# Patient Record
Sex: Male | Born: 1935 | Race: White | Hispanic: No | Smoking: Former smoker
Health system: Southern US, Community
[De-identification: ages and names within clinical notes are randomized; demographics above are authoritative.]

## PROBLEM LIST (undated history)

## (undated) DIAGNOSIS — H3530 Unspecified macular degeneration: Secondary | ICD-10-CM

## (undated) DIAGNOSIS — N189 Chronic kidney disease, unspecified: Secondary | ICD-10-CM

## (undated) DIAGNOSIS — N400 Enlarged prostate without lower urinary tract symptoms: Secondary | ICD-10-CM

## (undated) DIAGNOSIS — J449 Chronic obstructive pulmonary disease, unspecified: Secondary | ICD-10-CM

## (undated) DIAGNOSIS — M889 Osteitis deformans of unspecified bone: Secondary | ICD-10-CM

## (undated) DIAGNOSIS — M069 Rheumatoid arthritis, unspecified: Secondary | ICD-10-CM

## (undated) DIAGNOSIS — G459 Transient cerebral ischemic attack, unspecified: Secondary | ICD-10-CM

## (undated) DIAGNOSIS — G629 Polyneuropathy, unspecified: Secondary | ICD-10-CM

## (undated) DIAGNOSIS — I7100 Dissection of unspecified site of aorta: Secondary | ICD-10-CM

## (undated) DIAGNOSIS — M1990 Unspecified osteoarthritis, unspecified site: Secondary | ICD-10-CM

## (undated) DIAGNOSIS — I4821 Permanent atrial fibrillation: Secondary | ICD-10-CM

## (undated) DIAGNOSIS — E785 Hyperlipidemia, unspecified: Secondary | ICD-10-CM

## (undated) DIAGNOSIS — D469 Myelodysplastic syndrome, unspecified: Secondary | ICD-10-CM

## (undated) DIAGNOSIS — M5030 Other cervical disc degeneration, unspecified cervical region: Secondary | ICD-10-CM

## (undated) DIAGNOSIS — I10 Essential (primary) hypertension: Secondary | ICD-10-CM

## (undated) DIAGNOSIS — H35039 Hypertensive retinopathy, unspecified eye: Secondary | ICD-10-CM

## (undated) DIAGNOSIS — K3189 Other diseases of stomach and duodenum: Secondary | ICD-10-CM

## (undated) DIAGNOSIS — K219 Gastro-esophageal reflux disease without esophagitis: Secondary | ICD-10-CM

## (undated) DIAGNOSIS — I719 Aortic aneurysm of unspecified site, without rupture: Secondary | ICD-10-CM

## (undated) DIAGNOSIS — I82409 Acute embolism and thrombosis of unspecified deep veins of unspecified lower extremity: Secondary | ICD-10-CM

## (undated) DIAGNOSIS — H3410 Central retinal artery occlusion, unspecified eye: Secondary | ICD-10-CM

## (undated) DIAGNOSIS — R760 Raised antibody titer: Secondary | ICD-10-CM

## (undated) DIAGNOSIS — D649 Anemia, unspecified: Secondary | ICD-10-CM

## (undated) DIAGNOSIS — E039 Hypothyroidism, unspecified: Secondary | ICD-10-CM

## (undated) DIAGNOSIS — M109 Gout, unspecified: Secondary | ICD-10-CM

## (undated) DIAGNOSIS — R001 Bradycardia, unspecified: Secondary | ICD-10-CM

## (undated) HISTORY — DX: Hypothyroidism, unspecified: E03.9

## (undated) HISTORY — PX: EYE SURGERY: SHX253

## (undated) HISTORY — DX: Unspecified macular degeneration: H35.30

## (undated) HISTORY — DX: Myelodysplastic syndrome, unspecified: D46.9

## (undated) HISTORY — DX: Essential (primary) hypertension: I10

## (undated) HISTORY — PX: BACK SURGERY: SHX140

## (undated) HISTORY — DX: Transient cerebral ischemic attack, unspecified: G45.9

## (undated) HISTORY — DX: Other diseases of stomach and duodenum: K31.89

## (undated) HISTORY — DX: Chronic kidney disease, unspecified: N18.9

## (undated) HISTORY — DX: Bradycardia, unspecified: R00.1

## (undated) HISTORY — DX: Unspecified osteoarthritis, unspecified site: M19.90

## (undated) HISTORY — DX: Osteitis deformans of unspecified bone: M88.9

## (undated) HISTORY — DX: Anemia, unspecified: D64.9

## (undated) HISTORY — DX: Dissection of unspecified site of aorta: I71.00

## (undated) HISTORY — DX: Gout, unspecified: M10.9

## (undated) HISTORY — DX: Aortic aneurysm of unspecified site, without rupture: I71.9

## (undated) HISTORY — DX: Hyperlipidemia, unspecified: E78.5

## (undated) HISTORY — DX: Acute embolism and thrombosis of unspecified deep veins of unspecified lower extremity: I82.409

## (undated) HISTORY — DX: Gastro-esophageal reflux disease without esophagitis: K21.9

## (undated) HISTORY — DX: Polyneuropathy, unspecified: G62.9

## (undated) HISTORY — DX: Other cervical disc degeneration, unspecified cervical region: M50.30

## (undated) HISTORY — DX: Benign prostatic hyperplasia without lower urinary tract symptoms: N40.0

## (undated) HISTORY — PX: LUNG DECORTICATION: SHX454

## (undated) HISTORY — DX: Central retinal artery occlusion, unspecified eye: H34.10

## (undated) HISTORY — DX: Chronic obstructive pulmonary disease, unspecified: J44.9

## (undated) HISTORY — PX: CAROTID STENT: SHX1301

## (undated) HISTORY — PX: CAROTID ENDARTERECTOMY: SUR193

## (undated) HISTORY — DX: Permanent atrial fibrillation: I48.21

## (undated) HISTORY — DX: Rheumatoid arthritis, unspecified: M06.9

## (undated) HISTORY — DX: Raised antibody titer: R76.0

## (undated) HISTORY — DX: Hypertensive retinopathy, unspecified eye: H35.039

---

## 1988-09-07 HISTORY — PX: CAROTID ENDARTERECTOMY: SUR193

## 2002-09-07 HISTORY — PX: PACEMAKER IMPLANT: EP1218

## 2008-09-07 HISTORY — PX: EYE SURGERY: SHX253

## 2008-09-07 HISTORY — PX: CATARACT EXTRACTION: SUR2

## 2011-12-03 HISTORY — PX: PACEMAKER GENERATOR CHANGE: SHX5998

## 2014-09-23 HISTORY — PX: ATRIAL FIBRILLATION ABLATION: EP1191

## 2018-07-27 DIAGNOSIS — I482 Chronic atrial fibrillation, unspecified: Secondary | ICD-10-CM | POA: Insufficient documentation

## 2018-07-27 DIAGNOSIS — I6529 Occlusion and stenosis of unspecified carotid artery: Secondary | ICD-10-CM | POA: Insufficient documentation

## 2018-07-27 DIAGNOSIS — M069 Rheumatoid arthritis, unspecified: Secondary | ICD-10-CM | POA: Insufficient documentation

## 2018-07-27 DIAGNOSIS — N400 Enlarged prostate without lower urinary tract symptoms: Secondary | ICD-10-CM | POA: Insufficient documentation

## 2018-07-27 DIAGNOSIS — I480 Paroxysmal atrial fibrillation: Secondary | ICD-10-CM | POA: Insufficient documentation

## 2018-07-27 DIAGNOSIS — E039 Hypothyroidism, unspecified: Secondary | ICD-10-CM | POA: Insufficient documentation

## 2018-07-27 DIAGNOSIS — M109 Gout, unspecified: Secondary | ICD-10-CM | POA: Insufficient documentation

## 2018-07-27 DIAGNOSIS — E785 Hyperlipidemia, unspecified: Secondary | ICD-10-CM | POA: Insufficient documentation

## 2018-07-27 DIAGNOSIS — H3530 Unspecified macular degeneration: Secondary | ICD-10-CM | POA: Insufficient documentation

## 2018-07-27 DIAGNOSIS — D649 Anemia, unspecified: Secondary | ICD-10-CM | POA: Insufficient documentation

## 2018-07-27 DIAGNOSIS — G629 Polyneuropathy, unspecified: Secondary | ICD-10-CM | POA: Insufficient documentation

## 2018-07-27 DIAGNOSIS — I10 Essential (primary) hypertension: Secondary | ICD-10-CM | POA: Insufficient documentation

## 2018-07-28 DIAGNOSIS — N1831 Chronic kidney disease, stage 3a: Secondary | ICD-10-CM | POA: Insufficient documentation

## 2018-07-28 DIAGNOSIS — N189 Chronic kidney disease, unspecified: Secondary | ICD-10-CM | POA: Insufficient documentation

## 2018-07-28 DIAGNOSIS — N184 Chronic kidney disease, stage 4 (severe): Secondary | ICD-10-CM | POA: Insufficient documentation

## 2018-07-29 ENCOUNTER — Other Ambulatory Visit: Payer: Self-pay | Admitting: Internal Medicine

## 2018-07-29 DIAGNOSIS — I712 Thoracic aortic aneurysm, without rupture, unspecified: Secondary | ICD-10-CM

## 2018-08-01 ENCOUNTER — Telehealth: Payer: Self-pay

## 2018-08-01 NOTE — Telephone Encounter (Signed)
Left message for 13-hour prep.  Prednisone 50mg  PO 08/09/18 @ 0001, 0600 and 1200.  Benadryl 50mg  PO 08/09/18 @ 1200.  LMOM for patient to let him know Rx called in.  Gypsy Lore, RN

## 2018-08-08 ENCOUNTER — Telehealth: Payer: Self-pay | Admitting: Nurse Practitioner

## 2018-08-08 NOTE — Telephone Encounter (Signed)
Phone call from patient, he is unaware of 13 hr prep instructions, did not receive voicemail from [place], [place] on 11/25 and reports the prescription is not at his pharmacy. Instructions for 13 hr prep reviewed with patient and Rx re-called into [facility] on [address]. Pt verbalized understanding of instructions documented in Jeanne's previous note.

## 2018-08-09 ENCOUNTER — Ambulatory Visit
Admission: RE | Admit: 2018-08-09 | Discharge: 2018-08-09 | Disposition: A | Discharge status: Home / Self Care | Payer: Medicare Other | Source: Ambulatory Visit | Attending: Internal Medicine | Admitting: Internal Medicine

## 2018-08-09 ENCOUNTER — Encounter: Payer: Self-pay | Admitting: Cardiology

## 2018-08-09 ENCOUNTER — Other Ambulatory Visit: Payer: Self-pay | Admitting: Internal Medicine

## 2018-08-09 DIAGNOSIS — I712 Thoracic aortic aneurysm, without rupture, unspecified: Secondary | ICD-10-CM

## 2018-08-09 MED ORDER — IOPAMIDOL (ISOVUE-370) INJECTION 76%
50.0000 mL | Freq: Once | INTRAVENOUS | Status: AC | PRN
  Administered 2018-08-09: 50 mL via INTRAVENOUS

## 2018-08-10 DIAGNOSIS — I2510 Atherosclerotic heart disease of native coronary artery without angina pectoris: Secondary | ICD-10-CM | POA: Insufficient documentation

## 2018-08-26 DIAGNOSIS — C4441 Basal cell carcinoma of skin of scalp and neck: Secondary | ICD-10-CM | POA: Insufficient documentation

## 2018-08-29 ENCOUNTER — Encounter: Payer: Self-pay | Admitting: Internal Medicine

## 2018-08-29 ENCOUNTER — Ambulatory Visit (INDEPENDENT_AMBULATORY_CARE_PROVIDER_SITE_OTHER): Payer: Medicare Other | Admitting: Internal Medicine

## 2018-08-29 VITALS — BP 124/86 | HR 71 | Ht 73.0 in | Wt 191.0 lb

## 2018-08-29 DIAGNOSIS — Z950 Presence of cardiac pacemaker: Secondary | ICD-10-CM

## 2018-08-29 DIAGNOSIS — I4821 Permanent atrial fibrillation: Secondary | ICD-10-CM | POA: Diagnosis not present

## 2018-08-29 DIAGNOSIS — I10 Essential (primary) hypertension: Secondary | ICD-10-CM

## 2018-08-29 DIAGNOSIS — I340 Nonrheumatic mitral (valve) insufficiency: Secondary | ICD-10-CM

## 2018-08-29 DIAGNOSIS — R001 Bradycardia, unspecified: Secondary | ICD-10-CM

## 2018-08-29 LAB — CUP PACEART INCLINIC DEVICE CHECK
Battery Impedance: 1382 Ohm
Battery Remaining Longevity: 53 mo
Battery Voltage: 2.76 V
Brady Statistic RV Percent Paced: 5 %
Date Time Interrogation Session: 20191223143121
Implantable Lead Implant Date: 20041008
Implantable Lead Location: 753859
Implantable Lead Location: 753860
Implantable Lead Model: 5076
Implantable Lead Model: 5076
Implantable Pulse Generator Implant Date: 20130328
Lead Channel Impedance Value: 67 Ohm
Lead Channel Impedance Value: 786 Ohm
Lead Channel Setting Pacing Amplitude: 2.5 V
Lead Channel Setting Pacing Pulse Width: 1 ms
Lead Channel Setting Sensing Sensitivity: 4 mV
MDC IDC LEAD IMPLANT DT: 20041008
MDC IDC MSMT LEADCHNL RV PACING THRESHOLD AMPLITUDE: 1.25 V
MDC IDC MSMT LEADCHNL RV PACING THRESHOLD PULSEWIDTH: 1 ms
MDC IDC MSMT LEADCHNL RV SENSING INTR AMPL: 11.2 mV

## 2018-08-29 NOTE — Patient Instructions (Addendum)
Medication Instructions:  Your physician recommends that you continue on your current medications as directed. Please refer to the Current Medication list given to you today.  Phone number for the device clinic if you any questions is [phone]  Labwork: None ordered.  Testing/Procedures: Your physician has requested that you have an echocardiogram. Echocardiography is a painless test that uses sound waves to create images of your heart. It provides your doctor with information about the size and shape of your heart and how well your heart's chambers and valves are working. This procedure takes approximately one hour. There are no restrictions for this procedure.  Please schedule for ECHO   Follow-Up: Your physician wants you to follow-up in: 6 months with Tommye Standard, PA.   You will receive a reminder letter in the mail two months in advance. If you don't receive a letter, please call our office to schedule the follow-up appointment.  Remote monitoring is used to monitor your Pacemaker from home. This monitoring reduces the number of office visits required to check your device to one time per year. It allows [place] to keep an eye on the functioning of your device to ensure it is working properly. You are scheduled for a device check from home on 11/28/2018. You may send your transmission at any time that day. If you have a wireless device, the transmission will be sent automatically. After your physician reviews your transmission, you will receive a postcard with your next transmission date.  Any Other Special Instructions Will Be Listed Below (If Applicable).  If you need a refill on your cardiac medications before your next appointment, please call your pharmacy.

## 2018-08-29 NOTE — Progress Notes (Signed)
Steven Redwood, MD: Primary EP:  Dr Encarnacion Chu ([facility])  Steven Williams is a 84 y.o. male with a h/o bradycardia sp PPM (MDT) by Dr Encarnacion Chu who presents today to establish care in the Electrophysiology device clinic.  He has permanent afib with symptomatic bradycardia.  He has rare palpitations.  He is on coumadin.  Walks with a cane.  Rare dizziness. The patient reports doing very well since having a pacemaker implanted and remains very active despite his age.   Today, he  denies symptoms of palpitations, chest pain, shortness of breath, orthopnea, PND, lower extremity edema, dizziness, presyncope, syncope, or neurologic sequela.  The patientis tolerating medications without difficulties and is otherwise without complaint today.   Past Medical History:  Diagnosis Date  . Antiphospholipid antibody positive   . Aortic aneurysm ([place])   . Central retinal artery occlusion   . Chronic anemia   . Chronic renal insufficiency   . COPD (chronic obstructive pulmonary disease) ([place])   . DDD (degenerative disc disease), cervical   . DJD (degenerative joint disease)   . DVT (deep venous thrombosis) ([place])   . Gastric dysmotility   . GERD (gastroesophageal reflux disease)   . Gout   . HTN (hypertension)   . Hyperlipidemia   . Myelodysplasia (myelodysplastic syndrome) ([place])   . Paget's disease of bone   . Permanent atrial fibrillation   . Rheumatoid arthritis ([place])   . Symptomatic bradycardia   . TIA (transient ischemic attack)    Past Surgical History:  Procedure Laterality Date  . ATRIAL FIBRILLATION ABLATION  09/23/2014   Dr Encarnacion Chu in [place]  . BACK SURGERY    . CAROTID ENDARTERECTOMY    . CAROTID STENT    . LUNG DECORTICATION     VATS  . PACEMAKER GENERATOR CHANGE  12/03/2011   MDT Adapta ADDR01 generator change by Dr Encarnacion Chu for symptomatic bradycardia  . PACEMAKER IMPLANT  2004   MDT     Social History   Socioeconomic History  .  Marital status: Widowed    Spouse name: Not on file  . Number of children: Not on file  . Years of education: Not on file  . Highest education level: Not on file  Occupational History  . Not on file  Social Needs  . Financial resource strain: Not on file  . Food insecurity:    Worry: Not on file    Inability: Not on file  . Transportation needs:    Medical: Not on file    Non-medical: Not on file  Tobacco Use  . Smoking status: Former Smoker    Types: Cigarettes    Last attempt to quit: 1961    Years since quitting: 59.0  . Smokeless tobacco: Never Used  Substance and Sexual Activity  . Alcohol use: Not on file  . Drug use: Not on file  . Sexual activity: Not on file  Lifestyle  . Physical activity:    Days per week: Not on file    Minutes per session: Not on file  . Stress: Not on file  Relationships  . Social connections:    Talks on phone: Not on file    Gets together: Not on file    Attends religious service: Not on file    Active member of club or organization: Not on file    Attends meetings of clubs or organizations: Not on file    Relationship status: Not on file  . Intimate partner  violence:    Fear of current or ex partner: Not on file    Emotionally abused: Not on file    Physically abused: Not on file    Forced sexual activity: Not on file  Other Topics Concern  . Not on file  Social History Narrative   Previously lived in [place]   Now lives in [place] ([place])   Retired Chief Financial Officer    History reviewed. No pertinent family history.  Allergies  Allergen Reactions  . Iodinated Diagnostic Agents Rash    Current Outpatient Medications  Medication Sig Dispense Refill  . allopurinol (ZYLOPRIM) 300 MG tablet Take 300 mg by mouth daily.    . carvedilol (COREG) 25 MG tablet Take 25 mg by mouth 2 (two) times daily with a meal.    . Certolizumab Pegol (CIMZIA [place]) Inject into the skin every 30 (thirty) days. Patient is unsure of dose    .  Cholecalciferol (VITAMIN D3) 50 MCG (2000 UT) TABS Take 1 tablet by mouth 2 (two) times daily.    . digoxin (LANOXIN) 0.125 MG tablet Take 0.125 mg by mouth as directed. Patient takes medication on M/W/F    . Ferrous Sulfate (IRON) 325 (65 Fe) MG TABS Take 1 tablet by mouth 2 (two) times daily.    . furosemide (LASIX) 40 MG tablet Take 40 mg by mouth daily.    Marland Kitchen gabapentin (NEURONTIN) 400 MG capsule Take 400 mg by mouth at bedtime.    Marland Kitchen levothyroxine (SYNTHROID, LEVOTHROID) 88 MCG tablet Take 88 mcg by mouth daily before breakfast.    . Misc Natural Products (GLUCOSAMINE CHOND COMPLEX/MSM PO) Take 1 tablet by mouth daily.    . Multiple Vitamins-Minerals (PRESERVISION AREDS PO) Take 1 tablet by mouth 2 (two) times daily.    . Ranibizumab (LUCENTIS IZ) by Intravitreal route every 30 (thirty) days. Patient is unsure of dose    . rosuvastatin (CRESTOR) 10 MG tablet Take 10 mg by mouth daily.    . saw palmetto 500 MG capsule Take 500 mg by mouth 2 (two) times daily.    . tamsulosin (FLOMAX) 0.4 MG CAPS capsule Take 0.4 mg by mouth daily.    . temazepam (RESTORIL) 15 MG capsule Take 15 mg by mouth at bedtime as needed for sleep.    . Turmeric 500 MG CAPS Take 1 capsule by mouth daily.    Marland Kitchen warfarin (COUMADIN) 2 MG tablet Take 2 mg by mouth daily.     No current facility-administered medications for this visit.     ROS- all systems are reviewed and negative except as per HPI  Physical Exam: Vitals:   08/29/18 1158  BP: 124/86  Pulse: 71  SpO2: 99%  Weight: 191 lb (86.6 kg)  Height: 6\' 1"  (1.854 m)    GEN- The patient is elderly appearing, alert and oriented x 3 today.   Head- normocephalic, atraumatic Eyes-  Sclera clear, conjunctiva pink Ears- hearing intact Oropharynx- clear Neck- supple,   Lungs- Clear to ausculation bilaterally, normal work of breathing Chest- pacemaker pocket is well healed Heart- Regular rate and rhythm, 3/6 SEM at the LLSB, 3/6 holosystolic murmur at the  apex GI- soft, NT, ND, + BS Extremities- no clubbing, cyanosis, or edema MS- age appropriate atrophy Skin- no rash or lesion Psych- euthymic mood, full affect Neuro- strength and sensation are intact  Pacemaker interrogation- reviewed in detail today,  See PACEART report  Assessment and Plan:   1. Symptomatic bradycardia Normal pacemaker function See Claudia Desanctis Art report  He is programmed VVI 55 bpm RV output is chronically programmed 2.5V@1  msec due to chronically elevated threshold (1.25V@1msec  today) He V paces 5.2% RV lead polarity switch is turned to adaptive today.  No other changes  2. Permanent afib Rate controlled, with occasional elevation No changes today chads2vasc score is at least 6.  He is on coumadin.  INRs followed now by Dr Brigitte Pulse He also has prior antiphospholipid ab syndrome and DVT  3. HTN Stable No change required today  4. Thoracic/ abdominal aneurysm He has plans to see Dr Roxan Hockey (appointment already made)  5. MR murmur Will order echo to further evaluate  [facility] Return to see EP PA in 6 months  Thompson Grayer MD, [place] 08/29/2018 12:28 PM

## 2018-09-05 ENCOUNTER — Ambulatory Visit (HOSPITAL_COMMUNITY): Payer: Medicare Other | Attending: Internal Medicine

## 2018-09-09 ENCOUNTER — Encounter: Payer: Self-pay | Admitting: Cardiology

## 2018-09-12 ENCOUNTER — Ambulatory Visit (HOSPITAL_COMMUNITY): Payer: Medicare Other | Attending: Cardiology

## 2018-09-12 ENCOUNTER — Other Ambulatory Visit: Payer: Self-pay

## 2018-09-12 DIAGNOSIS — I340 Nonrheumatic mitral (valve) insufficiency: Secondary | ICD-10-CM | POA: Insufficient documentation

## 2018-09-13 ENCOUNTER — Encounter: Payer: Self-pay | Admitting: Thoracic Surgery (Cardiothoracic Vascular Surgery)

## 2018-09-13 ENCOUNTER — Other Ambulatory Visit: Payer: Self-pay

## 2018-09-13 ENCOUNTER — Institutional Professional Consult (permissible substitution) (INDEPENDENT_AMBULATORY_CARE_PROVIDER_SITE_OTHER): Payer: Medicare Other | Admitting: Thoracic Surgery (Cardiothoracic Vascular Surgery)

## 2018-09-13 VITALS — BP 146/75 | HR 65 | Resp 16 | Ht 73.0 in | Wt 191.0 lb

## 2018-09-13 DIAGNOSIS — I712 Thoracic aortic aneurysm, without rupture: Secondary | ICD-10-CM

## 2018-09-13 DIAGNOSIS — Z8679 Personal history of other diseases of the circulatory system: Secondary | ICD-10-CM | POA: Diagnosis not present

## 2018-09-13 DIAGNOSIS — I719 Aortic aneurysm of unspecified site, without rupture: Secondary | ICD-10-CM | POA: Diagnosis not present

## 2018-09-13 DIAGNOSIS — I7121 Aneurysm of the ascending aorta, without rupture: Secondary | ICD-10-CM

## 2018-09-13 NOTE — Progress Notes (Signed)
PCP is [name], MD Referring Provider is [name], MD  Chief Complaint  Patient presents with  . Thoracic Aortic Aneurysm    Surgical eval, CTA Chest 08/09/18, ECHO 09/12/17    HPI: Mr. Gartin is sent for consultation regarding a thoracic aortic aneurysm.  Letcher Schweikert is an 85 year old gentleman with an extensive past medical history as noted below.  He has a history of hypertension, hyperlipidemia, type B aortic "dissection," descending thoracic aortic aneurysm, aortic atherosclerosis, bradycardia requiring pacemaker placement, permanent atrial fibrillation, as well as numerous other medical issues.  He recently moved to [place] from [place].  He says he was told he had a dissection about 8 years ago.  He was followed since that time.  He says he was originally followed every 6 months but more recently it is been about once a year.  He said he was told it had originally been about 4.4 cm but had been stable at about 4.8 cm for the last 3 years.  He does not remember the events around the time he was diagnosed.  He gets short of breath with exertion.  He says he can walk about a block without a cane but can walk much further than that if he uses a cane.  He has chronic upper and lower back pain.  He thinks the upper back pains mostly related to when he is bending over working on a computer.  He is not having any chest pain, pressure, or tightness with exertion.   Past Medical History:  Diagnosis Date  . Antiphospholipid antibody positive   . Aortic aneurysm ([place])   . Aortic dissection ([place])   . Central retinal artery occlusion   . Chronic anemia   . Chronic renal insufficiency   . COPD (chronic obstructive pulmonary disease) ([place])   . DDD (degenerative disc disease), cervical   . DJD (degenerative joint disease)   . DVT (deep venous thrombosis) ([place])   . Gastric dysmotility   . GERD (gastroesophageal reflux disease)   . Gout   . HTN (hypertension)   .  Hyperlipidemia   . Myelodysplasia (myelodysplastic syndrome) ([place])   . Paget's disease of bone   . Permanent atrial fibrillation   . Rheumatoid arthritis ([place])   . Symptomatic bradycardia   . TIA (transient ischemic attack)     Past Surgical History:  Procedure Laterality Date  . ATRIAL FIBRILLATION ABLATION  09/23/2014   Dr Encarnacion Chu in [place]  . BACK SURGERY    . CAROTID ENDARTERECTOMY    . CAROTID STENT    . LUNG DECORTICATION     VATS  . PACEMAKER GENERATOR CHANGE  12/03/2011   MDT Adapta ADDR01 generator change by Dr Encarnacion Chu for symptomatic bradycardia  . PACEMAKER IMPLANT  2004   MDT     No family history on file.  Social History Social History   Tobacco Use  . Smoking status: Former Smoker    Types: Cigarettes    Last attempt to quit: 1961    Years since quitting: 59.0  . Smokeless tobacco: Never Used  Substance Use Topics  . Alcohol use: Not on file  . Drug use: Not on file    Current Outpatient Medications  Medication Sig Dispense Refill  . allopurinol (ZYLOPRIM) 300 MG tablet Take 300 mg by mouth daily.    . carvedilol (COREG) 25 MG tablet Take 25 mg by mouth 2 (two) times daily with a meal.    . Certolizumab Pegol (CIMZIA [place]) Inject into  the skin every 30 (thirty) days. Patient is unsure of dose    . Cholecalciferol (VITAMIN D3) 50 MCG (2000 UT) TABS Take 1 tablet by mouth 2 (two) times daily.    . digoxin (LANOXIN) 0.125 MG tablet Take 0.125 mg by mouth as directed. Patient takes medication on M/W/F    . Ferrous Sulfate (IRON) 325 (65 Fe) MG TABS Take 1 tablet by mouth 2 (two) times daily.    . furosemide (LASIX) 40 MG tablet Take 40 mg by mouth daily.    Marland Kitchen gabapentin (NEURONTIN) 400 MG capsule Take 400 mg by mouth at bedtime.    Marland Kitchen levothyroxine (SYNTHROID, LEVOTHROID) 88 MCG tablet Take 88 mcg by mouth daily before breakfast.    . Misc Natural Products (GLUCOSAMINE CHOND COMPLEX/MSM PO) Take 1 tablet by mouth daily.    . Multiple Vitamins-Minerals  (PRESERVISION AREDS PO) Take 1 tablet by mouth 2 (two) times daily.    . Ranibizumab (LUCENTIS IZ) by Intravitreal route every 30 (thirty) days. Patient is unsure of dose    . rosuvastatin (CRESTOR) 10 MG tablet Take 10 mg by mouth daily.    . saw palmetto 500 MG capsule Take 500 mg by mouth 2 (two) times daily.    . tamsulosin (FLOMAX) 0.4 MG CAPS capsule Take 0.4 mg by mouth daily.    . temazepam (RESTORIL) 15 MG capsule Take 15 mg by mouth at bedtime as needed for sleep.    . Turmeric 500 MG CAPS Take 1 capsule by mouth daily.    Marland Kitchen warfarin (COUMADIN) 2 MG tablet Take 2 mg by mouth daily.     No current facility-administered medications for this visit.     Allergies  Allergen Reactions  . Iodinated Diagnostic Agents Rash    Review of Systems  Constitutional: Negative for activity change and unexpected weight change.  HENT: Positive for hearing loss and trouble swallowing. Negative for voice change.   Respiratory: Positive for shortness of breath. Negative for cough and wheezing.   Cardiovascular: Negative for leg swelling.       Poor circulation in the feet  Genitourinary: Positive for difficulty urinating and frequency.  Musculoskeletal: Positive for arthralgias and joint swelling.  Hematological: Bruises/bleeds easily.  All other systems reviewed and are negative.   BP (!) 146/75 (BP Location: Left Arm, Patient Position: Sitting, Cuff Size: Large)   Pulse 65   Resp 16   Ht 6\' 1"  (1.854 m)   Wt 191 lb (86.6 kg)   SpO2 (!) 89% Comment: RA  BMI 25.20 kg/m  Physical Exam Vitals signs reviewed.  Constitutional:      General: He is not in acute distress.    Comments: Elderly  HENT:     Head: Normocephalic.     Comments: Multiple bandages in place following skin biopsies Eyes:     General: No scleral icterus.    Extraocular Movements: Extraocular movements intact.  Neck:     Musculoskeletal: Neck supple.  Cardiovascular:     Rate and Rhythm: Normal rate. Rhythm  irregular.     Heart sounds: Murmur (High-pitched systolic murmur heard throughout precordium) present. No gallop.   Pulmonary:     Effort: Pulmonary effort is normal. No respiratory distress.     Breath sounds: Normal breath sounds.  Abdominal:     General: There is no distension.     Palpations: Abdomen is soft.  Musculoskeletal:        General: No swelling.  Lymphadenopathy:     Cervical:  No cervical adenopathy.  Skin:    Comments: Fingers cyanotic with brisk capillary refill      Diagnostic Tests: CT ANGIOGRAPHY CHEST WITH CONTRAST  TECHNIQUE: Multidetector CT imaging of the chest was performed using the standard protocol during bolus administration of intravenous contrast. Multiplanar CT image reconstructions and MIPs were obtained to evaluate the vascular anatomy.  CONTRAST:  16mL ISOVUE-370 IOPAMIDOL (ISOVUE-370) INJECTION 76%  COMPARISON:  None.  FINDINGS: Cardiovascular: The aortic root measures 3.6-3.9 cm at the level of the sinuses of Valsalva. The ascending thoracic aorta measures 3.9 cm in greatest diameter. The proximal arch measures 3.7 cm. The distal arch measures 3.9 cm. The proximal descending thoracic aorta measures 4.2 cm. At the level of the mid descending thoracic aorta, aneurysmal disease is present with maximum diameter of approximately 4.7-4.8 cm. Along the left lateral wall of the aorta at this level, a penetrating ulcer is present. Most of the ulcerated cavity contains thrombus and is partially calcified. Protuberant portion containing contrast and communicating with the aortic lumen measures roughly 2.1 cm in greatest transverse diameter and has a broad communication with the aortic lumen over a length of approximately 3.5 cm on the coronal reconstructions. The distal descending thoracic aorta measures 3.8 cm. The aorta at the diaphragmatic hiatus measures 3.9 x 4.1 cm.  No evidence of aortic dissection. Proximal great vessels  are normally patent. There is fusiform dilatation of the innominate artery measuring up to 2.1 cm in diameter. The heart is moderately enlarged. A dual-chamber pacemaker is present with leads at the level of the right atrial appendage and right ventricular apex. Mitral valve and annulus are heavily calcified. There is mild calcification at the level of the aortic valve. The coronary arteries are extensively calcified in a 3 vessel distribution. Central pulmonary arteries are normal in caliber.  Mediastinum/Nodes: No enlarged mediastinal, hilar, or axillary lymph nodes. Thyroid gland, trachea, and esophagus demonstrate no significant findings.  Lungs/Pleura: Scattered pulmonary scarring bilaterally. There is no evidence of pulmonary edema, consolidation, pneumothorax, nodule or pleural fluid.  Upper Abdomen: No acute abnormality. Probable 1.4 cm cyst in the left lobe of the liver and roughly 1.7 cm cyst in the posterior right lobe of the liver.  Musculoskeletal: No chest wall abnormality. No acute or significant osseous findings.  Review of the MIP images confirms the above findings.  IMPRESSION: 1. Complex aneurysmal disease of the thoracic aorta as described above. This primarily consists of what appears to be penetrating ulcer disease involving the mid descending thoracic aorta with an elongated segment measuring up to 4.7-4.8 cm in maximum diameter. The ascending thoracic aorta is top-normal in caliber measuring 3.9 cm. The proximal descending thoracic aorta shows mild aneurysmal disease and the aorta is also mildly dilated at the diaphragmatic hiatus. Correlation with prior imaging would be helpful in determining whether the aneurysmal disease is enlarging. Referral to cardiothoracic and vascular surgery would be indicated based on the current size of the descending thoracic aorta. 2. Extensive calcified coronary arterial plaque in a 3 vessel distribution. 3.  Cardiac enlargement and dual-chamber pacemaker placement. Heavily calcified mitral valve and annulus. Mildly calcified aortic valve. 4. Probable cysts of the right and left lobes of the liver.  Aortic aneurysm NOS (ICD10-I71.9).   Electronically Signed   By: Aletta Edouard M.D.   On: 08/09/2018 17:46 I personally reviewed the CT images and concur with the findings noted above.  Impression: Mr. Schwertner is an 85 year old gentleman with numerous medical problems who has a history  of a type II dissection with a descending thoracic aneurysm.  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  He was managed medically and has not had any issues related to that.  He has been followed on a regular basis in [place], [place] prior to moving to [place] a few months ago.  Reviewing his CT he does have a descending aneurysm.  I am not impressed with the size of his ascending aorta.  Tried around 4 cm in for somebody his age that is really close to the normal size.  The descending aneurysm is of greater concern, although by his report, it has not changed over several years.  There is no indication for the surgery at the current time but he would need continued follow-up.  He has a prominent heart murmur on exam but his echocardiogram did not show any significant valvular pathology.  He had a normal ejection fraction.  He does have significant cardiomegaly on his CT scan.  Hypertension-blood pressure is elevated.  He was anxious about his procedure today and just had multiple skin biopsies unable to make a lot of medication changes.  He should follow back up with Dr. Brigitte Pulse regarding that issue.  Plan: Return in 6 months with CT angiogram of chest to follow-up descending aneurysm  Melrose Nakayama, MD [facility] [phone]

## 2018-10-18 ENCOUNTER — Ambulatory Visit (INDEPENDENT_AMBULATORY_CARE_PROVIDER_SITE_OTHER): Payer: Medicare Other | Admitting: Podiatry

## 2018-10-18 ENCOUNTER — Other Ambulatory Visit: Payer: Self-pay | Admitting: Podiatry

## 2018-10-18 ENCOUNTER — Telehealth: Payer: Self-pay | Admitting: *Deleted

## 2018-10-18 ENCOUNTER — Ambulatory Visit (INDEPENDENT_AMBULATORY_CARE_PROVIDER_SITE_OTHER): Payer: Medicare Other

## 2018-10-18 DIAGNOSIS — Z7901 Long term (current) use of anticoagulants: Secondary | ICD-10-CM | POA: Diagnosis not present

## 2018-10-18 DIAGNOSIS — M79674 Pain in right toe(s): Secondary | ICD-10-CM | POA: Diagnosis not present

## 2018-10-18 DIAGNOSIS — M79672 Pain in left foot: Secondary | ICD-10-CM

## 2018-10-18 DIAGNOSIS — M79671 Pain in right foot: Secondary | ICD-10-CM

## 2018-10-18 DIAGNOSIS — M722 Plantar fascial fibromatosis: Secondary | ICD-10-CM

## 2018-10-18 DIAGNOSIS — M216X9 Other acquired deformities of unspecified foot: Secondary | ICD-10-CM | POA: Diagnosis not present

## 2018-10-18 DIAGNOSIS — B351 Tinea unguium: Secondary | ICD-10-CM | POA: Diagnosis not present

## 2018-10-18 DIAGNOSIS — R0989 Other specified symptoms and signs involving the circulatory and respiratory systems: Secondary | ICD-10-CM

## 2018-10-18 DIAGNOSIS — Q828 Other specified congenital malformations of skin: Secondary | ICD-10-CM

## 2018-10-18 DIAGNOSIS — I7090 Unspecified atherosclerosis: Secondary | ICD-10-CM | POA: Diagnosis not present

## 2018-10-18 DIAGNOSIS — I739 Peripheral vascular disease, unspecified: Secondary | ICD-10-CM

## 2018-10-18 DIAGNOSIS — M79675 Pain in left toe(s): Secondary | ICD-10-CM

## 2018-10-18 NOTE — Telephone Encounter (Signed)
-----   Message from Trula Slade, DPM sent at 10/18/2018  2:14 PM EST ----- Can you please schedule arterial studies due to decreased pulses?

## 2018-10-18 NOTE — Telephone Encounter (Signed)
Faxed orders to [facility]. 

## 2018-10-20 NOTE — Progress Notes (Signed)
Subjective:   Patient ID: Steven Williams, male   DOB: 85 y.o.   MRN: [id number]   HPI 86 year old male presents the office today for concerns of pain mostly to his right leg.  States that he gets pain on a callus as well as the arch of the foot mostly at nighttime and he states the pain his foot off the side of the bed eliminates the pain.  He states the callus not painful with pressure the majority the time.  Denies any open sores.  He also has thick, elongated toenails he cannot trim himself and they are causing pain.  Denies any redness or drainage the toenail or callus sites.  He has no other concerns today.  He is on Coumadin.   Review of Systems  All other systems reviewed and are negative.  Past Medical History:  Diagnosis Date  . Antiphospholipid antibody positive   . Aortic aneurysm ([place])   . Aortic dissection ([place])   . Central retinal artery occlusion   . Chronic anemia   . Chronic renal insufficiency   . COPD (chronic obstructive pulmonary disease) ([place])   . DDD (degenerative disc disease), cervical   . DJD (degenerative joint disease)   . DVT (deep venous thrombosis) ([place])   . Gastric dysmotility   . GERD (gastroesophageal reflux disease)   . Gout   . HTN (hypertension)   . Hyperlipidemia   . Myelodysplasia (myelodysplastic syndrome) ([place])   . Paget's disease of bone   . Permanent atrial fibrillation   . Rheumatoid arthritis ([place])   . Symptomatic bradycardia   . TIA (transient ischemic attack)     Past Surgical History:  Procedure Laterality Date  . ATRIAL FIBRILLATION ABLATION  09/23/2014   Dr Encarnacion Chu in [place]  . BACK SURGERY    . CAROTID ENDARTERECTOMY    . CAROTID STENT    . LUNG DECORTICATION     VATS  . PACEMAKER GENERATOR CHANGE  12/03/2011   MDT Adapta ADDR01 generator change by Dr Encarnacion Chu for symptomatic bradycardia  . PACEMAKER IMPLANT  2004   MDT      Current Outpatient Medications:  .  allopurinol (ZYLOPRIM) 300 MG tablet, Take 300 mg by  mouth daily., Disp: , Rfl:  .  carvedilol (COREG) 25 MG tablet, Take 25 mg by mouth 2 (two) times daily with a meal., Disp: , Rfl:  .  Certolizumab Pegol (CIMZIA [place]), Inject into the skin every 30 (thirty) days. Patient is unsure of dose, Disp: , Rfl:  .  Cholecalciferol (VITAMIN D3) 50 MCG (2000 UT) TABS, Take 1 tablet by mouth 2 (two) times daily., Disp: , Rfl:  .  digoxin (LANOXIN) 0.125 MG tablet, Take 0.125 mg by mouth as directed. Patient takes medication on M/W/F, Disp: , Rfl:  .  Ferrous Sulfate (IRON) 325 (65 Fe) MG TABS, Take 1 tablet by mouth 2 (two) times daily., Disp: , Rfl:  .  furosemide (LASIX) 40 MG tablet, Take 40 mg by mouth daily., Disp: , Rfl:  .  gabapentin (NEURONTIN) 400 MG capsule, Take 400 mg by mouth at bedtime., Disp: , Rfl:  .  levothyroxine (SYNTHROID, LEVOTHROID) 88 MCG tablet, Take 88 mcg by mouth daily before breakfast., Disp: , Rfl:  .  Misc Natural Products (GLUCOSAMINE CHOND COMPLEX/MSM PO), Take 1 tablet by mouth daily., Disp: , Rfl:  .  Multiple Vitamins-Minerals (PRESERVISION AREDS PO), Take 1 tablet by mouth 2 (two) times daily., Disp: , Rfl:  .  Ranibizumab (LUCENTIS IZ), by  Intravitreal route every 30 (thirty) days. Patient is unsure of dose, Disp: , Rfl:  .  rosuvastatin (CRESTOR) 10 MG tablet, Take 10 mg by mouth daily., Disp: , Rfl:  .  saw palmetto 500 MG capsule, Take 500 mg by mouth 2 (two) times daily., Disp: , Rfl:  .  tamsulosin (FLOMAX) 0.4 MG CAPS capsule, Take 0.4 mg by mouth daily., Disp: , Rfl:  .  temazepam (RESTORIL) 15 MG capsule, Take 15 mg by mouth at bedtime as needed for sleep., Disp: , Rfl:  .  Turmeric 500 MG CAPS, Take 1 capsule by mouth daily., Disp: , Rfl:  .  warfarin (COUMADIN) 2 MG tablet, Take 2 mg by mouth daily., Disp: , Rfl:   Allergies  Allergen Reactions  . Iodinated Diagnostic Agents Rash         Objective:  Physical Exam  General: AAO x3, NAD  Dermatological: Nails are hypertrophic, dystrophic, brittle,  discolored, elongated 10. No surrounding redness or drainage. Tenderness nails 1-5 bilaterally.  Minimal hyperkeratotic tissue right foot submetatarsal 1.  Upon debridement there is no underlying ulceration drainage or any signs of infection.  No open lesions or pre-ulcerative lesions are identified today.  Vascular: Unable to palpate DP, PT pulses.  Neruologic: Sensation appears to be intact with Semmes-Weinstein monofilament.   Musculoskeletal: Prominence of the metatarsal heads plantarly.  Actually the fat pad.  Muscular strength 5/5 in all groups tested bilateral.     Assessment:   85 year old male with symptomatic, mycosis, hyperkeratotic lesions; PAD    Plan:  -Treatment options discussed including all alternatives, risks, and complications -Etiology of symptoms were discussed -X-rays were obtained and reviewed with the patient. Vessel calcification present but no evidence of acute fracture.  -Nails debrided 10 without complications or bleeding. -Hyperkeratotic lesion sharply debrided x 1 without any complication or bleeding.  -His symptoms are not consistent with a callus and is more consistent with PAD. Arterial studies ordered.  -Daily foot inspection -Follow-up in 3 months or sooner if any problems arise. In the meantime, encouraged to call the office with any questions, concerns, change in symptoms.   Celesta Gentile, DPM

## 2018-10-28 ENCOUNTER — Telehealth: Payer: Self-pay | Admitting: Podiatry

## 2018-10-28 NOTE — Telephone Encounter (Signed)
Pt was seen in office on 10/18/18 and had a referral sent in to have an appointment scheduled with a cardiologist. Patient would like to know if we can cancel the original referral and have it sent to his cardiologist, Dr. Thompson Grayer at [facility].   [address] [place], [place] [address]

## 2018-10-28 NOTE — Telephone Encounter (Signed)
Orders and demographics sent to [facility] - Dr. Thompson Grayer. Left message Daphane Shepherd - [facility] to cancel pt's 11/04/2018 studies appt. I spoke with Falecha - [facility] and she states [facility] is the same group the testing has been moved to [facility], and pt would still be able to see Dr. Rayann Heman if needed.

## 2018-10-28 NOTE — Telephone Encounter (Signed)
I informed pt Dr. Rayann Heman was in with [facility] doctors and the testing would be through the [facility] office, but if he needed to see a cardiologist he would still see Dr. Rayann Heman if he wanted. Pt states that is fine, keep it as it is.

## 2018-10-31 NOTE — Progress Notes (Signed)
[facility] Note  11/01/2018     CHIEF COMPLAINT Patient presents for Retina Evaluation   HISTORY OF PRESENT ILLNESS: Steven Williams is a 85 y.o. male who presents to the clinic today for:   HPI    Retina Evaluation    In left eye.  This started 10 years ago.  Duration of 4 months.  Associated Symptoms Negative for Flashes, Blind Spot, Photophobia, Scalp Tenderness, Fever, Floaters, Pain, Glare, Jaw Claudication, Weight Loss, Redness, Distortion, Trauma, Shoulder/Hip pain and Fatigue.  Context:  mid-range vision, near vision, watching TV, reading and distance vision.  Treatments tried include injection.  Response to treatment was no improvement.          Comments    Patient states he has been treated (lucentis) for wet ARMD OS for about 10 years. Last injection in OS was around beginning of October 2019 in [place], [place].Saw Dr. Baird Cancer here in [place] around November 2019-no treatment with Dr. Baird Cancer. Patient feels that vision is getting worse OS, even though no shots were recommended in November. Patient can only see out of OS, vision bad OD for the last 5-6 years.        Last edited by Roselee Nova D on 11/01/2018  1:21 PM. (History)    pt states he last saw his Dr. In [place], [place] in October, he states he was getting injections there every 4-6 weeks, he states when he moved here in November he saw Dr. Baird Cancer and was told that everything was fine and he didn't need any treatment, he states his vision has been getting worse in his left eye and a friend recommended he come here, pt states he was receiving injections in his right eye before he lost vision in it, he is now being treated in his left eye only, pt states he has a hard time seeing up close, he states if he sees someone he knows well, he cannot make out their features, he states he is unable to read the newspaper also  Referring physician: [name], MD [place],  [place] [id number]  HISTORICAL INFORMATION:   Selected notes from the [facility] Referred by [name], [place] LEE:  Ocular Hx- PMH-    CURRENT MEDICATIONS: Current Outpatient Medications (Ophthalmic Drugs)  Medication Sig  . Ranibizumab (LUCENTIS IZ) by Intravitreal route every 30 (thirty) days. Patient is unsure of dose   No current facility-administered medications for this visit.  (Ophthalmic Drugs)   Current Outpatient Medications (Other)  Medication Sig  . allopurinol (ZYLOPRIM) 300 MG tablet Take 300 mg by mouth daily.  . carvedilol (COREG) 25 MG tablet Take 25 mg by mouth 2 (two) times daily with a meal.  . Certolizumab Pegol (CIMZIA [place]) Inject into the skin every 30 (thirty) days. Patient is unsure of dose  . Cholecalciferol (VITAMIN D3) 50 MCG (2000 UT) TABS Take 1 tablet by mouth 2 (two) times daily.  . digoxin (LANOXIN) 0.125 MG tablet Take 0.125 mg by mouth as directed. Patient takes medication on M/W/F  . Ferrous Sulfate (IRON) 325 (65 Fe) MG TABS Take 1 tablet by mouth 2 (two) times daily.  . furosemide (LASIX) 40 MG tablet Take 40 mg by mouth daily.  Marland Kitchen gabapentin (NEURONTIN) 400 MG capsule Take 400 mg by mouth at bedtime.  Marland Kitchen levothyroxine (SYNTHROID, LEVOTHROID) 88 MCG tablet Take 88 mcg by mouth daily before breakfast.  . Misc Natural Products (GLUCOSAMINE CHOND COMPLEX/MSM PO) Take 1  tablet by mouth daily.  . Multiple Vitamins-Minerals (PRESERVISION AREDS PO) Take 1 tablet by mouth 2 (two) times daily.  . rosuvastatin (CRESTOR) 10 MG tablet Take 10 mg by mouth daily.  . saw palmetto 500 MG capsule Take 500 mg by mouth 2 (two) times daily.  . tamsulosin (FLOMAX) 0.4 MG CAPS capsule Take 0.4 mg by mouth daily.  . temazepam (RESTORIL) 15 MG capsule Take 15 mg by mouth at bedtime as needed for sleep.  . Turmeric 500 MG CAPS Take 1 capsule by mouth daily.  Marland Kitchen warfarin (COUMADIN) 2 MG tablet Take 2 mg by mouth daily.  . carvedilol (COREG) 6.25 MG tablet Take 6.25  mg by mouth 2 (two) times daily.  Marland Kitchen dicyclomine (BENTYL) 10 MG capsule Take 10 mg by mouth 4 (four) times daily.   Current Facility-Administered Medications (Other)  Medication Route  . Bevacizumab (AVASTIN) SOLN 1.25 mg Intravitreal      REVIEW OF SYSTEMS: ROS    Positive for: Musculoskeletal, Endocrine, Cardiovascular, Eyes   Negative for: Constitutional, Gastrointestinal, Neurological, Skin, Genitourinary, HENT, Respiratory, Psychiatric, Allergic/Imm, Heme/Lymph   Last edited by Roselee Nova D on 11/01/2018  1:09 PM. (History)       ALLERGIES Allergies  Allergen Reactions  . Iodinated Diagnostic Agents Rash    PAST MEDICAL HISTORY Past Medical History:  Diagnosis Date  . Antiphospholipid antibody positive   . Aortic aneurysm ([place])   . Aortic dissection ([place])   . Central retinal artery occlusion   . Chronic anemia   . Chronic renal insufficiency   . COPD (chronic obstructive pulmonary disease) ([place])   . DDD (degenerative disc disease), cervical   . DJD (degenerative joint disease)   . DVT (deep venous thrombosis) ([place])   . Gastric dysmotility   . GERD (gastroesophageal reflux disease)   . Gout   . HTN (hypertension)   . Hyperlipidemia   . Myelodysplasia (myelodysplastic syndrome) ([place])   . Paget's disease of bone   . Permanent atrial fibrillation   . Rheumatoid arthritis ([place])   . Symptomatic bradycardia   . TIA (transient ischemic attack)    Past Surgical History:  Procedure Laterality Date  . ATRIAL FIBRILLATION ABLATION  09/23/2014   Dr Encarnacion Chu in [place]  . BACK SURGERY    . CAROTID ENDARTERECTOMY    . CAROTID STENT    . CATARACT EXTRACTION Bilateral 2010   [place]  . EYE SURGERY    . LUNG DECORTICATION     VATS  . PACEMAKER GENERATOR CHANGE  12/03/2011   MDT Adapta ADDR01 generator change by Dr Encarnacion Chu for symptomatic bradycardia  . PACEMAKER IMPLANT  2004   MDT     FAMILY HISTORY History reviewed. No pertinent family history.  SOCIAL  HISTORY Social History   Tobacco Use  . Smoking status: Former Smoker    Types: Cigarettes    Last attempt to quit: 1961    Years since quitting: 59.1  . Smokeless tobacco: Never Used  Substance Use Topics  . Alcohol use: Yes    Alcohol/week: 2.0 standard drinks    Types: 2 Glasses of wine per week    Comment: daily  . Drug use: Never         OPHTHALMIC EXAM:  Base Eye Exam    Visual Acuity (Snellen - Linear)      Right Left   Dist cc CF at 3' 20/50 +2   Dist ph cc NI NI   Correction:  Glasses  Tonometry (Tonopen, 1:29 PM)      Right Left   Pressure 16 `17       Pupils      Dark Light Shape React APD   Right 2 1 Round Brisk +1   Left 2 1 Round Brisk None       Visual Fields      Left Right    Full    Restrictions  Central scotoma       Extraocular Movement      Right Left    Full, Ortho Full, Ortho       Neuro/Psych    Oriented x3:  Yes   Mood/Affect:  Normal       Dilation    Both eyes:  1.0% Mydriacyl, 2.5% Phenylephrine @ 1:29 PM        Slit Lamp and Fundus Exam    Slit Lamp Exam      Right Left   Lids/Lashes Dermatochalasis - upper lid, Meibomian gland dysfunction Dermatochalasis - upper lid, Meibomian gland dysfunction   Conjunctiva/Sclera White and quiet White and quiet   Cornea Arcus, 2-3+ Punctate epithelial erosions, Well healed cataract wounds Arcus, 3-4+ Punctate epithelial erosions centrally, Well healed cataract wounds   Anterior Chamber Deep and quiet Deep and quiet   Iris Round and dilated Round and poorly dilated to 4.5   Lens Posterior chamber intraocular lens, trace Posterior capsular opacification Posterior chamber intraocular lens, trace Posterior capsular opacification   Vitreous Vitreous syneresis, Posterior vitreous detachment Vitreous syneresis       Fundus Exam      Right Left   Disc +cupping, mild pallor, 360 PPA 360 PPA, +cupping   C/D Ratio 0.7 0.7   Macula Flat, diffuse RPE atrophy, central pigment  clumping, No heme or edema Flat, Blunted foveal reflex, Drusen, RPE mottling, clumping and atrophy, No heme or edema   Vessels Vascular attenuation Vascular attenuation   Periphery Attached, mild reticular degeneration Attached, mild reticular degeneration        Refraction    Wearing Rx      Sphere Cylinder Axis Add   Right -1.75 +2.25 005 +2.50   Left -1.50 +2.50 175 +2.50   Age:  4 yr   Type:  PAL       Manifest Refraction      Sphere Cylinder Axis Dist VA   Right       Left -1.25 +2.25 165 20/40+2          IMAGING AND PROCEDURES  Imaging and Procedures for @TODAY @  OCT, Retina - OU - Both Eyes       Right Eye Quality was good. Central Foveal Thickness: 297. Progression has no prior data. Findings include abnormal foveal contour, no IRF, no SRF, subretinal hyper-reflective material, retinal drusen , outer retinal atrophy, pigment epithelial detachment.   Left Eye Quality was good. Central Foveal Thickness: 282. Progression has no prior data. Findings include abnormal foveal contour, no IRF, no SRF, outer retinal atrophy, retinal drusen , pigment epithelial detachment.   Notes *Images captured and stored on drive  Diagnosis / Impression:  Non-exudative ARMD OU  Clinical management:  See below  Abbreviations: NFP - Normal foveal profile. CME - cystoid macular edema. PED - pigment epithelial detachment. IRF - intraretinal fluid. SRF - subretinal fluid. EZ - ellipsoid zone. ERM - epiretinal membrane. ORA - outer retinal atrophy. ORT - outer retinal tubulation. SRHM - subretinal hyper-reflective material        Intravitreal Injection, Pharmacologic  Agent - OS - Left Eye       Time Out 11/01/2018. 2:30 PM. Confirmed correct patient, procedure, site, and patient consented.   Anesthesia Topical anesthesia was used. Anesthetic medications included Lidocaine 2%, Proparacaine 0.5%.   Procedure Preparation included 5% betadine to ocular surface, eyelid speculum. A  supplied needle was used.   Injection:  1.25 mg Bevacizumab (AVASTIN) SOLN   NDC: 76160-737-10, Lot: [id number] , Expiration date: 12/28/2018   Route: Intravitreal, Site: Left Eye, Waste: 0 mg  Post-op Post injection exam found visual acuity of at least counting fingers. The patient tolerated the procedure well. There were no complications. The patient received written and verbal post procedure care education.                 ASSESSMENT/PLAN:    ICD-10-CM   1. Exudative age-related macular degeneration of left eye with active choroidal neovascularization (HCC) H35.3221 Intravitreal Injection, Pharmacologic Agent - OS - Left Eye    Bevacizumab (AVASTIN) SOLN 1.25 mg  2. Exudative age-related macular degeneration of right eye with inactive scar ([place]) H35.3213   3. Retinal edema H35.81 OCT, Retina - OU - Both Eyes  4. Essential hypertension I10   5. Hypertensive retinopathy of both eyes H35.033   6. Pseudophakia of both eyes Z96.1     1-3. 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    OD- w/ inactive scar -- history of low vision, CF 3'  OS- w/ CNV   - history of multiple IVL OS in [address], [place] w/ Dr. [place] -- last injection 06/2018, q4-6 wk interval  - moved to [place] in November 2019 and has been followed by Dr. December 2019 who has recommended observation, thus pt sought second opinion  - The incidence pathology and anatomy of wet AMD discussed   - The ANCHOR, MARINA, CATT and VIEW trials discussed with patient.    - discussed treatment options including observation vs intravitreal anti-VEGF agents such as Avastin, Lucentis, Eylea.    - Risks of endophthalmitis and vascular occlusive events and atrophic changes discussed with patient  - OCT shows PEDs, CNV w/ tr SRF OS; OD with inactive scar  - given functional monocular status and history of active CNV, recommend IVA #1 OS today for maintenance / prophylaxis  - pt wishes to be treated with IVA OS  - RBA of  procedure discussed, questions answered  - informed consent obtained and signed  - see procedure note  - f/u in 6 wks -- DFE, OCT  4,5. Hypertensive retinopathy OU - discussed importance of tight BP control - monitor  6. Pseudophakia OU  - s/p CE/IOL OU  - monitor   Ophthalmic Meds Ordered this visit:  Meds ordered this encounter  Medications  . Bevacizumab (AVASTIN) SOLN 1.25 mg       Return in about 6 weeks (around 12/13/2018).  There are no Patient Instructions on file for this visit.   Explained the diagnoses, plan, and follow up with the patient and they expressed understanding.  Patient expressed understanding of the importance of proper follow up care.   This document serves as a record of services personally performed by 17/03/2019, MD, PhD. It was created on their behalf by Gardiner Sleeper, OA, an ophthalmic assistant. The creation of this record is the provider's dictation and/or activities during the visit.    Electronically signed by: Ernest Mallick, OA  02.24.2020 12:06 AM    03.08.2020, M.D., Ph.D. Diseases & Surgery of the  Retina and Vitreous [facility]  I have reviewed the above documentation for accuracy and completeness, and I agree with the above. Gardiner Sleeper, M.D., Ph.D. 11/02/18 12:06 AM   Abbreviations: M myopia (nearsighted); A astigmatism; H hyperopia (farsighted); P presbyopia; Mrx spectacle prescription;  CTL contact lenses; OD right eye; OS left eye; OU both eyes  XT exotropia; ET esotropia; PEK punctate epithelial keratitis; PEE punctate epithelial erosions; DES dry eye syndrome; MGD meibomian gland dysfunction; ATs artificial tears; PFAT's preservative free artificial tears; [place] nuclear sclerotic cataract; PSC posterior subcapsular cataract; ERM epi-retinal membrane; PVD posterior vitreous detachment; RD retinal detachment; DM diabetes mellitus; DR diabetic retinopathy; NPDR non-proliferative diabetic retinopathy;  PDR proliferative diabetic retinopathy; CSME clinically significant macular edema; DME diabetic macular edema; dbh dot blot hemorrhages; CWS cotton wool spot; POAG primary open angle glaucoma; C/D cup-to-disc ratio; HVF humphrey visual field; GVF goldmann visual field; OCT optical coherence tomography; IOP intraocular pressure; BRVO Branch retinal vein occlusion; CRVO central retinal vein occlusion; CRAO central retinal artery occlusion; BRAO branch retinal artery occlusion; RT retinal tear; SB scleral buckle; PPV pars plana vitrectomy; VH Vitreous hemorrhage; PRP panretinal laser photocoagulation; IVK intravitreal kenalog; VMT vitreomacular traction; MH Macular hole;  NVD neovascularization of the disc; NVE neovascularization elsewhere; AREDS age related eye disease study; ARMD age related macular degeneration; POAG primary open angle glaucoma; EBMD epithelial/anterior basement membrane dystrophy; ACIOL anterior chamber intraocular lens; IOL intraocular lens; PCIOL posterior chamber intraocular lens; Phaco/IOL phacoemulsification with intraocular lens placement; [place] photorefractive keratectomy; LASIK laser assisted in situ keratomileusis; HTN hypertension; DM diabetes mellitus; COPD chronic obstructive pulmonary disease

## 2018-11-01 ENCOUNTER — Ambulatory Visit (INDEPENDENT_AMBULATORY_CARE_PROVIDER_SITE_OTHER): Payer: Medicare Other | Admitting: Ophthalmology

## 2018-11-01 ENCOUNTER — Encounter (INDEPENDENT_AMBULATORY_CARE_PROVIDER_SITE_OTHER): Payer: Self-pay | Admitting: Ophthalmology

## 2018-11-01 DIAGNOSIS — H353213 Exudative age-related macular degeneration, right eye, with inactive scar: Secondary | ICD-10-CM | POA: Diagnosis not present

## 2018-11-01 DIAGNOSIS — H353221 Exudative age-related macular degeneration, left eye, with active choroidal neovascularization: Secondary | ICD-10-CM | POA: Diagnosis not present

## 2018-11-01 DIAGNOSIS — H35033 Hypertensive retinopathy, bilateral: Secondary | ICD-10-CM

## 2018-11-01 DIAGNOSIS — H3581 Retinal edema: Secondary | ICD-10-CM | POA: Diagnosis not present

## 2018-11-01 DIAGNOSIS — I10 Essential (primary) hypertension: Secondary | ICD-10-CM | POA: Diagnosis not present

## 2018-11-01 DIAGNOSIS — Z961 Presence of intraocular lens: Secondary | ICD-10-CM

## 2018-11-01 MED ORDER — BEVACIZUMAB CHEMO INJECTION 1.25MG/0.05ML SYRINGE FOR KALEIDOSCOPE
1.2500 mg | INTRAVITREAL | Status: DC
  Administered 2018-11-01: 1.25 mg via INTRAVITREAL

## 2018-11-04 ENCOUNTER — Ambulatory Visit (HOSPITAL_COMMUNITY)
Admission: RE | Admit: 2018-11-04 | Discharge: 2018-11-04 | Disposition: A | Discharge status: Home / Self Care | Payer: Medicare Other | Source: Ambulatory Visit | Attending: Cardiology | Admitting: Cardiology

## 2018-11-04 DIAGNOSIS — R0989 Other specified symptoms and signs involving the circulatory and respiratory systems: Secondary | ICD-10-CM | POA: Diagnosis present

## 2018-11-04 DIAGNOSIS — M79671 Pain in right foot: Secondary | ICD-10-CM | POA: Insufficient documentation

## 2018-11-04 DIAGNOSIS — M79672 Pain in left foot: Secondary | ICD-10-CM | POA: Diagnosis present

## 2018-11-08 DIAGNOSIS — D696 Thrombocytopenia, unspecified: Secondary | ICD-10-CM | POA: Insufficient documentation

## 2018-11-09 ENCOUNTER — Telehealth: Payer: Self-pay | Admitting: *Deleted

## 2018-11-09 NOTE — Telephone Encounter (Signed)
-----   Message from Trula Slade, DPM sent at 11/07/2018  8:02 AM EST ----- Val- please let him know that the circulation is decreased. Looks like they scheduled him already to see Dr. Gwenlyn Found and he should follow up as scheduled. Thanks.

## 2018-11-09 NOTE — Telephone Encounter (Signed)
Unable to leave a message on pt's home phone voicemail is full.

## 2018-11-28 ENCOUNTER — Ambulatory Visit (INDEPENDENT_AMBULATORY_CARE_PROVIDER_SITE_OTHER): Payer: Medicare Other | Admitting: *Deleted

## 2018-11-28 ENCOUNTER — Other Ambulatory Visit: Payer: Self-pay

## 2018-11-28 DIAGNOSIS — I4821 Permanent atrial fibrillation: Secondary | ICD-10-CM

## 2018-11-28 DIAGNOSIS — R001 Bradycardia, unspecified: Secondary | ICD-10-CM | POA: Diagnosis not present

## 2018-11-29 ENCOUNTER — Ambulatory Visit: Payer: Medicare Other | Admitting: Cardiovascular Disease

## 2018-11-29 LAB — CUP PACEART REMOTE DEVICE CHECK
Battery Remaining Longevity: 49 mo
Battery Voltage: 2.76 V
Brady Statistic RV Percent Paced: 7 %
Date Time Interrogation Session: 20200322221838
Implantable Lead Implant Date: 20041008
Implantable Lead Implant Date: 20041008
Implantable Lead Location: 753859
Implantable Lead Location: 753860
Implantable Lead Model: 5076
Implantable Lead Model: 5076
Implantable Pulse Generator Implant Date: 20130328
Lead Channel Impedance Value: 67 Ohm
Lead Channel Impedance Value: 800 Ohm
Lead Channel Setting Pacing Amplitude: 2.5 V
Lead Channel Setting Pacing Pulse Width: 1 ms
Lead Channel Setting Sensing Sensitivity: 4 mV
MDC IDC MSMT BATTERY IMPEDANCE: 1519 Ohm

## 2018-12-06 NOTE — Progress Notes (Signed)
Remote pacemaker transmission.   

## 2018-12-07 NOTE — Progress Notes (Signed)
[facility] Note  12/13/2018     CHIEF COMPLAINT Patient presents for Retina Follow Up   HISTORY OF PRESENT ILLNESS: Steven Williams is a 85 y.o. male who presents to the clinic today for:   HPI    Retina Follow Up    Patient presents with  Wet AMD.  In both eyes.  Severity is severe.  Duration of 6 weeks.  Since onset it is gradually worsening.  I, the attending physician,  performed the HPI with the patient and updated documentation appropriately.          Comments    6 weeks retina follow up for Wet Armd ou. Patient feels like vision is getting worse.       Last edited by [name], MD on 12/13/2018  2:41 PM. (History)    pt states he does not feel his vision is doing as well today  Referring physician: [name], MD [place], [place] [address]  HISTORICAL INFORMATION:   Selected notes from the [facility] Referred by [name], [place] LEE:  Ocular Hx- PMH-    CURRENT MEDICATIONS: Current Outpatient Medications (Ophthalmic Drugs)  Medication Sig  . Ranibizumab (LUCENTIS IZ) by Intravitreal route every 30 (thirty) days. Patient is unsure of dose   No current facility-administered medications for this visit.  (Ophthalmic Drugs)   Current Outpatient Medications (Other)  Medication Sig  . allopurinol (ZYLOPRIM) 300 MG tablet Take 300 mg by mouth daily.  . carvedilol (COREG) 25 MG tablet Take 25 mg by mouth 2 (two) times daily with a meal.  . Certolizumab Pegol (CIMZIA [place]) Inject into the skin every 30 (thirty) days. Patient is unsure of dose  . Cholecalciferol (VITAMIN D3) 50 MCG (2000 UT) TABS Take 1 tablet by mouth 2 (two) times daily.  Marland Kitchen dicyclomine (BENTYL) 10 MG capsule Take 10 mg by mouth 4 (four) times daily.  . digoxin (LANOXIN) 0.125 MG tablet Take 0.125 mg by mouth as directed. Patient takes medication on M/W/F  . Ferrous Sulfate (IRON) 325 (65 Fe) MG TABS Take 1 tablet by mouth 2 (two)  times daily.  . furosemide (LASIX) 40 MG tablet Take 40 mg by mouth daily.  Marland Kitchen gabapentin (NEURONTIN) 400 MG capsule Take 400 mg by mouth at bedtime.  Marland Kitchen levothyroxine (SYNTHROID, LEVOTHROID) 88 MCG tablet Take 88 mcg by mouth daily before breakfast.  . Misc Natural Products (GLUCOSAMINE CHOND COMPLEX/MSM PO) Take 1 tablet by mouth daily.  . Multiple Vitamins-Minerals (PRESERVISION AREDS PO) Take 1 tablet by mouth 2 (two) times daily.  . rosuvastatin (CRESTOR) 10 MG tablet Take 10 mg by mouth daily.  . saw palmetto 500 MG capsule Take 500 mg by mouth 2 (two) times daily.  . tamsulosin (FLOMAX) 0.4 MG CAPS capsule Take 0.4 mg by mouth daily.  . temazepam (RESTORIL) 15 MG capsule Take 15 mg by mouth at bedtime as needed for sleep.  . Turmeric 500 MG CAPS Take 1 capsule by mouth daily.  Marland Kitchen warfarin (COUMADIN) 2 MG tablet Take 2 mg by mouth daily.  . carvedilol (COREG) 6.25 MG tablet Take 6.25 mg by mouth 2 (two) times daily.   Current Facility-Administered Medications (Other)  Medication Route  . Bevacizumab (AVASTIN) SOLN 1.25 mg Intravitreal      REVIEW OF SYSTEMS: ROS    Positive for: Musculoskeletal, Endocrine, Cardiovascular, Eyes   Negative for: Constitutional, Gastrointestinal, Neurological, Skin, Genitourinary, HENT, Respiratory, Psychiatric, Allergic/Imm, Heme/Lymph   Last edited  by Elmore Guise on 12/13/2018  1:45 PM. (History)       ALLERGIES Allergies  Allergen Reactions  . Iodinated Diagnostic Agents Rash    PAST MEDICAL HISTORY Past Medical History:  Diagnosis Date  . Antiphospholipid antibody positive   . Aortic aneurysm ([place])   . Aortic dissection ([place])   . Central retinal artery occlusion   . Chronic anemia   . Chronic renal insufficiency   . COPD (chronic obstructive pulmonary disease) ([place])   . DDD (degenerative disc disease), cervical   . DJD (degenerative joint disease)   . DVT (deep venous thrombosis) ([place])   . Gastric dysmotility   . GERD  (gastroesophageal reflux disease)   . Gout   . HTN (hypertension)   . Hyperlipidemia   . Myelodysplasia (myelodysplastic syndrome) ([place])   . Paget's disease of bone   . Permanent atrial fibrillation   . Rheumatoid arthritis ([place])   . Symptomatic bradycardia   . TIA (transient ischemic attack)    Past Surgical History:  Procedure Laterality Date  . ATRIAL FIBRILLATION ABLATION  09/23/2014   Dr Encarnacion Chu in [place]  . BACK SURGERY    . CAROTID ENDARTERECTOMY    . CAROTID STENT    . CATARACT EXTRACTION Bilateral 2010   [place]  . EYE SURGERY    . LUNG DECORTICATION     [facility]  . PACEMAKER GENERATOR CHANGE  12/03/2011   [facility] Adapta ADDR01 generator change by Dr Encarnacion Chu for symptomatic bradycardia  . PACEMAKER IMPLANT  2004   MD[facility]     FAMILY HISTORY History reviewed. No pertinent family history.  SOCIAL HISTORY Social History   Tobacco Use  . Smoking status: Former Smoker    Types: Cigarettes    Last attempt to quit: 1961    Years since quitting: 59.3  . Smokeless tobacco: Never Used  Substance Use Topics  . Alcohol use: Yes    Alcohol/week: 2.0 standard drinks    Types: 2 Glasses of wine per week    Comment: daily  . Drug use: Never         OPHTHALMIC EXAM:  Base Eye Exam    Visual Acuity (Snellen - Linear)      Right Left   Dist [place] 20/CF@3  20/70-3   Dist ph [place] 20/NI 20/NI       Tonometry (Tonopen, 1:47 PM)      Right Left   Pressure 12 11       Pupils      Dark Light Shape React APD   Right 3 2 Round Brisk None   Left 3 2 Round Brisk None       Visual Fields (Counting fingers)      Left Right    Full Full       Extraocular Movement      Right Left    Full, Ortho Full, Ortho       Neuro/Psych    Oriented x3:  Yes   Mood/Affect:  Normal       Dilation    Both eyes:  1.0% Mydriacyl, 2.5% Phenylephrine @ 1:47 PM        Slit Lamp and Fundus Exam    Slit Lamp Exam      Right Left   Lids/Lashes Dermatochalasis - upper lid, Meibomian gland  dysfunction Dermatochalasis - upper lid, Meibomian gland dysfunction   Conjunctiva/Sclera White and quiet White and quiet   Cornea Arcus, 2-3+ Punctate epithelial erosions, Well healed cataract wounds Arcus, 3-4+ Punctate  epithelial erosions centrally, Well healed cataract wounds   Anterior Chamber Deep and quiet Deep and quiet   Iris Round and dilated Round and poorly dilated to 4.5   Lens Posterior chamber intraocular lens, trace Posterior capsular opacification Posterior chamber intraocular lens, trace Posterior capsular opacification   Vitreous Vitreous syneresis, Posterior vitreous detachment Vitreous syneresis       Fundus Exam      Right Left   Disc +cupping, mild pallor, 360 PPA 360 PPA, +cupping   C/D Ratio 0.7 0.7   Macula Flat, diffuse RPE atrophy, central pigment clumping, No heme or edema Flat, Blunted foveal reflex, Drusen, RPE mottling, clumping and atrophy, No heme or edema   Vessels Vascular attenuation Vascular attenuation, Tortuous   Periphery Attached, mild reticular degeneration, mild paving stone inferiorly Attached, mild reticular degeneration          IMAGING AND PROCEDURES  Imaging and Procedures for @TODAY @  OCT, Retina - OU - Both Eyes       Right Eye Quality was good. Central Foveal Thickness: 286. Progression has been stable. Findings include abnormal foveal contour, no IRF, no SRF, subretinal hyper-reflective material, retinal drusen , outer retinal atrophy, pigment epithelial detachment.   Left Eye Quality was good. Central Foveal Thickness: 280. Progression has been stable. Findings include abnormal foveal contour, no IRF, no SRF, outer retinal atrophy, retinal drusen , pigment epithelial detachment, subretinal hyper-reflective material (Interval improvement in cystic changes).   Notes *Images captured and stored on drive  Diagnosis / Impression:  Exudative ARMD OU -- inactive OU ?interval increase in central atrophy / SRHM / scar  Clinical  management:  See below  Abbreviations: NFP - Normal foveal profile. CME - cystoid macular edema. PED - pigment epithelial detachment. IRF - intraretinal fluid. SRF - subretinal fluid. EZ - ellipsoid zone. ERM - epiretinal membrane. ORA - outer retinal atrophy. ORT - outer retinal tubulation. SRHM - subretinal hyper-reflective material        Intravitreal Injection, Pharmacologic Agent - OS - Left Eye       Time Out 12/13/2018. 3:05 PM. Confirmed correct patient, procedure, site, and patient consented.   Anesthesia Topical anesthesia was used. Anesthetic medications included Lidocaine 2%, Proparacaine 0.5%.   Procedure Preparation included 5% betadine to ocular surface, eyelid speculum. A supplied needle was used.   Injection:  1.25 mg Bevacizumab (AVASTIN) SOLN   NDC: 63335-456-25, Lot: [id number] , Expiration date: 09/06/2019   Route: Intravitreal, Site: Left Eye, Waste: 0 mL  Post-op Post injection exam found visual acuity of at least counting fingers. The patient tolerated the procedure well. There were no complications. The patient received written and verbal post procedure care education.                 ASSESSMENT/PLAN:    ICD-10-CM   1. Exudative age-related macular degeneration of left eye with active choroidal neovascularization (HCC) H35.3221 Intravitreal Injection, Pharmacologic Agent - OS - Left Eye    Bevacizumab (AVASTIN) SOLN 1.25 mg  2. Exudative age-related macular degeneration of right eye with inactive scar ([place]) H35.3213   3. Retinal edema H35.81 OCT, Retina - OU - Both Eyes  4. Essential hypertension I10   5. Hypertensive retinopathy of both eyes H35.033   6. Pseudophakia of both eyes Z96.1     1-3. 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    OD- w/ inactive scar -- history of low vision, CF 3'  OS- w/ CNV   - history of multiple IVL OS  in [place], [place] w/ Dr. Doristine Church -- last injection 06/2018, q4-6 wk interval  - moved to [place]  in November 2019 and has been followed by Dr. Baird Cancer who had recommended observation, thus pt sought second opinion  - given functional monocular status and history of active CNV, s/p IVA #1 OS (02.25.20)  - OCT shows PEDs, CNV w/ tr SRF OS -- improved; OD with inactive scar  - BCVA OS down to 20/70 from 20/50 -- ?increased central atrophy/scarring  - recommend IVA #2 OS today with dec interval to 4 wks  - pt wishes to be treated with IVA OS  - RBA of procedure discussed, questions answered  - informed consent obtained and signed  - see procedure note  - f/u in 4 wks -- DFE, OCT  4,5. Hypertensive retinopathy OU - discussed importance of tight BP control - monitor  6. Pseudophakia OU  - s/p CE/IOL OU  - monitor   Ophthalmic Meds Ordered this visit:  Meds ordered this encounter  Medications  . Bevacizumab (AVASTIN) SOLN 1.25 mg       Return in about 4 weeks (around 01/10/2019) for f/u exu ARMD OU, DFE, OCT.  There are no Patient Instructions on file for this visit.   Explained the diagnoses, plan, and follow up with the patient and they expressed understanding.  Patient expressed understanding of the importance of proper follow up care.   This document serves as a record of services personally performed by Gardiner Sleeper, MD, PhD. It was created on their behalf by Ernest Mallick, OA, an ophthalmic assistant. The creation of this record is the provider's dictation and/or activities during the visit.    Electronically signed by: Ernest Mallick, OA  04.01.2020 4:08 PM    Gardiner Sleeper, M.D., Ph.D. Diseases & Surgery of the Retina and Vitreous [facility]  I have reviewed the above documentation for accuracy and completeness, and I agree with the above. Gardiner Sleeper, M.D., Ph.D. 12/13/18 4:10 PM    Abbreviations: M myopia (nearsighted); A astigmatism; H hyperopia (farsighted); P presbyopia; Mrx spectacle prescription;  CTL contact lenses; OD right  eye; OS left eye; OU both eyes  XT exotropia; ET esotropia; PEK punctate epithelial keratitis; PEE punctate epithelial erosions; DES dry eye syndrome; MGD meibomian gland dysfunction; ATs artificial tears; PFAT's preservative free artificial tears; [place] nuclear sclerotic cataract; PSC posterior subcapsular cataract; ERM epi-retinal membrane; PVD posterior vitreous detachment; RD retinal detachment; DM diabetes mellitus; DR diabetic retinopathy; NPDR non-proliferative diabetic retinopathy; PDR proliferative diabetic retinopathy; CSME clinically significant macular edema; DME diabetic macular edema; dbh dot blot hemorrhages; CWS cotton wool spot; POAG primary open angle glaucoma; C/D cup-to-disc ratio; HVF humphrey visual field; GVF goldmann visual field; OCT optical coherence tomography; IOP intraocular pressure; BRVO Branch retinal vein occlusion; CRVO central retinal vein occlusion; CRAO central retinal artery occlusion; BRAO branch retinal artery occlusion; RT retinal tear; SB scleral buckle; PPV pars plana vitrectomy; VH Vitreous hemorrhage; PRP panretinal laser photocoagulation; IVK intravitreal kenalog; VMT vitreomacular traction; MH Macular hole;  NVD neovascularization of the disc; NVE neovascularization elsewhere; AREDS age related eye disease study; ARMD age related macular degeneration; POAG primary open angle glaucoma; EBMD epithelial/anterior basement membrane dystrophy; ACIOL anterior chamber intraocular lens; IOL intraocular lens; PCIOL posterior chamber intraocular lens; Phaco/IOL phacoemulsification with intraocular lens placement; [place] photorefractive keratectomy; LASIK laser assisted in situ keratomileusis; HTN hypertension; DM diabetes mellitus; COPD chronic obstructive pulmonary disease

## 2018-12-13 ENCOUNTER — Other Ambulatory Visit: Payer: Self-pay

## 2018-12-13 ENCOUNTER — Encounter (INDEPENDENT_AMBULATORY_CARE_PROVIDER_SITE_OTHER): Payer: Self-pay | Admitting: Ophthalmology

## 2018-12-13 ENCOUNTER — Ambulatory Visit (INDEPENDENT_AMBULATORY_CARE_PROVIDER_SITE_OTHER): Payer: Medicare Other | Admitting: Ophthalmology

## 2018-12-13 DIAGNOSIS — Z961 Presence of intraocular lens: Secondary | ICD-10-CM

## 2018-12-13 DIAGNOSIS — H353221 Exudative age-related macular degeneration, left eye, with active choroidal neovascularization: Secondary | ICD-10-CM

## 2018-12-13 DIAGNOSIS — I10 Essential (primary) hypertension: Secondary | ICD-10-CM

## 2018-12-13 DIAGNOSIS — H3581 Retinal edema: Secondary | ICD-10-CM

## 2018-12-13 DIAGNOSIS — H35033 Hypertensive retinopathy, bilateral: Secondary | ICD-10-CM

## 2018-12-13 DIAGNOSIS — H353213 Exudative age-related macular degeneration, right eye, with inactive scar: Secondary | ICD-10-CM | POA: Diagnosis not present

## 2018-12-13 MED ORDER — BEVACIZUMAB CHEMO INJECTION 1.25MG/0.05ML SYRINGE FOR KALEIDOSCOPE
1.2500 mg | INTRAVITREAL | Status: AC | PRN
  Administered 2018-12-13: 1.25 mg via INTRAVITREAL

## 2018-12-20 ENCOUNTER — Telehealth: Payer: Self-pay | Admitting: *Deleted

## 2018-12-20 ENCOUNTER — Other Ambulatory Visit: Payer: Self-pay

## 2018-12-20 ENCOUNTER — Encounter: Payer: Self-pay | Admitting: Podiatry

## 2018-12-20 ENCOUNTER — Ambulatory Visit (INDEPENDENT_AMBULATORY_CARE_PROVIDER_SITE_OTHER): Payer: Medicare Other | Admitting: Podiatry

## 2018-12-20 VITALS — Temp 94.6°F

## 2018-12-20 DIAGNOSIS — B351 Tinea unguium: Secondary | ICD-10-CM | POA: Diagnosis not present

## 2018-12-20 DIAGNOSIS — M79675 Pain in left toe(s): Secondary | ICD-10-CM | POA: Diagnosis not present

## 2018-12-20 DIAGNOSIS — R0989 Other specified symptoms and signs involving the circulatory and respiratory systems: Secondary | ICD-10-CM

## 2018-12-20 DIAGNOSIS — L84 Corns and callosities: Secondary | ICD-10-CM

## 2018-12-20 DIAGNOSIS — M79674 Pain in right toe(s): Secondary | ICD-10-CM

## 2018-12-20 DIAGNOSIS — I739 Peripheral vascular disease, unspecified: Secondary | ICD-10-CM

## 2018-12-20 DIAGNOSIS — Z7901 Long term (current) use of anticoagulants: Secondary | ICD-10-CM

## 2018-12-20 NOTE — Telephone Encounter (Signed)
-----   Message from Roney Jaffe, RN sent at 12/20/2018 11:07 AM EDT ----- Please send a referral to Dr Gwenlyn Found for consult per Dr Jacqualyn Posey. He recently had arterial studies that revealed a decrease in circulation. Thank you

## 2018-12-20 NOTE — Telephone Encounter (Signed)
Referral faxed to [facility]-Dr. Gwenlyn Found.

## 2018-12-20 NOTE — Progress Notes (Signed)
Subjective: Steven Williams is an 85 y.o male who presents to clinic today for follow up. He has been diagnosed with PAD. He is seen for elongated,  thick toenails b/l which interfere with daily activities. Pain is aggravated when wearing enclosed shoe gearand relieved with periodic professional debridement.  He states Dr. Jacqualyn Posey ordered a  "two hour test" and he would like to know the results of it.  He lives at [employer].  Steven Redwood, MD is his PCP.    Current Outpatient Medications:  .  allopurinol (ZYLOPRIM) 300 MG tablet, Take 300 mg by mouth daily., Disp: , Rfl:  .  carvedilol (COREG) 25 MG tablet, Take 25 mg by mouth 2 (two) times daily with a meal., Disp: , Rfl:  .  carvedilol (COREG) 6.25 MG tablet, Take 6.25 mg by mouth 2 (two) times daily., Disp: , Rfl:  .  Certolizumab Pegol (CIMZIA [place]), Inject into the skin every 30 (thirty) days. Patient is unsure of dose, Disp: , Rfl:  .  Cholecalciferol (VITAMIN D3) 50 MCG (2000 UT) TABS, Take 1 tablet by mouth 2 (two) times daily., Disp: , Rfl:  .  dicyclomine (BENTYL) 10 MG capsule, Take 10 mg by mouth 4 (four) times daily., Disp: , Rfl:  .  digoxin (LANOXIN) 0.125 MG tablet, Take 0.125 mg by mouth as directed. Patient takes medication on M/W/F, Disp: , Rfl:  .  Ferrous Sulfate (IRON) 325 (65 Fe) MG TABS, Take 1 tablet by mouth 2 (two) times daily., Disp: , Rfl:  .  furosemide (LASIX) 40 MG tablet, Take 40 mg by mouth daily., Disp: , Rfl:  .  gabapentin (NEURONTIN) 400 MG capsule, Take 400 mg by mouth at bedtime., Disp: , Rfl:  .  levothyroxine (SYNTHROID, LEVOTHROID) 88 MCG tablet, Take 88 mcg by mouth daily before breakfast., Disp: , Rfl:  .  Misc Natural Products (GLUCOSAMINE CHOND COMPLEX/MSM PO), Take 1 tablet by mouth daily., Disp: , Rfl:  .  Multiple Vitamins-Minerals (PRESERVISION AREDS PO), Take 1 tablet by mouth 2 (two) times daily., Disp: , Rfl:  .  Ranibizumab (LUCENTIS IZ), by Intravitreal route  every 30 (thirty) days. Patient is unsure of dose, Disp: , Rfl:  .  rosuvastatin (CRESTOR) 10 MG tablet, Take 10 mg by mouth daily., Disp: , Rfl:  .  saw palmetto 500 MG capsule, Take 500 mg by mouth 2 (two) times daily., Disp: , Rfl:  .  tamsulosin (FLOMAX) 0.4 MG CAPS capsule, Take 0.4 mg by mouth daily., Disp: , Rfl:  .  temazepam (RESTORIL) 15 MG capsule, Take 15 mg by mouth at bedtime as needed for sleep., Disp: , Rfl:  .  Turmeric 500 MG CAPS, Take 1 capsule by mouth daily., Disp: , Rfl:  .  warfarin (COUMADIN) 2 MG tablet, Take 2 mg by mouth daily., Disp: , Rfl:   Current Facility-Administered Medications:  .  Bevacizumab (AVASTIN) SOLN 1.25 mg, 1.25 mg, Intravitreal, , [name], MD, 1.25 mg at 11/01/18 2343   Allergies  Allergen Reactions  . Iodinated Diagnostic Agents Rash     Objective:  Vascular Examination: Capillary refill time <4 seconds  x 10 digits.  Dorsalis pedis pulses 0/4 b/l.  Posterior tibial pulses 0/4 b/l.  Digital hair absent x 10 digits.  Skin temperature gradient warm to cool  b/l  Dermatological Examination: Skin thin, shiny and atrophic b/l  Atrophy of plantar fat pad b/l with mild hyperkertosis submet head 1 b/l. No erythema, no edema, no drainage,  no flocculence noted b/l.  Toenails 1-5 b/l discolored, thick, dystrophic with subungual debris and pain with palpation to nailbeds due to thickness of nails.  Musculoskeletal: Muscle strength 5/5 to all LE muscle groups  Neurological: Sensation intact with 10 gram monofilament.  ABI results on 11/04/2018: ABI Findings: +---------+------------------+-----+----------+--------+ Right    Rt Pressure (mmHg)IndexWaveform  Comment  +---------+------------------+-----+----------+--------+ Brachial 153                                       +---------+------------------+-----+----------+--------+ CFA                             triphasic           +---------+------------------+-----+----------+--------+ Popliteal                       biphasic           +---------+------------------+-----+----------+--------+ ATA      254               1.66 monophasic         +---------+------------------+-----+----------+--------+ PTA      95                0.62 monophasic         +---------+------------------+-----+----------+--------+ PERO     254               1.66 monophasic         +---------+------------------+-----+----------+--------+ Great Toe85                0.56                    +---------+------------------+-----+----------+--------+  +---------+------------------+-----+----------+-------+ Left     Lt Pressure (mmHg)IndexWaveform  Comment +---------+------------------+-----+----------+-------+ Brachial 144                                      +---------+------------------+-----+----------+-------+ CFA                             triphasic         +---------+------------------+-----+----------+-------+ Popliteal                       biphasic          +---------+------------------+-----+----------+-------+ ATA      254               1.66 monophasic        +---------+------------------+-----+----------+-------+ PTA      254               1.66 monophasic        +---------+------------------+-----+----------+-------+ PERO     254               1.66 monophasic        +---------+------------------+-----+----------+-------+ Great Toe102               0.67                   +---------+------------------+-----+----------+-------+  +-------+-----------+-----------+------------+------------+ ABI/TBIToday's ABIToday's TBIPrevious ABIPrevious TBI +-------+-----------+-----------+------------+------------+ Right           0.56                                +-------+-----------+-----------+------------+------------+  Left   [place]         0.67                                 +-------+-----------+-----------+------------+------------+  TOES Findings: +----------+---------------+--------+-------+ Right ToesPressure (mmHg)WaveformComment +----------+---------------+--------+-------+ 1st Digit                Abnormal        +----------+---------------+--------+-------+ 2nd Digit                Abnormal        +----------+---------------+--------+-------+ 3rd Digit                Abnormal        +----------+---------------+--------+-------+ 4th Digit                Abnormal        +----------+---------------+--------+-------+ 5th Digit                Abnormal        +----------+---------------+--------+-------+   +---------+---------------+--------+-------+ Left ToesPressure (mmHg)WaveformComment +---------+---------------+--------+-------+ 1st Digit               Normal          +---------+---------------+--------+-------+ 2nd Digit               Abnormal        +---------+---------------+--------+-------+ 3rd Digit               Abnormal        +---------+---------------+--------+-------+ 4th Digit               Abnormal        +---------+---------------+--------+-------+ 5th Digit               Abnormal        +---------+---------------+--------+-------+    Arterial wall calcification precludes accurate ankle pressures and ABIs. Tibial waveform analysis is consistent with some element of peripheral arterial disease.   Summary: Right: Resting right ankle-brachial index indicates noncompressible right lower extremity arteries.The right toe-brachial index is abnormal.  Left: Resting left ankle-brachial index indicates noncompressible left lower extremity arteries.The left toe-brachial index is abnormal.    *See table(s) above for measurements and observations. See arterial duplex report.  Patient scheduled with Dr. Gwenlyn Found on 11/29/18. Electronically  signed by Ena Dawley MD on 11/07/2018 at 9:07:17 AM.  Assessment: 1. Painful onychomycosis toenails 1-5 b/l 2. Calluses submet head 1 b/l 3. NIDDM with Peripheral arterial disease  Plan: 1. Toenails 1-5 b/l were debrided in length and girth without iatrogenic bleeding. Calluses debrided submetatarsal head(s) 1 b/l utilizing dremel without incident. 2. Patient informed that his results showed decreased circulation to both feet and instructed him to see Dr. Gwen[facility]. Patient relates he did receive call from Vascular Clinic, but he has not called them back. We instructed him to call and schedule appointment for Vascular Consultation with Dr. Gwenlyn Found. He has no tissue loss nor ischemia, but should follow up soon. 3. Patient to continue soft, supportive shoe gear daily. 4. Patient to report any pedal injuries to medical professional immediately. 5. Follow up 9 weeks. 6. Patient/POA to call should there be a concern in the interim.

## 2018-12-21 ENCOUNTER — Telehealth: Payer: Self-pay | Admitting: *Deleted

## 2018-12-21 NOTE — Telephone Encounter (Signed)
-----   Message from Marzetta Board, DPM sent at 12/20/2018  6:05 PM EDT ----- Regarding: [facility] V[facility] with Dr. Barry Brunner Val!  This may be redundant if J[facility] already reached out to you about Mr. Hoehn. He had ABIs done on 2/28 and was supposed to follow up with Dr. Gwenlyn Found. The vascular clinic called him but he admits he never returned the call. If you can make sure he gets a new appt, that would be great.   Not urgent.  Seemed like he had a lot on his mind and I don't want this to slip through the cracks.   Thanks!

## 2018-12-21 NOTE — Telephone Encounter (Signed)
Unable to leave a message pt's mailbox is full.

## 2019-01-09 NOTE — Progress Notes (Signed)
[facility] Note  01/10/2019     CHIEF COMPLAINT Patient presents for Retina Follow Up   HISTORY OF PRESENT ILLNESS: Steven Williams is a 85 y.o. male who presents to the clinic today for:   HPI    Retina Follow Up    Patient presents with  Wet AMD.  In right eye.  This started 4 weeks ago.  Severity is moderate.  Duration of 4 weeks.  I, the attending physician,  performed the HPI with the patient and updated documentation appropriately.          Comments    Patient here for 4 weeks retina follow up for exu ARMD OD. Patient states vision terrible, really bad, gets worse. Has to read word for word instead of a group of words. No eye pain       Last edited by [name], MD on 01/10/2019  2:24 PM. (History)    pt states   Referring physician: [name], MD [address] [place], [place] [address]  HISTORICAL INFORMATION:   Selected notes from the [place] Referred by [name], [place] LEE:  Ocular Hx- PMH-    CURRENT MEDICATIONS: Current Outpatient Medications (Ophthalmic Drugs)  Medication Sig  . Ranibizumab (LUCENTIS IZ) by Intravitreal route every 30 (thirty) days. Patient is unsure of dose   No current facility-administered medications for this visit.  (Ophthalmic Drugs)   Current Outpatient Medications (Other)  Medication Sig  . allopurinol (ZYLOPRIM) 300 MG tablet Take 300 mg by mouth daily.  . carvedilol (COREG) 25 MG tablet Take 25 mg by mouth 2 (two) times daily with a meal.  . carvedilol (COREG) 6.25 MG tablet Take 6.25 mg by mouth 2 (two) times daily.  . Certolizumab Pegol (CIMZIA [place]) Inject into the skin every 30 (thirty) days. Patient is unsure of dose  . Cholecalciferol (VITAMIN D3) 50 MCG (2000 UT) TABS Take 1 tablet by mouth 2 (two) times daily.  Marland Kitchen dicyclomine (BENTYL) 10 MG capsule Take 10 mg by mouth 4 (four) times daily.  . digoxin (LANOXIN) 0.125 MG tablet Take 0.125 mg by mouth as directed.  Patient takes medication on M/W/F  . Ferrous Sulfate (IRON) 325 (65 Fe) MG TABS Take 1 tablet by mouth 2 (two) times daily.  . furosemide (LASIX) 40 MG tablet Take 40 mg by mouth daily.  Marland Kitchen gabapentin (NEURONTIN) 400 MG capsule Take 400 mg by mouth at bedtime.  Marland Kitchen levothyroxine (SYNTHROID, LEVOTHROID) 88 MCG tablet Take 88 mcg by mouth daily before breakfast.  . Misc Natural Products (GLUCOSAMINE CHOND COMPLEX/MSM PO) Take 1 tablet by mouth daily.  . Multiple Vitamins-Minerals (PRESERVISION AREDS PO) Take 1 tablet by mouth 2 (two) times daily.  . rosuvastatin (CRESTOR) 10 MG tablet Take 10 mg by mouth daily.  . saw palmetto 500 MG capsule Take 500 mg by mouth 2 (two) times daily.  . tamsulosin (FLOMAX) 0.4 MG CAPS capsule Take 0.4 mg by mouth daily.  . temazepam (RESTORIL) 15 MG capsule Take 15 mg by mouth at bedtime as needed for sleep.  . Turmeric 500 MG CAPS Take 1 capsule by mouth daily.  Marland Kitchen warfarin (COUMADIN) 2 MG tablet Take 2 mg by mouth daily.   Current Facility-Administered Medications (Other)  Medication Route  . Bevacizumab (AVASTIN) SOLN 1.25 mg Intravitreal      REVIEW OF SYSTEMS: ROS    Positive for: Musculoskeletal, Endocrine, Cardiovascular, Eyes   Negative for: Constitutional, Gastrointestinal, Neurological, Skin, Genitourinary, HENT,  Respiratory, Psychiatric, Allergic/Imm, Heme/Lymph   Last edited by [name] on 01/10/2019  1:29 PM. (History)       ALLERGIES Allergies  Allergen Reactions  . Iodinated Diagnostic Agents Rash    PAST MEDICAL HISTORY Past Medical History:  Diagnosis Date  . Antiphospholipid antibody positive   . Aortic aneurysm ([place])   . Aortic dissection ([place])   . Central retinal artery occlusion   . Chronic anemia   . Chronic renal insufficiency   . COPD (chronic obstructive pulmonary disease) ([place])   . DDD (degenerative disc disease), cervical   . DJD (degenerative joint disease)   . DVT (deep venous thrombosis) ([place])   . Gastric  dysmotility   . GERD (gastroesophageal reflux disease)   . Gout   . HTN (hypertension)   . Hyperlipidemia   . Myelodysplasia (myelodysplastic syndrome) ([place])   . Paget's disease of bone   . Permanent atrial fibrillation   . Rheumatoid arthritis ([place])   . Symptomatic bradycardia   . TIA (transient ischemic attack)    Past Surgical History:  Procedure Laterality Date  . ATRIAL FIBRILLATION ABLATION  09/23/2014   Dr Encarnacion Chu in [place]  . BACK SURGERY    . CAROTID ENDARTERECTOMY    . CAROTID STENT    . CATARACT EXTRACTION Bilateral 2010   [place]  . EYE SURGERY    . LUNG DECORTICATION     [facility]  . PACEMAKER GENERATOR CHANGE  12/03/2011   MDT Adapta ADDR01 generator change by Dr Encarnacion Chu for symptomatic bradycardia  . PACEMAKER IMPLANT  2004   MDT     FAMILY HISTORY History reviewed. No pertinent family history.  SOCIAL HISTORY Social History   Tobacco Use  . Smoking status: Former Smoker    Types: Cigarettes    Last attempt to quit: 1961    Years since quitting: 59.3  . Smokeless tobacco: Never Used  Substance Use Topics  . Alcohol use: Yes    Alcohol/week: 2.0 standard drinks    Types: 2 Glasses of wine per week    Comment: daily  . Drug use: Never         OPHTHALMIC EXAM:  Base Eye Exam    Visual Acuity (Snellen - Linear)      Right Left   Dist North CF at 3' 20/60 +2   Dist ph [place] NI 20/50 -2       Tonometry (Tonopen, 1:25 PM)      Right Left   Pressure 16 16       Pupils      Dark Light Shape React APD   Right 3 2 Round Brisk None   Left 3 2 Round Brisk None       Visual Fields      Left Right    Full    Restrictions  Partial inner superior temporal, inferior temporal, superior nasal, inferior nasal deficiencies       Extraocular Movement      Right Left    Full, Ortho Full, Ortho       Neuro/Psych    Oriented x3:  Yes   Mood/Affect:  Normal       Dilation    Both eyes:  1.0% Mydriacyl, 2.5% Phenylephrine @ 1:25 PM       Dilation #2     Both eyes:  10 % phenylepherine @ 1:45 PM        Slit Lamp and Fundus Exam    Slit Lamp Exam  Right Left   Lids/Lashes Dermatochalasis - upper lid, Meibomian gland dysfunction Dermatochalasis - upper lid, Meibomian gland dysfunction   Conjunctiva/Sclera White and quiet White and quiet   Cornea Arcus, 2-3+ Punctate epithelial erosions, Well healed cataract wounds Arcus, 3-4+ Punctate epithelial erosions centrally, Well healed cataract wounds   Anterior Chamber Deep and quiet Deep and quiet   Iris Round and dilated Round and poorly dilated to 4.5   Lens Posterior chamber intraocular lens, trace Posterior capsular opacification Posterior chamber intraocular lens, trace Posterior capsular opacification   Vitreous Vitreous syneresis, Posterior vitreous detachment Vitreous syneresis       Fundus Exam      Right Left   Disc +cupping, mild pallor, 360 PPA 360 PPA, +cupping   C/D Ratio 0.7 0.7   Macula Flat, diffuse RPE atrophy, central pigment clumping, No heme or edema Flat, Blunted foveal reflex, Drusen, RPE mottling, clumping and atrophy, No heme or edema   Vessels Vascular attenuation Vascular attenuation, Tortuous   Periphery Attached, mild reticular degeneration, mild paving stone inferiorly Attached, mild reticular degeneration          IMAGING AND PROCEDURES  Imaging and Procedures for @TODAY @  OCT, Retina - OU - Both Eyes       Right Eye Quality was good. Central Foveal Thickness: 245. Progression has been stable. Findings include abnormal foveal contour, no IRF, no SRF, subretinal hyper-reflective material, retinal drusen , outer retinal atrophy, pigment epithelial detachment.   Left Eye Quality was good. Central Foveal Thickness: 273. Progression has been stable. Findings include abnormal foveal contour, no IRF, no SRF, outer retinal atrophy, retinal drusen , pigment epithelial detachment, subretinal hyper-reflective material (stable improvement in cystic changes).    Notes *Images captured and stored on drive  Diagnosis / Impression:  Exudative ARMD OU -- inactive OU -- stable from prior  Clinical management:  See below  Abbreviations: NFP - Normal foveal profile. CME - cystoid macular edema. PED - pigment epithelial detachment. IRF - intraretinal fluid. SRF - subretinal fluid. EZ - ellipsoid zone. ERM - epiretinal membrane. ORA - outer retinal atrophy. ORT - outer retinal tubulation. SRHM - subretinal hyper-reflective material        Intravitreal Injection, Pharmacologic Agent - OS - Left Eye       Time Out 01/10/2019. 2:26 PM. Confirmed correct patient, procedure, site, and patient consented.   Anesthesia Topical anesthesia was used. Anesthetic medications included Lidocaine 2%, Proparacaine 0.5%.   Procedure Preparation included 5% betadine to ocular surface, eyelid speculum. A 30 gauge needle was used.   Injection:  1.25 mg Bevacizumab (AVASTIN) SOLN   NDC: 16109-604-54, Lot: [id number] , Expiration date: 02/13/2019   Route: Intravitreal, Site: Left Eye, Waste: 0 mL  Post-op Post injection exam found visual acuity of at least counting fingers. The patient tolerated the procedure well. There were no complications. The patient received written and verbal post procedure care education.                 ASSESSMENT/PLAN:    ICD-10-CM   1. Exudative age-related macular degeneration of left eye with active choroidal neovascularization (HCC) H35.3221 Intravitreal Injection, Pharmacologic Agent - OS - Left Eye    Bevacizumab (AVASTIN) SOLN 1.25 mg  2. Exudative age-related macular degeneration of right eye with inactive scar ([place]) H35.3213   3. Retinal edema H35.81 OCT, Retina - OU - Both Eyes  4. Essential hypertension I10   5. Hypertensive retinopathy of both eyes H35.033   6. Pseudophakia of both eyes  Z96.1     1-3. 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    OD- w/ inactive scar -- history of low vision, CF  3'  OS- w/ CNV   - history of multiple IVL OS in [place], [place] w/ Dr. Doristine Church -- last injection 06/2018, q4-6 wk interval  - moved to [place] in November 2019 and has been followed by Dr. Baird Cancer who had recommended observation, thus pt sought second opinion  - given functional monocular status and history of active CNV has been receiving maintenance injections, s/p IVA #1 OS (02.25.20), #2 (04.07.20)  - OCT shows PEDs, CNV w/ tr SRF OS -- stably improved; OD with inactive scar  - BCVA OS improved to 20/50 (up from 20/70 last visit 04.07.20)  - recommend IVA #3 OS today, 05.05.20, with extension to 6 wks  - pt wishes to be treated with IVA OS  - RBA of procedure discussed, questions answered  - informed consent obtained and signed  - see procedure note  - f/u in 6 wks -- DFE, OCT  4,5. Hypertensive retinopathy OU - discussed importance of tight BP control - monitor  6. Pseudophakia OU  - s/p CE/IOL OU  - monitor   Ophthalmic Meds Ordered this visit:  Meds ordered this encounter  Medications  . Bevacizumab (AVASTIN) SOLN 1.25 mg       Return in about 6 weeks (around 02/21/2019) for DFE, OCT.  There are no Patient Instructions on file for this visit.   Explained the diagnoses, plan, and follow up with the patient and they expressed understanding.  Patient expressed understanding of the importance of proper follow up care.   This document serves as a record of services personally performed by Gardiner Sleeper, MD, PhD. It was created on their behalf by Ernest Mallick, OA, an ophthalmic assistant. The creation of this record is the provider's dictation and/or activities during the visit.    Electronically signed by: Ernest Mallick, OA  04.01.2020 2:26 PM    Gardiner Sleeper, M.D., Ph.D. Diseases & Surgery of the Retina and Vitreous [facility]  I have reviewed the above documentation for accuracy and completeness, and I agree with the above. Gardiner Sleeper, M.D., Ph.D. 01/10/19 2:26 PM     Abbreviations: M myopia (nearsighted); A astigmatism; H hyperopia (farsighted); P presbyopia; Mrx spectacle prescription;  CTL contact lenses; OD right eye; OS left eye; OU both eyes  XT exotropia; ET esotropia; PEK punctate epithelial keratitis; PEE punctate epithelial erosions; DES dry eye syndrome; MGD meibomian gland dysfunction; ATs artificial tears; PFAT's preservative free artificial tears; [place] nuclear sclerotic cataract; PSC posterior subcapsular cataract; ERM epi-retinal membrane; PVD posterior vitreous detachment; RD retinal detachment; DM diabetes mellitus; DR diabetic retinopathy; NPDR non-proliferative diabetic retinopathy; PDR proliferative diabetic retinopathy; CSME clinically significant macular edema; DME diabetic macular edema; dbh dot blot hemorrhages; CWS cotton wool spot; POAG primary open angle glaucoma; C/D cup-to-disc ratio; HVF humphrey visual field; GVF goldmann visual field; OCT optical coherence tomography; IOP intraocular pressure; BRVO Branch retinal vein occlusion; CRVO central retinal vein occlusion; CRAO central retinal artery occlusion; BRAO branch retinal artery occlusion; RT retinal tear; SB scleral buckle; PPV pars plana vitrectomy; VH Vitreous hemorrhage; PRP panretinal laser photocoagulation; IVK intravitreal kenalog; VMT vitreomacular traction; MH Macular hole;  NVD neovascularization of the disc; NVE neovascularization elsewhere; AREDS age related eye disease study; ARMD age related macular degeneration; POAG primary open angle glaucoma; EBMD epithelial/anterior basement membrane dystrophy; ACIOL  anterior chamber intraocular lens; IOL intraocular lens; PCIOL posterior chamber intraocular lens; Phaco/IOL phacoemulsification with intraocular lens placement; [place] photorefractive keratectomy; LASIK laser assisted in situ keratomileusis; HTN hypertension; DM diabetes mellitus; COPD chronic obstructive pulmonary disease

## 2019-01-10 ENCOUNTER — Ambulatory Visit (INDEPENDENT_AMBULATORY_CARE_PROVIDER_SITE_OTHER): Payer: Medicare Other | Admitting: Ophthalmology

## 2019-01-10 ENCOUNTER — Encounter (INDEPENDENT_AMBULATORY_CARE_PROVIDER_SITE_OTHER): Payer: Self-pay | Admitting: Ophthalmology

## 2019-01-10 ENCOUNTER — Other Ambulatory Visit: Payer: Self-pay

## 2019-01-10 DIAGNOSIS — H353221 Exudative age-related macular degeneration, left eye, with active choroidal neovascularization: Secondary | ICD-10-CM | POA: Diagnosis not present

## 2019-01-10 DIAGNOSIS — Z961 Presence of intraocular lens: Secondary | ICD-10-CM

## 2019-01-10 DIAGNOSIS — I10 Essential (primary) hypertension: Secondary | ICD-10-CM

## 2019-01-10 DIAGNOSIS — H353213 Exudative age-related macular degeneration, right eye, with inactive scar: Secondary | ICD-10-CM

## 2019-01-10 DIAGNOSIS — H3581 Retinal edema: Secondary | ICD-10-CM

## 2019-01-10 DIAGNOSIS — H35033 Hypertensive retinopathy, bilateral: Secondary | ICD-10-CM

## 2019-01-10 MED ORDER — BEVACIZUMAB CHEMO INJECTION 1.25MG/0.05ML SYRINGE FOR KALEIDOSCOPE
1.2500 mg | INTRAVITREAL | Status: AC | PRN
  Administered 2019-01-10: 1.25 mg via INTRAVITREAL

## 2019-01-17 ENCOUNTER — Other Ambulatory Visit: Payer: Self-pay | Admitting: *Deleted

## 2019-01-17 DIAGNOSIS — I712 Thoracic aortic aneurysm, without rupture, unspecified: Secondary | ICD-10-CM

## 2019-01-17 NOTE — Progress Notes (Unsigned)
ct 

## 2019-01-18 ENCOUNTER — Telehealth: Payer: Self-pay | Admitting: Cardiovascular Disease

## 2019-01-18 NOTE — Telephone Encounter (Signed)
LMTCB to schedule appt with Dr. Gwenlyn Found, please see referral.

## 2019-02-09 ENCOUNTER — Telehealth: Payer: Self-pay | Admitting: Cardiovascular Disease

## 2019-02-09 NOTE — Telephone Encounter (Signed)
Called patient, but, he said it was a bad time to talk and to call him back this afternoon.

## 2019-02-26 NOTE — Progress Notes (Signed)
[facility] Note  02/27/2019     CHIEF COMPLAINT Patient presents for Retina Follow Up   HISTORY OF PRESENT ILLNESS: Steven Williams is a 85 y.o. male who presents to the clinic today for:   HPI    Retina Follow Up    Patient presents with  Wet AMD.  In both eyes.  This started 7 weeks ago.  Severity is moderate.  I, the attending physician,  performed the HPI with the patient and updated documentation appropriately.          Comments    Patient here for 7 weeks retina follow up for exu ARMD OU. Patient states vision not good. More difficulty in reading. No eye pain in OS. OD acting up thinks swollen.        Last edited by Steven [name], MD on 02/27/2019  5:41 PM. (History)    pt states his close up vision seems to be worse   Referring physician: [name], MD [place],   [id number]  HISTORICAL INFORMATION:   Selected notes from the [facility] Referred by Steven Williams, [place] LEE:  Ocular Hx- PMH-    CURRENT MEDICATIONS: Current Outpatient Medications (Ophthalmic Drugs)  Medication Sig  . Ranibizumab (LUCENTIS IZ) by Intravitreal route every 30 (thirty) days. Patient is unsure of dose   No current facility-administered medications for this visit.  (Ophthalmic Drugs)   Current Outpatient Medications (Other)  Medication Sig  . allopurinol (ZYLOPRIM) 300 MG tablet Take 300 mg by mouth daily.  . carvedilol (COREG) 25 MG tablet Take 25 mg by mouth 2 (two) times daily with a meal.  . carvedilol (COREG) 6.25 MG tablet Take 6.25 mg by mouth 2 (two) times daily.  . Certolizumab Pegol (CIMZIA [place]) Inject into the skin every 30 (thirty) days. Patient is unsure of dose  . Cholecalciferol (VITAMIN D3) 50 MCG (2000 UT) TABS Take 1 tablet by mouth 2 (two) times daily.  Marland Kitchen dicyclomine (BENTYL) 10 MG capsule Take 10 mg by mouth 4 (four) times daily.  . digoxin (LANOXIN) 0.125 MG tablet Take 0.125 mg by mouth as  directed. Patient takes medication on M/W/F  . Ferrous Sulfate (IRON) 325 (65 Fe) MG TABS Take 1 tablet by mouth 2 (two) times daily.  . furosemide (LASIX) 40 MG tablet Take 40 mg by mouth daily.  Marland Kitchen gabapentin (NEURONTIN) 400 MG capsule Take 400 mg by mouth at bedtime.  Marland Kitchen levothyroxine (SYNTHROID, LEVOTHROID) 88 MCG tablet Take 88 mcg by mouth daily before breakfast.  . Misc Natural Products (GLUCOSAMINE CHOND COMPLEX/MSM PO) Take 1 tablet by mouth daily.  . Multiple Vitamins-Minerals (PRESERVISION AREDS PO) Take 1 tablet by mouth 2 (two) times daily.  . rosuvastatin (CRESTOR) 10 MG tablet Take 10 mg by mouth daily.  . saw palmetto 500 MG capsule Take 500 mg by mouth 2 (two) times daily.  . tamsulosin (FLOMAX) 0.4 MG CAPS capsule Take 0.4 mg by mouth daily.  . temazepam (RESTORIL) 15 MG capsule Take 15 mg by mouth at bedtime as needed for sleep.  . Turmeric 500 MG CAPS Take 1 capsule by mouth daily.  Marland Kitchen warfarin (COUMADIN) 2 MG tablet Take 2 mg by mouth daily.   Current Facility-Administered Medications (Other)  Medication Route  . Bevacizumab (AVASTIN) SOLN 1.25 mg Intravitreal      REVIEW OF SYSTEMS: ROS    Positive for: Musculoskeletal, Endocrine, Cardiovascular, Eyes   Negative for: Constitutional, Gastrointestinal, Neurological, Skin, Genitourinary,  HENT, Respiratory, Psychiatric, Allergic/Imm, Heme/Lymph   Last edited by Steven Williams on 02/27/2019  2:28 PM. (History)       ALLERGIES Allergies  Allergen Reactions  . Iodinated Diagnostic Agents Rash    PAST MEDICAL HISTORY Past Medical History:  Diagnosis Date  . Antiphospholipid antibody positive   . Aortic aneurysm ([place])   . Aortic dissection ([place])   . Central retinal artery occlusion   . Chronic anemia   . Chronic renal insufficiency   . COPD (chronic obstructive pulmonary disease) ([place])   . DDD (degenerative disc disease), cervical   . DJD (degenerative joint disease)   . DVT (deep venous thrombosis) ([place])    . Gastric dysmotility   . GERD (gastroesophageal reflux disease)   . Gout   . HTN (hypertension)   . Hyperlipidemia   . Myelodysplasia (myelodysplastic syndrome) ([place])   . Paget's disease of bone   . Permanent atrial fibrillation   . Rheumatoid arthritis ([place])   . Symptomatic bradycardia   . TIA (transient ischemic attack)    Past Surgical History:  Procedure Laterality Date  . ATRIAL FIBRILLATION ABLATION  09/23/2014   Dr Steven Williams in [place]  . BACK SURGERY    . CAROTID ENDARTERECTOMY    . CAROTID STENT    . CATARACT EXTRACTION Bilateral 2010   [place]  . EYE SURGERY    . LUNG DECORTICATION     [facility]  . PACEMAKER GENERATOR CHANGE  12/03/2011   MDT Adapta [id number] generator change by Dr Steven Williams for symptomatic bradycardia  . PACEMAKER IMPLANT  2004   MDT     FAMILY HISTORY History reviewed. No pertinent family history.  SOCIAL HISTORY Social History   Tobacco Use  . Smoking status: Former Smoker    Types: Cigarettes    Quit date: 1961    Years since quitting: 59.5  . Smokeless tobacco: Never Used  Substance Use Topics  . Alcohol use: Yes    Alcohol/week: 2.0 standard drinks    Types: 2 Glasses of wine per week    Comment: daily  . Drug use: Never         OPHTHALMIC EXAM:  Base Eye Exam    Visual Acuity (Snellen - Linear)      Right Left   Dist [place] CF at 3' 20/40   Dist ph [place] NI NI       Tonometry (Tonopen, 2:24 PM)      Right Left   Pressure 13 13       Pupils      Dark Light Shape React APD   Right 3 2 Round Brisk None   Left 3 2 Round Brisk None       Visual Fields      Left Right    Full    Restrictions  Partial inner superior temporal, inferior temporal, superior nasal, inferior nasal deficiencies       Extraocular Movement      Right Left    Full, Ortho Full, Ortho       Neuro/Psych    Oriented x3: Yes   Mood/Affect: Normal       Dilation    Both eyes: 1.0% Mydriacyl, 2.5% Phenylephrine @ 2:24 PM        Slit Lamp and Fundus  Exam    Slit Lamp Exam      Right Left   Lids/Lashes Dermatochalasis - upper lid, Meibomian gland dysfunction Dermatochalasis - upper lid, Meibomian gland dysfunction   Conjunctiva/Sclera White and  quiet White and quiet   Cornea Arcus, 2-3+ Punctate epithelial erosions, Well healed cataract wounds Arcus, 3-4+ Punctate epithelial erosions centrally, Well healed cataract wounds   Anterior Chamber Deep and quiet Deep and quiet   Iris Round and dilated Round and poorly dilated to 4.5   Lens Posterior chamber intraocular lens, trace Posterior capsular opacification Posterior chamber intraocular lens, trace Posterior capsular opacification   Vitreous Vitreous syneresis, Posterior vitreous detachment Vitreous syneresis       Fundus Exam      Right Left   Disc +cupping, trace pallor, sharp rim, 360 PPA 360 PPA, +cupping   C/D Ratio 0.7 0.7   Macula Flat, diffuse RPE atrophy, central pigment clumping, No heme or edema Flat, Blunted foveal reflex, Drusen, RPE mottling, clumping and atrophy, No heme or edema   Vessels Vascular attenuation Vascular attenuation, Tortuous   Periphery Attached, mild reticular degeneration, mild paving stone inferiorly Attached, mild reticular degeneration          IMAGING AND PROCEDURES  Imaging and Procedures for @TODAY @  OCT, Retina - OU - Both Eyes       Right Eye Quality was good. Central Foveal Thickness: 265. Progression has been stable. Findings include abnormal foveal contour, no IRF, no SRF, subretinal hyper-reflective material, retinal drusen , outer retinal atrophy, pigment epithelial detachment.   Left Eye Quality was good. Central Foveal Thickness: 280. Progression has been stable. Findings include abnormal foveal contour, no IRF, no SRF, outer retinal atrophy, retinal drusen , pigment epithelial detachment, subretinal hyper-reflective material (stable improvement in cystic changes).   Notes *Images captured and stored on drive  Diagnosis /  Impression:  Exudative ARMD OU -- inactive OU -- stable from prior  Clinical management:  See below  Abbreviations: NFP - Normal foveal profile. CME - cystoid macular edema. PED - pigment epithelial detachment. IRF - intraretinal fluid. SRF - subretinal fluid. EZ - ellipsoid zone. ERM - epiretinal membrane. ORA - outer retinal atrophy. ORT - outer retinal tubulation. SRHM - subretinal hyper-reflective material        Intravitreal Injection, Pharmacologic Agent - OS - Left Eye       Time Out 02/27/2019. 3:15 PM. Confirmed correct patient, procedure, site, and patient consented.   Anesthesia Topical anesthesia was used. Anesthetic medications included Lidocaine 2%, Proparacaine 0.5%.   Procedure Preparation included 5% betadine to ocular surface, eyelid speculum. A supplied needle was used.   Injection:  1.25 mg Bevacizumab (AVASTIN) SOLN   NDC: 57017-793-90, Lot: [id number] , Expiration date: 04/19/2019   Route: Intravitreal, Site: Left Eye, Waste: 0 mL  Post-op Post injection exam found visual acuity of at least counting fingers. The patient tolerated the procedure well. There were no complications. The patient received written and verbal post procedure care education.                 ASSESSMENT/PLAN:    ICD-10-CM   1. Exudative age-related macular degeneration of left eye with active choroidal neovascularization (HCC)  H35.3221 Intravitreal Injection, Pharmacologic Agent - OS - Left Eye    Bevacizumab (AVASTIN) SOLN 1.25 mg  2. Exudative age-related macular degeneration of right eye with inactive scar ([place])  H35.3213   3. Retinal edema  H35.81 OCT, Retina - OU - Both Eyes  4. Essential hypertension  I10   5. Hypertensive retinopathy of both eyes  H35.033   6. Pseudophakia of both eyes  Z96.1     1-3. 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    OD-  w/ inactive scar -- history of low vision, CF 3'  OS- w/ CNV   - history of multiple IVL OS in  [place], [place] w/ Dr. Doristine Church -- last injection 06/2018, q4-6 wk interval  - moved to [place] in November 2019 and has been followed by Dr. Baird Cancer who had recommended observation, thus pt sought second opinion  - given functional monocular status and history of active CNV, has been receiving maintenance injections, s/p IVA #1 OS (02.25.20), #2 (04.07.20), #3 (05.05.20)  - OCT shows PEDs, CNV w/ tr SRF OS -- stably improved; OD with inactive scar  - BCVA OS improved to 20/40 (up from 20/50 last visit 05.05.20)  - recommend IVA #4 OS today, 06.22.20, w/ f/u in 6-7 wks -- pt prefers aggressive maintenance given functional monocular stutus  - pt wishes to be treated with IVA OS  - RBA of procedure discussed, questions answered  - informed consent obtained and signed  - see procedure note  - f/u in 6-7 wks -- DFE, OCT  4,5. Hypertensive retinopathy OU - discussed importance of tight BP control - monitor  6. Pseudophakia OU  - s/p CE/IOL OU  - monitor   Ophthalmic Meds Ordered this visit:  Meds ordered this encounter  Medications  . Bevacizumab (AVASTIN) SOLN 1.25 mg       Return for f/u 6-7 weeks, exu ARMD OS, DFE, OCT.  There are no Patient Instructions on file for this visit.   Explained the diagnoses, plan, and follow up with the patient and they expressed understanding.  Patient expressed understanding of the importance of proper follow up care.   This document serves as a record of services personally performed by Gardiner Sleeper, MD, PhD. It was created on their behalf by Ernest Mallick, OA, an ophthalmic assistant. The creation of this record is the provider's dictation and/or activities during the visit.    Electronically signed by: Ernest Mallick, OA 06.21.2020 12:05 PM     Gardiner Sleeper, M.D., Ph.D. Diseases & Surgery of the Retina and Vitreous [facility]  I have reviewed the above documentation for accuracy and completeness, and I agree with  the above. Gardiner Sleeper, M.D., Ph.D. 02/28/19 12:05 PM   Abbreviations: M myopia (nearsighted); A astigmatism; H hyperopia (farsighted); P presbyopia; Mrx spectacle prescription;  CTL contact lenses; OD right eye; OS left eye; OU both eyes  XT exotropia; ET esotropia; PEK punctate epithelial keratitis; PEE punctate epithelial erosions; DES dry eye syndrome; MGD meibomian gland dysfunction; ATs artificial tears; PFAT's preservative free artificial tears; [place] nuclear sclerotic cataract; PSC posterior subcapsular cataract; ERM epi-retinal membrane; PVD posterior vitreous detachment; RD retinal detachment; DM diabetes mellitus; DR diabetic retinopathy; NPDR non-proliferative diabetic retinopathy; PDR proliferative diabetic retinopathy; CSME clinically significant macular edema; DME diabetic macular edema; dbh dot blot hemorrhages; CWS cotton wool spot; POAG primary open angle glaucoma; C/D cup-to-disc ratio; HVF humphrey visual field; GVF goldmann visual field; OCT optical coherence tomography; IOP intraocular pressure; BRVO Branch retinal vein occlusion; CRVO central retinal vein occlusion; CRAO central retinal artery occlusion; BRAO branch retinal artery occlusion; RT retinal tear; SB scleral buckle; PPV pars plana vitrectomy; VH Vitreous hemorrhage; PRP panretinal laser photocoagulation; IVK intravitreal kenalog; VMT vitreomacular traction; MH Macular hole;  NVD neovascularization of the disc; NVE neovascularization elsewhere; AREDS age related eye disease study; ARMD age related macular degeneration; POAG primary open angle glaucoma; EBMD epithelial/anterior basement membrane dystrophy; ACIOL anterior chamber intraocular lens; IOL intraocular lens; PCIOL  posterior chamber intraocular lens; Phaco/IOL phacoemulsification with intraocular lens placement; [place] photorefractive keratectomy; LASIK laser assisted in situ keratomileusis; HTN hypertension; DM diabetes mellitus; COPD chronic obstructive pulmonary  disease

## 2019-02-27 ENCOUNTER — Ambulatory Visit (INDEPENDENT_AMBULATORY_CARE_PROVIDER_SITE_OTHER): Payer: Medicare Other | Admitting: Ophthalmology

## 2019-02-27 ENCOUNTER — Encounter: Payer: Medicare Other | Admitting: *Deleted

## 2019-02-27 ENCOUNTER — Encounter (INDEPENDENT_AMBULATORY_CARE_PROVIDER_SITE_OTHER): Payer: Self-pay | Admitting: Ophthalmology

## 2019-02-27 ENCOUNTER — Other Ambulatory Visit: Payer: Self-pay

## 2019-02-27 DIAGNOSIS — H3581 Retinal edema: Secondary | ICD-10-CM | POA: Diagnosis not present

## 2019-02-27 DIAGNOSIS — Z961 Presence of intraocular lens: Secondary | ICD-10-CM

## 2019-02-27 DIAGNOSIS — H353221 Exudative age-related macular degeneration, left eye, with active choroidal neovascularization: Secondary | ICD-10-CM | POA: Diagnosis not present

## 2019-02-27 DIAGNOSIS — I10 Essential (primary) hypertension: Secondary | ICD-10-CM | POA: Diagnosis not present

## 2019-02-27 DIAGNOSIS — H353213 Exudative age-related macular degeneration, right eye, with inactive scar: Secondary | ICD-10-CM | POA: Diagnosis not present

## 2019-02-27 DIAGNOSIS — H35033 Hypertensive retinopathy, bilateral: Secondary | ICD-10-CM

## 2019-02-27 MED ORDER — BEVACIZUMAB CHEMO INJECTION 1.25MG/0.05ML SYRINGE FOR KALEIDOSCOPE
1.2500 mg | INTRAVITREAL | Status: AC | PRN
  Administered 2019-02-27: 1.25 mg via INTRAVITREAL

## 2019-02-28 ENCOUNTER — Telehealth: Payer: Self-pay

## 2019-02-28 NOTE — Telephone Encounter (Signed)
Unable to leave a message for patient to remind of missed remote transmission.  

## 2019-03-01 ENCOUNTER — Ambulatory Visit: Payer: Medicare Other | Admitting: Podiatry

## 2019-03-09 ENCOUNTER — Telehealth: Payer: Self-pay

## 2019-03-09 NOTE — Telephone Encounter (Signed)
Pt wanted to rescheduled his home remote appointment and I did for 03-13-2019. Pt wanted to know if he could take Mucinex sinus. I told him per Jonni Sanger dr. Rayann Heman PA he can take plain Mucinex but not anything with decongestant or DM in it. The pt verbalized understanding.

## 2019-03-13 ENCOUNTER — Encounter: Payer: Medicare Other | Admitting: *Deleted

## 2019-03-20 ENCOUNTER — Encounter: Payer: Self-pay | Admitting: Cardiology

## 2019-03-27 ENCOUNTER — Encounter: Payer: Self-pay | Admitting: Podiatry

## 2019-03-27 ENCOUNTER — Other Ambulatory Visit: Payer: Self-pay

## 2019-03-27 ENCOUNTER — Ambulatory Visit (INDEPENDENT_AMBULATORY_CARE_PROVIDER_SITE_OTHER): Payer: Medicare Other | Admitting: *Deleted

## 2019-03-27 ENCOUNTER — Ambulatory Visit (INDEPENDENT_AMBULATORY_CARE_PROVIDER_SITE_OTHER): Payer: Medicare Other | Admitting: Podiatry

## 2019-03-27 DIAGNOSIS — M79674 Pain in right toe(s): Secondary | ICD-10-CM | POA: Diagnosis not present

## 2019-03-27 DIAGNOSIS — R001 Bradycardia, unspecified: Secondary | ICD-10-CM | POA: Diagnosis not present

## 2019-03-27 DIAGNOSIS — M79675 Pain in left toe(s): Secondary | ICD-10-CM | POA: Diagnosis not present

## 2019-03-27 DIAGNOSIS — L84 Corns and callosities: Secondary | ICD-10-CM | POA: Diagnosis not present

## 2019-03-27 DIAGNOSIS — I739 Peripheral vascular disease, unspecified: Secondary | ICD-10-CM

## 2019-03-27 LAB — CUP PACEART REMOTE DEVICE CHECK
Battery Impedance: 1605 Ohm
Battery Remaining Longevity: 46 mo
Battery Voltage: 2.76 V
Brady Statistic RV Percent Paced: 6 %
Date Time Interrogation Session: 20200719220722
Implantable Lead Implant Date: 20041008
Implantable Lead Implant Date: 20041008
Implantable Lead Location: 753859
Implantable Lead Location: 753860
Implantable Lead Model: 5076
Implantable Lead Model: 5076
Implantable Pulse Generator Implant Date: 20130328
Lead Channel Impedance Value: 67 Ohm
Lead Channel Impedance Value: 803 Ohm
Lead Channel Setting Pacing Amplitude: 2.5 V
Lead Channel Setting Pacing Pulse Width: 1 ms
Lead Channel Setting Sensing Sensitivity: 4 mV

## 2019-03-27 NOTE — Patient Instructions (Signed)

## 2019-03-31 ENCOUNTER — Telehealth: Payer: Self-pay

## 2019-03-31 NOTE — Telephone Encounter (Signed)
I let the pt know that we did receive his transmission on 03-26-2019. The pt thanked me for calling to let him know.

## 2019-04-02 NOTE — Progress Notes (Addendum)
Subjective:  Kaius Daino presents to clinic today with cc of  painful, thick, discolored, elongated toenails 1-5 b/l that become tender and cannot cut because of thickness.  Pain is aggravated when wearing enclosed shoe gear.  Patient would like to discuss topical medication for his mycotic toenails. He would also like to discuss treatment options for his restless leg syndrome.  [name], MD is his PCP.    Allergies  Allergen Reactions  . Iodinated Diagnostic Agents Rash     Objective:  Physical Examination:  Vascular Examination: Capillary refill time<4 seconds x 10 digits.  Nonpalpable DP/PT pulses b/l.  Digital hair absent b/l.  No edema noted b/l.  Skin temperature gradient warm to cool b/l.  Dermatological Examination: Skin thin, shiny and atrophic b/l.  No open wounds b/l.  No interdigital macerations noted b/l.  Hyperkeratotic lesion submet head 1 right foot.  No edema, no erythema, no drainage, no flocculence. Callus resolved submet head 1 left foot.  Elongated, thick, discolored brittle toenails with subungual debris and pain on dorsal palpation of nailbeds 1-5 b/l.  Musculoskeletal Examination: Muscle strength 5/5 to all muscle groups b/l.  No pain, crepitus or joint discomfort with active/passive ROM.  Neurological Examination: Sensation intact 5/5 b/l with 10 gram monofilament.  Assessment: Mycotic nail infection with pain 1-5 b/l Calluses submet head 1 right foot PAD  Plan: 1. Toenails 1-5 b/l were debrided in length and girth without iatrogenic laceration. Discussed topical medications for onychomycosis. Rx written for nonformulary compounding topical antifungal: [facility]: Antifungal cream - Terbinafine 3%, Fluconazole 2%, Tea Tree Oil 5%, Urea 10%, Ibuprofen 2% in DMSO Suspension #22ml. Apply to the affected nail(s) at bedtime. 2. Calluses pared submetatarsal head 1 right foot utilizing sterile scalpel blade without  incident. 3. Advised Mr. Harte to discuss his RLS symptoms with his PCP for medication recommendations. 4. Continue soft, supportive shoe gear daily. Recommended Mr. Tavano purchase Skechers loafers with memory foam insoles and stretchable uppers. Information written on prescription pad for him on today's visit.  5. Report any pedal injuries to medical professional. 5.   Follow up 9 weeks 6.  Patient/POA to call should there be a question/concern in there interim.

## 2019-04-03 ENCOUNTER — Telehealth: Payer: Self-pay

## 2019-04-03 NOTE — Telephone Encounter (Signed)
Patient's voice mail box is full, so I am unable to contact him regarding his 13hr prep for his CT scan on 04/06/19.  I chose his [facility] in [place] since it is closer to his house than the [facility] [address] in [place].  I lmom with the pharmacy for Prednisone 50mg  PO 7/30 @ 0230, 0830 and 1430; Benadryl 50mg  PO 7/30 @ 1430.

## 2019-04-04 ENCOUNTER — Other Ambulatory Visit: Payer: Self-pay | Admitting: *Deleted

## 2019-04-04 DIAGNOSIS — Z91041 Radiographic dye allergy status: Secondary | ICD-10-CM

## 2019-04-04 MED ORDER — PREDNISONE 5 MG PO TABS: 5.0000 mg | ORAL_TABLET | Freq: Every day | ORAL | 0 refills | Status: DC

## 2019-04-06 ENCOUNTER — Ambulatory Visit
Admission: RE | Admit: 2019-04-06 | Discharge: 2019-04-06 | Disposition: A | Discharge status: Home / Self Care | Payer: Medicare Other | Source: Ambulatory Visit | Attending: Thoracic Surgery (Cardiothoracic Vascular Surgery) | Admitting: Thoracic Surgery (Cardiothoracic Vascular Surgery)

## 2019-04-06 ENCOUNTER — Other Ambulatory Visit: Payer: Medicare Other

## 2019-04-06 DIAGNOSIS — I712 Thoracic aortic aneurysm, without rupture, unspecified: Secondary | ICD-10-CM

## 2019-04-06 MED ORDER — IOPAMIDOL (ISOVUE-370) INJECTION 76%
75.0000 mL | Freq: Once | INTRAVENOUS | Status: AC | PRN
  Administered 2019-04-06: 75 mL via INTRAVENOUS

## 2019-04-09 NOTE — Progress Notes (Signed)
[facility] Note  04/10/2019     CHIEF COMPLAINT Patient presents for Retina Follow Up   HISTORY OF PRESENT ILLNESS: Steven Williams is a 85 y.o. male who presents to the clinic today for:   HPI    Retina Follow Up    Patient presents with  Dry AMD.  In left eye.  This started 5 months ago.  Severity is mild.  Since onset it is stable.  I, the attending physician,  performed the HPI with the patient and updated documentation appropriately.          Comments    F/U EXU  AMD OS. Patient states "I"m not sure if my vision is the same , or gotten worse, I can't tell ", denies flashes, floaters and ocular pain. Pt is using Dry eye gtt's PRN.        Last edited by [name], MD on 04/10/2019  1:49 PM. (History)    pt states he feels like his vision gets a little worse every time he comes in, he states he got new glasses since he was here last  Referring physician: [name], MD [place],   [id number]  HISTORICAL INFORMATION:   Selected notes from the [place] Referred by [name], [place] LEE:  Ocular Hx- PMH-    CURRENT MEDICATIONS: Current Outpatient Medications (Ophthalmic Drugs)  Medication Sig  . Ranibizumab (LUCENTIS IZ) by Intravitreal route every 30 (thirty) days. Patient is unsure of dose   No current facility-administered medications for this visit.  (Ophthalmic Drugs)   Current Outpatient Medications (Other)  Medication Sig  . allopurinol (ZYLOPRIM) 300 MG tablet Take 300 mg by mouth daily.  . carvedilol (COREG) 25 MG tablet Take 25 mg by mouth 2 (two) times daily with a meal.  . carvedilol (COREG) 6.25 MG tablet Take 6.25 mg by mouth 2 (two) times daily.  . Certolizumab Pegol (CIMZIA [place]) Inject into the skin every 30 (thirty) days. Patient is unsure of dose  . Cholecalciferol (VITAMIN D3) 50 MCG (2000 UT) TABS Take 1 tablet by mouth 2 (two) times daily.  Marland Kitchen dicyclomine (BENTYL) 10 MG  capsule Take 10 mg by mouth 4 (four) times daily.  . digoxin (LANOXIN) 0.125 MG tablet Take 0.125 mg by mouth as directed. Patient takes medication on M/W/F  . Ferrous Sulfate (IRON) 325 (65 Fe) MG TABS Take 1 tablet by mouth 2 (two) times daily.  . furosemide (LASIX) 40 MG tablet Take 40 mg by mouth daily.  Marland Kitchen gabapentin (NEURONTIN) 400 MG capsule Take 400 mg by mouth at bedtime.  Marland Kitchen levothyroxine (SYNTHROID, LEVOTHROID) 88 MCG tablet Take 88 mcg by mouth daily before breakfast.  . Misc Natural Products (GLUCOSAMINE CHOND COMPLEX/MSM PO) Take 1 tablet by mouth daily.  . Multiple Vitamins-Minerals (PRESERVISION AREDS PO) Take 1 tablet by mouth 2 (two) times daily.  . NON FORMULARY Antifungal nail cream from [facility] - faxed 03/27/2019  [facility]  . predniSONE (DELTASONE) 5 MG tablet Take 1 tablet (5 mg total) by mouth daily with breakfast. Take 5 tabs at 2:30 am on 04/07/19, 5 tabs at 8:30 am on 04/07/19 and 5 tabs at 2:30 pm on 04/07/19  . rosuvastatin (CRESTOR) 10 MG tablet Take 10 mg by mouth daily.  . saw palmetto 500 MG capsule Take 500 mg by mouth 2 (two) times daily.  . tamsulosin (FLOMAX) 0.4 MG CAPS capsule Take 0.4 mg by mouth daily.  . temazepam (  RESTORIL) 15 MG capsule Take 15 mg by mouth at bedtime as needed for sleep.  . traMADol (ULTRAM) 50 MG tablet TK 1 T PO   UP TO TID PRF SEVERE PAIN  . Turmeric 500 MG CAPS Take 1 capsule by mouth daily.  Marland Kitchen warfarin (COUMADIN) 2 MG tablet Take 2 mg by mouth daily.   Current Facility-Administered Medications (Other)  Medication Route  . Bevacizumab (AVASTIN) SOLN 1.25 mg Intravitreal      REVIEW OF SYSTEMS: ROS    Positive for: Eyes   Negative for: Constitutional, Gastrointestinal, Neurological, Skin, Genitourinary, Musculoskeletal, HENT, Endocrine, Cardiovascular, Respiratory, Psychiatric, Allergic/Imm, Heme/Lymph   Last edited by Zenovia Jordan, LPN on 03/14/4127  7:86 PM. (History)       ALLERGIES Allergies  Allergen  Reactions  . Iodinated Diagnostic Agents Rash    PAST MEDICAL HISTORY Past Medical History:  Diagnosis Date  . Antiphospholipid antibody positive   . Aortic aneurysm ([place])   . Aortic dissection ([place])   . Central retinal artery occlusion   . Chronic anemia   . Chronic renal insufficiency   . COPD (chronic obstructive pulmonary disease) ([place])   . DDD (degenerative disc disease), cervical   . DJD (degenerative joint disease)   . DVT (deep venous thrombosis) ([place])   . Gastric dysmotility   . GERD (gastroesophageal reflux disease)   . Gout   . HTN (hypertension)   . Hyperlipidemia   . Myelodysplasia (myelodysplastic syndrome) ([place])   . Paget's disease of bone   . Permanent atrial fibrillation   . Rheumatoid arthritis ([place])   . Symptomatic bradycardia   . TIA (transient ischemic attack)    Past Surgical History:  Procedure Laterality Date  . ATRIAL FIBRILLATION ABLATION  09/23/2014   Dr Encarnacion Chu in [place]  . BACK SURGERY    . CAROTID ENDARTERECTOMY    . CAROTID STENT    . CATARACT EXTRACTION Bilateral 2010   [place]  . EYE SURGERY    . LUNG DECORTICATION     [facility]  . PACEMAKER GENERATOR CHANGE  12/03/2011   MDT Adapta [id number] generator change by Dr Encarnacion Chu for symptomatic bradycardia  . PACEMAKER IMPLANT  2004   MDT     FAMILY HISTORY History reviewed. No pertinent family history.  SOCIAL HISTORY Social History   Tobacco Use  . Smoking status: Former Smoker    Types: Cigarettes    Quit date: 1961    Years since quitting: 59.6  . Smokeless tobacco: Never Used  Substance Use Topics  . Alcohol use: Yes    Alcohol/week: 2.0 standard drinks    Types: 2 Glasses of wine per week    Comment: daily  . Drug use: Never         OPHTHALMIC EXAM:  Base Eye Exam    Visual Acuity (Snellen - Linear)      Right Left   Dist cc CF at 3' 20/50   Dist ph cc NI 20/40   Correction: Glasses       Tonometry (Tonopen, 1:25 PM)      Right Left   Pressure 16 14        Pupils      Dark Light Shape React APD   Right 3 2 Round Slow None   Left 3 2 Round Slow None       Visual Fields (Counting fingers)      Left Right    Full Full       Extraocular Movement  Right Left    Full, Ortho Full, Ortho       Neuro/Psych    Oriented x3: Yes   Mood/Affect: Normal       Dilation    Both eyes: 1.0% Mydriacyl, 2.5% Phenylephrine @ 1:21 PM        Slit Lamp and Fundus Exam    Slit Lamp Exam      Right Left   Lids/Lashes Dermatochalasis - upper lid, Meibomian gland dysfunction Dermatochalasis - upper lid, Meibomian gland dysfunction   Conjunctiva/Sclera White and quiet White and quiet   Cornea Arcus, 2-3+ Punctate epithelial erosions, Well healed cataract wounds Arcus, 3-4+ Punctate epithelial erosions centrally, Well healed cataract wounds   Anterior Chamber Deep and quiet Deep and quiet   Iris Round and dilated Round and poorly dilated to 4.5   Lens Posterior chamber intraocular lens, trace Posterior capsular opacification Posterior chamber intraocular lens, trace Posterior capsular opacification   Vitreous Vitreous syneresis, Posterior vitreous detachment Vitreous syneresis       Fundus Exam      Right Left   Disc +cupping, trace pallor, sharp rim, 360 PPA 360 PPA, +cupping   C/D Ratio 0.7 0.7   Macula Flat, diffuse RPE atrophy, central pigment clumping, No heme or edema Flat, Blunted foveal reflex, Drusen, RPE mottling, clumping and atrophy, No heme or edema   Vessels Vascular attenuation Vascular attenuation, Tortuous   Periphery Attached, mild reticular degeneration, mild paving stone inferiorly Attached, mild reticular degeneration          IMAGING AND PROCEDURES  Imaging and Procedures for @TODAY @  OCT, Retina - OU - Both Eyes       Right Eye Quality was good. Central Foveal Thickness: 297. Progression has been stable. Findings include abnormal foveal contour, no IRF, no SRF, subretinal hyper-reflective material, retinal drusen ,  outer retinal atrophy, pigment epithelial detachment.   Left Eye Quality was good. Central Foveal Thickness: 275. Progression has been stable. Findings include abnormal foveal contour, no IRF, no SRF, outer retinal atrophy, retinal drusen , pigment epithelial detachment, subretinal hyper-reflective material (stable improvement in cystic changes).   Notes *Images captured and stored on drive  Diagnosis / Impression:  Exudative ARMD OU -- inactive OU -- stable from prior OD: inactive atrophic macular scar, NFP, no IRF/SRF  Clinical management:  See below  Abbreviations: NFP - Normal foveal profile. CME - cystoid macular edema. PED - pigment epithelial detachment. IRF - intraretinal fluid. SRF - subretinal fluid. EZ - ellipsoid zone. ERM - epiretinal membrane. ORA - outer retinal atrophy. ORT - outer retinal tubulation. SRHM - subretinal hyper-reflective material        Intravitreal Injection, Pharmacologic Agent - OS - Left Eye       Time Out 04/10/2019. 1:44 PM. Confirmed correct patient, procedure, site, and patient consented.   Anesthesia Topical anesthesia was used. Anesthetic medications included Lidocaine 2%, Proparacaine 0.5%.   Procedure Preparation included 5% betadine to ocular surface, eyelid speculum. A 30 gauge needle was used.   Injection:  1.25 mg Bevacizumab (AVASTIN) SOLN   NDC: 09983-382-50, Lot: [id number] , Expiration date: 05/17/2019   Route: Intravitreal, Site: Left Eye, Waste: 0 mg                 ASSESSMENT/PLAN:    ICD-10-CM   1. Exudative age-related macular degeneration of left eye with active choroidal neovascularization (HCC)  H35.3221 Intravitreal Injection, Pharmacologic Agent - OS - Left Eye    Bevacizumab (AVASTIN) SOLN 1.25 mg  2.  Exudative age-related macular degeneration of right eye with inactive scar ([place])  H35.3213   3. Retinal edema  H35.81 OCT, Retina - OU - Both Eyes  4. Essential hypertension  I10   5. Hypertensive  retinopathy of both eyes  H35.033   6. Pseudophakia of both eyes  Z96.1     1-3. 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    OD- w/ inactive scar -- history of low vision, CF 3'  OS- w/ CNV   - history of multiple IVL OS in [place], [place] w/ Dr. Doristine Church -- last injection 06/2018, q4-6 wk interval  - moved to [place] in November 2019 and has been followed by Dr. Baird Cancer who had recommended observation, thus pt sought second opinion  - given functional monocular status and history of active CNV, has been receiving maintenance injections, s/p IVA #1 OS (02.25.20), #2 (04.07.20), #3 (05.05.20), #4 (06.22.20)  - OCT shows PEDs, CNV w/ tr SRF OS -- stably improved; OD with inactive scar  - BCVA OS improved to 20/40 (up from 20/50 last visit 05.05.20)  - recommend IVA #5 OS today, 08.03.20, w/ f/u in 6-7 wks -- pt prefers aggressive maintenance given functional monocular stutus  - pt wishes to be treated with IVA OS  - RBA of procedure discussed, questions answered  - informed consent obtained and signed  - see procedure note  - f/u in 6 wks -- DFE, OCT  4,5. Hypertensive retinopathy OU  - discussed importance of tight BP control  - monitor  6. Pseudophakia OU  - s/p CE/IOL OU  - monitor   Ophthalmic Meds Ordered this visit:  Meds ordered this encounter  Medications  . Bevacizumab (AVASTIN) SOLN 1.25 mg       Return in about 6 weeks (around 05/22/2019) for f/u exu ARMD OS, Dilated Exam, OCT.  There are no Patient Instructions on file for this visit.   Explained the diagnoses, plan, and follow up with the patient and they expressed understanding.  Patient expressed understanding of the importance of proper follow up care.   This document serves as a record of services personally performed by Gardiner Sleeper, MD, PhD. It was created on their behalf by Ernest Mallick, OA, an ophthalmic assistant. The creation of this record is the provider's dictation and/or activities  during the visit.    Electronically signed by: Ernest Mallick, OA  08.02.2020 1:50 PM    Gardiner Sleeper, M.D., Ph.D. Diseases & Surgery of the Retina and Vitreous [facility]  I have reviewed the above documentation for accuracy and completeness, and I agree with the above. Gardiner Sleeper, M.D., Ph.D. 04/10/19 1:50 PM    Abbreviations: M myopia (nearsighted); A astigmatism; H hyperopia (farsighted); P presbyopia; Mrx spectacle prescription;  CTL contact lenses; OD right eye; OS left eye; OU both eyes  XT exotropia; ET esotropia; PEK punctate epithelial keratitis; PEE punctate epithelial erosions; DES dry eye syndrome; MGD meibomian gland dysfunction; ATs artificial tears; PFAT's preservative free artificial tears; [place] nuclear sclerotic cataract; PSC posterior subcapsular cataract; ERM epi-retinal membrane; PVD posterior vitreous detachment; RD retinal detachment; DM diabetes mellitus; DR diabetic retinopathy; NPDR non-proliferative diabetic retinopathy; PDR proliferative diabetic retinopathy; CSME clinically significant macular edema; DME diabetic macular edema; dbh dot blot hemorrhages; CWS cotton wool spot; POAG primary open angle glaucoma; C/D cup-to-disc ratio; HVF humphrey visual field; GVF goldmann visual field; OCT optical coherence tomography; IOP intraocular pressure; BRVO Branch retinal vein occlusion; CRVO central  retinal vein occlusion; CRAO central retinal artery occlusion; BRAO branch retinal artery occlusion; RT retinal tear; SB scleral buckle; PPV pars plana vitrectomy; VH Vitreous hemorrhage; PRP panretinal laser photocoagulation; IVK intravitreal kenalog; VMT vitreomacular traction; MH Macular hole;  NVD neovascularization of the disc; NVE neovascularization elsewhere; AREDS age related eye disease study; ARMD age related macular degeneration; POAG primary open angle glaucoma; EBMD epithelial/anterior basement membrane dystrophy; ACIOL anterior chamber  intraocular lens; IOL intraocular lens; PCIOL posterior chamber intraocular lens; Phaco/IOL phacoemulsification with intraocular lens placement; [place] photorefractive keratectomy; LASIK laser assisted in situ keratomileusis; HTN hypertension; DM diabetes mellitus; COPD chronic obstructive pulmonary disease

## 2019-04-10 ENCOUNTER — Encounter: Payer: Self-pay | Admitting: Cardiology

## 2019-04-10 ENCOUNTER — Ambulatory Visit (INDEPENDENT_AMBULATORY_CARE_PROVIDER_SITE_OTHER): Payer: Medicare Other | Admitting: Ophthalmology

## 2019-04-10 ENCOUNTER — Other Ambulatory Visit: Payer: Self-pay

## 2019-04-10 ENCOUNTER — Encounter (INDEPENDENT_AMBULATORY_CARE_PROVIDER_SITE_OTHER): Payer: Self-pay | Admitting: Ophthalmology

## 2019-04-10 DIAGNOSIS — H3581 Retinal edema: Secondary | ICD-10-CM | POA: Diagnosis not present

## 2019-04-10 DIAGNOSIS — H353221 Exudative age-related macular degeneration, left eye, with active choroidal neovascularization: Secondary | ICD-10-CM | POA: Diagnosis not present

## 2019-04-10 DIAGNOSIS — H353213 Exudative age-related macular degeneration, right eye, with inactive scar: Secondary | ICD-10-CM | POA: Diagnosis not present

## 2019-04-10 DIAGNOSIS — Z961 Presence of intraocular lens: Secondary | ICD-10-CM

## 2019-04-10 DIAGNOSIS — I10 Essential (primary) hypertension: Secondary | ICD-10-CM | POA: Diagnosis not present

## 2019-04-10 DIAGNOSIS — H35033 Hypertensive retinopathy, bilateral: Secondary | ICD-10-CM

## 2019-04-10 MED ORDER — BEVACIZUMAB CHEMO INJECTION 1.25MG/0.05ML SYRINGE FOR KALEIDOSCOPE
1.2500 mg | INTRAVITREAL | Status: AC | PRN
  Administered 2019-04-10: 1.25 mg via INTRAVITREAL

## 2019-04-10 NOTE — Progress Notes (Signed)
Remote pacemaker transmission.   

## 2019-04-11 ENCOUNTER — Ambulatory Visit (INDEPENDENT_AMBULATORY_CARE_PROVIDER_SITE_OTHER): Payer: Medicare Other | Admitting: Thoracic Surgery (Cardiothoracic Vascular Surgery)

## 2019-04-11 ENCOUNTER — Encounter: Payer: Self-pay | Admitting: Thoracic Surgery (Cardiothoracic Vascular Surgery)

## 2019-04-11 VITALS — BP 118/67 | HR 78 | Temp 97.0°F | Resp 18 | Ht 73.0 in | Wt 174.0 lb

## 2019-04-11 DIAGNOSIS — Z8679 Personal history of other diseases of the circulatory system: Secondary | ICD-10-CM

## 2019-04-11 DIAGNOSIS — I712 Thoracic aortic aneurysm, without rupture: Secondary | ICD-10-CM

## 2019-04-11 DIAGNOSIS — I719 Aortic aneurysm of unspecified site, without rupture: Secondary | ICD-10-CM

## 2019-04-11 DIAGNOSIS — I7121 Aneurysm of the ascending aorta, without rupture: Secondary | ICD-10-CM

## 2019-04-11 NOTE — Progress Notes (Signed)
[address]       [place] [address]             [phone]    HPI: Mr. Steven Williams returns for a scheduled follow-up visit  Steven Williams is an 85 year old man with a past medical history of hypertension, hyperlipidemia, aortic dissection, descending thoracic aortic aneurysm, aortic atherosclerosis, heart murmur, bradycardia, permanent pacemaker, permanent atrial fibrillation, DVT, Paget's disease, rheumatoid arthritis, TIA, COPD, chronic kidney disease, and myelodysplasia.  Last year he moved from [place] to [place].  He reported an aortic dissection 8 years prior to his move.  He was being followed once a year.  I saw him in January.  He had a 4.8 cm descending aneurysm.  This appeared to be more consistent with a penetrating ulcer/intramural hematoma than an aortic dissection.  His ascending aorta was borderline aneurysmal at around 4 cm.  He denies any chest pain, pressure, or tightness.  He is not had any unusual back pain although he does have chronic upper and lower back pain.  He walks with a cane or rolling walker.  He does get short of breath with exertion.  That is unchanged.  Past Medical History:  Diagnosis Date  . Antiphospholipid antibody positive   . Aortic aneurysm ([place])   . Aortic dissection ([place])   . Central retinal artery occlusion   . Chronic anemia   . Chronic renal insufficiency   . COPD (chronic obstructive pulmonary disease) ([place])   . DDD (degenerative disc disease), cervical   . DJD (degenerative joint disease)   . DVT (deep venous thrombosis) ([place])   . Gastric dysmotility   . GERD (gastroesophageal reflux disease)   . Gout   . HTN (hypertension)   . Hyperlipidemia   . Myelodysplasia (myelodysplastic syndrome) ([place])   . Paget's disease of bone   . Permanent atrial fibrillation   . Rheumatoid arthritis ([place])   . Symptomatic bradycardia   . TIA (transient ischemic attack)     Current Outpatient Medications   Medication Sig Dispense Refill  . allopurinol (ZYLOPRIM) 300 MG tablet Take 300 mg by mouth daily.    . carvedilol (COREG) 25 MG tablet Take 25 mg by mouth 2 (two) times daily with a meal.    . carvedilol (COREG) 6.25 MG tablet Take 6.25 mg by mouth 2 (two) times daily.    . Certolizumab Pegol (CIMZIA [place]) Inject into the skin every 30 (thirty) days. Patient is unsure of dose    . Cholecalciferol (VITAMIN D3) 50 MCG (2000 UT) TABS Take 1 tablet by mouth 2 (two) times daily.    Marland Kitchen dicyclomine (BENTYL) 10 MG capsule Take 10 mg by mouth 4 (four) times daily.    . digoxin (LANOXIN) 0.125 MG tablet Take 0.125 mg by mouth as directed. Patient takes medication on M/W/F    . Ferrous Sulfate (IRON) 325 (65 Fe) MG TABS Take 1 tablet by mouth 2 (two) times daily.    . furosemide (LASIX) 40 MG tablet Take 40 mg by mouth daily.    Marland Kitchen gabapentin (NEURONTIN) 400 MG capsule Take 400 mg by mouth at bedtime.    Marland Kitchen levothyroxine (SYNTHROID, LEVOTHROID) 88 MCG tablet Take 88 mcg by mouth daily before breakfast.    . Misc Natural Products (GLUCOSAMINE CHOND COMPLEX/MSM PO) Take 1 tablet by mouth daily.    . Multiple Vitamins-Minerals (PRESERVISION AREDS PO) Take 1 tablet by mouth 2 (two) times daily.    . NON FORMULARY  Antifungal nail cream from [facility] - faxed 03/27/2019  [facility]    . predniSONE (DELTASONE) 5 MG tablet Take 1 tablet (5 mg total) by mouth daily with breakfast. Take 5 tabs at 2:30 am on 04/07/19, 5 tabs at 8:30 am on 04/07/19 and 5 tabs at 2:30 pm on 04/07/19 15 tablet 0  . Ranibizumab (LUCENTIS IZ) by Intravitreal route every 30 (thirty) days. Patient is unsure of dose    . rosuvastatin (CRESTOR) 10 MG tablet Take 10 mg by mouth daily.    . saw palmetto 500 MG capsule Take 500 mg by mouth 2 (two) times daily.    . tamsulosin (FLOMAX) 0.4 MG CAPS capsule Take 0.4 mg by mouth daily.    . temazepam (RESTORIL) 15 MG capsule Take 15 mg by mouth at bedtime as needed for sleep.    . traMADol (ULTRAM)  50 MG tablet TK 1 T PO   UP TO TID PRF SEVERE PAIN    . Turmeric 500 MG CAPS Take 1 capsule by mouth daily.    Marland Kitchen warfarin (COUMADIN) 2 MG tablet Take 2 mg by mouth daily.     Current Facility-Administered Medications  Medication Dose Route Frequency Provider Last Rate Last Dose  . Bevacizumab (AVASTIN) SOLN 1.25 mg  1.25 mg Intravitreal  [name], MD   1.25 mg at 11/01/18 2343    Physical Exam BP 118/67 (BP Location: Right Arm, Patient Position: Sitting, Cuff Size: Normal)   Pulse 78   Temp (!) 97 F (36.1 C)   Resp 18   Ht 6\' 1"  (1.854 m)   Wt 174 lb (78.9 kg)   SpO2 96% Comment: RA  BMI 22.16 kg/m  85 year old man in no acute distress Alert and oriented x3 with no focal deficits Cardiac regular rate and rhythm with 2/6 systolic murmur throughout precordium No carotid bruits Lungs clear with equal breath sounds  Diagnostic Tests: CT ANGIOGRAPHY CHEST WITH CONTRAST  TECHNIQUE: Multidetector CT imaging of the chest was performed using the standard protocol during bolus administration of intravenous contrast. Multiplanar CT image reconstructions and MIPs were obtained to evaluate the vascular anatomy.  CONTRAST:  43mL ISOVUE-370 IOPAMIDOL (ISOVUE-370) INJECTION 76%  COMPARISON:  08/09/2018  FINDINGS: Cardiovascular: Cardiomegaly with biatrial enlargement. No pericardial effusion. Transvenous pacing leads extend to the right atrium and right ventricular apex.  The RV is nondilated. There is fair contrast opacification of pulmonary artery branches with no convincing filling defects to indicate acute PE. The exam was not optimized for detection of pulmonary emboli.  Mitral annulus calcifications.  Moderate coronary calcifications.  Good contrast opacification of the thoracic aorta. Maximum transverse dimensions as follows:  3.8 cm sinuses of Valsalva  3.4 cm sino-tubular junction  4 cm proximal ascending  3.6 cm distal ascending/proximal arch   3.6 cm distal arch  4.4 cm proximal descending  4.8 cm mid descending at the site of penetrating atheromatous ulcer (previously 4.8)  4.4 cm distal descending  4 cm supraceliac abdominal aorta.  Stable appearance of mid descending penetrating atheromatous ulcer since prior study. No evidence of intramural hematoma.  Classic 3 vessel brachiocephalic arterial origin anatomy without proximal stenosis.  Mediastinum/Nodes: No hilar or mediastinal adenopathy.  Lungs/Pleura: No pleural effusion no pneumothorax. Pleural-based scarring/atelectasis in the medial and posterior right lower lobe. Calcified granuloma in the posterior left lower lobe. Stable stable 5 mm nodule in the right middle lobe image 87/6.  Upper Abdomen: Low-attenuation liver lesions, possibly cysts, stable. Punctate splenic calcified granulomas. No acute  findings.  Musculoskeletal: Anterior vertebral endplate spurring at multiple levels in the mid and lower thoracic spine. No fracture or worrisome bone lesion.  Review of the MIP images confirms the above findings.  IMPRESSION: 1. 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 Recommend semi-annual imaging followup by CTA or MRA and referral to cardiothoracic surgery if not already obtained. This recommendation follows 2010 ACCF/AHA/AATS/ACR/ASA/SCA/SCAI/SIR/STS/SVM Guidelines for the Diagnosis and Management of Patients With Thoracic Aortic Disease. Circulation. 2010; 121: e266-e36 2. Coronary calcifications. The severity of coronary artery disease and any potential stenosis cannot be assessed on this non-gated CT examination. Assessment for potential risk factor modification, dietary therapy or pharmacologic therapy may be warranted, if clinically indicated.   Electronically Signed   By: Lucrezia Europe M.D.   On: 04/07/2019 08:23  I personally reviewed the CT images and  concur with the findings noted above  Impression: Steven Williams is an 85 year old gentleman with an extensive past medical history which includes hypertension, hyperlipidemia, aortic dissection, descending thoracic aortic aneurysm, aortic atherosclerosis, heart murmur, bradycardia, permanent pacemaker, permanent atrial fibrillation, DVT, Paget's disease, rheumatoid arthritis, TIA, COPD, chronic kidney disease, and myelodysplasia.  Ascending aneurysm-stable at around 4 cm.  Unlikely to ever need intervention.  Descending aortic atherosclerosis/penetrating ulcer/intramural hematoma-stable at 4.8 cm.  No indication for intervention at this time.  Needs continued semiannual follow-up.  Hypertension-blood pressure well controlled on current regimen.  Heart murmur-no significant valvular pathology on echo in January 2020.  Plan: Return in 6 months with CT Angio of chest  Melrose Nakayama, MD [facility] [phone]

## 2019-05-19 DIAGNOSIS — G2581 Restless legs syndrome: Secondary | ICD-10-CM | POA: Insufficient documentation

## 2019-05-21 NOTE — Progress Notes (Signed)
[facility] Note  05/22/2019     CHIEF COMPLAINT Patient presents for Retina Follow Up   HISTORY OF PRESENT ILLNESS: Steven Williams is a 85 y.o. male who presents to the clinic today for:   HPI    Retina Follow Up    Patient presents with  Wet AMD.  In left eye.  Severity is moderate.  Duration of 6 weeks.  Since onset it is gradually worsening.  I, the attending physician,  performed the HPI with the patient and updated documentation appropriately.          Comments    Patient states vision getting worse OS. No eye pain.        Last edited by [name], MD on 05/22/2019  1:05 PM. (History)    pt states when he reads the newspaper he needs a magnifying glass, he states in his lower left vision it feels like he doesn't see anything  Referring physician: [name], MD [place],  [place] [address]  HISTORICAL INFORMATION:   Selected notes from the [facility] Referred by [name], [place] LEE:  Ocular Hx- PMH-    CURRENT MEDICATIONS: Current Outpatient Medications (Ophthalmic Drugs)  Medication Sig  . Ranibizumab (LUCENTIS IZ) by Intravitreal route every 30 (thirty) days. Patient is unsure of dose   No current facility-administered medications for this visit.  (Ophthalmic Drugs)   Current Outpatient Medications (Other)  Medication Sig  . allopurinol (ZYLOPRIM) 300 MG tablet Take 300 mg by mouth daily.  . carvedilol (COREG) 25 MG tablet Take 25 mg by mouth 2 (two) times daily with a meal.  . carvedilol (COREG) 6.25 MG tablet Take 6.25 mg by mouth 2 (two) times daily.  . Certolizumab Pegol (CIMZIA [place]) Inject into the skin every 30 (thirty) days. Patient is unsure of dose  . Cholecalciferol (VITAMIN D3) 50 MCG (2000 UT) TABS Take 1 tablet by mouth 2 (two) times daily.  Marland Kitchen dicyclomine (BENTYL) 10 MG capsule Take 10 mg by mouth 4 (four) times daily.  . digoxin (LANOXIN) 0.125 MG tablet Take 0.125 mg by  mouth as directed. Patient takes medication on M/W/F  . Ferrous Sulfate (IRON) 325 (65 Fe) MG TABS Take 1 tablet by mouth 2 (two) times daily.  . furosemide (LASIX) 40 MG tablet Take 40 mg by mouth daily.  Marland Kitchen gabapentin (NEURONTIN) 400 MG capsule Take 400 mg by mouth at bedtime.  Marland Kitchen levothyroxine (SYNTHROID, LEVOTHROID) 88 MCG tablet Take 88 mcg by mouth daily before breakfast.  . Misc Natural Products (GLUCOSAMINE CHOND COMPLEX/MSM PO) Take 1 tablet by mouth daily.  . Multiple Vitamins-Minerals (PRESERVISION AREDS PO) Take 1 tablet by mouth 2 (two) times daily.  . NON FORMULARY Antifungal nail cream from [place] - faxed 03/27/2019  [facility]  . predniSONE (DELTASONE) 5 MG tablet Take 1 tablet (5 mg total) by mouth daily with breakfast. Take 5 tabs at 2:30 am on 04/07/19, 5 tabs at 8:30 am on 04/07/19 and 5 tabs at 2:30 pm on 04/07/19  . rosuvastatin (CRESTOR) 10 MG tablet Take 10 mg by mouth daily.  . saw palmetto 500 MG capsule Take 500 mg by mouth 2 (two) times daily.  . tamsulosin (FLOMAX) 0.4 MG CAPS capsule Take 0.4 mg by mouth daily.  . temazepam (RESTORIL) 15 MG capsule Take 15 mg by mouth at bedtime as needed for sleep.  . traMADol (ULTRAM) 50 MG tablet TK 1 T PO   UP  TO TID PRF SEVERE PAIN  . Turmeric 500 MG CAPS Take 1 capsule by mouth daily.  Marland Kitchen warfarin (COUMADIN) 2 MG tablet Take 2 mg by mouth daily.   Current Facility-Administered Medications (Other)  Medication Route  . Bevacizumab (AVASTIN) SOLN 1.25 mg Intravitreal      REVIEW OF SYSTEMS: ROS    Positive for: Musculoskeletal, Endocrine, Cardiovascular, Eyes   Negative for: Constitutional, Gastrointestinal, Neurological, Skin, Genitourinary, HENT, Respiratory, Psychiatric, Allergic/Imm, Heme/Lymph   Last edited by Roselee Nova D, COT on 05/22/2019 12:45 PM. (History)       ALLERGIES Allergies  Allergen Reactions  . Iodinated Diagnostic Agents Rash    PAST MEDICAL HISTORY Past Medical History:  Diagnosis  Date  . Antiphospholipid antibody positive   . Aortic aneurysm ([place])   . Aortic dissection ([place])   . Central retinal artery occlusion   . Chronic anemia   . Chronic renal insufficiency   . COPD (chronic obstructive pulmonary disease) ([place])   . DDD (degenerative disc disease), cervical   . DJD (degenerative joint disease)   . DVT (deep venous thrombosis) ([place])   . Gastric dysmotility   . GERD (gastroesophageal reflux disease)   . Gout   . HTN (hypertension)   . Hyperlipidemia   . Myelodysplasia (myelodysplastic syndrome) ([place])   . Paget's disease of bone   . Permanent atrial fibrillation   . Rheumatoid arthritis ([place])   . Symptomatic bradycardia   . TIA (transient ischemic attack)    Past Surgical History:  Procedure Laterality Date  . ATRIAL FIBRILLATION ABLATION  09/23/2014   Dr Encarnacion Chu in [place]  . BACK SURGERY    . CAROTID ENDARTERECTOMY    . CAROTID STENT    . CATARACT EXTRACTION Bilateral 2010   [place]  . EYE SURGERY    . LUNG DECORTICATION     [facility]  . PACEMAKER GENERATOR CHANGE  12/03/2011   MDT [place] [id number] generator change by Dr Encarnacion Chu for symptomatic bradycardia  . PACEMAKER IMPLANT  2004   MDT     FAMILY HISTORY History reviewed. No pertinent family history.  SOCIAL HISTORY Social History   Tobacco Use  . Smoking status: Former Smoker    Types: Cigarettes    Quit date: 1961    Years since quitting: 59.7  . Smokeless tobacco: Never Used  Substance Use Topics  . Alcohol use: Yes    Alcohol/week: 2.0 standard drinks    Types: 2 Glasses of wine per week    Comment: daily  . Drug use: Never         OPHTHALMIC EXAM:  Base Eye Exam    Visual Acuity (Snellen - Linear)      Right Left   Dist cc CF at 3' 20/60 +1   Dist ph cc NI 20/50 +2   Correction: Glasses       Tonometry (Tonopen, 12:57 PM)      Right Left   Pressure 14 14       Pupils      Dark Light Shape React APD   Right 3 2 Round Slow +1   Left 3 2 Round Brisk None        Visual Fields (Counting fingers)      Left Right    Full    Restrictions  Central scotoma       Extraocular Movement      Right Left    Full, Ortho Full, Ortho       Neuro/Psych  Oriented x3: Yes   Mood/Affect: Normal       Dilation    Both eyes: 1.0% Mydriacyl, 2.5% Phenylephrine @ 12:57 PM        Slit Lamp and Fundus Exam    Slit Lamp Exam      Right Left   Lids/Lashes Dermatochalasis - upper lid, Meibomian gland dysfunction Dermatochalasis - upper lid, Meibomian gland dysfunction   Conjunctiva/Sclera White and quiet White and quiet   Cornea Arcus, 2-3+ Punctate epithelial erosions, Well healed cataract wounds Arcus, 3-4+ Punctate epithelial erosions centrally, Well healed cataract wounds   Anterior Chamber Deep and quiet Deep and quiet   Iris Round and dilated Round and poorly dilated to 4.5   Lens Posterior chamber intraocular lens, trace Posterior capsular opacification Posterior chamber intraocular lens, trace Posterior capsular opacification   Vitreous Vitreous syneresis, Posterior vitreous detachment Vitreous syneresis       Fundus Exam      Right Left   Disc +cupping, trace pallor, sharp rim, 360 PPA 360 PPA, +cupping   C/D Ratio 0.7 0.7   Macula Flat, diffuse RPE atrophy, central pigment clumping, No heme or edema Flat, Blunted foveal reflex, Drusen, RPE mottling, clumping and atrophy, No edema, ?punctate heme   Vessels Vascular attenuation Vascular attenuation, Tortuous   Periphery Attached, mild reticular degeneration, mild paving stone inferiorly Attached, mild reticular degeneration        Refraction    Wearing Rx      Sphere Cylinder Axis Add   Right -1.75 +2.25 005 +2.50   Left -1.50 +2.50 175 +2.50   Type: PAL       Manifest Refraction      Sphere Cylinder Axis Dist VA   Right       Left -1.00 +2.50 175 20/40-1          IMAGING AND PROCEDURES  Imaging and Procedures for @TODAY @  OCT, Retina - OU - Both Eyes       Right Eye Quality  was good. Central Foveal Thickness: 260. Progression has been stable. Findings include abnormal foveal contour, no IRF, no SRF, subretinal hyper-reflective material, retinal drusen , outer retinal atrophy, pigment epithelial detachment.   Left Eye Quality was good. Central Foveal Thickness: 281. Progression has been stable. Findings include abnormal foveal contour, no SRF, outer retinal atrophy, retinal drusen , pigment epithelial detachment, subretinal hyper-reflective material, intraretinal fluid (Mild interval increase in IRF).   Notes *Images captured and stored on drive  Diagnosis / Impression:  Exudative ARMD OU OD: +atrophic macular scar OS: Mild interval increase in IRF/cystic changes  Clinical management:  See below  Abbreviations: NFP - Normal foveal profile. CME - cystoid macular edema. PED - pigment epithelial detachment. IRF - intraretinal fluid. SRF - subretinal fluid. EZ - ellipsoid zone. ERM - epiretinal membrane. ORA - outer retinal atrophy. ORT - outer retinal tubulation. SRHM - subretinal hyper-reflective material        Intravitreal Injection, Pharmacologic Agent - OS - Left Eye       Time Out 05/22/2019. 1:03 PM. Confirmed correct patient, procedure, site, and patient consented.   Anesthesia Topical anesthesia was used. Anesthetic medications included Lidocaine 2%, Proparacaine 0.5%.   Procedure Preparation included 5% betadine to ocular surface, eyelid speculum. A 30 gauge needle was used.   Injection:  2 mg aflibercept Alfonse Flavors) SOLN   NDC: 88502-774-12, Lot: [id number], Expiration date: 08/27/2019   Route: Intravitreal, Site: Left Eye, Waste: 0.05 mL  Post-op Post injection exam found visual acuity  of at least counting fingers. The patient tolerated the procedure well. There were no complications. The patient received written and verbal post procedure care education.   Notes **SAMPLE MEDICATION ADMINISTERED**                  ASSESSMENT/PLAN:    ICD-10-CM   1. Exudative age-related macular degeneration of left eye with active choroidal neovascularization (HCC)  H35.3221 Intravitreal Injection, Pharmacologic Agent - OS - Left Eye    aflibercept (EYLEA) SOLN 2 mg  2. Exudative age-related macular degeneration of right eye with inactive scar ([place])  H35.3213   3. Retinal edema  H35.81 OCT, Retina - OU - Both Eyes  4. Essential hypertension  I10   5. Hypertensive retinopathy of both eyes  H35.033   6. Pseudophakia of both eyes  Z96.1     1-3. 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    OD- w/ inactive scar -- history of low vision, CF 3'  OS- w/ CNV   - history of multiple IVL OS in [place], [place] w/ Dr. Doristine Church -- last injection 06/2018, q4-6 wk interval  - moved to [place] in November 2019 and has been followed by Dr. Baird Cancer who had recommended observation, thus pt sought second opinion  - given functional monocular status and history of active CNV, has been receiving maintenance injections, s/p IVA #1 OS (02.25.20), #2 (04.07.20), #3 (05.05.20), #4 (06.22.20), #5 (08.03.20)  - OCT shows PEDs, CNV w/ tr SRF OS -- stably improved; OD with inactive scar  - BCVA OS 20/50+2 slightly down from 20/40  - discussed treatment options, pt wishes to try switch in medication to [facility]  - recommend IVE #1 OS today, 09.14.20, w/ f/u in 5 wks -- pt prefers aggressive maintenance given functional monocular stutus  - pt wishes to be treated with IVE OS  - RBA of procedure discussed, questions answered  - informed consent obtained and signed  - see procedure note -- sample IVE given today  - Eylea4U benefits investigation started 9.14.20  - f/u in 5 wks -- DFE, OCT  4,5. Hypertensive retinopathy OU  - discussed importance of tight BP control  - monitor  6. Pseudophakia OU  - s/p CE/IOL OU  - monitor   Ophthalmic Meds Ordered this visit:  Meds ordered this encounter  Medications  . aflibercept (EYLEA)  SOLN 2 mg       Return in about 5 weeks (around 06/26/2019) for f/u exu ARMD OU, DFE, OCT.  There are no Patient Instructions on file for this visit.   Explained the diagnoses, plan, and follow up with the patient and they expressed understanding.  Patient expressed understanding of the importance of proper follow up care.   This document serves as a record of services personally performed by Gardiner Sleeper, MD, PhD. It was created on their behalf by Ernest Mallick, OA, an ophthalmic assistant. The creation of this record is the provider's dictation and/or activities during the visit.    Electronically signed by: Ernest Mallick, OA  09.13.2020 4:51 PM     Gardiner Sleeper, M.D., Ph.D. Diseases & Surgery of the Retina and Vitreous [facility]  I have reviewed the above documentation for accuracy and completeness, and I agree with the above. Gardiner Sleeper, M.D., Ph.D. 05/22/19 4:51 PM     Abbreviations: M myopia (nearsighted); A astigmatism; H hyperopia (farsighted); P presbyopia; Mrx spectacle prescription;  CTL contact lenses; OD right eye; OS  left eye; OU both eyes  XT exotropia; ET esotropia; PEK punctate epithelial keratitis; PEE punctate epithelial erosions; DES dry eye syndrome; MGD meibomian gland dysfunction; ATs artificial tears; PFAT's preservative free artificial tears; [place] nuclear sclerotic cataract; PSC posterior subcapsular cataract; ERM epi-retinal membrane; PVD posterior vitreous detachment; RD retinal detachment; DM diabetes mellitus; DR diabetic retinopathy; NPDR non-proliferative diabetic retinopathy; PDR proliferative diabetic retinopathy; CSME clinically significant macular edema; DME diabetic macular edema; dbh dot blot hemorrhages; CWS cotton wool spot; POAG primary open angle glaucoma; C/D cup-to-disc ratio; HVF humphrey visual field; GVF goldmann visual field; OCT optical coherence tomography; IOP intraocular pressure; BRVO Branch retinal vein  occlusion; CRVO central retinal vein occlusion; CRAO central retinal artery occlusion; BRAO branch retinal artery occlusion; RT retinal tear; SB scleral buckle; PPV pars plana vitrectomy; VH Vitreous hemorrhage; PRP panretinal laser photocoagulation; IVK intravitreal kenalog; VMT vitreomacular traction; MH Macular hole;  NVD neovascularization of the disc; NVE neovascularization elsewhere; AREDS age related eye disease study; ARMD age related macular degeneration; POAG primary open angle glaucoma; EBMD epithelial/anterior basement membrane dystrophy; ACIOL anterior chamber intraocular lens; IOL intraocular lens; PCIOL posterior chamber intraocular lens; Phaco/IOL phacoemulsification with intraocular lens placement; [place] photorefractive keratectomy; LASIK laser assisted in situ keratomileusis; HTN hypertension; DM diabetes mellitus; COPD chronic obstructive pulmonary disease

## 2019-05-22 ENCOUNTER — Encounter (INDEPENDENT_AMBULATORY_CARE_PROVIDER_SITE_OTHER): Payer: Self-pay | Admitting: Ophthalmology

## 2019-05-22 ENCOUNTER — Ambulatory Visit (INDEPENDENT_AMBULATORY_CARE_PROVIDER_SITE_OTHER): Payer: Medicare Other | Admitting: Ophthalmology

## 2019-05-22 ENCOUNTER — Other Ambulatory Visit: Payer: Self-pay

## 2019-05-22 DIAGNOSIS — H3581 Retinal edema: Secondary | ICD-10-CM | POA: Diagnosis not present

## 2019-05-22 DIAGNOSIS — I10 Essential (primary) hypertension: Secondary | ICD-10-CM

## 2019-05-22 DIAGNOSIS — H35033 Hypertensive retinopathy, bilateral: Secondary | ICD-10-CM

## 2019-05-22 DIAGNOSIS — H353221 Exudative age-related macular degeneration, left eye, with active choroidal neovascularization: Secondary | ICD-10-CM

## 2019-05-22 DIAGNOSIS — H353213 Exudative age-related macular degeneration, right eye, with inactive scar: Secondary | ICD-10-CM

## 2019-05-22 DIAGNOSIS — Z961 Presence of intraocular lens: Secondary | ICD-10-CM

## 2019-05-22 MED ORDER — AFLIBERCEPT 2MG/0.05ML IZ SOLN FOR KALEIDOSCOPE
2.0000 mg | INTRAVITREAL | Status: AC | PRN
  Administered 2019-05-22: 17:00:00 2 mg via INTRAVITREAL

## 2019-06-09 ENCOUNTER — Other Ambulatory Visit: Payer: Self-pay

## 2019-06-09 ENCOUNTER — Encounter: Payer: Self-pay | Admitting: Podiatry

## 2019-06-09 ENCOUNTER — Ambulatory Visit (INDEPENDENT_AMBULATORY_CARE_PROVIDER_SITE_OTHER): Payer: Medicare Other | Admitting: Podiatry

## 2019-06-09 DIAGNOSIS — I739 Peripheral vascular disease, unspecified: Secondary | ICD-10-CM

## 2019-06-09 DIAGNOSIS — M79675 Pain in left toe(s): Secondary | ICD-10-CM | POA: Diagnosis not present

## 2019-06-09 DIAGNOSIS — L84 Corns and callosities: Secondary | ICD-10-CM

## 2019-06-09 DIAGNOSIS — B351 Tinea unguium: Secondary | ICD-10-CM | POA: Diagnosis not present

## 2019-06-09 DIAGNOSIS — M79674 Pain in right toe(s): Secondary | ICD-10-CM | POA: Diagnosis not present

## 2019-06-09 NOTE — Patient Instructions (Signed)

## 2019-06-09 NOTE — Progress Notes (Signed)
Subjective: Steven Williams is seen today with h/o PAD for follow up painful, elongated, thickened toenails 1-5 b/l feet that he cannot cut. Pain interferes with daily activities. Aggravating factor includes wearing enclosed shoe gear and relieved with periodic debridement.  Current Outpatient Medications on File Prior to Visit  Medication Sig  . allopurinol (ZYLOPRIM) 100 MG tablet TK 1 T PO D  . allopurinol (ZYLOPRIM) 300 MG tablet Take 300 mg by mouth daily.  . carvedilol (COREG) 25 MG tablet Take 25 mg by mouth 2 (two) times daily with a meal.  . carvedilol (COREG) 6.25 MG tablet Take 6.25 mg by mouth 2 (two) times daily.  . Certolizumab Pegol (CIMZIA [place]) Inject into the skin every 30 (thirty) days. Patient is unsure of dose  . Cholecalciferol (VITAMIN D3) 50 MCG (2000 UT) TABS Take 1 tablet by mouth 2 (two) times daily.  Marland Kitchen dicyclomine (BENTYL) 10 MG capsule Take 10 mg by mouth 4 (four) times daily.  . digoxin (LANOXIN) 0.125 MG tablet Take 0.125 mg by mouth as directed. Patient takes medication on M/W/F  . Ferrous Sulfate (IRON) 325 (65 Fe) MG TABS Take 1 tablet by mouth 2 (two) times daily.  . furosemide (LASIX) 40 MG tablet Take 40 mg by mouth daily.  Marland Kitchen gabapentin (NEURONTIN) 400 MG capsule Take 400 mg by mouth at bedtime.  Marland Kitchen levothyroxine (SYNTHROID, LEVOTHROID) 88 MCG tablet Take 88 mcg by mouth daily before breakfast.  . Misc Natural Products (GLUCOSAMINE CHOND COMPLEX/MSM PO) Take 1 tablet by mouth daily.  . Multiple Vitamins-Minerals (PRESERVISION AREDS PO) Take 1 tablet by mouth 2 (two) times daily.  . NON FORMULARY Antifungal nail cream from [place] - faxed 03/27/2019  [facility]-CMA  . predniSONE (DELTASONE) 5 MG tablet Take 1 tablet (5 mg total) by mouth daily with breakfast. Take 5 tabs at 2:30 am on 04/07/19, 5 tabs at 8:30 am on 04/07/19 and 5 tabs at 2:30 pm on 04/07/19  . Ranibizumab (LUCENTIS IZ) by Intravitreal route every 30 (thirty) days. Patient is unsure of dose  .  rOPINIRole (REQUIP) 0.5 MG tablet   . rosuvastatin (CRESTOR) 10 MG tablet Take 10 mg by mouth daily.  . saw palmetto 500 MG capsule Take 500 mg by mouth 2 (two) times daily.  . tamsulosin (FLOMAX) 0.4 MG CAPS capsule Take 0.4 mg by mouth daily.  . temazepam (RESTORIL) 15 MG capsule Take 15 mg by mouth at bedtime as needed for sleep.  . traMADol (ULTRAM) 50 MG tablet TK 1 T PO   UP TO TID PRF SEVERE PAIN  . Turmeric 500 MG CAPS Take 1 capsule by mouth daily.  Marland Kitchen warfarin (COUMADIN) 2 MG tablet Take 2 mg by mouth daily.   Current Facility-Administered Medications on File Prior to Visit  Medication  . Bevacizumab (AVASTIN) SOLN 1.25 mg     Allergies  Allergen Reactions  . Iodinated Diagnostic Agents Rash     Objective:  Vascular Examination: Capillary refill time <4 seconds x 10 digits.  Dorsalis pedis and Posterior tibial pulses nonpalpable b/l.  Digital hair absent b/l.  Skin temperature gradient warm to cool b/l.  Dermatological Examination: Skin thin, shiny and atrophic b/l. Skin dry bilaterally. No cracks or signs of infection.  No open wounds b/l.  Toenails 1-5 b/l discolored, thick, dystrophic with subungual debris and pain with palpation to nailbeds due to thickness of nails.  Hyperkeratotic lesion submet head 1 right foot. No edema, no erythema, no drainage, no flocculence.  Musculoskeletal: Muscle strength  5/5 to all LE muscle groups.  No gross bony deformities b/l.  No pain, crepitus or joint limitation noted with ROM.   Neurological Examination: Protective sensation 5/5 b/l intact with 10 gram monofilament.  Assessment: Painful onychomycosis toenails 1-5 b/l  Callus submet head 1 right foot PAD  Plan: 1. Toenails 1-5 b/l were debrided in length and girth without iatrogenic bleeding. 2. Calluses pared submetatarsal head(s) 1 right foot utilizing sterile scalpel blade without incident. 3. Patient to continue soft, supportive shoe gear daily.  4. Patient  to report any pedal injuries to medical professional immediately. 5. Follow up 3 months. 6. Patient/POA to call should there be a concern in the interim.

## 2019-06-26 ENCOUNTER — Encounter (INDEPENDENT_AMBULATORY_CARE_PROVIDER_SITE_OTHER): Payer: Medicare Other | Admitting: Ophthalmology

## 2019-06-26 ENCOUNTER — Ambulatory Visit (INDEPENDENT_AMBULATORY_CARE_PROVIDER_SITE_OTHER): Payer: Medicare Other | Admitting: *Deleted

## 2019-06-26 DIAGNOSIS — R001 Bradycardia, unspecified: Secondary | ICD-10-CM

## 2019-06-26 DIAGNOSIS — I4821 Permanent atrial fibrillation: Secondary | ICD-10-CM

## 2019-06-26 LAB — CUP PACEART REMOTE DEVICE CHECK
Battery Impedance: 1748 Ohm
Battery Remaining Longevity: 43 mo
Battery Voltage: 2.76 V
Brady Statistic RV Percent Paced: 6 %
Date Time Interrogation Session: 20201019160031
Implantable Lead Implant Date: 20041008
Implantable Lead Implant Date: 20041008
Implantable Lead Location: 753859
Implantable Lead Location: 753860
Implantable Lead Model: 5076
Implantable Lead Model: 5076
Implantable Pulse Generator Implant Date: 20130328
Lead Channel Impedance Value: 67 Ohm
Lead Channel Impedance Value: 798 Ohm
Lead Channel Setting Pacing Amplitude: 2.5 V
Lead Channel Setting Pacing Pulse Width: 1 ms
Lead Channel Setting Sensing Sensitivity: 4 mV

## 2019-06-30 ENCOUNTER — Encounter (INDEPENDENT_AMBULATORY_CARE_PROVIDER_SITE_OTHER): Payer: Medicare Other | Admitting: Ophthalmology

## 2019-06-30 ENCOUNTER — Other Ambulatory Visit: Payer: Self-pay

## 2019-06-30 ENCOUNTER — Encounter (INDEPENDENT_AMBULATORY_CARE_PROVIDER_SITE_OTHER): Payer: Self-pay | Admitting: Ophthalmology

## 2019-06-30 ENCOUNTER — Ambulatory Visit (INDEPENDENT_AMBULATORY_CARE_PROVIDER_SITE_OTHER): Payer: Medicare Other | Admitting: Ophthalmology

## 2019-06-30 DIAGNOSIS — H353213 Exudative age-related macular degeneration, right eye, with inactive scar: Secondary | ICD-10-CM

## 2019-06-30 DIAGNOSIS — I10 Essential (primary) hypertension: Secondary | ICD-10-CM | POA: Diagnosis not present

## 2019-06-30 DIAGNOSIS — H3581 Retinal edema: Secondary | ICD-10-CM | POA: Diagnosis not present

## 2019-06-30 DIAGNOSIS — H353221 Exudative age-related macular degeneration, left eye, with active choroidal neovascularization: Secondary | ICD-10-CM

## 2019-06-30 DIAGNOSIS — Z961 Presence of intraocular lens: Secondary | ICD-10-CM

## 2019-06-30 DIAGNOSIS — H35033 Hypertensive retinopathy, bilateral: Secondary | ICD-10-CM

## 2019-06-30 MED ORDER — AFLIBERCEPT 2MG/0.05ML IZ SOLN FOR KALEIDOSCOPE
2.0000 mg | INTRAVITREAL | Status: AC | PRN
  Administered 2019-06-30: 14:00:00 2 mg via INTRAVITREAL

## 2019-06-30 NOTE — Progress Notes (Addendum)
[facility] Note  06/30/2019     CHIEF COMPLAINT Patient presents for Retina Follow Up   HISTORY OF PRESENT ILLNESS: Steven Williams is a 85 y.o. male who presents to the clinic today for:   HPI    Retina Follow Up    Patient presents with  Dry AMD.  In left eye.  This started 6 months ago.  Since onset it is stable.  I, the attending physician,  performed the HPI with the patient and updated documentation appropriately.          Comments    F/U EXU AMD OS. Patient states his vision "has not changed", denies new visual onsets/issues.        Last edited by [name], MD on 06/30/2019  2:06 PM. (History)    pt states he is doing well, but he is having a hard time reading  Referring physician:   HISTORICAL INFORMATION:   Selected notes from the MEDICAL RECORD NUMBER Referred by [name], [place] LEE:  Ocular Hx- PMH-    CURRENT MEDICATIONS: Current Outpatient Medications (Ophthalmic Drugs)  Medication Sig  . Ranibizumab (LUCENTIS IZ) by Intravitreal route every 30 (thirty) days. Patient is unsure of dose   No current facility-administered medications for this visit.  (Ophthalmic Drugs)   Current Outpatient Medications (Other)  Medication Sig  . allopurinol (ZYLOPRIM) 100 MG tablet TK 1 T PO D  . allopurinol (ZYLOPRIM) 300 MG tablet Take 300 mg by mouth daily.  . carvedilol (COREG) 25 MG tablet Take 25 mg by mouth 2 (two) times daily with a meal.  . carvedilol (COREG) 6.25 MG tablet Take 6.25 mg by mouth 2 (two) times daily.  . Certolizumab Pegol (CIMZIA [place]) Inject into the skin every 30 (thirty) days. Patient is unsure of dose  . Cholecalciferol (VITAMIN D3) 50 MCG (2000 UT) TABS Take 1 tablet by mouth 2 (two) times daily.  Marland Kitchen dicyclomine (BENTYL) 10 MG capsule Take 10 mg by mouth 4 (four) times daily.  . digoxin (LANOXIN) 0.125 MG tablet Take 0.125 mg by mouth as directed. Patient takes medication on M/W/F  . Ferrous  Sulfate (IRON) 325 (65 Fe) MG TABS Take 1 tablet by mouth 2 (two) times daily.  . furosemide (LASIX) 40 MG tablet Take 40 mg by mouth daily.  Marland Kitchen gabapentin (NEURONTIN) 400 MG capsule Take 400 mg by mouth at bedtime.  Marland Kitchen levothyroxine (SYNTHROID, LEVOTHROID) 88 MCG tablet Take 88 mcg by mouth daily before breakfast.  . Misc Natural Products (GLUCOSAMINE CHOND COMPLEX/MSM PO) Take 1 tablet by mouth daily.  . Multiple Vitamins-Minerals (PRESERVISION AREDS PO) Take 1 tablet by mouth 2 (two) times daily.  . NON FORMULARY Antifungal nail cream from [facility] - faxed 03/27/2019  [facility]  . predniSONE (DELTASONE) 5 MG tablet Take 1 tablet (5 mg total) by mouth daily with breakfast. Take 5 tabs at 2:30 am on 04/07/19, 5 tabs at 8:30 am on 04/07/19 and 5 tabs at 2:30 pm on 04/07/19  . rOPINIRole (REQUIP) 0.5 MG tablet   . rosuvastatin (CRESTOR) 10 MG tablet Take 10 mg by mouth daily.  . saw palmetto 500 MG capsule Take 500 mg by mouth 2 (two) times daily.  . tamsulosin (FLOMAX) 0.4 MG CAPS capsule Take 0.4 mg by mouth daily.  . temazepam (RESTORIL) 15 MG capsule Take 15 mg by mouth at bedtime as needed for sleep.  . traMADol (ULTRAM) 50 MG tablet TK 1 T PO  UP TO TID PRF SEVERE PAIN  . Turmeric 500 MG CAPS Take 1 capsule by mouth daily.  Marland Kitchen warfarin (COUMADIN) 2 MG tablet Take 2 mg by mouth daily.   Current Facility-Administered Medications (Other)  Medication Route  . Bevacizumab (AVASTIN) SOLN 1.25 mg Intravitreal      REVIEW OF SYSTEMS: ROS    Positive for: Eyes   Negative for: Constitutional, Gastrointestinal, Neurological, Skin, Genitourinary, Musculoskeletal, HENT, Endocrine, Cardiovascular, Respiratory, Psychiatric, Allergic/Imm, Heme/Lymph   Last edited by Zenovia Jordan, LPN on 42/59/5638  7:56 PM. (History)       ALLERGIES Allergies  Allergen Reactions  . Iodinated Diagnostic Agents Rash    PAST MEDICAL HISTORY Past Medical History:  Diagnosis Date  . Antiphospholipid  antibody positive   . Aortic aneurysm ([place])   . Aortic dissection ([place])   . Central retinal artery occlusion   . Chronic anemia   . Chronic renal insufficiency   . COPD (chronic obstructive pulmonary disease) ([place])   . DDD (degenerative disc disease), cervical   . DJD (degenerative joint disease)   . DVT (deep venous thrombosis) ([place])   . Gastric dysmotility   . GERD (gastroesophageal reflux disease)   . Gout   . HTN (hypertension)   . Hyperlipidemia   . Myelodysplasia (myelodysplastic syndrome) ([place])   . Paget's disease of bone   . Permanent atrial fibrillation ([place])   . Rheumatoid arthritis ([place])   . Symptomatic bradycardia   . TIA (transient ischemic attack)    Past Surgical History:  Procedure Laterality Date  . ATRIAL FIBRILLATION ABLATION  09/23/2014   Dr Encarnacion Chu in [place]  . BACK SURGERY    . CAROTID ENDARTERECTOMY    . CAROTID STENT    . CATARACT EXTRACTION Bilateral 2010   [place]  . EYE SURGERY    . LUNG DECORTICATION     [facility]  . PACEMAKER GENERATOR CHANGE  12/03/2011   MDT [facility] [id number] generator change by Dr Encarnacion Chu for symptomatic bradycardia  . PACEMAKER IMPLANT  2004   MDT     FAMILY HISTORY History reviewed. No pertinent family history.  SOCIAL HISTORY Social History   Tobacco Use  . Smoking status: Former Smoker    Types: Cigarettes    Quit date: 1961    Years since quitting: 59.8  . Smokeless tobacco: Never Used  Substance Use Topics  . Alcohol use: Yes    Alcohol/week: 2.0 standard drinks    Types: 2 Glasses of wine per week    Comment: daily  . Drug use: Never         OPHTHALMIC EXAM:  Base Eye Exam    Visual Acuity (Snellen - Linear)      Right Left   Dist cc CF at 3' 20/40 +2   Dist ph cc  NI   Correction: Glasses       Tonometry (Tonopen, 1:04 PM)      Right Left   Pressure 14 12       Pupils      Dark Light Shape React APD   Right 3 2 Round Slow None   Left 3 2 Round Brisk None       Visual Fields (Counting  fingers)      Left Right    Full    Restrictions  Partial outer inferior temporal, inferior nasal deficiencies       Extraocular Movement      Right Left    Full, Ortho Full, Ortho  Neuro/Psych    Oriented x3: Yes   Mood/Affect: Normal       Dilation    Both eyes: 1.0% Mydriacyl, 2.5% Phenylephrine @ 1:00 PM        Slit Lamp and Fundus Exam    Slit Lamp Exam      Right Left   Lids/Lashes Dermatochalasis - upper lid, Meibomian gland dysfunction Dermatochalasis - upper lid, Meibomian gland dysfunction   Conjunctiva/Sclera White and quiet White and quiet   Cornea Arcus, 2-3+ Punctate epithelial erosions, Well healed cataract wounds Arcus, 3-4+ Punctate epithelial erosions centrally, Well healed cataract wounds   Anterior Chamber Deep and quiet Deep and quiet   Iris Round and poorly dilated Round and poorly dilated to 4.5   Lens Posterior chamber intraocular lens, trace Posterior capsular opacification Posterior chamber intraocular lens, trace Posterior capsular opacification   Vitreous Vitreous syneresis, Posterior vitreous detachment Vitreous syneresis       Fundus Exam      Right Left   Disc +cupping, trace pallor, sharp rim, 360 PPA 360 PPA, +cupping   C/D Ratio 0.7 0.7   Macula Flat, diffuse RPE atrophy, central pigment clumping, No heme or edema Flat, Blunted foveal reflex, Drusen, RPE mottling, clumping and atrophy, No edema, ?punctate heme   Vessels Vascular attenuation Vascular attenuation, Tortuous   Periphery Attached, mild reticular degeneration, mild paving stone inferiorly Attached, mild reticular degeneration          IMAGING AND PROCEDURES  Imaging and Procedures for @TODAY @  OCT, Retina - OU - Both Eyes       Right Eye Quality was good. Central Foveal Thickness: 260. Progression has been stable. Findings include abnormal foveal contour, no IRF, no SRF, subretinal hyper-reflective material, retinal drusen , outer retinal atrophy, pigment  epithelial detachment.   Left Eye Quality was good. Central Foveal Thickness: 259. Progression has been stable. Findings include abnormal foveal contour, no SRF, outer retinal atrophy, retinal drusen , pigment epithelial detachment, subretinal hyper-reflective material, intraretinal fluid (Mild interval improvement in IRF).   Notes *Images captured and stored on drive  Diagnosis / Impression:  Exudative ARMD OU OD: +atrophic macular scar OS: Mild interval improvement in IRF/cystic changes  Clinical management:  See below  Abbreviations: NFP - Normal foveal profile. CME - cystoid macular edema. PED - pigment epithelial detachment. IRF - intraretinal fluid. SRF - subretinal fluid. EZ - ellipsoid zone. ERM - epiretinal membrane. ORA - outer retinal atrophy. ORT - outer retinal tubulation. SRHM - subretinal hyper-reflective material        Intravitreal Injection, Pharmacologic Agent - OS - Left Eye       Time Out 06/30/2019. 1:19 PM. Confirmed correct patient, procedure, site, and patient consented.   Anesthesia Topical anesthesia was used. Anesthetic medications included Lidocaine 2%, Proparacaine 0.5%.   Procedure Preparation included 5% betadine to ocular surface, eyelid speculum. A 30 gauge needle was used.   Injection:  2 mg aflibercept Alfonse Flavors) SOLN   NDC: A3590391, Lot: [id number], Expiration date: 12/26/2019   Route: Intravitreal, Site: Left Eye, Waste: 0 mL  Post-op Post injection exam found visual acuity of at least counting fingers. The patient tolerated the procedure well. There were no complications. The patient received written and verbal post procedure care education.                 ASSESSMENT/PLAN:    ICD-10-CM   1. Exudative age-related macular degeneration of left eye with active choroidal neovascularization (HCC)  H35.3221 Intravitreal Injection, Pharmacologic Agent -  OS - Left Eye    aflibercept (EYLEA) SOLN 2 mg  2. Exudative age-related  macular degeneration of right eye with inactive scar ([place])  H35.3213   3. Retinal edema  H35.81 OCT, Retina - OU - Both Eyes  4. Essential hypertension  I10   5. Hypertensive retinopathy of both eyes  H35.033   6. Pseudophakia of both eyes  Z96.1     1-3. 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    OD- w/ inactive scar -- history of low vision, CF 3'  OS- w/ CNV   - history of multiple IVL OS in [place], [place] w/ Dr. Doristine Church -- last injection 06/2018, q4-6 wk interval  - moved to [place] in November 2019 and has been followed by Dr. Baird Cancer who had recommended observation, thus pt sought second opinion  - given functional monocular status and history of active CNV, has been receiving maintenance injections, s/p IVA #1 OS (02.25.20), #2 (04.07.20), #3 (05.05.20), #4 (06.22.20), #5 (08.03.20)  - s/p IVE OS #1 (09.14.20)  - OCT shows PEDs, CNV w/ tr SRF OS -- stably improved; OD with inactive scar  - BCVA back to baseline at 20/40  - discussed treatment options, pt wishes to try switch in medication to [facility]  - recommend IVE #2 OS today, 10.23.20, w/ f/u in 5 wks -- pt prefers aggressive maintenance given functional monocular stutus  - pt wishes to be treated with IVE OS  - RBA of procedure discussed, questions answered  - informed consent obtained and signed  - see procedure note  - Eylea4U benefits investigation started 9.14.20 -- approved as of 10.23.20  - f/u in 5 wks -- DFE, OCT  4,5. Hypertensive retinopathy OU  - discussed importance of tight BP control  - monitor  6. Pseudophakia OU  - s/p CE/IOL OU  - monitor   Ophthalmic Meds Ordered this visit:  Meds ordered this encounter  Medications  . aflibercept (EYLEA) SOLN 2 mg       Return in about 5 weeks (around 08/04/2019) for f/u exu ARMD OU, DFE, OCT.  There are no Patient Instructions on file for this visit.   Explained the diagnoses, plan, and follow up with the patient and they expressed  understanding.  Patient expressed understanding of the importance of proper follow up care.   This document serves as a record of services personally performed by Gardiner Sleeper, MD, PhD. It was created on their behalf by Ernest Mallick, OA, an ophthalmic assistant. The creation of this record is the provider's dictation and/or activities during the visit.    Electronically signed by: Ernest Mallick, OA 10.23.2020 12:48 AM   Gardiner Sleeper, M.D., Ph.D. Diseases & Surgery of the Retina and Vitreous [facility]  I have reviewed the above documentation for accuracy and completeness, and I agree with the above. Gardiner Sleeper, M.D., Ph.D. 07/02/19 12:48 AM   Abbreviations: M myopia (nearsighted); A astigmatism; H hyperopia (farsighted); P presbyopia; Mrx spectacle prescription;  CTL contact lenses; OD right eye; OS left eye; OU both eyes  XT exotropia; ET esotropia; PEK punctate epithelial keratitis; PEE punctate epithelial erosions; DES dry eye syndrome; MGD meibomian gland dysfunction; ATs artificial tears; PFAT's preservative free artificial tears; [place] nuclear sclerotic cataract; PSC posterior subcapsular cataract; ERM epi-retinal membrane; PVD posterior vitreous detachment; RD retinal detachment; DM diabetes mellitus; DR diabetic retinopathy; NPDR non-proliferative diabetic retinopathy; PDR proliferative diabetic retinopathy; CSME clinically significant macular edema; DME diabetic  macular edema; dbh dot blot hemorrhages; CWS cotton wool spot; POAG primary open angle glaucoma; C/D cup-to-disc ratio; HVF humphrey visual field; GVF goldmann visual field; OCT optical coherence tomography; IOP intraocular pressure; BRVO Branch retinal vein occlusion; CRVO central retinal vein occlusion; CRAO central retinal artery occlusion; BRAO branch retinal artery occlusion; RT retinal tear; SB scleral buckle; PPV pars plana vitrectomy; VH Vitreous hemorrhage; PRP panretinal laser  photocoagulation; IVK intravitreal kenalog; VMT vitreomacular traction; MH Macular hole;  NVD neovascularization of the disc; NVE neovascularization elsewhere; AREDS age related eye disease study; ARMD age related macular degeneration; POAG primary open angle glaucoma; EBMD epithelial/anterior basement membrane dystrophy; ACIOL anterior chamber intraocular lens; IOL intraocular lens; PCIOL posterior chamber intraocular lens; Phaco/IOL phacoemulsification with intraocular lens placement; [place] photorefractive keratectomy; LASIK laser assisted in situ keratomileusis; HTN hypertension; DM diabetes mellitus; COPD chronic obstructive pulmonary disease

## 2019-07-06 DIAGNOSIS — R1310 Dysphagia, unspecified: Secondary | ICD-10-CM | POA: Insufficient documentation

## 2019-07-14 NOTE — Progress Notes (Signed)
Remote pacemaker transmission.   

## 2019-07-17 ENCOUNTER — Encounter: Payer: Self-pay | Admitting: Gastroenterology

## 2019-07-17 ENCOUNTER — Other Ambulatory Visit: Payer: Self-pay | Admitting: Internal Medicine

## 2019-07-17 DIAGNOSIS — R1310 Dysphagia, unspecified: Secondary | ICD-10-CM

## 2019-07-25 ENCOUNTER — Ambulatory Visit
Admission: RE | Admit: 2019-07-25 | Discharge: 2019-07-25 | Disposition: A | Discharge status: Home / Self Care | Payer: Medicare Other | Source: Ambulatory Visit | Attending: Internal Medicine | Admitting: Internal Medicine

## 2019-07-25 DIAGNOSIS — R1310 Dysphagia, unspecified: Secondary | ICD-10-CM

## 2019-07-26 NOTE — Progress Notes (Signed)
[facility] Note  08/07/2019     CHIEF COMPLAINT Patient presents for Retina Follow Up   HISTORY OF PRESENT ILLNESS: Steven Williams is a 85 y.o. male who presents to the clinic today for:   HPI    Retina Follow Up    Patient presents with  Dry AMD.  In both eyes.  This started weeks ago.  Severity is moderate.  Duration of weeks.  Since onset it is stable.  I, the attending physician,  performed the HPI with the patient and updated documentation appropriately.          Comments    Pt states his vision seems to be getting worse but is really unsure.  Patient denies eye pain or discomfort and denies any new or worsening floaters or fol OU.       Last edited by [name], MD on 08/07/2019  6:04 PM. (History)    pt states is vision is stable  Referring physician:   HISTORICAL INFORMATION:   Selected notes from the MEDICAL RECORD NUMBER Referred by [name], [place] LEE:  Ocular Hx- PMH-    CURRENT MEDICATIONS: Current Outpatient Medications (Ophthalmic Drugs)  Medication Sig  . Ranibizumab (LUCENTIS IZ) by Intravitreal route every 30 (thirty) days. Patient is unsure of dose   No current facility-administered medications for this visit.  (Ophthalmic Drugs)   Current Outpatient Medications (Other)  Medication Sig  . allopurinol (ZYLOPRIM) 100 MG tablet TK 1 T PO D  . allopurinol (ZYLOPRIM) 300 MG tablet Take 300 mg by mouth daily.  . carvedilol (COREG) 25 MG tablet Take 25 mg by mouth 2 (two) times daily with a meal.  . carvedilol (COREG) 6.25 MG tablet Take 6.25 mg by mouth 2 (two) times daily.  . Certolizumab Pegol (CIMZIA [place]) Inject into the skin every 30 (thirty) days. Patient is unsure of dose  . Cholecalciferol (VITAMIN D3) 50 MCG (2000 UT) TABS Take 1 tablet by mouth 2 (two) times daily.  Marland Kitchen dicyclomine (BENTYL) 10 MG capsule Take 10 mg by mouth 4 (four) times daily.  . digoxin (LANOXIN) 0.125 MG tablet Take 0.125 mg by  mouth as directed. Patient takes medication on M/W/F  . Ferrous Sulfate (IRON) 325 (65 Fe) MG TABS Take 1 tablet by mouth 2 (two) times daily.  . furosemide (LASIX) 40 MG tablet Take 40 mg by mouth daily.  Marland Kitchen gabapentin (NEURONTIN) 400 MG capsule Take 400 mg by mouth at bedtime.  Marland Kitchen levothyroxine (SYNTHROID, LEVOTHROID) 88 MCG tablet Take 88 mcg by mouth daily before breakfast.  . Misc Natural Products (GLUCOSAMINE CHOND COMPLEX/MSM PO) Take 1 tablet by mouth daily.  . Multiple Vitamins-Minerals (PRESERVISION AREDS PO) Take 1 tablet by mouth 2 (two) times daily.  . NON FORMULARY Antifungal nail cream from [facility] - faxed 03/27/2019  [facility]  . predniSONE (DELTASONE) 5 MG tablet Take 1 tablet (5 mg total) by mouth daily with breakfast. Take 5 tabs at 2:30 am on 04/07/19, 5 tabs at 8:30 am on 04/07/19 and 5 tabs at 2:30 pm on 04/07/19  . rOPINIRole (REQUIP) 0.5 MG tablet   . rosuvastatin (CRESTOR) 10 MG tablet Take 10 mg by mouth daily.  . saw palmetto 500 MG capsule Take 500 mg by mouth 2 (two) times daily.  . tamsulosin (FLOMAX) 0.4 MG CAPS capsule Take 0.4 mg by mouth daily.  . temazepam (RESTORIL) 15 MG capsule Take 15 mg by mouth at bedtime as needed for sleep.  Marland Kitchen  traMADol (ULTRAM) 50 MG tablet TK 1 T PO   UP TO TID PRF SEVERE PAIN  . Turmeric 500 MG CAPS Take 1 capsule by mouth daily.  Marland Kitchen warfarin (COUMADIN) 2 MG tablet Take 2 mg by mouth daily.   Current Facility-Administered Medications (Other)  Medication Route  . Bevacizumab (AVASTIN) SOLN 1.25 mg Intravitreal      REVIEW OF SYSTEMS: ROS    Positive for: Eyes   Negative for: Constitutional, Gastrointestinal, Neurological, Skin, Genitourinary, Musculoskeletal, HENT, Endocrine, Cardiovascular, Respiratory, Psychiatric, Allergic/Imm, Heme/Lymph   Last edited by Doneen Poisson on 08/07/2019  1:13 PM. (History)       ALLERGIES Allergies  Allergen Reactions  . Iodinated Diagnostic Agents Rash    PAST MEDICAL  HISTORY Past Medical History:  Diagnosis Date  . Antiphospholipid antibody positive   . Aortic aneurysm ([place])   . Aortic dissection ([place])   . Central retinal artery occlusion   . Chronic anemia   . Chronic renal insufficiency   . COPD (chronic obstructive pulmonary disease) ([place])   . DDD (degenerative disc disease), cervical   . DJD (degenerative joint disease)   . DVT (deep venous thrombosis) ([place])   . Gastric dysmotility   . GERD (gastroesophageal reflux disease)   . Gout   . HTN (hypertension)   . Hyperlipidemia   . Myelodysplasia (myelodysplastic syndrome) ([place])   . Paget's disease of bone   . Permanent atrial fibrillation ([place])   . Rheumatoid arthritis ([place])   . Symptomatic bradycardia   . TIA (transient ischemic attack)    Past Surgical History:  Procedure Laterality Date  . ATRIAL FIBRILLATION ABLATION  09/23/2014   Dr Encarnacion Chu in [place]  . BACK SURGERY    . CAROTID ENDARTERECTOMY    . CAROTID STENT    . CATARACT EXTRACTION Bilateral 2010   [place]  . EYE SURGERY    . LUNG DECORTICATION     VATS  . PACEMAKER GENERATOR CHANGE  12/03/2011   MDT Adapta [id number] generator change by Dr Encarnacion Chu for symptomatic bradycardia  . PACEMAKER IMPLANT  2004   MDT     FAMILY HISTORY History reviewed. No pertinent family history.  SOCIAL HISTORY Social History   Tobacco Use  . Smoking status: Former Smoker    Types: Cigarettes    Quit date: 1961    Years since quitting: 59.9  . Smokeless tobacco: Never Used  Substance Use Topics  . Alcohol use: Yes    Alcohol/week: 2.0 standard drinks    Types: 2 Glasses of wine per week    Comment: daily  . Drug use: Never         OPHTHALMIC EXAM:  Base Eye Exam    Visual Acuity (Snellen - Linear)      Right Left   Dist [place] CF @ 5' 20/40 +2   Dist ph [place] NI NI   Correction: Glasses       Tonometry (Tonopen, 1:19 PM)      Right Left   Pressure 15 15       Pupils      Dark Light Shape React APD   Right 3 2 Round  Minimal 0   Left 3 2 Round Minimal 0       Visual Fields      Left Right    Full    Restrictions  Partial outer inferior temporal, inferior nasal deficiencies       Extraocular Movement      Right Left  Full Full       Neuro/Psych    Oriented x3: Yes   Mood/Affect: Normal       Dilation    Both eyes: 1.0% Mydriacyl, 2.5% Phenylephrine @ 1:19 PM        Slit Lamp and Fundus Exam    Slit Lamp Exam      Right Left   Lids/Lashes Dermatochalasis - upper lid, Meibomian gland dysfunction Dermatochalasis - upper lid, Meibomian gland dysfunction   Conjunctiva/Sclera White and quiet White and quiet   Cornea Arcus, 2-3+ Punctate epithelial erosions, Well healed cataract wounds Arcus, 3-4+ Punctate epithelial erosions centrally, Well healed cataract wounds   Anterior Chamber Deep and quiet Deep and quiet   Iris Round and poorly dilated Round and poorly dilated to 4.5   Lens Posterior chamber intraocular lens, trace Posterior capsular opacification Posterior chamber intraocular lens, trace Posterior capsular opacification   Vitreous Vitreous syneresis, Posterior vitreous detachment Vitreous syneresis       Fundus Exam      Right Left   Disc +cupping, trace pallor, sharp rim, 360 PPA 360 PPA, +cupping   C/D Ratio 0.7 0.7   Macula Flat, diffuse RPE atrophy, central pigment clumping, No heme or edema Flat, Blunted foveal reflex, Drusen, RPE mottling, clumping and atrophy, No edema/heme   Vessels Vascular attenuation Vascular attenuation, Tortuous   Periphery Attached, mild reticular degeneration, mild paving stone inferiorly Attached, mild reticular degeneration        Refraction    Wearing Rx      Sphere Cylinder Axis Add   Right -1.75 +2.25 005 +2.50   Left -1.50 +2.50 175 +2.50   Type: PAL          IMAGING AND PROCEDURES  Imaging and Procedures for @TODAY @  OCT, Retina - OU - Both Eyes       Right Eye Quality was good. Central Foveal Thickness: 294. Progression  has been stable. Findings include abnormal foveal contour, no IRF, no SRF, subretinal hyper-reflective material, retinal drusen , outer retinal atrophy, pigment epithelial detachment.   Left Eye Quality was good. Central Foveal Thickness: 262. Progression has been stable. Findings include abnormal foveal contour, no SRF, outer retinal atrophy, retinal drusen , pigment epithelial detachment, subretinal hyper-reflective material, no IRF (Mild interval improvement in IRF -- essentially resolved).   Notes *Images captured and stored on drive  Diagnosis / Impression:  Exudative ARMD OU OD: +atrophic macular scar OS: Mild interval improvement in IRF/cystic changes -- essentially resolved  Clinical management:  See below  Abbreviations: NFP - Normal foveal profile. CME - cystoid macular edema. PED - pigment epithelial detachment. IRF - intraretinal fluid. SRF - subretinal fluid. EZ - ellipsoid zone. ERM - epiretinal membrane. ORA - outer retinal atrophy. ORT - outer retinal tubulation. SRHM - subretinal hyper-reflective material        Intravitreal Injection, Pharmacologic Agent - OS - Left Eye       Time Out 08/07/2019. 1:13 PM. Confirmed correct patient, procedure, site, and patient consented.   Anesthesia Topical anesthesia was used. Anesthetic medications included Lidocaine 2%, Proparacaine 0.5%.   Procedure Preparation included 5% betadine to ocular surface, eyelid speculum. A 30 gauge needle was used.   Injection:  2 mg aflibercept Alfonse Flavors) SOLN   NDC: A3590391, Lot: [id number], Expiration date: 01/02/2020   Route: Intravitreal, Site: Left Eye, Waste: 0.05 mL  Post-op Post injection exam found visual acuity of at least counting fingers. The patient tolerated the procedure well. There were no complications.  The patient received written and verbal post procedure care education.                 ASSESSMENT/PLAN:    ICD-10-CM   1. Exudative age-related macular  degeneration of left eye with active choroidal neovascularization (HCC)  H35.3221 Intravitreal Injection, Pharmacologic Agent - OS - Left Eye    aflibercept (EYLEA) SOLN 2 mg  2. Exudative age-related macular degeneration of right eye with inactive scar ([place])  H35.3213   3. Retinal edema  H35.81 OCT, Retina - OU - Both Eyes  4. Pseudophakia of both eyes  Z96.1   5. Hypertensive retinopathy of both eyes  H35.033   6. Essential hypertension  I10     1-3. 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    OD- w/ inactive scar -- history of low vision, CF 3'  OS- w/ CNV   - history of multiple IVL OS in [place], [place] w/ Dr. Doristine Church -- last injection 06/2018, q4-6 wk interval  - moved to [place] in November 2019 and has been followed by Dr. Baird Cancer who had recommended observation, thus pt sought second opinion  - given functional monocular status and history of active CNV, has been receiving maintenance injections, s/p IVA #1 OS (02.25.20), #2 (04.07.20), #3 (05.05.20), #4 (06.22.20), #5 (08.03.20)  - s/p IVE OS #1 (09.14.20), #2 (10.23.20)  - OCT shows PEDs, CNV w/ tr SRF OS -- stably improved; OD with inactive scar  - BCVA back to baseline at 20/40 -- stable from prior  - recommend IVE #3 OS today, 11.30.20, w/ f/u in 6wks -- pt prefers aggressive maintenance given functional monocular stutus  - pt wishes to be treated with IVE OS  - RBA of procedure discussed, questions answered  - informed consent obtained and signed  - see procedure note  - Eylea4U benefits investigation started 9.14.20 -- approved as of 10.23.20  - f/u in 6 wks -- DFE, OCT, likely injection  4,5. Hypertensive retinopathy OU  - discussed importance of tight BP control  - monitor  6. Pseudophakia OU  - s/p CE/IOL OU  - monitor   Ophthalmic Meds Ordered this visit:  Meds ordered this encounter  Medications  . aflibercept (EYLEA) SOLN 2 mg       Return in about 6 weeks (around 09/18/2019) for f/u exu  ARMD OS, DFE, OCT.  There are no Patient Instructions on file for this visit.   Explained the diagnoses, plan, and follow up with the patient and they expressed understanding.  Patient expressed understanding of the importance of proper follow up care.   This document serves as a record of services personally performed by Gardiner Sleeper, MD, PhD. It was created on their behalf by Leeann Must, [place], a certified ophthalmic assistant. The creation of this record is the provider's dictation and/or activities during the visit.    Electronically signed by: Leeann Must, COA @TODAY @ 6:06 PM   This document serves as a record of services personally performed by Gardiner Sleeper, MD, PhD. It was created on their behalf by Ernest Mallick, OA, an ophthalmic assistant. The creation of this record is the provider's dictation and/or activities during the visit.    Electronically signed by: Ernest Mallick, OA  11.30.2020  Gardiner Sleeper, M.D., Ph.D. Diseases & Surgery of the Retina and Vitreous [facility]  I have reviewed the above documentation for accuracy and completeness, and I agree with the above. Gardiner Sleeper,  M.D., Ph.D. 08/07/19 6:06 PM    Abbreviations: M myopia (nearsighted); A astigmatism; H hyperopia (farsighted); P presbyopia; Mrx spectacle prescription;  CTL contact lenses; OD right eye; OS left eye; OU both eyes  XT exotropia; ET esotropia; PEK punctate epithelial keratitis; PEE punctate epithelial erosions; DES dry eye syndrome; MGD meibomian gland dysfunction; ATs artificial tears; PFAT's preservative free artificial tears; [place] nuclear sclerotic cataract; PSC posterior subcapsular cataract; ERM epi-retinal membrane; PVD posterior vitreous detachment; RD retinal detachment; DM diabetes mellitus; DR diabetic retinopathy; NPDR non-proliferative diabetic retinopathy; PDR proliferative diabetic retinopathy; CSME clinically significant macular edema; DME diabetic  macular edema; dbh dot blot hemorrhages; CWS cotton wool spot; POAG primary open angle glaucoma; C/D cup-to-disc ratio; HVF humphrey visual field; GVF goldmann visual field; OCT optical coherence tomography; IOP intraocular pressure; BRVO Branch retinal vein occlusion; CRVO central retinal vein occlusion; CRAO central retinal artery occlusion; BRAO branch retinal artery occlusion; RT retinal tear; SB scleral buckle; PPV pars plana vitrectomy; VH Vitreous hemorrhage; PRP panretinal laser photocoagulation; IVK intravitreal kenalog; VMT vitreomacular traction; MH Macular hole;  NVD neovascularization of the disc; NVE neovascularization elsewhere; AREDS age related eye disease study; ARMD age related macular degeneration; POAG primary open angle glaucoma; EBMD epithelial/anterior basement membrane dystrophy; ACIOL anterior chamber intraocular lens; IOL intraocular lens; PCIOL posterior chamber intraocular lens; Phaco/IOL phacoemulsification with intraocular lens placement; [place] photorefractive keratectomy; LASIK laser assisted in situ keratomileusis; HTN hypertension; DM diabetes mellitus; COPD chronic obstructive pulmonary disease

## 2019-08-07 ENCOUNTER — Encounter (INDEPENDENT_AMBULATORY_CARE_PROVIDER_SITE_OTHER): Payer: Self-pay | Admitting: Ophthalmology

## 2019-08-07 ENCOUNTER — Ambulatory Visit (INDEPENDENT_AMBULATORY_CARE_PROVIDER_SITE_OTHER): Payer: Medicare Other | Admitting: Ophthalmology

## 2019-08-07 DIAGNOSIS — H353213 Exudative age-related macular degeneration, right eye, with inactive scar: Secondary | ICD-10-CM

## 2019-08-07 DIAGNOSIS — Z961 Presence of intraocular lens: Secondary | ICD-10-CM

## 2019-08-07 DIAGNOSIS — H3581 Retinal edema: Secondary | ICD-10-CM

## 2019-08-07 DIAGNOSIS — I10 Essential (primary) hypertension: Secondary | ICD-10-CM

## 2019-08-07 DIAGNOSIS — H35033 Hypertensive retinopathy, bilateral: Secondary | ICD-10-CM

## 2019-08-07 DIAGNOSIS — H353221 Exudative age-related macular degeneration, left eye, with active choroidal neovascularization: Secondary | ICD-10-CM

## 2019-08-07 MED ORDER — AFLIBERCEPT 2MG/0.05ML IZ SOLN FOR KALEIDOSCOPE
2.0000 mg | INTRAVITREAL | Status: AC | PRN
  Administered 2019-08-07: 2 mg via INTRAVITREAL

## 2019-08-10 ENCOUNTER — Telehealth: Payer: Self-pay

## 2019-08-10 NOTE — Telephone Encounter (Signed)
The pt states his blood pressure was up yesterday. His pcp suggested that he send in a transmission. He asked me will that be helpful. I told the pt to send in the transmission and as soon as the nurse see it she will review it and give him a call back. The pt verbalized understanding and states he will send in a transmission later today or tomorrow morning. He thanked me for my help.

## 2019-08-11 NOTE — Telephone Encounter (Signed)
Patient contact # non-working #. LMOM for [place] who is listed on DPR. Remote transmission showed no episodes and device function WNL.

## 2019-08-16 NOTE — Telephone Encounter (Signed)
Spoke with patient. Advised of stable transmission results. Pt reports his BP has since returned to normal, 117/70 when checked earlier today. He is overdue for 45m f/u with EP APP, agreeable to scheduling appointment with A. Tillery, PA, on 08/30/19 at 12:10pm. No further questions at this time.

## 2019-08-17 ENCOUNTER — Encounter: Payer: Self-pay | Admitting: Gastroenterology

## 2019-08-17 ENCOUNTER — Ambulatory Visit (INDEPENDENT_AMBULATORY_CARE_PROVIDER_SITE_OTHER): Payer: Medicare Other | Admitting: Gastroenterology

## 2019-08-17 VITALS — BP 121/72 | HR 71 | Temp 98.0°F | Ht 73.0 in | Wt 178.0 lb

## 2019-08-17 DIAGNOSIS — K228 Other specified diseases of esophagus: Secondary | ICD-10-CM

## 2019-08-17 DIAGNOSIS — R1312 Dysphagia, oropharyngeal phase: Secondary | ICD-10-CM

## 2019-08-17 DIAGNOSIS — K2289 Other specified disease of esophagus: Secondary | ICD-10-CM

## 2019-08-17 NOTE — Progress Notes (Signed)
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 Brigitte Pulse for dysphagia.  He tells me it was a problem perhaps for 5 years ago while living in [place], it sounds again may have had some evaluation for that, as he recalls being given swallowing exercises which she did for a while.  For about the last 4 to 6 months he has had dysphagia where meat or dry bread such as toast will feel difficult to get down, as if it is briefly hung up in the neck.  He does not cough while swallowing solid food or liquids.  He does not have regurgitation or heartburn. Mouth feels dry much of the time.   ROS:  Review of Systems  Constitutional: Negative for appetite change and unexpected weight change.  HENT: Negative for mouth sores and voice change.   Eyes: Negative for pain and redness.       Visual loss or macular degeneration  Respiratory: Negative for cough and shortness of breath.   Cardiovascular: Negative for chest pain and palpitations.  Genitourinary: Negative for dysuria and hematuria.  Musculoskeletal: Positive for arthralgias. Negative for myalgias.       Uses a cane for ambulation  Skin: Negative for pallor and rash.  Neurological: Negative for weakness and headaches.  Hematological: Negative for adenopathy.     Past Medical History: Past Medical History:  Diagnosis Date  . Antiphospholipid antibody positive   . Aortic aneurysm ([place])   . Aortic dissection ([place])   . Central retinal artery occlusion   . Chronic anemia   . Chronic renal insufficiency   . COPD (chronic obstructive pulmonary disease) ([place])   . DDD (degenerative disc disease), cervical   . DJD (degenerative joint disease)   . DVT (deep venous thrombosis) ([place])   . Gastric dysmotility   . GERD  (gastroesophageal reflux disease)   . Gout   . HTN (hypertension)   . Hyperlipidemia   . Myelodysplasia (myelodysplastic syndrome) ([place])   . Paget's disease of bone   . Permanent atrial fibrillation ([place])   . Rheumatoid arthritis ([place])   . Symptomatic bradycardia   . TIA (transient ischemic attack)    I reviewed his last cardiology office note December 2019 where he establish care for history of A. fib with bradycardia requiring pacemaker. (CHADS score "at least 6")  Also noted to have anti for lipid antibody syndrome and history of DVT  Primary cardiologist is Dr. Gwenlyn Found   I also reviewed cardiothoracic surgery consultation regarding his chronic medical management of thoracic aortic aneurysm  Past Surgical History: Past Surgical History:  Procedure Laterality Date  . ATRIAL FIBRILLATION ABLATION  09/23/2014   Dr Encarnacion Chu in [place]  . BACK SURGERY    . CAROTID ENDARTERECTOMY    . CAROTID STENT    . CAT[place]ACT EXTRACTION Bilateral 2010   [place]  . EYE SURGERY    . LUNG DECORTICATION     VATS  . PACEMAKER GENERATOR CHANGE  12/03/2011   MDT Adapta ADDR01 generator change by Dr Encarnacion Chu for symptomatic bradycardia  . PACEMAKER IMPLANT  2004   MDT      Family History: Family History  Family history unknown: Yes    Social History: Social History   Socioeconomic History  . Marital status: Widowed    Spouse name: Not on file  .  Number of children: Not on file  . Years of education: Not on file  . Highest education level: Not on file  Occupational History  . Occupation: retired  Tobacco Use  . Smoking status: Former Smoker    Types: Cigarettes    Quit date: 1961    Years since quitting: 59.9  . Smokeless tobacco: Never Used  Substance and Sexual Activity  . Alcohol use: Yes    Alcohol/week: 2.0 standard drinks    Types: 2 Glasses of wine per week    Comment: daily  . Drug use: Never  . Sexual activity: Not on file  Other Topics Concern  . Not on file  Social  History Narrative   Previously lived in [place]   Now lives in [place] ([place])   Retired Chief Financial Officer   Social Determinants of Radio broadcast assistant Strain:   . Difficulty of Paying Living Expenses: Not on [place]:   . Worried About Charity fundraiser in the Last Year: Not on file  . Ran Out of Food in the Last Year: Not on file  Transportation Needs:   . Lack of Transportation (Medical): Not on file  . Lack of Transportation (Non-Medical): Not on file  Physical Activity:   . Days of Exercise per Week: Not on file  . Minutes of Exercise per Session: Not on file  Stress:   . Feeling of Stress : Not on file  Social Connections:   . Frequency of Communication with Friends and Family: Not on file  . Frequency of Social Gatherings with Friends and Family: Not on file  . Attends Religious Services: Not on file  . Active Member of Clubs or Organizations: Not on file  . Attends Archivist Meetings: Not on file  . Marital Status: Not on file    Allergies: Allergies  Allergen Reactions  . Iodinated Diagnostic Agents Rash    Outpatient Meds: Current Outpatient Medications  Medication Sig Dispense Refill  . allopurinol (ZYLOPRIM) 100 MG tablet TK 1 T PO D    . carvedilol (COREG) 6.25 MG tablet Take 6.25 mg by mouth 2 (two) times daily.    . Cholecalciferol (VITAMIN D3) 50 MCG (2000 UT) TABS Take 1 tablet by mouth 2 (two) times daily.    Marland Kitchen dicyclomine (BENTYL) 10 MG capsule Take 10 mg by mouth 4 (four) times daily.    . digoxin (LANOXIN) 0.125 MG tablet Take 0.125 mg by mouth as directed. Patient takes medication on M/W/F    . Ferrous Sulfate (IRON) 325 (65 Fe) MG TABS Take 1 tablet by mouth 2 (two) times daily.    . furosemide (LASIX) 40 MG tablet Take 40 mg by mouth daily.    Marland Kitchen gabapentin (NEURONTIN) 400 MG capsule Take 400 mg by mouth at bedtime.    Marland Kitchen levothyroxine (SYNTHROID, LEVOTHROID) 88 MCG tablet Take 88 mcg by mouth daily before  breakfast.    . Misc Natural Products (GLUCOSAMINE CHOND COMPLEX/MSM PO) Take 1 tablet by mouth daily.    . Multiple Vitamins-Minerals (PRESERVISION AREDS PO) Take 1 tablet by mouth 2 (two) times daily.    Marland Kitchen rOPINIRole (REQUIP) 0.5 MG tablet     . rosuvastatin (CRESTOR) 10 MG tablet Take 10 mg by mouth daily.    . saw palmetto 500 MG capsule Take 500 mg by mouth 2 (two) times daily.    . tamsulosin (FLOMAX) 0.4 MG CAPS capsule Take 0.4 mg by mouth daily.    Marland Kitchen  temazepam (RESTORIL) 15 MG capsule Take 15 mg by mouth at bedtime as needed for sleep.    Marland Kitchen warfarin (COUMADIN) 2 MG tablet Take 2 mg by mouth daily.    . NON FORMULARY Antifungal nail cream from [facility] - faxed 03/27/2019  [facility]     Current Facility-Administered Medications  Medication Dose Route Frequency Provider Last Rate Last Admin  . Bevacizumab (AVASTIN) SOLN 1.25 mg  1.25 mg Intravitreal  [name], MD   1.25 mg at 11/01/18 2343      ___________________________________________________________________ Objective   Exam:  BP 121/72   Pulse 71   Temp 98 F (36.7 C)   Ht 6\' 1"  (1.854 m)   Wt 178 lb (80.7 kg)   SpO2 98%   BMI 23.48 kg/m    General: Well-appearing, pleasant and conversational with normal vocal quality.  Gets on exam table without assistance  Eyes: sclera anicteric, no redness  ENT: oral mucosa moist without lesions, no cervical or supraclavicular lymphadenopathy.  Natural dentition in generally good repair  CV: RRR with 4 out of 6 systolic murmur, C9/S4, no JVD, mild peripheral edema, venous stasis changes  Resp: clear to auscultation bilaterally, normal RR and effort noted  GI: soft, no tenderness, with active bowel sounds. No guarding or palpable organomegaly noted.  Skin; warm and dry, no rash or jaundice noted  Neuro: awake, alert and oriented x 3. Normal gross motor function and fluent speech.  Slow antalgic gait  Labs:  Echocardiogram on 09/12/2018: Study Conclusions    - Left ventricle: The cavity size was normal. Wall thickness was   increased in a pattern of moderate LVH. Systolic function was   vigorous. The estimated ejection fraction was in the range of 65%   to 70%. Wall motion was normal; there were no regional wall   motion abnormalities. The study was not technically sufficient to   allow evaluation of LV diastolic dysfunction due to atrial   fibrillation. - Aortic valve: Trileaflet; normal thickness, mildly calcified   leaflets. There was trivial regurgitation. - Aorta: Ascending aortic diameter: 41 mm (S). - Ascending aorta: The ascending aorta was mildly dilated. - Mitral valve: Calcified annulus. There was mild regurgitation. - Left atrium: The atrium was moderately dilated. - Right atrium: The atrium was mildly dilated. - Inferior vena cava: The vessel was dilated. Consistent with   elevated central venous pressure.      Radiologic Studies:  CLINICAL DATA:  Dysphagia.   EXAM: ESOPHOGRAM / BARIUM SWALLOW / BARIUM TABLET STUDY   TECHNIQUE: Combined double contrast and single contrast examination performed using effervescent crystals, thick barium liquid, and thin barium liquid. The patient was observed with fluoroscopy swallowing a 13 mm barium sulphate tablet.   FLUOROSCOPY TIME:  Fluoroscopy Time:  2 minutes and 42 seconds.   Radiation Exposure Index (if provided by the fluoroscopic device): 170 mGy   Number of Acquired Spot Images:   COMPARISON:  None.   FINDINGS: Frontal and lateral views of the hypopharynx while swallowing thick barium demonstrate a prominent cricopharyngeus impression. There is a small esophageal web in the proximal esophagus. Surgical clips in the neck suggest prior thyroidectomy.   Double contrast imaging of the esophagus shows no evidence for esophageal stricture, mass lesion, mucosal ulceration, or diverticulum. Small volume aspiration noted during the double contrast portion of this exam  with no cough reflex elicited.   Evaluation of esophageal motility reveals disruption of primary peristalsis in the middle third of the esophagus on multiple  swallows consistent with nonspecific esophageal motility disorder. There are some tertiary contractions in the middle and distal thirds of the esophagus without overt presbyesophagus. 13 mm barium tablet passes readily into the stomach when taken with water.   IMPRESSION: 1. Nonspecific esophageal motility disorder. 2. No esophageal mass lesion or stricture. There is a small web in the cervical esophagus that does not obstruct passage of a 13 mm barium tablet. 3. A single episode of aspiration was observed during the double contrast portion of this exam. No cough reflex was elicited.     Electronically Signed   By: Misty Stanley M.D.   On: 07/25/2019 10:10  (Images personally reviewed)  Assessment: Encounter Diagnoses  Name Primary?  Marland Kitchen Oropharyngeal dysphagia Yes  . Presbyesophagus     Oropharyngeal dysphagia.  He also has some nonspecific esophageal body dysmotility seen on imaging.  The reported esophageal web is slight and not significantly compromising the esophageal lumen.  13 mm barium tablet passes without difficulty, and the patient reports no difficulty swallowing pills.  He does not need upper endoscopy/dilation.  We discussed the nature of this, necessary dietary modifications and it is probably made worse by dry mouth.  This may be age-related from decreased saliva production and/or medicine side effect.  It is not clear why he is on dicyclomine, but I recommend stopping that because of the anticholinergic side effect.  Plan:  He was given verbal and also written dietary recommendations.  Level 4 dysphagia diet, particularly meat should be ground or chopped finely, food well lubricated, plenty to drink.  Chin tuck maneuver also recommended and demonstrated.  If he has increasing difficulty, and particularly  if he develops coughing while eating, then he should have modified barium study.  He does not seem to need that at this point, and I would also like to decrease his exposure to the hospital setting during Covid surge.  Thank you for the courtesy of this consult.  Please call me with any questions or concerns. (Total 45-minute time, over half spent face-to-face with patient counseling and coordinating care)  Nelida Meuse III  CC: Referring provider noted above

## 2019-08-17 NOTE — Patient Instructions (Signed)
If you are age 83 or older, your body mass index should be between 23-30. Your Body mass index is 23.48 kg/m. If this is out of the aforementioned range listed, please consider follow up with your Primary Care Provider.  If you are age 66 or younger, your body mass index should be between 19-25. Your Body mass index is 23.48 kg/m. If this is out of the aformentioned range listed, please consider follow up with your Primary Care Provider.   Follow up as needed. [phone]  It was a pleasure to see you today!  Dr. Loletha Carrow

## 2019-08-30 ENCOUNTER — Encounter: Payer: Self-pay | Admitting: Student

## 2019-08-30 ENCOUNTER — Ambulatory Visit (INDEPENDENT_AMBULATORY_CARE_PROVIDER_SITE_OTHER): Payer: Medicare Other | Admitting: Student

## 2019-08-30 ENCOUNTER — Other Ambulatory Visit: Payer: Self-pay

## 2019-08-30 VITALS — BP 124/70 | HR 67 | Ht 73.0 in | Wt 178.0 lb

## 2019-08-30 DIAGNOSIS — I340 Nonrheumatic mitral (valve) insufficiency: Secondary | ICD-10-CM | POA: Diagnosis not present

## 2019-08-30 DIAGNOSIS — I10 Essential (primary) hypertension: Secondary | ICD-10-CM

## 2019-08-30 DIAGNOSIS — R079 Chest pain, unspecified: Secondary | ICD-10-CM | POA: Diagnosis not present

## 2019-08-30 DIAGNOSIS — I4821 Permanent atrial fibrillation: Secondary | ICD-10-CM | POA: Diagnosis not present

## 2019-08-30 DIAGNOSIS — R001 Bradycardia, unspecified: Secondary | ICD-10-CM

## 2019-08-30 LAB — CUP PACEART INCLINIC DEVICE CHECK
Battery Impedance: 1807 Ohm
Battery Remaining Longevity: 41 mo
Battery Voltage: 2.75 V
Brady Statistic RV Percent Paced: 7 %
Date Time Interrogation Session: 20201223131743
Implantable Lead Implant Date: 20041008
Implantable Lead Implant Date: 20041008
Implantable Lead Location: 753859
Implantable Lead Location: 753860
Implantable Lead Model: 5076
Implantable Lead Model: 5076
Implantable Pulse Generator Implant Date: 20130328
Lead Channel Impedance Value: 67 Ohm
Lead Channel Impedance Value: 813 Ohm
Lead Channel Pacing Threshold Amplitude: 1 V
Lead Channel Pacing Threshold Pulse Width: 1 ms
Lead Channel Sensing Intrinsic Amplitude: 8 mV
Lead Channel Setting Pacing Amplitude: 2.5 V
Lead Channel Setting Pacing Pulse Width: 1 ms
Lead Channel Setting Sensing Sensitivity: 4 mV

## 2019-08-30 NOTE — Patient Instructions (Addendum)
Medication Instructions:   Your physician recommends that you continue on your current medications as directed. Please refer to the Current Medication list given to you today.  *If you need a refill on your cardiac medications before your next appointment, please call your pharmacy*  Lab Work:  BMET CBC AND DIGOXIN today    If you have labs (blood work) drawn today and your tests are completely normal, you will receive your results only by: Marland Kitchen MyChart Message (if you have MyChart) OR . A paper copy in the mail If you have any lab test that is abnormal or we need to change your treatment, we will call you to review the results.  Testing/Procedures:  NONE ORDERED  TODAY   Follow-Up: At [facility], you and your health needs are our priority.  As part of our continuing mission to provide you with exceptional heart care, we have created designated Provider Care Teams.  These Care Teams include your primary Cardiologist (physician) and Advanced Practice Providers (APPs -  Physician Assistants and Nurse Practitioners) who all work together to provide you with the care you need, when you need it.  Your next appointment:   6 month(s)  The format for your next appointment:   In Person  Provider:  Legrand Como "[name], PA-C  Other Instructions

## 2019-08-30 NOTE — Progress Notes (Signed)
Electrophysiology Office Note Date: 08/30/2019  ID:  Steven Williams, DOB September 04, 1930, MRN [id number]  PCP: [name], MD Electrophysiologist: Dr. Rayann Heman   CC: Pacemaker follow-up  Steven Williams is a 85 y.o. male seen today for Dr. Rayann Heman . he presents today for routine electrophysiology followup.  Since last being seen in our clinic, the patient reports doing very well.  he denies chest pain, palpitations, dyspnea, PND, orthopnea, nausea, vomiting, dizziness, syncope, edema, weight gain, or early satiety.  He fell last week but denies syncope or LOC.  Device History: Medtronic Dual Chamber PPM implanted 11/2011 for symptomatic bradycardia  Past Medical History:  Diagnosis Date  . Antiphospholipid antibody positive   . Aortic aneurysm ([place])   . Aortic dissection ([place])   . Central retinal artery occlusion   . Chronic anemia   . Chronic renal insufficiency   . COPD (chronic obstructive pulmonary disease) ([place])   . DDD (degenerative disc disease), cervical   . DJD (degenerative joint disease)   . DVT (deep venous thrombosis) ([place])   . Gastric dysmotility   . GERD (gastroesophageal reflux disease)   . Gout   . HTN (hypertension)   . Hyperlipidemia   . Myelodysplasia (myelodysplastic syndrome) ([place])   . Paget's disease of bone   . Permanent atrial fibrillation ([place])   . Rheumatoid arthritis ([place])   . Symptomatic bradycardia   . TIA (transient ischemic attack)    Past Surgical History:  Procedure Laterality Date  . ATRIAL FIBRILLATION ABLATION  09/23/2014   Dr Encarnacion Chu in [place]  . BACK SURGERY    . CAROTID ENDARTERECTOMY    . CAROTID STENT    . CATARACT EXTRACTION Bilateral 2010   [place]  . EYE SURGERY    . LUNG DECORTICATION     VATS  . PACEMAKER GENERATOR CHANGE  12/03/2011   MDT Adapta ADDR01 generator change by Dr Encarnacion Chu for symptomatic bradycardia  . PACEMAKER IMPLANT  2004   MDT     Current Outpatient Medications  Medication Sig Dispense Refill  .  allopurinol (ZYLOPRIM) 100 MG tablet TK 1 T PO D    . carvedilol (COREG) 6.25 MG tablet Take 6.25 mg by mouth 2 (two) times daily.    . Cholecalciferol (VITAMIN D3) 50 MCG (2000 UT) TABS Take 1 tablet by mouth 2 (two) times daily.    Marland Kitchen dicyclomine (BENTYL) 10 MG capsule Take 10 mg by mouth 4 (four) times daily.    . digoxin (LANOXIN) 0.125 MG tablet Take 0.125 mg by mouth as directed. Patient takes medication on M/W/F    . Ferrous Sulfate (IRON) 325 (65 Fe) MG TABS Take 1 tablet by mouth 2 (two) times daily.    . furosemide (LASIX) 40 MG tablet Take 40 mg by mouth daily.    Marland Kitchen gabapentin (NEURONTIN) 400 MG capsule Take 400 mg by mouth at bedtime.    Marland Kitchen levothyroxine (SYNTHROID, LEVOTHROID) 88 MCG tablet Take 88 mcg by mouth daily before breakfast.    . Misc Natural Products (GLUCOSAMINE CHOND COMPLEX/MSM PO) Take 1 tablet by mouth daily.    . Multiple Vitamins-Minerals (PRESERVISION AREDS PO) Take 1 tablet by mouth 2 (two) times daily.    . NON FORMULARY Antifungal nail cream from [facility] - faxed 03/27/2019  [facility]    . rOPINIRole (REQUIP) 0.5 MG tablet     . rosuvastatin (CRESTOR) 10 MG tablet Take 10 mg by mouth daily.    . saw palmetto 500 MG capsule Take 500 mg by  mouth 2 (two) times daily.    . tamsulosin (FLOMAX) 0.4 MG CAPS capsule Take 0.4 mg by mouth daily.    . temazepam (RESTORIL) 15 MG capsule Take 15 mg by mouth at bedtime as needed for sleep.    Marland Kitchen warfarin (COUMADIN) 2 MG tablet Take 2 mg by mouth daily.     Current Facility-Administered Medications  Medication Dose Route Frequency Provider Last Rate Last Admin  . Bevacizumab (AVASTIN) SOLN 1.25 mg  1.25 mg Intravitreal  [name], MD   1.25 mg at 11/01/18 2343    Allergies:   Iodinated diagnostic agents   Social History: Social History   Socioeconomic History  . Marital status: Widowed    Spouse name: Not on file  . Number of children: Not on file  . Years of education: Not on file  . Highest education  level: Not on file  Occupational History  . Occupation: retired  Tobacco Use  . Smoking status: Former Smoker    Types: Cigarettes    Quit date: 1961    Years since quitting: 60.0  . Smokeless tobacco: Never Used  Substance and Sexual Activity  . Alcohol use: Yes    Alcohol/week: 2.0 standard drinks    Types: 2 Glasses of wine per week    Comment: daily  . Drug use: Never  . Sexual activity: Not on file  Other Topics Concern  . Not on file  Social History Narrative   Previously lived in [place]   Now lives in [place] ([place])   Retired Chief Financial Officer   Social Determinants of Radio broadcast assistant Strain:   . Difficulty of Paying Living Expenses: Not on [place]:   . Worried About Charity fundraiser in the Last Year: Not on file  . Ran Out of Food in the Last Year: Not on file  Transportation Needs:   . Lack of Transportation (Medical): Not on file  . Lack of Transportation (Non-Medical): Not on file  Physical Activity:   . Days of Exercise per Week: Not on file  . Minutes of Exercise per Session: Not on file  Stress:   . Feeling of Stress : Not on file  Social Connections:   . Frequency of Communication with Friends and Family: Not on file  . Frequency of Social Gatherings with Friends and Family: Not on file  . Attends Religious Services: Not on file  . Active Member of Clubs or Organizations: Not on file  . Attends Archivist Meetings: Not on file  . Marital Status: Not on file  Intimate Partner Violence:   . Fear of Current or Ex-Partner: Not on file  . Emotionally Abused: Not on file  . Physically Abused: Not on file  . Sexually Abused: Not on file    Family History: Family History  Family history unknown: Yes     Review of Systems: All other systems reviewed and are otherwise negative except as noted above.  Physical Exam: Vitals:   08/30/19 1222  BP: 124/70  Pulse: 67  Weight: 178 lb (80.7 kg)  Height: 6\' 1"   (1.854 m)     GEN- The patient is well appearing, alert and oriented x 3 today.   HEENT: normocephalic, atraumatic; sclera clear, conjunctiva pink; hearing intact; oropharynx clear; neck supple  Lungs- Clear to ausculation bilaterally, normal work of breathing.  No wheezes, rales, rhonchi Heart- Regular rate and rhythm, no murmurs, rubs or gallops  GI- soft, non-tender, non-distended, bowel sounds  present  Extremities- no clubbing, cyanosis, or edema  MS- no significant deformity or atrophy Skin- warm and dry, no rash or lesion; PPM pocket well healed Psych- euthymic mood, full affect Neuro- strength and sensation are intact  PPM Interrogation- reviewed in detail today,  See PACEART report  EKG:  EKG is ordered today. Personal review shows AF with controlled VR at 67 bpm  Recent Labs: No results found for requested labs within last 8760 hours.   Wt Readings from Last 3 Encounters:  08/30/19 178 lb (80.7 kg)  08/17/19 178 lb (80.7 kg)  04/11/19 174 lb (78.9 kg)     Other studies Reviewed: Additional studies/ records that were reviewed today include: Echo 09/2018 shows LVEF 65-70%, Previous EP office notes, Previous remote checks, Most recent labwork.   Assessment and Plan:  1.  Symptomatic bradycardia s/p Medtronic PPM  Normal PPM function See Pace Art report RV output is chronically programmed 2.5V @ 1 ms due to chronically elevated threshold. (V paced 7%) No changes today  2. Permanent Afib Rate controlled for the most part Continue coumadin for CHA2DS2VASC of at least 6   Digoxin level today.   3. HTN Stable  4. Thoracic/abominal aneurysm Followed by Dr. Roxan Hockey  5. MR murmur Normal EF echo 09/2018 with mild MR  Current medicines are reviewed at length with the patient today.   The patient does not have concerns regarding his medicines.  The following changes were made today:  none  Labs/ tests ordered today include:  Orders Placed This Encounter   Procedures  . CBC  . Basic metabolic panel  . Digoxin level  . EKG 12-Lead    Disposition:   Follow up with EP APP in 6 months, Dr. Rayann Heman in 1 year.   Jacalyn Lefevre, PA-C  08/30/2019 1:17 PM  [place] [address] [phone] (office) [phone] (fax)

## 2019-08-31 ENCOUNTER — Telehealth: Payer: Self-pay

## 2019-08-31 DIAGNOSIS — I4821 Permanent atrial fibrillation: Secondary | ICD-10-CM

## 2019-08-31 LAB — BASIC METABOLIC PANEL
BUN/Creatinine Ratio: 22 (ref 10–24)
BUN: 31 mg/dL — ABNORMAL HIGH (ref 8–27)
CO2: 24 mmol/L (ref 20–29)
Calcium: 9 mg/dL (ref 8.6–10.2)
Chloride: 105 mmol/L (ref 96–106)
Creatinine, Ser: 1.44 mg/dL — ABNORMAL HIGH (ref 0.76–1.27)
GFR calc Af Amer: 50 mL/min/{1.73_m2} — ABNORMAL LOW (ref >59)
GFR calc non Af Amer: 43 mL/min/{1.73_m2} — ABNORMAL LOW (ref >59)
Glucose: 105 mg/dL — ABNORMAL HIGH (ref 65–99)
Potassium: 4 mmol/L (ref 3.5–5.2)
Sodium: 141 mmol/L (ref 134–144)

## 2019-08-31 LAB — CBC
Hematocrit: 31.1 % — ABNORMAL LOW (ref 37.5–51.0)
Hemoglobin: 10.3 g/dL — ABNORMAL LOW (ref 13.0–17.7)
MCH: 33.2 pg — ABNORMAL HIGH (ref 26.6–33.0)
MCHC: 33.1 g/dL (ref 31.5–35.7)
MCV: 100 fL — ABNORMAL HIGH (ref 79–97)
Platelets: 109 10*3/uL — ABNORMAL LOW (ref 150–450)
RBC: 3.1 x10E6/uL — ABNORMAL LOW (ref 4.14–5.80)
RDW: 12.5 % (ref 11.6–15.4)
WBC: 4.9 10*3/uL (ref 3.4–10.8)

## 2019-08-31 LAB — DIGOXIN LEVEL: Digoxin, Serum: 1.2 ng/mL — ABNORMAL HIGH (ref 0.5–0.9)

## 2019-08-31 NOTE — Telephone Encounter (Signed)
-----   Message from Cristopher Estimable, RN sent at 08/31/2019  8:50 AM EST -----  ----- Message ----- From: Shirley Friar, PA-C Sent: 08/31/2019   7:50 AM EST To: Cv Div [facility] Triage  No baseline to compare for CBC/BMET.   Digoxin level elevated, but he was scheduled to take that morning.  For safety, he should have a repeat digoxin level after the holidays, on a day he does NOT take digoxin (Tuesday or Thursday).   Legrand Como [address], PA-C  08/31/2019 7:50 AM

## 2019-08-31 NOTE — Telephone Encounter (Signed)
LMTCB advising results and recommendations available

## 2019-09-04 NOTE — Telephone Encounter (Signed)
Pt calling back to request to change lab appt to Wednesday.  Explained it needs to be on a day he does not take Digoxin.

## 2019-09-04 NOTE — Telephone Encounter (Signed)
Follow up ° ° ° ° °Please return call to patient °

## 2019-09-04 NOTE — Addendum Note (Signed)
Addended by: Trena Platt E on: 09/04/2019 01:57 PM   Modules accepted: Orders

## 2019-09-04 NOTE — Telephone Encounter (Signed)
Spoke with pt.  Advised of elevated Digoxin level, recommendation for labwork to be redrawn on day he is not taking Digoxin.  Pt will be in office on Thursday for coumadin clinic, lab appt scheduled, order entered.

## 2019-09-07 ENCOUNTER — Other Ambulatory Visit: Payer: Medicare Other

## 2019-09-11 NOTE — Progress Notes (Signed)
[facility] Note  09/18/2019     CHIEF COMPLAINT Patient presents for Retina Follow Up   HISTORY OF PRESENT ILLNESS: Steven Williams is a 86 y.o. male who presents to the clinic today for:   HPI    Retina Follow Up    Patient presents with  Wet AMD.  In left eye.  Severity is moderate.  Duration of 6 weeks.  Since onset it is stable.  I, the attending physician,  performed the HPI with the patient and updated documentation appropriately.          Comments    Patient states vision slightly worse OS.        Last edited by [name], MD on 09/18/2019 11:13 AM. (History)    pt states he feels like his left eye vision is worse  Referring physician:   HISTORICAL INFORMATION:   Selected notes from the [facility] Referred by [name], [place] LEE:  Ocular Hx- PMH-    CURRENT MEDICATIONS: No current outpatient medications on file. (Ophthalmic Drugs)   No current facility-administered medications for this visit. (Ophthalmic Drugs)   Current Outpatient Medications (Other)  Medication Sig  . allopurinol (ZYLOPRIM) 100 MG tablet TK 1 T PO D  . carvedilol (COREG) 6.25 MG tablet Take 6.25 mg by mouth 2 (two) times daily.  . Cholecalciferol (VITAMIN D3) 50 MCG (2000 UT) TABS Take 1 tablet by mouth 2 (two) times daily.  Marland Kitchen dicyclomine (BENTYL) 10 MG capsule Take 10 mg by mouth 4 (four) times daily.  . digoxin (LANOXIN) 0.125 MG tablet Take 0.125 mg by mouth as directed. Patient takes medication on M/W/F  . Ferrous Sulfate (IRON) 325 (65 Fe) MG TABS Take 1 tablet by mouth 2 (two) times daily.  . furosemide (LASIX) 40 MG tablet Take 40 mg by mouth daily.  Marland Kitchen gabapentin (NEURONTIN) 400 MG capsule Take 400 mg by mouth at bedtime.  Marland Kitchen levothyroxine (SYNTHROID, LEVOTHROID) 88 MCG tablet Take 88 mcg by mouth daily before breakfast.  . Misc Natural Products (GLUCOSAMINE CHOND COMPLEX/MSM PO) Take 1 tablet by mouth daily.  . Multiple  Vitamins-Minerals (PRESERVISION AREDS PO) Take 1 tablet by mouth 2 (two) times daily.  . NON FORMULARY Antifungal nail cream from [place] - faxed 03/27/2019  [facility]  . rOPINIRole (REQUIP) 0.5 MG tablet   . rosuvastatin (CRESTOR) 10 MG tablet Take 10 mg by mouth daily.  . saw palmetto 500 MG capsule Take 500 mg by mouth 2 (two) times daily.  . tamsulosin (FLOMAX) 0.4 MG CAPS capsule Take 0.4 mg by mouth daily.  . temazepam (RESTORIL) 15 MG capsule Take 15 mg by mouth at bedtime as needed for sleep.  Marland Kitchen warfarin (COUMADIN) 2 MG tablet Take 2 mg by mouth daily.   Current Facility-Administered Medications (Other)  Medication Route  . Bevacizumab (AVASTIN) SOLN 1.25 mg Intravitreal      REVIEW OF SYSTEMS: ROS    Positive for: Endocrine, Eyes   Negative for: Constitutional, Gastrointestinal, Neurological, Skin, Genitourinary, Musculoskeletal, HENT, Cardiovascular, Respiratory, Psychiatric, Allergic/Imm, Heme/Lymph   Last edited by Roselee Nova D, COT on 09/18/2019 10:19 AM. (History)       ALLERGIES Allergies  Allergen Reactions  . Iodinated Diagnostic Agents Rash    PAST MEDICAL HISTORY Past Medical History:  Diagnosis Date  . Antiphospholipid antibody positive   . Aortic aneurysm ([place])   . Aortic dissection ([place])   . Central retinal artery occlusion   . Chronic  anemia   . Chronic renal insufficiency   . COPD (chronic obstructive pulmonary disease) ([place])   . DDD (degenerative disc disease), cervical   . DJD (degenerative joint disease)   . DVT (deep venous thrombosis) ([place])   . Gastric dysmotility   . GERD (gastroesophageal reflux disease)   . Gout   . HTN (hypertension)   . Hyperlipidemia   . Myelodysplasia (myelodysplastic syndrome) ([place])   . Paget's disease of bone   . Permanent atrial fibrillation ([place])   . Rheumatoid arthritis ([place])   . Symptomatic bradycardia   . TIA (transient ischemic attack)    Past Surgical History:  Procedure Laterality Date   . ATRIAL FIBRILLATION ABLATION  09/23/2014   Dr Encarnacion Chu in [place]  . BACK SURGERY    . CAROTID ENDARTERECTOMY    . CAROTID STENT    . CATARACT EXTRACTION Bilateral 2010   [place]  . EYE SURGERY    . LUNG DECORTICATION     [facility]  . PACEMAKER GENERATOR CHANGE  12/03/2011   MDT Adapta [id number] generator change by Dr Encarnacion Chu for symptomatic bradycardia  . PACEMAKER IMPLANT  2004   MDT     FAMILY HISTORY Family History  Family history unknown: Yes    SOCIAL HISTORY Social History   Tobacco Use  . Smoking status: Former Smoker    Types: Cigarettes    Quit date: 1961    Years since quitting: 60.0  . Smokeless tobacco: Never Used  Substance Use Topics  . Alcohol use: Yes    Alcohol/week: 2.0 standard drinks    Types: 2 Glasses of wine per week    Comment: daily  . Drug use: Never         OPHTHALMIC EXAM:  Base Eye Exam    Visual Acuity (Snellen - Linear)      Right Left   Dist [place] CF at 5' 20/40 +2   Dist ph [place] NI NI       Tonometry (Tonopen, 10:29 AM)      Right Left   Pressure 12 12       Pupils      Dark Light Shape React APD   Right 3 2 Round Brisk None   Left 2 1 Round Slow None       Visual Fields      Left Right     Full   Restrictions Central scotoma        Extraocular Movement      Right Left    Full, Ortho Full, Ortho       Neuro/Psych    Oriented x3: Yes   Mood/Affect: Normal       Dilation    Both eyes: 1.0% Mydriacyl, 2.5% Phenylephrine @ 10:29 AM        Slit Lamp and Fundus Exam    Slit Lamp Exam      Right Left   Lids/Lashes Dermatochalasis - upper lid, Meibomian gland dysfunction Dermatochalasis - upper lid, Meibomian gland dysfunction   Conjunctiva/Sclera White and quiet White and quiet   Cornea Arcus, 2-3+ Punctate epithelial erosions, Well healed cataract wounds Arcus, 3-4+ Punctate epithelial erosions centrally, Well healed cataract wounds   Anterior Chamber Deep and quiet Deep and quiet   Iris Round and poorly dilated  Round and poorly dilated to 4.5   Lens Posterior chamber intraocular lens, trace Posterior capsular opacification Posterior chamber intraocular lens, trace Posterior capsular opacification   Vitreous Vitreous syneresis, Posterior vitreous detachment Vitreous syneresis  Fundus Exam      Right Left   Disc +cupping, trace pallor, sharp rim, 360 PPA 360 PPA, +cupping   C/D Ratio 0.7 0.7   Macula Flat, diffuse RPE atrophy, central pigment clumping, No heme or edema Flat, Blunted foveal reflex, Drusen, RPE mottling, clumping and atrophy, No heme or edema, ?trace cystic changes   Vessels Vascular attenuation Vascular attenuation, Tortuous   Periphery Attached, mild reticular degeneration, mild paving stone inferiorly Attached, mild reticular degeneration        Refraction    Manifest Refraction      Dist VA   Right    Left 20/40+1          IMAGING AND PROCEDURES  Imaging and Procedures for @TODAY @  OCT, Retina - OU - Both Eyes       Right Eye Quality was good. Central Foveal Thickness: 317. Progression has been stable. Findings include abnormal foveal contour, no IRF, no SRF, subretinal hyper-reflective material, retinal drusen , outer retinal atrophy, pigment epithelial detachment.   Left Eye Quality was good. Central Foveal Thickness: 257. Progression has been stable. Findings include abnormal foveal contour, no SRF, outer retinal atrophy, retinal drusen , pigment epithelial detachment, subretinal hyper-reflective material, no IRF (Mild interval increase in cystic changes temporal macula).   Notes *Images captured and stored on drive  Diagnosis / Impression:  Exudative ARMD OU OD: +atrophic macular scar OS: Mild interval increase in cystic changes temporal macula   Clinical management:  See below  Abbreviations: NFP - Normal foveal profile. CME - cystoid macular edema. PED - pigment epithelial detachment. IRF - intraretinal fluid. SRF - subretinal fluid. EZ -  ellipsoid zone. ERM - epiretinal membrane. ORA - outer retinal atrophy. ORT - outer retinal tubulation. SRHM - subretinal hyper-reflective material        Intravitreal Injection, Pharmacologic Agent - OS - Left Eye       Time Out 09/18/2019. 10:10 AM. Confirmed correct patient, procedure, site, and patient consented.   Anesthesia Topical anesthesia was used. Anesthetic medications included Lidocaine 2%, Proparacaine 0.5%.   Procedure Preparation included 5% betadine to ocular surface, eyelid speculum. A supplied (32 g) needle was used.   Injection:  2 mg aflibercept Alfonse Flavors) SOLN   NDC: A3590391, Lot: [id number], Expiration date: 03/03/2020   Route: Intravitreal, Site: Left Eye, Waste: 0.05 mL  Post-op Post injection exam found visual acuity of at least counting fingers. The patient tolerated the procedure well. There were no complications. The patient received written and verbal post procedure care education.                 ASSESSMENT/PLAN:    ICD-10-CM   1. Exudative age-related macular degeneration of left eye with active choroidal neovascularization (HCC)  H35.3221 Intravitreal Injection, Pharmacologic Agent - OS - Left Eye    aflibercept (EYLEA) SOLN 2 mg  2. Exudative age-related macular degeneration of right eye with inactive scar ([place])  H35.3213   3. Retinal edema  H35.81 OCT, Retina - OU - Both Eyes  4. Essential hypertension  I10   5. Hypertensive retinopathy of both eyes  H35.033   6. Pseudophakia of both eyes  Z96.1     1-3. 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    OD- w/ inactive scar -- history of low vision, CF 3'  OS- w/ CNV   - history of multiple IVL OS in [place], [place] w/ Dr. Doristine Church -- last injection 06/2018, q4-6 wk interval  - moved to  [place] in November 2019 and has been followed by Dr. Baird Cancer who had recommended observation, thus pt sought second opinion  - given functional monocular status and history of active CNV, has  been receiving maintenance injections, s/p IVA #1 OS (02.25.20), #2 (04.07.20), #3 (05.05.20), #4 (06.22.20), #5 (08.03.20)  - s/p IVE OS #1 (09.14.20), #2 (10.23.20), #3 (11.30.20)  - OCT shows PEDs, CNV w/ tr cystic changes OS; OD with inactive scar  - BCVA 20/40 -- stable from prior  - recommend IVE #4 OS today, 01.11.21, w/ f/u in 5wks -- pt prefers aggressive maintenance given functional monocular stutus  - pt wishes to be treated with IVE OS  - RBA of procedure discussed, questions answered  - informed consent obtained and signed  - see procedure note  - Eylea4U benefits investigation started 9.14.20 -- approved for 2021  - f/u in 5 wks -- DFE, OCT, likely injection  4,5. Hypertensive retinopathy OU  - discussed importance of tight BP control  - monitor  6. Pseudophakia OU  - s/p CE/IOL OU  - monitor   Ophthalmic Meds Ordered this visit:  Meds ordered this encounter  Medications  . aflibercept (EYLEA) SOLN 2 mg       Return in about 5 weeks (around 10/23/2019) for f/u exu ARMD OU, DFE, OCT.  There are no Patient Instructions on file for this visit.   Explained the diagnoses, plan, and follow up with the patient and they expressed understanding.  Patient expressed understanding of the importance of proper follow up care.   This document serves as a record of services personally performed by Gardiner Sleeper, MD, PhD. It was created on their behalf by Leeann Must, [place], a certified ophthalmic assistant. The creation of this record is the provider's dictation and/or activities during the visit.    Electronically signed by: Leeann Must, COA @TODAY @ 11:37 AM  This document serves as a record of services personally performed by Gardiner Sleeper, MD, PhD. It was created on their behalf by Ernest Mallick, OA, an ophthalmic assistant. The creation of this record is the provider's dictation and/or activities during the visit.    Electronically signed by: Ernest Mallick, OA  01.11.2021 11:37 AM   Gardiner Sleeper, M.D., Ph.D. Diseases & Surgery of the Retina and Vitreous [facility]  I have reviewed the above documentation for accuracy and completeness, and I agree with the above. Gardiner Sleeper, M.D., Ph.D. 09/18/19 11:37 AM   Abbreviations: M myopia (nearsighted); A astigmatism; H hyperopia (farsighted); P presbyopia; Mrx spectacle prescription;  CTL contact lenses; OD right eye; OS left eye; OU both eyes  XT exotropia; ET esotropia; PEK punctate epithelial keratitis; PEE punctate epithelial erosions; DES dry eye syndrome; MGD meibomian gland dysfunction; ATs artificial tears; PFAT's preservative free artificial tears; [place] nuclear sclerotic cataract; PSC posterior subcapsular cataract; ERM epi-retinal membrane; PVD posterior vitreous detachment; RD retinal detachment; DM diabetes mellitus; DR diabetic retinopathy; NPDR non-proliferative diabetic retinopathy; PDR proliferative diabetic retinopathy; CSME clinically significant macular edema; DME diabetic macular edema; dbh dot blot hemorrhages; CWS cotton wool spot; POAG primary open angle glaucoma; C/D cup-to-disc ratio; HVF humphrey visual field; GVF goldmann visual field; OCT optical coherence tomography; IOP intraocular pressure; BRVO Branch retinal vein occlusion; CRVO central retinal vein occlusion; CRAO central retinal artery occlusion; BRAO branch retinal artery occlusion; RT retinal tear; SB scleral buckle; PPV pars plana vitrectomy; VH Vitreous hemorrhage; PRP panretinal laser photocoagulation; IVK intravitreal kenalog; VMT vitreomacular traction; MH  Macular hole;  NVD neovascularization of the disc; NVE neovascularization elsewhere; AREDS age related eye disease study; ARMD age related macular degeneration; POAG primary open angle glaucoma; EBMD epithelial/anterior basement membrane dystrophy; ACIOL anterior chamber intraocular lens; IOL intraocular lens; PCIOL posterior chamber intraocular  lens; Phaco/IOL phacoemulsification with intraocular lens placement; [place] photorefractive keratectomy; LASIK laser assisted in situ keratomileusis; HTN hypertension; DM diabetes mellitus; COPD chronic obstructive pulmonary disease

## 2019-09-15 ENCOUNTER — Encounter: Payer: Self-pay | Admitting: Podiatry

## 2019-09-15 ENCOUNTER — Ambulatory Visit (INDEPENDENT_AMBULATORY_CARE_PROVIDER_SITE_OTHER): Payer: Medicare Other | Admitting: Podiatry

## 2019-09-15 ENCOUNTER — Other Ambulatory Visit: Payer: Self-pay

## 2019-09-15 DIAGNOSIS — M79674 Pain in right toe(s): Secondary | ICD-10-CM

## 2019-09-15 DIAGNOSIS — B351 Tinea unguium: Secondary | ICD-10-CM

## 2019-09-15 DIAGNOSIS — M79675 Pain in left toe(s): Secondary | ICD-10-CM

## 2019-09-15 DIAGNOSIS — I739 Peripheral vascular disease, unspecified: Secondary | ICD-10-CM

## 2019-09-15 NOTE — Patient Instructions (Signed)

## 2019-09-15 NOTE — Progress Notes (Signed)
Subjective: Steven Williams is a 86 y.o. y.o. male who is on long term blood thinner, Coumadin, presents today with painful, discolored, thick toenails  which interfere with daily activities. Pain is aggravated when wearing enclosed shoe gear. Pain is relieved with periodic professional debridement.  Medications reviewed in chart.  Allergies  Allergen Reactions  . Iodinated Diagnostic Agents Rash     Objective: There were no vitals filed for this visit.  Vascular Examination: Capillary refill time to digits <4 seconds b/l.  Dorsalis pedis pulses nonpalpable b/l.  Posterior tibial pulses nonpalpable b/l.  Digital hair absent b/l.  Skin temperature gradient warm to cool b/l.  Dermatological Examination: Pedal skin is thin, shiny and atrophic b/l.  Toenails 1-5 b/l discolored, thick, dystrophic with subungual debris and pain with palpation to nailbeds due to thickness of nails.  No open wounds.    No interdigital macerations.  Callus submet head 1 right foot resolved.  Musculoskeletal: Muscle strength 5/5 to all LE muscle groups.  No gross bony deformities b/l.   No pain, crepitus or joint discomfort with active/passive ROM.  Neurological: Sensation intact 5/5 b/l with 10 gram monofilament.  Vibratory sensation intact b/l.  Assessment: Painful onychomycosis toenails 1-5 b/l in patient on blood thinner.   Plan: 1. Toenails 1-5 b/l were debrided in length and girth without incident. 2. Patient to continue soft, supportive shoe gear daily. 3. Patient to report any pedal injuries to medical professional immediately. 4. Avoid self trimming due to use of blood thinner. 5. Follow up 3 months.  6. Patient/POA to call should there be a concern in the interim.

## 2019-09-18 ENCOUNTER — Ambulatory Visit (INDEPENDENT_AMBULATORY_CARE_PROVIDER_SITE_OTHER): Payer: Medicare Other | Admitting: Ophthalmology

## 2019-09-18 ENCOUNTER — Encounter (INDEPENDENT_AMBULATORY_CARE_PROVIDER_SITE_OTHER): Payer: Self-pay | Admitting: Ophthalmology

## 2019-09-18 ENCOUNTER — Other Ambulatory Visit: Payer: Self-pay | Admitting: Thoracic Surgery (Cardiothoracic Vascular Surgery)

## 2019-09-18 DIAGNOSIS — I10 Essential (primary) hypertension: Secondary | ICD-10-CM | POA: Diagnosis not present

## 2019-09-18 DIAGNOSIS — I712 Thoracic aortic aneurysm, without rupture, unspecified: Secondary | ICD-10-CM

## 2019-09-18 DIAGNOSIS — H3581 Retinal edema: Secondary | ICD-10-CM | POA: Diagnosis not present

## 2019-09-18 DIAGNOSIS — Z961 Presence of intraocular lens: Secondary | ICD-10-CM

## 2019-09-18 DIAGNOSIS — H353221 Exudative age-related macular degeneration, left eye, with active choroidal neovascularization: Secondary | ICD-10-CM

## 2019-09-18 DIAGNOSIS — H35033 Hypertensive retinopathy, bilateral: Secondary | ICD-10-CM

## 2019-09-18 DIAGNOSIS — H353213 Exudative age-related macular degeneration, right eye, with inactive scar: Secondary | ICD-10-CM | POA: Diagnosis not present

## 2019-09-18 MED ORDER — AFLIBERCEPT 2MG/0.05ML IZ SOLN FOR KALEIDOSCOPE
2.0000 mg | INTRAVITREAL | Status: AC | PRN
  Administered 2019-09-18: 2 mg via INTRAVITREAL

## 2019-09-25 ENCOUNTER — Ambulatory Visit (INDEPENDENT_AMBULATORY_CARE_PROVIDER_SITE_OTHER): Payer: Medicare Other | Admitting: *Deleted

## 2019-09-25 DIAGNOSIS — Z950 Presence of cardiac pacemaker: Secondary | ICD-10-CM | POA: Diagnosis not present

## 2019-09-25 LAB — CUP PACEART REMOTE DEVICE CHECK
Battery Impedance: 1896 Ohm
Battery Remaining Longevity: 39 mo
Battery Voltage: 2.75 V
Brady Statistic RV Percent Paced: 13 %
Date Time Interrogation Session: 20210118153203
Implantable Lead Implant Date: 20041008
Implantable Lead Implant Date: 20041008
Implantable Lead Location: 753859
Implantable Lead Location: 753860
Implantable Lead Model: 5076
Implantable Lead Model: 5076
Implantable Pulse Generator Implant Date: 20130328
Lead Channel Impedance Value: 67 Ohm
Lead Channel Impedance Value: 811 Ohm
Lead Channel Setting Pacing Amplitude: 2.5 V
Lead Channel Setting Pacing Pulse Width: 1 ms
Lead Channel Setting Sensing Sensitivity: 2.8 mV

## 2019-09-26 NOTE — Progress Notes (Signed)
PPM remote 

## 2019-10-04 ENCOUNTER — Encounter: Payer: Self-pay | Admitting: *Deleted

## 2019-10-04 ENCOUNTER — Other Ambulatory Visit: Payer: Self-pay | Admitting: *Deleted

## 2019-10-04 DIAGNOSIS — Z91041 Radiographic dye allergy status: Secondary | ICD-10-CM

## 2019-10-04 MED ORDER — PREDNISONE 50 MG PO TABS: ORAL_TABLET | ORAL | 0 refills | Status: AC

## 2019-10-04 MED ORDER — DIPHENHYDRAMINE HCL 50 MG PO TABS: 50.0000 mg | ORAL_TABLET | Freq: Once | ORAL | 0 refills | Status: DC

## 2019-10-09 ENCOUNTER — Telehealth: Payer: Self-pay

## 2019-10-09 ENCOUNTER — Other Ambulatory Visit: Payer: Self-pay | Admitting: Thoracic Surgery (Cardiothoracic Vascular Surgery)

## 2019-10-09 DIAGNOSIS — I712 Thoracic aortic aneurysm, without rupture, unspecified: Secondary | ICD-10-CM

## 2019-10-09 NOTE — Telephone Encounter (Signed)
Spoke with patient to let him know I've phoned in his 13 hour prep to [facility] in [place].  He was informed he needs to take Prednisone 50mg  PO 10/12/19 @ 0001, 0600 and 1200; Benadryl 50mg  PO 10/12/19 @ 1200.

## 2019-10-10 NOTE — Progress Notes (Signed)
[facility] Note  10/23/2019     CHIEF COMPLAINT Patient presents for Retina Follow Up   HISTORY OF PRESENT ILLNESS: Steven Williams is a 86 y.o. male who presents to the clinic today for:   HPI    Retina Follow Up    Patient presents with  Wet AMD.  In left eye.  This started weeks ago.  Severity is moderate.  Duration of weeks.  Since onset it is stable.  I, the attending physician,  performed the HPI with the patient and updated documentation appropriately.          Comments    Pt states his vision is about the same.  Denies eye pain or discomfort.  Denies any new or worsening floaters or fol OU.       Last edited by [name], MD on 10/23/2019  1:41 PM. (History)    pt states   Referring physician:   HISTORICAL INFORMATION:   Selected notes from the [facility] Referred by [name], [place] LEE:  Ocular Hx- PMH-    CURRENT MEDICATIONS: No current outpatient medications on file. (Ophthalmic Drugs)   No current facility-administered medications for this visit. (Ophthalmic Drugs)   Current Outpatient Medications (Other)  Medication Sig  . allopurinol (ZYLOPRIM) 100 MG tablet TK 1 T PO D  . carvedilol (COREG) 6.25 MG tablet Take 6.25 mg by mouth 2 (two) times daily.  . Cholecalciferol (VITAMIN D3) 50 MCG (2000 UT) TABS Take 1 tablet by mouth 2 (two) times daily.  Marland Kitchen dicyclomine (BENTYL) 10 MG capsule Take 10 mg by mouth 4 (four) times daily.  . digoxin (LANOXIN) 0.125 MG tablet Take 0.125 mg by mouth as directed. Patient takes medication on M/W/F  . Ferrous Sulfate (IRON) 325 (65 Fe) MG TABS Take 1 tablet by mouth 2 (two) times daily.  . furosemide (LASIX) 40 MG tablet Take 40 mg by mouth daily.  Marland Kitchen gabapentin (NEURONTIN) 400 MG capsule Take 400 mg by mouth at bedtime.  Marland Kitchen levothyroxine (SYNTHROID, LEVOTHROID) 88 MCG tablet Take 88 mcg by mouth daily before breakfast.  . Misc Natural Products (GLUCOSAMINE CHOND  COMPLEX/MSM PO) Take 1 tablet by mouth daily.  . Multiple Vitamins-Minerals (PRESERVISION AREDS PO) Take 1 tablet by mouth 2 (two) times daily.  . NON FORMULARY Antifungal nail cream from [facility] - faxed 03/27/2019  [facility]  . rOPINIRole (REQUIP) 0.5 MG tablet   . rosuvastatin (CRESTOR) 10 MG tablet Take 10 mg by mouth daily.  . saw palmetto 500 MG capsule Take 500 mg by mouth 2 (two) times daily.  . tamsulosin (FLOMAX) 0.4 MG CAPS capsule Take 0.4 mg by mouth daily.  . temazepam (RESTORIL) 15 MG capsule Take 15 mg by mouth at bedtime as needed for sleep.  Marland Kitchen warfarin (COUMADIN) 2 MG tablet Take 2 mg by mouth daily.   Current Facility-Administered Medications (Other)  Medication Route  . Bevacizumab (AVASTIN) SOLN 1.25 mg Intravitreal      REVIEW OF SYSTEMS: ROS    Positive for: Endocrine, Eyes   Negative for: Constitutional, Gastrointestinal, Neurological, Skin, Genitourinary, Musculoskeletal, HENT, Cardiovascular, Respiratory, Psychiatric, Allergic/Imm, Heme/Lymph   Last edited by Doneen Poisson on 10/23/2019  1:35 PM. (History)       ALLERGIES Allergies  Allergen Reactions  . Iodinated Diagnostic Agents Rash    PAST MEDICAL HISTORY Past Medical History:  Diagnosis Date  . Antiphospholipid antibody positive   . Aortic aneurysm ([place])   .  Aortic dissection ([place])   . Central retinal artery occlusion   . Chronic anemia   . Chronic renal insufficiency   . COPD (chronic obstructive pulmonary disease) ([place])   . DDD (degenerative disc disease), cervical   . DJD (degenerative joint disease)   . DVT (deep venous thrombosis) ([place])   . Gastric dysmotility   . GERD (gastroesophageal reflux disease)   . Gout   . HTN (hypertension)   . Hyperlipidemia   . Myelodysplasia (myelodysplastic syndrome) ([place])   . Paget's disease of bone   . Permanent atrial fibrillation ([place])   . Rheumatoid arthritis ([place])   . Symptomatic bradycardia   . TIA (transient ischemic attack)     Past Surgical History:  Procedure Laterality Date  . ATRIAL FIBRILLATION ABLATION  09/23/2014   Dr Encarnacion Chu in [place]  . BACK SURGERY    . CAROTID ENDARTERECTOMY    . CAROTID STENT    . CATARACT EXTRACTION Bilateral 2010   [place]  . EYE SURGERY    . LUNG DECORTICATION     [facility]  . PACEMAKER GENERATOR CHANGE  12/03/2011   MDT Adapta [id number] generator change by Dr Encarnacion Chu for symptomatic bradycardia  . PACEMAKER IMPLANT  2004   MDT     FAMILY HISTORY Family History  Family history unknown: Yes    SOCIAL HISTORY Social History   Tobacco Use  . Smoking status: Former Smoker    Types: Cigarettes    Quit date: 1961    Years since quitting: 60.1  . Smokeless tobacco: Never Used  Substance Use Topics  . Alcohol use: Yes    Alcohol/week: 2.0 standard drinks    Types: 2 Glasses of wine per week    Comment: daily  . Drug use: Never         OPHTHALMIC EXAM:  Base Eye Exam    Visual Acuity (Snellen - Linear)      Right Left   Dist cc CF @ 5' 20/50 -1   Dist ph cc NI 20/40 -1   Correction: Glasses       Tonometry (Tonopen, 1:39 PM)      Right Left   Pressure 14 14       Pupils      Dark Light Shape React APD   Right 2 1 Round Minimal 0   Left 2 1 Round Minimal 0       Visual Fields      Left Right    Full Full       Extraocular Movement      Right Left    Full Full       Neuro/Psych    Oriented x3: Yes   Mood/Affect: Normal       Dilation    Both eyes: 1.0% Mydriacyl, 2.5% Phenylephrine @ 1:39 PM        Slit Lamp and Fundus Exam    Slit Lamp Exam      Right Left   Lids/Lashes Dermatochalasis - upper lid, Meibomian gland dysfunction Dermatochalasis - upper lid, Meibomian gland dysfunction   Conjunctiva/Sclera White and quiet White and quiet   Cornea Arcus, 2-3+ Punctate epithelial erosions, Well healed cataract wounds Arcus, 3-4+ Punctate epithelial erosions centrally, Well healed cataract wounds   Anterior Chamber Deep and quiet Deep and  quiet   Iris Round and poorly dilated Round and poorly dilated to 4.5   Lens Posterior chamber intraocular lens, trace Posterior capsular opacification Posterior chamber intraocular lens, trace Posterior capsular opacification  Vitreous Vitreous syneresis, Posterior vitreous detachment Vitreous syneresis       Fundus Exam      Right Left   Disc +cupping, trace pallor, sharp rim, 360 PPA 360 PPA, +cupping   C/D Ratio 0.7 0.7   Macula Flat, diffuse RPE atrophy, central pigment clumping, No heme or edema Flat, Blunted foveal reflex, Drusen, RPE mottling, clumping and atrophy, No heme or edema, ?trace cystic changes   Vessels Vascular attenuation Vascular attenuation, Tortuous   Periphery Attached, mild reticular degeneration, mild paving stone inferiorly Attached, mild reticular degeneration        Refraction    Wearing Rx      Sphere Cylinder Axis Add   Right -1.75 +2.25 005 +2.50   Left -1.50 +2.50 175 +2.50   Type: PAL          IMAGING AND PROCEDURES  Imaging and Procedures for @TODAY @  OCT, Retina - OU - Both Eyes       Right Eye Quality was good. Central Foveal Thickness: 258. Progression has been stable. Findings include abnormal foveal contour, no IRF, no SRF, subretinal hyper-reflective material, retinal drusen , outer retinal atrophy, pigment epithelial detachment.   Left Eye Quality was good. Central Foveal Thickness: 266. Progression has been stable. Findings include abnormal foveal contour, no SRF, outer retinal atrophy, retinal drusen , pigment epithelial detachment, subretinal hyper-reflective material, no IRF (Mild persistent cystic changes ).   Notes *Images captured and stored on drive  Diagnosis / Impression:  Exudative ARMD OU OD: +atrophic macular scar OS: Mild persistent cystic changes  Clinical management:  See below  Abbreviations: NFP - Normal foveal profile. CME - cystoid macular edema. PED - pigment epithelial detachment. IRF - intraretinal  fluid. SRF - subretinal fluid. EZ - ellipsoid zone. ERM - epiretinal membrane. ORA - outer retinal atrophy. ORT - outer retinal tubulation. SRHM - subretinal hyper-reflective material        Intravitreal Injection, Pharmacologic Agent - OS - Left Eye       Time Out 10/23/2019. 1:41 PM. Confirmed correct patient, procedure, site, and patient consented.   Anesthesia Topical anesthesia was used. Anesthetic medications included Lidocaine 2%, Proparacaine 0.5%.   Procedure Preparation included 5% betadine to ocular surface, eyelid speculum. A supplied (32 g) needle was used.   Injection:  2 mg aflibercept Alfonse Flavors) SOLN   NDC: A3590391, Lot: [id number], Expiration date: 03/07/2020   Route: Intravitreal, Site: Left Eye, Waste: 0.05 mL  Post-op Post injection exam found visual acuity of at least counting fingers. The patient tolerated the procedure well. There were no complications. The patient received written and verbal post procedure care education.                 ASSESSMENT/PLAN:    ICD-10-CM   1. Exudative age-related macular degeneration of left eye with active choroidal neovascularization (HCC)  H35.3221 Intravitreal Injection, Pharmacologic Agent - OS - Left Eye    aflibercept (EYLEA) SOLN 2 mg  2. Exudative age-related macular degeneration of right eye with inactive scar ([place])  H35.3213   3. Retinal edema  H35.81 OCT, Retina - OU - Both Eyes  4. Pseudophakia of both eyes  Z96.1   5. Hypertensive retinopathy of both eyes  H35.033   6. Essential hypertension  I10     1-3. 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    OD- w/ inactive scar -- history of low vision, CF 3'  OS- w/ CNV   - history of multiple IVL OS  in [place], [place] w/ Dr. Doristine Church -- last injection 06/2018, q4-6 wk interval  - moved to [place] in November 2019 and has been followed by Dr. Baird Cancer who had recommended observation, thus pt sought second opinion  - given functional monocular  status and history of active CNV, has been receiving maintenance injections, s/p IVA #1 OS (02.25.20), #2 (04.07.20), #3 (05.05.20), #4 (06.22.20), #5 (08.03.20)  - s/p IVE OS #1 (09.14.20), #2 (10.23.20), #3 (11.30.20), #4 (01.11.21)  - OCT shows PEDs, CNV w/ tr cystic changes OS; OD with inactive scar  - BCVA 20/40 -- stable from prior  - recommend IVE #5 OS today, 02.15.21, w/ f/u in 5wks -- pt prefers aggressive maintenance given functional monocular stutus  - pt wishes to be treated with IVE OS  - RBA of procedure discussed, questions answered  - informed consent obtained   - Eylea informed consent form signed and scanned on 01.11.2021  - see procedure note  - Eylea4U benefits investigation started 9.14.20 -- approved for 2021  - f/u in 5 wks -- DFE, OCT, likely injection  4,5. Hypertensive retinopathy OU  - discussed importance of tight BP control  - monitor  6. Pseudophakia OU  - s/p CE/IOL OU  - monitor   Ophthalmic Meds Ordered this visit:  Meds ordered this encounter  Medications  . aflibercept (EYLEA) SOLN 2 mg       Return in about 5 weeks (around 11/27/2019) for f/u exu ARMD OU, Dilated Exam, OCT, Possible Injxn.  There are no Patient Instructions on file for this visit.   Explained the diagnoses, plan, and follow up with the patient and they expressed understanding.  Patient expressed understanding of the importance of proper follow up care.   This document serves as a record of services personally performed by Gardiner Sleeper, MD, PhD. It was created on their behalf by Leeann Must, [place], a certified ophthalmic assistant. The creation of this record is the provider's dictation and/or activities during the visit.    Electronically signed by: Leeann Must, COA @TODAY @ 2:05 PM   This document serves as a record of services personally performed by Gardiner Sleeper, MD, PhD. It was created on their behalf by Ernest Mallick, OA, an ophthalmic assistant. The creation  of this record is the provider's dictation and/or activities during the visit.    Electronically signed by: Ernest Mallick, OA 02.15.2021 2:05 PM   Gardiner Sleeper, M.D., Ph.D. Diseases & Surgery of the Retina and Vitreous [facility]  I have reviewed the above documentation for accuracy and completeness, and I agree with the above. Gardiner Sleeper, M.D., Ph.D. 10/23/19 2:05 PM    Abbreviations: M myopia (nearsighted); A astigmatism; H hyperopia (farsighted); P presbyopia; Mrx spectacle prescription;  CTL contact lenses; OD right eye; OS left eye; OU both eyes  XT exotropia; ET esotropia; PEK punctate epithelial keratitis; PEE punctate epithelial erosions; DES dry eye syndrome; MGD meibomian gland dysfunction; ATs artificial tears; PFAT's preservative free artificial tears; [place] nuclear sclerotic cataract; PSC posterior subcapsular cataract; ERM epi-retinal membrane; PVD posterior vitreous detachment; RD retinal detachment; DM diabetes mellitus; DR diabetic retinopathy; NPDR non-proliferative diabetic retinopathy; PDR proliferative diabetic retinopathy; CSME clinically significant macular edema; DME diabetic macular edema; dbh dot blot hemorrhages; CWS cotton wool spot; POAG primary open angle glaucoma; C/D cup-to-disc ratio; HVF humphrey visual field; GVF goldmann visual field; OCT optical coherence tomography; IOP intraocular pressure; BRVO Branch retinal vein occlusion; CRVO central retinal vein  occlusion; CRAO central retinal artery occlusion; BRAO branch retinal artery occlusion; RT retinal tear; SB scleral buckle; PPV pars plana vitrectomy; VH Vitreous hemorrhage; PRP panretinal laser photocoagulation; IVK intravitreal kenalog; VMT vitreomacular traction; MH Macular hole;  NVD neovascularization of the disc; NVE neovascularization elsewhere; AREDS age related eye disease study; ARMD age related macular degeneration; POAG primary open angle glaucoma; EBMD epithelial/anterior  basement membrane dystrophy; ACIOL anterior chamber intraocular lens; IOL intraocular lens; PCIOL posterior chamber intraocular lens; Phaco/IOL phacoemulsification with intraocular lens placement; [place] photorefractive keratectomy; LASIK laser assisted in situ keratomileusis; HTN hypertension; DM diabetes mellitus; COPD chronic obstructive pulmonary disease

## 2019-10-12 ENCOUNTER — Other Ambulatory Visit: Payer: Self-pay

## 2019-10-12 ENCOUNTER — Ambulatory Visit
Admission: RE | Admit: 2019-10-12 | Discharge: 2019-10-12 | Disposition: A | Discharge status: Home / Self Care | Payer: Medicare Other | Source: Ambulatory Visit | Attending: Thoracic Surgery (Cardiothoracic Vascular Surgery) | Admitting: Thoracic Surgery (Cardiothoracic Vascular Surgery)

## 2019-10-12 DIAGNOSIS — I712 Thoracic aortic aneurysm, without rupture, unspecified: Secondary | ICD-10-CM

## 2019-10-12 MED ORDER — IOPAMIDOL (ISOVUE-370) INJECTION 76%
60.0000 mL | Freq: Once | INTRAVENOUS | Status: AC | PRN
  Administered 2019-10-12: 13:00:00 60 mL via INTRAVENOUS

## 2019-10-17 ENCOUNTER — Ambulatory Visit (INDEPENDENT_AMBULATORY_CARE_PROVIDER_SITE_OTHER): Payer: Medicare Other | Admitting: Thoracic Surgery (Cardiothoracic Vascular Surgery)

## 2019-10-17 ENCOUNTER — Other Ambulatory Visit: Payer: Self-pay

## 2019-10-17 ENCOUNTER — Other Ambulatory Visit: Payer: Medicare Other

## 2019-10-17 ENCOUNTER — Encounter: Payer: Self-pay | Admitting: Thoracic Surgery (Cardiothoracic Vascular Surgery)

## 2019-10-17 VITALS — BP 137/73 | HR 70 | Temp 98.1°F | Resp 16 | Ht 73.0 in | Wt 180.5 lb

## 2019-10-17 DIAGNOSIS — I7121 Aneurysm of the ascending aorta, without rupture: Secondary | ICD-10-CM

## 2019-10-17 DIAGNOSIS — I712 Thoracic aortic aneurysm, without rupture: Secondary | ICD-10-CM | POA: Diagnosis not present

## 2019-10-17 DIAGNOSIS — Z8679 Personal history of other diseases of the circulatory system: Secondary | ICD-10-CM

## 2019-10-17 NOTE — Progress Notes (Signed)
[address]       [place] [address]             [phone]    HPI: Mr. Pennisi returns for a scheduled 14-month follow-up visit  Kane Kusek is an 86 year old man with a history of hypertension, hyperlipidemia, "aortic dissection," descending thoracic aneurysm, aortic atherosclerosis, heart murmur, bradycardia, pacemaker, permanent atrial fibrillation, DVT, Paget's disease, rheumatoid arthritis, TIA, COPD, and chronic kidney disease.  He moved to [place] from [place], [place] in 2019.  He had had an aortic dissection in 2011.  He was being scanned annually for a 4.8 cm descending aneurysm.  I have been following him since he moved to [place].  In the interim since his last visit he has been worried about the aneurysm.  He is not having any chest or back pain.  He occasionally has some lower abdominal pain when he is trying to have a bowel movement but no abdominal pain associated with eating. Past Medical History:  Diagnosis Date  . Antiphospholipid antibody positive   . Aortic aneurysm ([place])   . Aortic dissection ([place])   . Central retinal artery occlusion   . Chronic anemia   . Chronic renal insufficiency   . COPD (chronic obstructive pulmonary disease) ([place])   . DDD (degenerative disc disease), cervical   . DJD (degenerative joint disease)   . DVT (deep venous thrombosis) ([place])   . Gastric dysmotility   . GERD (gastroesophageal reflux disease)   . Gout   . HTN (hypertension)   . Hyperlipidemia   . Myelodysplasia (myelodysplastic syndrome) ([place])   . Paget's disease of bone   . Permanent atrial fibrillation ([place])   . Rheumatoid arthritis ([place])   . Symptomatic bradycardia   . TIA (transient ischemic attack)     Current Outpatient Medications  Medication Sig Dispense Refill  . allopurinol (ZYLOPRIM) 100 MG tablet TK 1 T PO D    . carvedilol (COREG) 6.25 MG tablet Take 6.25 mg by mouth 2 (two) times daily.    . Cholecalciferol (VITAMIN D3)  50 MCG (2000 UT) TABS Take 1 tablet by mouth 2 (two) times daily.    Marland Kitchen dicyclomine (BENTYL) 10 MG capsule Take 10 mg by mouth 4 (four) times daily.    . digoxin (LANOXIN) 0.125 MG tablet Take 0.125 mg by mouth as directed. Patient takes medication on M/W/F    . Ferrous Sulfate (IRON) 325 (65 Fe) MG TABS Take 1 tablet by mouth 2 (two) times daily.    . furosemide (LASIX) 40 MG tablet Take 40 mg by mouth daily.    Marland Kitchen gabapentin (NEURONTIN) 400 MG capsule Take 400 mg by mouth at bedtime.    Marland Kitchen levothyroxine (SYNTHROID, LEVOTHROID) 88 MCG tablet Take 88 mcg by mouth daily before breakfast.    . Misc Natural Products (GLUCOSAMINE CHOND COMPLEX/MSM PO) Take 1 tablet by mouth daily.    . Multiple Vitamins-Minerals (PRESERVISION AREDS PO) Take 1 tablet by mouth 2 (two) times daily.    . NON FORMULARY Antifungal nail cream from [facility] - faxed 03/27/2019  [facility]    . rOPINIRole (REQUIP) 0.5 MG tablet     . rosuvastatin (CRESTOR) 10 MG tablet Take 10 mg by mouth daily.    . saw palmetto 500 MG capsule Take 500 mg by mouth 2 (two) times daily.    . tamsulosin (FLOMAX) 0.4 MG CAPS capsule Take 0.4 mg by mouth daily.    . temazepam (RESTORIL) 15  MG capsule Take 15 mg by mouth at bedtime as needed for sleep.    Marland Kitchen warfarin (COUMADIN) 2 MG tablet Take 2 mg by mouth daily.     Current Facility-Administered Medications  Medication Dose Route Frequency Provider Last Rate Last Admin  . Bevacizumab (AVASTIN) SOLN 1.25 mg  1.25 mg Intravitreal  [name], MD   1.25 mg at 11/01/18 2343    Physical Exam BP 137/73 (BP Location: Right Arm, Patient Position: Sitting, Cuff Size: Normal)   Pulse 70   Temp 98.1 F (36.7 C)   Resp 16   Ht 6\' 1"  (1.854 m)   Wt 180 lb 8 oz (81.9 kg)   SpO2 100% Comment: RA  BMI 23.52 kg/m  86 year old man in no acute distress Alert and oriented x3 with no focal neurologic deficit Cardiac regular rate and rhythm with a 2/6 systolic murmur Lungs clear equal breath  sounds No peripheral edema  Diagnostic Tests: CT ANGIOGRAPHY CHEST WITH CONTRAST  TECHNIQUE: Multidetector CT imaging of the chest was performed using the standard protocol during bolus administration of intravenous contrast. Multiplanar CT image reconstructions and MIPs were obtained to evaluate the vascular anatomy.  CONTRAST:  16mL ISOVUE-370 IOPAMIDOL (ISOVUE-370) INJECTION 76%  COMPARISON:  04/06/2019  FINDINGS: Cardiovascular:  Heart:  Redemonstration of cardiomegaly. No pericardial fluid/thickening. Pacing device on the left chest wall with leads terminating in the right atrium and the right ventricle. Calcifications of left main, left anterior descending, circumflex, right coronary arteries.  Aorta:  Calcifications of the aortic valve. Ascending aorta measures 3.8 cm. Type 3 arch with 3 vessel arch. Mild atherosclerosis at the origin of the branch vessels.  Proximal right vertebral artery appears occluded.  The proximal left vertebral artery is patent with atherosclerotic changes at the vertebral artery origin.  Diameter of the distal aortic arch 4.1 cm.  Redemonstration of ulcerated plaque/penetrating ulcer of the descending thoracic aorta. The overall configuration is unchanged from the comparison CT. Depth of penetration from the native wall 2 the apex of the ulcer measures 15 mm, unchanged. Diameter of the aorta from the contralateral wall to the apex of the ulcer 4.6 cm, which is relatively unchanged from prior. No periaortic inflammatory changes or fluid at this site.  Greatest diameter of the thoracic aorta at the hiatus, 3.9 cm.  No dissection flap.  Note the incidentally imaged aorta in the upper abdomen demonstrates atherosclerotic changes including what is at least 50% narrowing of the SMA origin.  Pulmonary arteries:  Contrast bolus not optimized for evaluation of the pulmonary arteries. Main pulmonary diameter 3.5 cm  which is essentially unchanged from the comparison.  Mediastinum/Nodes: Unremarkable appearance of the thoracic inlet. Small lymph nodes of the mediastinum. Unremarkable appearance of the course of the thoracic esophagus.  Lungs/Pleura: Paraseptal and centrilobular emphysema. No pneumothorax. No pleural effusion. Bilateral bronchiectasis.  No confluent airspace disease. Linear opacities the bilateral lung bases compatible with atelectasis/scarring. Calcified granuloma at the left lung base. Linear scarring at the right posterior lung base, likely rounded atelectasis/scarring.  Upper Abdomen: No acute finding of the upper abdomen. Low-density cystic lesion of the both the left and right liver, most likely benign biliary cyst.  Musculoskeletal: No acute displaced fracture. Multilevel degenerative changes of the visualized spine. No bony canal narrowing.  Review of the MIP images confirms the above findings.  IMPRESSION: Aortic aneurysmal and atherosclerotic disease, similar to the comparison CT of 04/06/2019, with relatively unchanged configuration of the descending aortic penetrating ulcer and the associated aneurysm,  which is estimated 4.6 cm on the current CT, as above. Aortic Atherosclerosis (ICD10-I70.0). Aortic aneurysm NOS (ICD10-I71.9).  Greatest diameter of ascending aorta measures 3.8 cm.  Atherosclerosis of the branch vessels including occlusion of the proximal right vertebral artery as well as at least 50% narrowing at the origin of the left vertebral artery. If basilar insufficiency is a concern, further evaluation with carotid duplex imaging may be considered.  Cardiomegaly and coronary artery disease.  Incidental imaging of abdominal atherosclerosis and mesenteric arterial disease.  Emphysema (ICD10-J43.9).  Signed,  Dulcy Fanny. Dellia Nims, RPVI  [facility]  [facility]    Electronically Signed   By: Corrie Mckusick D.O.   On: 10/12/2019 13:55 I personally reviewed the CT images and concur with the findings noted above  Impression: Edrei Norgaard is an 86 year old man with a history of hypertension, hyperlipidemia, thoracic aortic atherosclerosis, descending thoracic aneurysm secondary to limited dissection versus ruptured ulcerated plaque, heart murmur, bradycardia, atrial fibrillation, and permanent pacemaker.  Descending thoracic aneurysm-he had questions about whether this was a dissection or intramural hematoma secondary to an ulcerated plaque.  I think it is most likely an intramural hematoma, but it could have been a very limited aortic dissection.  In any event it would be treated exactly the same way.  This has been stable and there is no indication for intervention at this time.    Hypertension-he is on a good medical regimen.  Blood pressure is borderline elevated today.  He says that when he checks himself at home its usually around 100-110 and sometimes as low as 95 systolic.  I suspect there is some degree of anxiety about his scan results, as he seemed very anxious about them before we discussed the results.  He does have some atherosclerotic disease in the celiac axis and SMA.  He is not having any abdominal pain associated with eating.  Plan: Return in 6 months with CT angiogram of chest  Melrose Nakayama, MD [facility] [phone]

## 2019-10-23 ENCOUNTER — Ambulatory Visit (INDEPENDENT_AMBULATORY_CARE_PROVIDER_SITE_OTHER): Payer: Medicare Other | Admitting: Ophthalmology

## 2019-10-23 ENCOUNTER — Encounter (INDEPENDENT_AMBULATORY_CARE_PROVIDER_SITE_OTHER): Payer: Self-pay | Admitting: Ophthalmology

## 2019-10-23 DIAGNOSIS — H353213 Exudative age-related macular degeneration, right eye, with inactive scar: Secondary | ICD-10-CM | POA: Diagnosis not present

## 2019-10-23 DIAGNOSIS — H3581 Retinal edema: Secondary | ICD-10-CM | POA: Diagnosis not present

## 2019-10-23 DIAGNOSIS — Z961 Presence of intraocular lens: Secondary | ICD-10-CM

## 2019-10-23 DIAGNOSIS — H35033 Hypertensive retinopathy, bilateral: Secondary | ICD-10-CM

## 2019-10-23 DIAGNOSIS — H353221 Exudative age-related macular degeneration, left eye, with active choroidal neovascularization: Secondary | ICD-10-CM

## 2019-10-23 DIAGNOSIS — I10 Essential (primary) hypertension: Secondary | ICD-10-CM

## 2019-10-23 MED ORDER — AFLIBERCEPT 2MG/0.05ML IZ SOLN FOR KALEIDOSCOPE
2.0000 mg | INTRAVITREAL | Status: AC | PRN
  Administered 2019-10-23: 14:00:00 2 mg via INTRAVITREAL

## 2019-11-24 NOTE — Progress Notes (Signed)
[facility] Note  11/27/2019     CHIEF COMPLAINT Patient presents for Retina Follow Up   HISTORY OF PRESENT ILLNESS: Steven Williams is a 86 y.o. male who presents to the clinic today for:   HPI    Retina Follow Up    Patient presents with  Wet AMD.  In both eyes.  This started 5 weeks ago.  Severity is moderate.  I, the attending physician,  performed the HPI with the patient and updated documentation appropriately.          Comments    Patient here for 5 weeks retina follow up for exu ARMD OU. Patient states vision terrible. May be about the same. No eye pain.        Last edited by [name], MD on 11/27/2019  2:43 PM. (History)    pt states his vision is stable since last visit  Referring physician:   HISTORICAL INFORMATION:   Selected notes from the [facility] Referred by [name], [place] LEE:  Ocular Hx- PMH-    CURRENT MEDICATIONS: No current outpatient medications on file. (Ophthalmic Drugs)   No current facility-administered medications for this visit. (Ophthalmic Drugs)   Current Outpatient Medications (Other)  Medication Sig  . allopurinol (ZYLOPRIM) 100 MG tablet TK 1 T PO D  . carvedilol (COREG) 6.25 MG tablet Take 6.25 mg by mouth 2 (two) times daily.  . Cholecalciferol (VITAMIN D3) 50 MCG (2000 UT) TABS Take 1 tablet by mouth 2 (two) times daily.  Marland Kitchen dicyclomine (BENTYL) 10 MG capsule Take 10 mg by mouth 4 (four) times daily.  . digoxin (LANOXIN) 0.125 MG tablet Take 0.125 mg by mouth as directed. Patient takes medication on M/W/F  . Ferrous Sulfate (IRON) 325 (65 Fe) MG TABS Take 1 tablet by mouth 2 (two) times daily.  . furosemide (LASIX) 40 MG tablet Take 40 mg by mouth daily.  Marland Kitchen gabapentin (NEURONTIN) 400 MG capsule Take 400 mg by mouth at bedtime.  Marland Kitchen levothyroxine (SYNTHROID, LEVOTHROID) 88 MCG tablet Take 88 mcg by mouth daily before breakfast.  . Misc Natural Products (GLUCOSAMINE CHOND  COMPLEX/MSM PO) Take 1 tablet by mouth daily.  . Multiple Vitamins-Minerals (PRESERVISION AREDS PO) Take 1 tablet by mouth 2 (two) times daily.  . NON FORMULARY Antifungal nail cream from [facility] - faxed 03/27/2019  [facility]  . rOPINIRole (REQUIP) 0.5 MG tablet   . rosuvastatin (CRESTOR) 10 MG tablet Take 10 mg by mouth daily.  . saw palmetto 500 MG capsule Take 500 mg by mouth 2 (two) times daily.  . tamsulosin (FLOMAX) 0.4 MG CAPS capsule Take 0.4 mg by mouth daily.  . temazepam (RESTORIL) 15 MG capsule Take 15 mg by mouth at bedtime as needed for sleep.  Marland Kitchen warfarin (COUMADIN) 2 MG tablet Take 2 mg by mouth daily.   Current Facility-Administered Medications (Other)  Medication Route  . Bevacizumab (AVASTIN) SOLN 1.25 mg Intravitreal      REVIEW OF SYSTEMS: ROS    Positive for: Endocrine, Eyes   Negative for: Constitutional, Gastrointestinal, Neurological, Skin, Genitourinary, Musculoskeletal, HENT, Cardiovascular, Respiratory, Psychiatric, Allergic/Imm, Heme/Lymph   Last edited by [name], COA on 11/27/2019  2:31 PM. (History)       ALLERGIES Allergies  Allergen Reactions  . Iodinated Diagnostic Agents Rash    PAST MEDICAL HISTORY Past Medical History:  Diagnosis Date  . Antiphospholipid antibody positive   . Aortic aneurysm ([place])   . Aortic  dissection ([place])   . Central retinal artery occlusion   . Chronic anemia   . Chronic renal insufficiency   . COPD (chronic obstructive pulmonary disease) ([place])   . DDD (degenerative disc disease), cervical   . DJD (degenerative joint disease)   . DVT (deep venous thrombosis) ([place])   . Gastric dysmotility   . GERD (gastroesophageal reflux disease)   . Gout   . HTN (hypertension)   . Hyperlipidemia   . Myelodysplasia (myelodysplastic syndrome) ([place])   . Paget's disease of bone   . Permanent atrial fibrillation ([place])   . Rheumatoid arthritis ([place])   . Symptomatic bradycardia   . TIA (transient ischemic  attack)    Past Surgical History:  Procedure Laterality Date  . ATRIAL FIBRILLATION ABLATION  09/23/2014   Dr Encarnacion Chu in [place]  . BACK SURGERY    . CAROTID ENDARTERECTOMY    . CAROTID STENT    . CATARACT EXTRACTION Bilateral 2010   [place]  . EYE SURGERY    . LUNG DECORTICATION     [facility]  . PACEMAKER GENERATOR CHANGE  12/03/2011   MDT [facility]pta [id number] generator change by Dr Encarnacion Chu for symptomatic bradycardia  . PACEMAKER IMPLANT  2004   MDT     FAMILY HISTORY Family History  Family history unknown: Yes    SOCIAL HISTORY Social History   Tobacco Use  . Smoking status: Former Smoker    Types: Cigarettes    Quit date: 1961    Years since quitting: 60.2  . Smokeless tobacco: Never Used  Substance Use Topics  . Alcohol use: Yes    Alcohol/week: 2.0 standard drinks    Types: 2 Glasses of wine per week    Comment: daily  . Drug use: Never         OPHTHALMIC EXAM:  Base Eye Exam    Visual Acuity (Snellen - Linear)      Right Left   Dist [place] CF at 3' 20/50 -2   Dist ph [place] NI 20/40       Tonometry (Tonopen, 2:28 PM)      Right Left   Pressure 16 15       Pupils      Dark Light Shape React APD   Right 2 1 Round Minimal None   Left 2 1 Round Minimal None       Visual Fields (Counting fingers)      Left Right    Full Full       Extraocular Movement      Right Left    Full Full       Neuro/Psych    Oriented x3: Yes   Mood/Affect: Normal       Dilation    Both eyes: 1.0% Mydriacyl, 2.5% Phenylephrine @ 2:27 PM        Slit Lamp and Fundus Exam    Slit Lamp Exam      Right Left   Lids/Lashes Dermatochalasis - upper lid, Meibomian gland dysfunction Dermatochalasis - upper lid, Meibomian gland dysfunction   Conjunctiva/Sclera White and quiet White and quiet   Cornea Arcus, 2-3+ Punctate epithelial erosions, Well healed cataract wounds Arcus, 3-4+ Punctate epithelial erosions centrally, Well healed cataract wounds   Anterior Chamber Deep and quiet  Deep and quiet   Iris Round and poorly dilated Round and poorly dilated to 4.5   Lens Posterior chamber intraocular lens, trace Posterior capsular opacification Posterior chamber intraocular lens, trace Posterior capsular opacification   Vitreous Vitreous syneresis,  Posterior vitreous detachment Vitreous syneresis       Fundus Exam      Right Left   Disc +cupping, trace pallor, sharp rim, 360 PPA 360 PPA, +cupping   C/D Ratio 0.7 0.7   Macula Flat, diffuse RPE atrophy, central pigment clumping, No heme or edema Flat, Blunted foveal reflex, Drusen, RPE mottling, clumping and atrophy, No heme or edema, trace cystic changes   Vessels Vascular attenuation Vascular attenuation, Tortuous   Periphery Attached, mild reticular degeneration, mild paving stone inferiorly Attached, mild reticular degeneration        Refraction    Wearing Rx      Sphere Cylinder Axis Add   Right -1.75 +2.25 005 +2.50   Left -1.50 +2.50 175 +2.50   Type: PAL          IMAGING AND PROCEDURES  Imaging and Procedures for @TODAY @  OCT, Retina - OU - Both Eyes       Right Eye Quality was good. Central Foveal Thickness: 308. Progression has been stable. Findings include abnormal foveal contour, no IRF, no SRF, subretinal hyper-reflective material, retinal drusen , outer retinal atrophy, pigment epithelial detachment.   Left Eye Quality was good. Central Foveal Thickness: 258. Progression has been stable. Findings include abnormal foveal contour, no SRF, outer retinal atrophy, retinal drusen , pigment epithelial detachment, subretinal hyper-reflective material, no IRF (Mild improvement in cystic changes ).   Notes *Images captured and stored on drive  Diagnosis / Impression:  Exudative ARMD OU OD: +atrophic macular scar OS: Mild improvement in tr cystic changes   Clinical management:  See below  Abbreviations: NFP - Normal foveal profile. CME - cystoid macular edema. PED - pigment epithelial detachment.  IRF - intraretinal fluid. SRF - subretinal fluid. EZ - ellipsoid zone. ERM - epiretinal membrane. ORA - outer retinal atrophy. ORT - outer retinal tubulation. SRHM - subretinal hyper-reflective material        Intravitreal Injection, Pharmacologic Agent - OS - Left Eye       Time Out 11/27/2019. 2:27 PM. Confirmed correct patient, procedure, site, and patient consented.   Anesthesia Topical anesthesia was used. Anesthetic medications included Lidocaine 2%, Proparacaine 0.5%.   Procedure Preparation included 5% betadine to ocular surface, eyelid speculum. A (32g) needle was used.   Injection:  2 mg aflibercept Alfonse Flavors) SOLN   NDC: A3590391, Lot: [id number], Expiration date: 05/03/2020   Route: Intravitreal, Site: Left Eye, Waste: 0.05 mL  Post-op Post injection exam found visual acuity of at least counting fingers. The patient tolerated the procedure well. There were no complications. The patient received written and verbal post procedure care education.                 ASSESSMENT/PLAN:    ICD-10-CM   1. Exudative age-related macular degeneration of left eye with active choroidal neovascularization (HCC)  H35.3221 Intravitreal Injection, Pharmacologic Agent - OS - Left Eye    aflibercept (EYLEA) SOLN 2 mg  2. Exudative age-related macular degeneration of right eye with inactive scar ([place])  H35.3213   3. Retinal edema  H35.81 OCT, Retina - OU - Both Eyes  4. Essential hypertension  I10   5. Pseudophakia of both eyes  Z96.1   6. Hypertensive retinopathy of both eyes  H35.033     1-3. 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    OD- w/ inactive scar -- history of low vision, CF 3'  OS- w/ CNV   - history of multiple IVL OS in  [place], [place] w/ Dr. Doristine Church -- last injection 06/2018, q4-6 wk interval  - moved to [place] in November 2019 and has been followed by Dr. Baird Cancer who had recommended observation, thus pt sought second opinion  - given functional  monocular status and history of active CNV, has been receiving maintenance injections, s/p IVA #1 OS (02.25.20), #2 (04.07.20), #3 (05.05.20), #4 (06.22.20), #5 (08.03.20)  - s/p IVE OS #1 (09.14.20), #2 (10.23.20), #3 (11.30.20), #4 (01.11.21), #5 (02.15.21)  - OCT shows PEDs, CNV w/ mild interval improvement in cystic changes OS; OD with inactive scar  - BCVA 20/40 OS -- stable from prior  - recommend IVE #6 OS today, 03.22.21, w/ extension to 6 wks -- pt prefers aggressive maintenance given functional monocular stutus  - pt wishes to be treated with IVE OS  - RBA of procedure discussed, questions answered  - informed consent obtained   - Eylea informed consent form signed and scanned on 01.11.2021  - see procedure note  - Eylea4U benefits investigation started 9.14.20 -- approved for 2021  - f/u in 6 wks -- DFE, OCT, likely injection  4,5. Hypertensive retinopathy OU  - discussed importance of tight BP control  - monitor  6. Pseudophakia OU  - s/p CE/IOL OU  - monitor   Ophthalmic Meds Ordered this visit:  Meds ordered this encounter  Medications  . aflibercept (EYLEA) SOLN 2 mg       Return in about 6 weeks (around 01/08/2020) for f/u exu ARMD OU, DFE, OCT.  There are no Patient Instructions on file for this visit.   Explained the diagnoses, plan, and follow up with the patient and they expressed understanding.  Patient expressed understanding of the importance of proper follow up care.   This document serves as a record of services personally performed by Gardiner Sleeper, MD, PhD. It was created on their behalf by Leeann Must, [place], a certified ophthalmic assistant. The creation of this record is the provider's dictation and/or activities during the visit.    Electronically signed by: Leeann Must, COA @TODAY @ 3:34 PM   This document serves as a record of services personally performed by Gardiner Sleeper, MD, PhD. It was created on their behalf by Ernest Mallick, OA, an  ophthalmic assistant. The creation of this record is the provider's dictation and/or activities during the visit.    Electronically signed by: Ernest Mallick, OA 03.22.2021 3:34 PM   Gardiner Sleeper, M.D., Ph.D. Diseases & Surgery of the Retina and Vitreous [facility]  I have reviewed the above documentation for accuracy and completeness, and I agree with the above. Gardiner Sleeper, M.D., Ph.D. 11/27/19 3:34 PM   Abbreviations: M myopia (nearsighted); A astigmatism; H hyperopia (farsighted); P presbyopia; Mrx spectacle prescription;  CTL contact lenses; OD right eye; OS left eye; OU both eyes  XT exotropia; ET esotropia; PEK punctate epithelial keratitis; PEE punctate epithelial erosions; DES dry eye syndrome; MGD meibomian gland dysfunction; ATs artificial tears; PFAT's preservative free artificial tears; [place] nuclear sclerotic cataract; PSC posterior subcapsular cataract; ERM epi-retinal membrane; PVD posterior vitreous detachment; RD retinal detachment; DM diabetes mellitus; DR diabetic retinopathy; NPDR non-proliferative diabetic retinopathy; PDR proliferative diabetic retinopathy; CSME clinically significant macular edema; DME diabetic macular edema; dbh dot blot hemorrhages; CWS cotton wool spot; POAG primary open angle glaucoma; C/D cup-to-disc ratio; HVF humphrey visual field; GVF goldmann visual field; OCT optical coherence tomography; IOP intraocular pressure; BRVO Branch retinal vein occlusion; CRVO central  retinal vein occlusion; CRAO central retinal artery occlusion; BRAO branch retinal artery occlusion; RT retinal tear; SB scleral buckle; PPV pars plana vitrectomy; VH Vitreous hemorrhage; PRP panretinal laser photocoagulation; IVK intravitreal kenalog; VMT vitreomacular traction; MH Macular hole;  NVD neovascularization of the disc; NVE neovascularization elsewhere; AREDS age related eye disease study; ARMD age related macular degeneration; POAG primary open angle  glaucoma; EBMD epithelial/anterior basement membrane dystrophy; ACIOL anterior chamber intraocular lens; IOL intraocular lens; PCIOL posterior chamber intraocular lens; Phaco/IOL phacoemulsification with intraocular lens placement; [place] photorefractive keratectomy; LASIK laser assisted in situ keratomileusis; HTN hypertension; DM diabetes mellitus; COPD chronic obstructive pulmonary disease

## 2019-11-27 ENCOUNTER — Encounter (INDEPENDENT_AMBULATORY_CARE_PROVIDER_SITE_OTHER): Payer: Self-pay | Admitting: Ophthalmology

## 2019-11-27 ENCOUNTER — Ambulatory Visit (INDEPENDENT_AMBULATORY_CARE_PROVIDER_SITE_OTHER): Payer: Medicare Other | Admitting: Ophthalmology

## 2019-11-27 DIAGNOSIS — I10 Essential (primary) hypertension: Secondary | ICD-10-CM

## 2019-11-27 DIAGNOSIS — H353221 Exudative age-related macular degeneration, left eye, with active choroidal neovascularization: Secondary | ICD-10-CM | POA: Diagnosis not present

## 2019-11-27 DIAGNOSIS — H353213 Exudative age-related macular degeneration, right eye, with inactive scar: Secondary | ICD-10-CM

## 2019-11-27 DIAGNOSIS — H3581 Retinal edema: Secondary | ICD-10-CM | POA: Diagnosis not present

## 2019-11-27 DIAGNOSIS — Z961 Presence of intraocular lens: Secondary | ICD-10-CM

## 2019-11-27 DIAGNOSIS — H35033 Hypertensive retinopathy, bilateral: Secondary | ICD-10-CM

## 2019-11-27 MED ORDER — AFLIBERCEPT 2MG/0.05ML IZ SOLN FOR KALEIDOSCOPE
2.0000 mg | INTRAVITREAL | Status: AC | PRN
  Administered 2019-11-27: 2 mg via INTRAVITREAL

## 2019-12-13 ENCOUNTER — Other Ambulatory Visit: Payer: Self-pay

## 2019-12-13 ENCOUNTER — Encounter: Payer: Self-pay | Admitting: Podiatry

## 2019-12-13 ENCOUNTER — Ambulatory Visit (INDEPENDENT_AMBULATORY_CARE_PROVIDER_SITE_OTHER): Payer: Medicare Other | Admitting: Podiatry

## 2019-12-13 VITALS — Temp 96.7°F

## 2019-12-13 DIAGNOSIS — B351 Tinea unguium: Secondary | ICD-10-CM

## 2019-12-13 DIAGNOSIS — I739 Peripheral vascular disease, unspecified: Secondary | ICD-10-CM

## 2019-12-13 DIAGNOSIS — Z7901 Long term (current) use of anticoagulants: Secondary | ICD-10-CM

## 2019-12-13 DIAGNOSIS — M79675 Pain in left toe(s): Secondary | ICD-10-CM

## 2019-12-13 DIAGNOSIS — M79674 Pain in right toe(s): Secondary | ICD-10-CM

## 2019-12-13 DIAGNOSIS — R0989 Other specified symptoms and signs involving the circulatory and respiratory systems: Secondary | ICD-10-CM | POA: Diagnosis not present

## 2019-12-13 NOTE — Progress Notes (Signed)
This patient returns to my office for at risk foot care.  This patient requires this care by a professional since this patient will be at risk due to having coagulation defect.  Patient is taking coumadin.  This patient is unable to cut nails himself since the patient cannot reach his nails.These nails are painful walking and wearing shoes.  This patient presents for at risk foot care today.  General Appearance  Alert, conversant and in no acute stress.  Vascular  Dorsalis pedis and posterior tibial  pulses are not  palpable  bilaterally.  Capillary return is within normal limits  bilaterally. Temperature is within normal limits  bilaterally.  Neurologic  Senn-Weinstein monofilament wire test within normal limits  bilaterally. Muscle power within normal limits bilaterally.  Nails Thick disfigured discolored nails with subungual debris  from hallux to fifth toes bilaterally. No evidence of bacterial infection or drainage bilaterally.  Orthopedic  No limitations of motion  feet .  No crepitus or effusions noted.  No bony pathology or digital deformities noted.  Skin  Thin shiny skin  with no porokeratosis noted bilaterally.  No signs of infections or ulcers noted.     Onychomycosis  Pain in right toes  Pain in left toes  Consent was obtained for treatment procedures.   Mechanical debridement of nails 1-5  bilaterally performed with a nail nipper.  Filed with dremel without incident.    Return office visit  3 months                    Told patient to return for periodic foot care and evaluation due to potential at risk complications.   Gardiner Barefoot DPM

## 2019-12-25 ENCOUNTER — Telehealth: Payer: Self-pay

## 2019-12-25 ENCOUNTER — Ambulatory Visit (INDEPENDENT_AMBULATORY_CARE_PROVIDER_SITE_OTHER): Payer: Medicare Other | Admitting: *Deleted

## 2019-12-25 DIAGNOSIS — Z950 Presence of cardiac pacemaker: Secondary | ICD-10-CM | POA: Diagnosis not present

## 2019-12-25 DIAGNOSIS — Z7901 Long term (current) use of anticoagulants: Secondary | ICD-10-CM | POA: Insufficient documentation

## 2019-12-25 NOTE — Telephone Encounter (Signed)
The pt states his monitor turned orange when he tried to transmit. I told him to check the battery then if that do not work to call [employer] tech support for additional help.

## 2019-12-27 LAB — CUP PACEART REMOTE DEVICE CHECK
Battery Impedance: 2009 Ohm
Battery Remaining Longevity: 36 mo
Battery Voltage: 2.75 V
Brady Statistic RV Percent Paced: 11 %
Date Time Interrogation Session: 20210420140919
Implantable Lead Implant Date: 20041008
Implantable Lead Implant Date: 20041008
Implantable Lead Location: 753859
Implantable Lead Location: 753860
Implantable Lead Model: 5076
Implantable Lead Model: 5076
Implantable Pulse Generator Implant Date: 20130328
Lead Channel Impedance Value: 67 Ohm
Lead Channel Impedance Value: 801 Ohm
Lead Channel Setting Pacing Amplitude: 2.5 V
Lead Channel Setting Pacing Pulse Width: 1 ms
Lead Channel Setting Sensing Sensitivity: 4 mV

## 2019-12-27 NOTE — Progress Notes (Signed)
PPM Remote  

## 2019-12-29 DIAGNOSIS — R600 Localized edema: Secondary | ICD-10-CM | POA: Insufficient documentation

## 2020-01-03 NOTE — Progress Notes (Signed)
[facility] Note  01/08/2020     CHIEF COMPLAINT Patient presents for Retina Follow Up   HISTORY OF PRESENT ILLNESS: Steven Williams is a 86 y.o. male who presents to the clinic today for:   HPI    Retina Follow Up    Patient presents with  Wet AMD.  In both eyes.  This started 6 weeks ago.  Severity is moderate.  I, the attending physician,  performed the HPI with the patient and updated documentation appropriately.          Comments    Patient here for 6 weeks retina follow up for exu ARMD OU. Patient states vision doing terrible. Things are real fuzzy. Sometimes eye hurts all the time. Has to use drops.        Last edited by [name], MD on 01/08/2020  1:58 PM. (History)    pt states his vision is stable since last visit  Referring physician:   HISTORICAL INFORMATION:   Selected notes from the [facility] Referred by [name], [place] LEE:  Ocular Hx- PMH-    CURRENT MEDICATIONS: No current outpatient medications on file. (Ophthalmic Drugs)   No current facility-administered medications for this visit. (Ophthalmic Drugs)   Current Outpatient Medications (Other)  Medication Sig  . allopurinol (ZYLOPRIM) 100 MG tablet TK 1 T PO D  . carvedilol (COREG) 6.25 MG tablet Take 6.25 mg by mouth 2 (two) times daily.  . Cholecalciferol (VITAMIN D3) 50 MCG (2000 UT) TABS Take 1 tablet by mouth 2 (two) times daily.  Marland Kitchen dicyclomine (BENTYL) 10 MG capsule Take 10 mg by mouth 4 (four) times daily.  . digoxin (LANOXIN) 0.125 MG tablet Take 0.125 mg by mouth as directed. Patient takes medication on M/W/F  . Ferrous Sulfate (IRON) 325 (65 Fe) MG TABS Take 1 tablet by mouth 2 (two) times daily.  . furosemide (LASIX) 40 MG tablet Take 40 mg by mouth daily.  Marland Kitchen gabapentin (NEURONTIN) 400 MG capsule Take 400 mg by mouth at bedtime.  Marland Kitchen levothyroxine (SYNTHROID, LEVOTHROID) 88 MCG tablet Take 88 mcg by mouth daily before breakfast.  .  Misc Natural Products (GLUCOSAMINE CHOND COMPLEX/MSM PO) Take 1 tablet by mouth daily.  . Multiple Vitamins-Minerals (PRESERVISION AREDS PO) Take 1 tablet by mouth 2 (two) times daily.  . NON FORMULARY Antifungal nail cream from [place] - faxed 03/27/2019  [facility]  . rOPINIRole (REQUIP) 2 MG tablet Take 2 mg by mouth at bedtime.  . rosuvastatin (CRESTOR) 10 MG tablet Take 10 mg by mouth daily.  . saw palmetto 500 MG capsule Take 500 mg by mouth 2 (two) times daily.  . tamsulosin (FLOMAX) 0.4 MG CAPS capsule Take 0.4 mg by mouth daily.  . temazepam (RESTORIL) 15 MG capsule Take 15 mg by mouth at bedtime as needed for sleep.  Marland Kitchen warfarin (COUMADIN) 2 MG tablet Take 2 mg by mouth daily.   Current Facility-Administered Medications (Other)  Medication Route  . Bevacizumab (AVASTIN) SOLN 1.25 mg Intravitreal      REVIEW OF SYSTEMS: ROS    Positive for: Endocrine, Eyes   Negative for: Constitutional, Gastrointestinal, Neurological, Skin, Genitourinary, Musculoskeletal, HENT, Cardiovascular, Respiratory, Psychiatric, Allergic/Imm, Heme/Lymph   Last edited by [name], COA on 01/08/2020  1:42 PM. (History)       ALLERGIES Allergies  Allergen Reactions  . Iodinated Diagnostic Agents Rash    PAST MEDICAL HISTORY Past Medical History:  Diagnosis Date  .  Antiphospholipid antibody positive   . Aortic aneurysm ([place])   . Aortic dissection ([place])   . Central retinal artery occlusion   . Chronic anemia   . Chronic renal insufficiency   . COPD (chronic obstructive pulmonary disease) ([place])   . DDD (degenerative disc disease), cervical   . DJD (degenerative joint disease)   . DVT (deep venous thrombosis) ([place])   . Gastric dysmotility   . GERD (gastroesophageal reflux disease)   . Gout   . HTN (hypertension)   . Hyperlipidemia   . Myelodysplasia (myelodysplastic syndrome) ([place])   . Paget's disease of bone   . Permanent atrial fibrillation ([place])   . Rheumatoid arthritis  ([place])   . Symptomatic bradycardia   . TIA (transient ischemic attack)    Past Surgical History:  Procedure Laterality Date  . ATRIAL FIBRILLATION ABLATION  09/23/2014   Dr Encarnacion Chu in [place]  . BACK SURGERY    . CAROTID ENDARTERECTOMY    . CAROTID STENT    . CATARACT EXTRACTION Bilateral 2010   [place]  . EYE SURGERY    . LUNG DECORTICATION     [facility]  . PACEMAKER GENERATOR CHANGE  12/03/2011   MDT [facility] [id number] generator change by Dr Encarnacion Chu for symptomatic bradycardia  . PACEMAKER IMPLANT  2004   MDT     FAMILY HISTORY Family History  Family history unknown: Yes    SOCIAL HISTORY Social History   Tobacco Use  . Smoking status: Former Smoker    Types: Cigarettes    Quit date: 1961    Years since quitting: 60.3  . Smokeless tobacco: Never Used  Substance Use Topics  . Alcohol use: Yes    Alcohol/week: 2.0 standard drinks    Types: 2 Glasses of wine per week    Comment: daily  . Drug use: Never         OPHTHALMIC EXAM:  Base Eye Exam    Visual Acuity (Snellen - Linear)      Right Left   Dist [place] CF at 3' 20/60 -2   Dist ph  NI 20/50 -1       Tonometry (Tonopen, 1:39 PM)      Right Left   Pressure 12 12       Pupils      Dark Light Shape React APD   Right 2 1 Round Minimal None   Left 2 1 Round Minimal None       Visual Fields (Counting fingers)      Left Right    Full Full       Extraocular Movement      Right Left    Full Full       Neuro/Psych    Oriented x3: Yes   Mood/Affect: Normal       Dilation    Both eyes: 1.0% Mydriacyl, 2.5% Phenylephrine @ 1:39 PM        Slit Lamp and Fundus Exam    Slit Lamp Exam      Right Left   Lids/Lashes Dermatochalasis - upper lid, Meibomian gland dysfunction Dermatochalasis - upper lid, Meibomian gland dysfunction   Conjunctiva/Sclera White and quiet White and quiet   Cornea Arcus, 2-3+ Punctate epithelial erosions, Well healed cataract wounds Arcus, 3-4+ Punctate epithelial erosions  centrally, Well healed cataract wounds   Anterior Chamber Deep and quiet Deep and quiet   Iris Round and poorly dilated Round and poorly dilated to 4.5   Lens Posterior chamber intraocular lens, trace Posterior capsular  opacification Posterior chamber intraocular lens, trace Posterior capsular opacification   Vitreous Vitreous syneresis, Posterior vitreous detachment Vitreous syneresis       Fundus Exam      Right Left   Disc +cupping, trace pallor, sharp rim, 360 PPA 360 PPA, +cupping   C/D Ratio 0.7 0.7   Macula Flat, diffuse RPE atrophy, central pigment clumping, No heme or edema Flat, Blunted foveal reflex, Drusen, RPE mottling, clumping and atrophy, No heme or edema, trace cystic changes   Vessels Vascular attenuation Vascular attenuation, Tortuous   Periphery Attached, mild reticular degeneration, mild paving stone inferiorly Attached, mild reticular degeneration        Refraction    Wearing Rx      Sphere Cylinder Axis Add   Right -1.75 +2.25 005 +2.50   Left -1.50 +2.50 175 +2.50   Type: PAL          IMAGING AND PROCEDURES  Imaging and Procedures for @TODAY @  OCT, Retina - OU - Both Eyes       Right Eye Quality was good. Central Foveal Thickness: 304. Progression has been stable. Findings include abnormal foveal contour, no IRF, no SRF, subretinal hyper-reflective material, retinal drusen , outer retinal atrophy, pigment epithelial detachment.   Left Eye Quality was good. Central Foveal Thickness: 258. Progression has been stable. Findings include abnormal foveal contour, no SRF, outer retinal atrophy, retinal drusen , pigment epithelial detachment, subretinal hyper-reflective material, no IRF (stable improvement in cystic changes ).   Notes *Images captured and stored on drive  Diagnosis / Impression:  Exudative ARMD OU OD: +atrophic macular scar OS: stable improvement in tr cystic changes   Clinical management:  See below  Abbreviations: NFP - Normal  foveal profile. CME - cystoid macular edema. PED - pigment epithelial detachment. IRF - intraretinal fluid. SRF - subretinal fluid. EZ - ellipsoid zone. ERM - epiretinal membrane. ORA - outer retinal atrophy. ORT - outer retinal tubulation. SRHM - subretinal hyper-reflective material        Intravitreal Injection, Pharmacologic Agent - OS - Left Eye       Time Out 01/08/2020. 2:00 PM. Confirmed correct patient, procedure, site, and patient consented.   Anesthesia Topical anesthesia was used. Anesthetic medications included Lidocaine 2%, Proparacaine 0.5%.   Procedure Preparation included 5% betadine to ocular surface, eyelid speculum. A (33g) needle was used.   Injection:  2 mg aflibercept Alfonse Flavors) SOLN   NDC: A3590391, Lot: [id number], Expiration date: 06/03/2020   Route: Intravitreal, Site: Left Eye, Waste: 0.05 mL  Post-op Post injection exam found visual acuity of at least counting fingers. The patient tolerated the procedure well. There were no complications. The patient received written and verbal post procedure care education.                 ASSESSMENT/PLAN:    ICD-10-CM   1. Exudative age-related macular degeneration of left eye with active choroidal neovascularization (HCC)  H35.3221 Intravitreal Injection, Pharmacologic Agent - OS - Left Eye    aflibercept (EYLEA) SOLN 2 mg  2. Exudative age-related macular degeneration of right eye with inactive scar ([place])  H35.3213   3. Retinal edema  H35.81 OCT, Retina - OU - Both Eyes  4. Essential hypertension  I10   5. Hypertensive retinopathy of both eyes  H35.033   6. Pseudophakia of both eyes  Z96.1     1-3. 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    OD- w/ inactive scar -- history of low vision, CF  3'  OS- w/ CNV   - history of multiple IVL OS in [place], [place] w/ Dr. Doristine Church -- last injection 06/2018, q4-6 wk interval  - moved to [place] in November 2019 and has been followed by Dr. Baird Cancer who  had recommended observation, thus pt sought second opinion  - given functional monocular status and history of active CNV, has been receiving maintenance injections, s/p IVA #1 OS (02.25.20), #2 (04.07.20), #3 (05.05.20), #4 (06.22.20), #5 (08.03.20), #6 (03.22.21)  - s/p IVE OS #1 (09.14.20), #2 (10.23.20), #3 (11.30.20), #4 (01.11.21), #5 (02.15.21), #6 (03.22.21)  - OCT shows PEDs, CNV w/ mild interval improvement in cystic changes OS; OD with inactive scar  - BCVA 20/40 OS -- stable from prior  - recommend IVE #6 OS today, 05.03.21, w/ f/u in 6 wks -- pt prefers aggressive maintenance given functional monocular stutus  - pt wishes to be treated with IVE OS  - RBA of procedure discussed, questions answered  - informed consent obtained   - Eylea informed consent form signed and scanned on 01.11.2021  - see procedure note  - Eylea4U benefits investigation started 9.14.20 -- approved for 2021  - f/u in 6 wks -- DFE, OCT, likely injection  4,5. Hypertensive retinopathy OU  - discussed importance of tight BP control  - monitor  6. Pseudophakia OU  - s/p CE/IOL OU  - monitor   Ophthalmic Meds Ordered this visit:  Meds ordered this encounter  Medications  . aflibercept (EYLEA) SOLN 2 mg       Return in about 6 weeks (around 02/19/2020) for 6 weeks EXU ARMD OU, DFE, OCT.  There are no Patient Instructions on file for this visit.   Explained the diagnoses, plan, and follow up with the patient and they expressed understanding.  Patient expressed understanding of the importance of proper follow up care.   This document serves as a record of services personally performed by Gardiner Sleeper, MD, PhD. It was created on their behalf by Leeann Must, [place], a certified ophthalmic assistant. The creation of this record is the provider's dictation and/or activities during the visit.    Electronically signed by: Leeann Must, COA @TODAY @ 3:41 PM   This document serves as a record of  services personally performed by Gardiner Sleeper, MD, PhD. It was created on their behalf by Ernest Mallick, OA, an ophthalmic assistant. The creation of this record is the provider's dictation and/or activities during the visit.    Electronically signed by: Ernest Mallick, OA 05.03.2021 3:41 PM   Gardiner Sleeper, M.D., Ph.D. Diseases & Surgery of the Retina and Vitreous [facility]  I have reviewed the above documentation for accuracy and completeness, and I agree with the above. Gardiner Sleeper, M.D., Ph.D. 01/08/20 3:41 PM   Abbreviations: M myopia (nearsighted); A astigmatism; H hyperopia (farsighted); P presbyopia; Mrx spectacle prescription;  CTL contact lenses; OD right eye; OS left eye; OU both eyes  XT exotropia; ET esotropia; PEK punctate epithelial keratitis; PEE punctate epithelial erosions; DES dry eye syndrome; MGD meibomian gland dysfunction; ATs artificial tears; PFAT's preservative free artificial tears; [place] nuclear sclerotic cataract; PSC posterior subcapsular cataract; ERM epi-retinal membrane; PVD posterior vitreous detachment; RD retinal detachment; DM diabetes mellitus; DR diabetic retinopathy; NPDR non-proliferative diabetic retinopathy; PDR proliferative diabetic retinopathy; CSME clinically significant macular edema; DME diabetic macular edema; dbh dot blot hemorrhages; CWS cotton wool spot; POAG primary open angle glaucoma; C/D cup-to-disc ratio; HVF humphrey visual  field; GVF goldmann visual field; OCT optical coherence tomography; IOP intraocular pressure; BRVO Branch retinal vein occlusion; CRVO central retinal vein occlusion; CRAO central retinal artery occlusion; BRAO branch retinal artery occlusion; RT retinal tear; SB scleral buckle; PPV pars plana vitrectomy; VH Vitreous hemorrhage; PRP panretinal laser photocoagulation; IVK intravitreal kenalog; VMT vitreomacular traction; MH Macular hole;  NVD neovascularization of the disc; NVE neovascularization  elsewhere; AREDS age related eye disease study; ARMD age related macular degeneration; POAG primary open angle glaucoma; EBMD epithelial/anterior basement membrane dystrophy; ACIOL anterior chamber intraocular lens; IOL intraocular lens; PCIOL posterior chamber intraocular lens; Phaco/IOL phacoemulsification with intraocular lens placement; [place] photorefractive keratectomy; LASIK laser assisted in situ keratomileusis; HTN hypertension; DM diabetes mellitus; COPD chronic obstructive pulmonary disease

## 2020-01-08 ENCOUNTER — Ambulatory Visit (INDEPENDENT_AMBULATORY_CARE_PROVIDER_SITE_OTHER): Payer: Medicare Other | Admitting: Ophthalmology

## 2020-01-08 ENCOUNTER — Encounter (INDEPENDENT_AMBULATORY_CARE_PROVIDER_SITE_OTHER): Payer: Self-pay | Admitting: Ophthalmology

## 2020-01-08 ENCOUNTER — Other Ambulatory Visit: Payer: Self-pay

## 2020-01-08 DIAGNOSIS — H3581 Retinal edema: Secondary | ICD-10-CM

## 2020-01-08 DIAGNOSIS — Z961 Presence of intraocular lens: Secondary | ICD-10-CM

## 2020-01-08 DIAGNOSIS — I10 Essential (primary) hypertension: Secondary | ICD-10-CM

## 2020-01-08 DIAGNOSIS — H353221 Exudative age-related macular degeneration, left eye, with active choroidal neovascularization: Secondary | ICD-10-CM | POA: Diagnosis not present

## 2020-01-08 DIAGNOSIS — H353213 Exudative age-related macular degeneration, right eye, with inactive scar: Secondary | ICD-10-CM | POA: Diagnosis not present

## 2020-01-08 DIAGNOSIS — H35033 Hypertensive retinopathy, bilateral: Secondary | ICD-10-CM

## 2020-01-08 MED ORDER — AFLIBERCEPT 2MG/0.05ML IZ SOLN FOR KALEIDOSCOPE
2.0000 mg | INTRAVITREAL | Status: AC | PRN
  Administered 2020-01-08: 2 mg via INTRAVITREAL

## 2020-01-19 DIAGNOSIS — R519 Headache, unspecified: Secondary | ICD-10-CM | POA: Insufficient documentation

## 2020-02-20 NOTE — Progress Notes (Signed)
[facility] Note  02/21/2020     CHIEF COMPLAINT Patient presents for Retina Follow Up   HISTORY OF PRESENT ILLNESS: Steven Williams is a 86 y.o. male who presents to the clinic today for:   HPI    Retina Follow Up    Patient presents with  Wet AMD.  In left eye.  This started weeks ago.  Severity is moderate.  Duration of weeks.  Since onset it is stable.  I, the attending physician,  performed the HPI with the patient and updated documentation appropriately.          Comments    Pt states vision is about the same.  Pt denies eye pain or discomfort.  Pt has burning and irritation on occasion OU.  Pt denies any new or worsening floaters or fol OU.       Last edited by [name], MD on 02/23/2020 12:36 AM. (History)    pt states he is doing well  Referring physician:   HISTORICAL INFORMATION:   Selected notes from the [facility] Referred by [name], [place] LEE:  Ocular Hx- PMH-    CURRENT MEDICATIONS: No current outpatient medications on file. (Ophthalmic Drugs)   No current facility-administered medications for this visit. (Ophthalmic Drugs)   Current Outpatient Medications (Other)  Medication Sig  . allopurinol (ZYLOPRIM) 100 MG tablet TK 1 T PO D  . carvedilol (COREG) 6.25 MG tablet Take 6.25 mg by mouth 2 (two) times daily.  . Cholecalciferol (VITAMIN D3) 50 MCG (2000 UT) TABS Take 1 tablet by mouth 2 (two) times daily.  Marland Kitchen dicyclomine (BENTYL) 10 MG capsule Take 10 mg by mouth 4 (four) times daily.  . digoxin (LANOXIN) 0.125 MG tablet Take 0.125 mg by mouth as directed. Patient takes medication on M/W/F  . Ferrous Sulfate (IRON) 325 (65 Fe) MG TABS Take 1 tablet by mouth 2 (two) times daily.  . furosemide (LASIX) 40 MG tablet Take 40 mg by mouth daily.  Marland Kitchen gabapentin (NEURONTIN) 400 MG capsule Take 400 mg by mouth at bedtime.  Marland Kitchen levothyroxine (SYNTHROID, LEVOTHROID) 88 MCG tablet Take 88 mcg by mouth daily  before breakfast.  . Misc Natural Products (GLUCOSAMINE CHOND COMPLEX/MSM PO) Take 1 tablet by mouth daily.  . Multiple Vitamins-Minerals (PRESERVISION AREDS PO) Take 1 tablet by mouth 2 (two) times daily.  . NON FORMULARY Antifungal nail cream from [facility] - faxed 03/27/2019  [facility]  . rOPINIRole (REQUIP) 2 MG tablet Take 2 mg by mouth at bedtime.  . rosuvastatin (CRESTOR) 10 MG tablet Take 10 mg by mouth daily.  . saw palmetto 500 MG capsule Take 500 mg by mouth 2 (two) times daily.  . tamsulosin (FLOMAX) 0.4 MG CAPS capsule Take 0.4 mg by mouth daily.  . temazepam (RESTORIL) 15 MG capsule Take 15 mg by mouth at bedtime as needed for sleep.  Marland Kitchen warfarin (COUMADIN) 2 MG tablet Take 2 mg by mouth daily.   Current Facility-Administered Medications (Other)  Medication Route  . Bevacizumab (AVASTIN) SOLN 1.25 mg Intravitreal      REVIEW OF SYSTEMS: ROS    Positive for: Endocrine, Eyes   Negative for: Constitutional, Gastrointestinal, Neurological, Skin, Genitourinary, Musculoskeletal, HENT, Cardiovascular, Respiratory, Psychiatric, Allergic/Imm, Heme/Lymph   Last edited by Doneen Poisson on 02/21/2020  1:36 PM. (History)       ALLERGIES Allergies  Allergen Reactions  . Iodinated Diagnostic Agents Rash    PAST MEDICAL HISTORY Past Medical  History:  Diagnosis Date  . Antiphospholipid antibody positive   . Aortic aneurysm ([place])   . Aortic dissection ([place])   . Central retinal artery occlusion   . Chronic anemia   . Chronic renal insufficiency   . COPD (chronic obstructive pulmonary disease) ([place])   . DDD (degenerative disc disease), cervical   . DJD (degenerative joint disease)   . DVT (deep venous thrombosis) ([place])   . Gastric dysmotility   . GERD (gastroesophageal reflux disease)   . Gout   . HTN (hypertension)   . Hyperlipidemia   . Myelodysplasia (myelodysplastic syndrome) ([place])   . Paget's disease of bone   . Permanent atrial fibrillation ([place])   .  Rheumatoid arthritis ([place])   . Symptomatic bradycardia   . TIA (transient ischemic attack)    Past Surgical History:  Procedure Laterality Date  . ATRIAL FIBRILLATION ABLATION  09/23/2014   Dr Encarnacion Chu in [place]  . BACK SURGERY    . CAROTID ENDARTERECTOMY    . CAROTID STENT    . CATARACT EXTRACTION Bilateral 2010   [place]  . EYE SURGERY    . LUNG DECORTICATION     VATS  . PACEMAKER GENERATOR CHANGE  12/03/2011   MDT Adapta ADDR01 generator change by Dr Encarnacion Chu for symptomatic bradycardia  . PACEMAKER IMPLANT  2004   MDT     FAMILY HISTORY Family History  Family history unknown: Yes    SOCIAL HISTORY Social History   Tobacco Use  . Smoking status: Former Smoker    Types: Cigarettes    Quit date: 1961    Years since quitting: 60.5  . Smokeless tobacco: Never Used  Vaping Use  . Vaping Use: Never used  Substance Use Topics  . Alcohol use: Yes    Alcohol/week: 2.0 standard drinks    Types: 2 Glasses of wine per week    Comment: daily  . Drug use: Never         OPHTHALMIC EXAM:  Base Eye Exam    Visual Acuity (Snellen - Linear)      Right Left   Dist cc CF @ 3' 20/60 -2   Dist ph cc NI 20/50 -2   Correction: Glasses       Tonometry (Tonopen, 1:43 PM)      Right Left   Pressure 13 12       Pupils      Dark Light Shape React APD   Right 3 2 Round Minimal 0   Left 3 2 Round Minimal 0       Visual Fields      Left Right    Full Full       Extraocular Movement      Right Left    Full Full       Neuro/Psych    Oriented x3: Yes   Mood/Affect: Normal       Dilation    Both eyes: 1.0% Mydriacyl, 2.5% Phenylephrine @ 1:44 PM        Slit Lamp and Fundus Exam    Slit Lamp Exam      Right Left   Lids/Lashes Dermatochalasis - upper lid, Meibomian gland dysfunction Dermatochalasis - upper lid, Meibomian gland dysfunction   Conjunctiva/Sclera White and quiet White and quiet   Cornea Arcus, 2-3+ Punctate epithelial erosions, Well healed cataract  wounds Arcus, 3-4+ Punctate epithelial erosions centrally, Well healed cataract wounds   Anterior Chamber Deep and quiet Deep and quiet   Iris Round and poorly  dilated Round and poorly dilated to 4.5   Lens Posterior chamber intraocular lens, trace Posterior capsular opacification Posterior chamber intraocular lens, trace Posterior capsular opacification   Vitreous Vitreous syneresis, Posterior vitreous detachment Vitreous syneresis       Fundus Exam      Right Left   Disc +cupping, trace pallor, sharp rim, 360 PPA mild pallor, 360 PPA, +cupping   C/D Ratio 0.7 0.7   Macula Flat, diffuse RPE atrophy, central pigment clumping, No heme or edema Flat, Blunted foveal reflex, Drusen, RPE mottling, clumping and atrophy, No heme or edema, trace cystic changes, peripapillary GA nasal macula   Vessels Vascular attenuation Vascular attenuation, Tortuous   Periphery Attached, mild reticular degeneration, mild paving stone inferiorly Attached, mild reticular degeneration        Refraction    Wearing Rx      Sphere Cylinder Axis Add   Right -1.75 +2.25 005 +2.50   Left -1.50 +2.50 175 +2.50   Type: PAL          IMAGING AND PROCEDURES  Imaging and Procedures for @TODAY @  OCT, Retina - OU - Both Eyes       Right Eye Quality was good. Central Foveal Thickness: 266. Progression has been stable. Findings include abnormal foveal contour, no IRF, no SRF, subretinal hyper-reflective material, retinal drusen , outer retinal atrophy, pigment epithelial detachment.   Left Eye Quality was good. Central Foveal Thickness: 256. Progression has been stable. Findings include abnormal foveal contour, no SRF, outer retinal atrophy, retinal drusen , pigment epithelial detachment, subretinal hyper-reflective material, no IRF (Mild interval improvement in cystic changes ).   Notes *Images captured and stored on drive  Diagnosis / Impression:  Exudative ARMD OU OD: +atrophic macular scar OS: mild interval  improvement in cystic changes   Clinical management:  See below  Abbreviations: NFP - Normal foveal profile. CME - cystoid macular edema. PED - pigment epithelial detachment. IRF - intraretinal fluid. SRF - subretinal fluid. EZ - ellipsoid zone. ERM - epiretinal membrane. ORA - outer retinal atrophy. ORT - outer retinal tubulation. SRHM - subretinal hyper-reflective material        Intravitreal Injection, Pharmacologic Agent - OS - Left Eye       Time Out 02/21/2020. 2:40 PM. Confirmed correct patient, procedure, site, and patient consented.   Anesthesia Topical anesthesia was used. Anesthetic medications included Tetracaine 0.5%, Lidocaine 2%.   Procedure Preparation included 5% betadine to ocular surface, eyelid speculum. A (32g) needle was used.   Injection:  2 mg aflibercept Alfonse Flavors) SOLN   NDC: [id number], Lot: [id number], Expiration date: 06/03/2020   Route: Intravitreal, Site: Left Eye, Waste: 0.05 mL  Post-op Post injection exam found visual acuity of at least counting fingers. The patient tolerated the procedure well. There were no complications. The patient received written and verbal post procedure care education.                 ASSESSMENT/PLAN:    ICD-10-CM   1. Exudative age-related macular degeneration of left eye with active choroidal neovascularization (HCC)  H35.3221 Intravitreal Injection, Pharmacologic Agent - OS - Left Eye    aflibercept (EYLEA) SOLN 2 mg  2. Exudative age-related macular degeneration of right eye with inactive scar ([place])  H35.3213   3. Retinal edema  H35.81 OCT, Retina - OU - Both Eyes  4. Essential hypertension  I10   5. Hypertensive retinopathy of both eyes  H35.033   6. Pseudophakia of both eyes  Z96.1  1-3. 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    OD- w/ inactive scar -- history of low vision, CF 3'  OS- w/ CNV   - history of multiple IVL OS in [place], [place] w/ Dr. Doristine Church -- last injection  06/2018, q4-6 wk interval  - moved to [place] in November 2019 and has been followed by Dr. Baird Cancer who had recommended observation, thus pt sought second opinion  - given functional monocular status and history of active CNV, has been receiving maintenance injections, s/p IVA #1 OS (02.25.20), #2 (04.07.20), #3 (05.05.20), #4 (06.22.20), #5 (08.03.20), #6 (03.22.21)  - s/p IVE OS #1 (09.14.20), #2 (10.23.20), #3 (11.30.20), #4 (01.11.21), #5 (02.15.21), #6 (03.22.21), #7 (05.03.21)  - OCT shows PEDs, CNV w/ mild interval improvement in cystic changes OS; OD with inactive scar  - BCVA 20/50 OS -- stable from prior  - recommend IVE #8 OS today, 06.16.21, w/ f/u in 6 wks -- pt prefers aggressive maintenance given functional monocular stutus  - pt wishes to be treated with IVE OS  - RBA of procedure discussed, questions answered  - informed consent obtained   - Eylea informed consent form signed and scanned on 01.11.2021  - see procedure note  - Eylea4U benefits investigation started 9.14.20 -- approved for 2021  - f/u in 6 wks -- DFE, OCT, likely injection  4,5. Hypertensive retinopathy OU  - discussed importance of tight BP control  - monitor  6. Pseudophakia OU  - s/p CE/IOL OU  - monitor   Ophthalmic Meds Ordered this visit:  Meds ordered this encounter  Medications  . aflibercept (EYLEA) SOLN 2 mg       Return in about 6 weeks (around 04/03/2020) for f/u exu ARMD OS, DFE, OCT.  There are no Patient Instructions on file for this visit.   Explained the diagnoses, plan, and follow up with the patient and they expressed understanding.  Patient expressed understanding of the importance of proper follow up care.   Gardiner Sleeper, M.D., Ph.D. Diseases & Surgery of the Retina and [place] 02/21/2020   I have reviewed the above documentation for accuracy and completeness, and I agree with the above. Gardiner Sleeper, M.D., Ph.D. 02/23/20 12:39  AM   Abbreviations: M myopia (nearsighted); A astigmatism; H hyperopia (farsighted); P presbyopia; Mrx spectacle prescription;  CTL contact lenses; OD right eye; OS left eye; OU both eyes  XT exotropia; ET esotropia; PEK punctate epithelial keratitis; PEE punctate epithelial erosions; DES dry eye syndrome; MGD meibomian gland dysfunction; ATs artificial tears; PFAT's preservative free artificial tears; [place] nuclear sclerotic cataract; PSC posterior subcapsular cataract; ERM epi-retinal membrane; PVD posterior vitreous detachment; RD retinal detachment; DM diabetes mellitus; DR diabetic retinopathy; NPDR non-proliferative diabetic retinopathy; PDR proliferative diabetic retinopathy; CSME clinically significant macular edema; DME diabetic macular edema; dbh dot blot hemorrhages; CWS cotton wool spot; POAG primary open angle glaucoma; C/D cup-to-disc ratio; HVF humphrey visual field; GVF goldmann visual field; OCT optical coherence tomography; IOP intraocular pressure; BRVO Branch retinal vein occlusion; CRVO central retinal vein occlusion; CRAO central retinal artery occlusion; BRAO branch retinal artery occlusion; RT retinal tear; SB scleral buckle; PPV pars plana vitrectomy; VH Vitreous hemorrhage; PRP panretinal laser photocoagulation; IVK intravitreal kenalog; VMT vitreomacular traction; MH Macular hole;  NVD neovascularization of the disc; NVE neovascularization elsewhere; AREDS age related eye disease study; ARMD age related macular degeneration; POAG primary open angle glaucoma; EBMD epithelial/anterior basement membrane dystrophy; ACIOL anterior chamber  intraocular lens; IOL intraocular lens; PCIOL posterior chamber intraocular lens; Phaco/IOL phacoemulsification with intraocular lens placement; [place] photorefractive keratectomy; LASIK laser assisted in situ keratomileusis; HTN hypertension; DM diabetes mellitus; COPD chronic obstructive pulmonary disease

## 2020-02-21 ENCOUNTER — Ambulatory Visit (INDEPENDENT_AMBULATORY_CARE_PROVIDER_SITE_OTHER): Payer: Medicare Other | Admitting: Ophthalmology

## 2020-02-21 ENCOUNTER — Other Ambulatory Visit: Payer: Self-pay

## 2020-02-21 DIAGNOSIS — Z961 Presence of intraocular lens: Secondary | ICD-10-CM

## 2020-02-21 DIAGNOSIS — I10 Essential (primary) hypertension: Secondary | ICD-10-CM

## 2020-02-21 DIAGNOSIS — H353213 Exudative age-related macular degeneration, right eye, with inactive scar: Secondary | ICD-10-CM

## 2020-02-21 DIAGNOSIS — H353221 Exudative age-related macular degeneration, left eye, with active choroidal neovascularization: Secondary | ICD-10-CM

## 2020-02-21 DIAGNOSIS — H3581 Retinal edema: Secondary | ICD-10-CM

## 2020-02-21 DIAGNOSIS — H35033 Hypertensive retinopathy, bilateral: Secondary | ICD-10-CM

## 2020-02-23 ENCOUNTER — Encounter (INDEPENDENT_AMBULATORY_CARE_PROVIDER_SITE_OTHER): Payer: Self-pay | Admitting: Ophthalmology

## 2020-02-23 MED ORDER — AFLIBERCEPT 2MG/0.05ML IZ SOLN FOR KALEIDOSCOPE
2.0000 mg | INTRAVITREAL | Status: AC | PRN
  Administered 2020-02-23: 2 mg via INTRAVITREAL

## 2020-02-29 NOTE — Progress Notes (Signed)
Electrophysiology Office Note Date: 03/04/2020  ID:  Steven Williams, DOB May 29, 1930, MRN [id number]  PCP: Steven Redwood, MD Primary Cardiologist: No primary care provider on file. Electrophysiologist: Steven Grayer, MD   CC: Pacemaker follow-up  Steven Williams is a 86 y.o. male seen today for Steven Grayer, MD for routine electrophysiology followup.  Since last being seen in our clinic the patient reports doing very well overall.  He feels like he has gradually slowed down and attributes it to his age. He has occasional peripheral edema that is well controlled on lasix. he denies chest pain, palpitations, dyspnea, PND, orthopnea, nausea, vomiting, dizziness, syncope, weight gain, or early satiety.  Device History: Medtronic Dual Chamber PPM implanted 11/2011 for symptomatic bradycardia  Past Medical History:  Diagnosis Date  . Antiphospholipid antibody positive   . Aortic aneurysm ([place])   . Aortic dissection ([place])   . Central retinal artery occlusion   . Chronic anemia   . Chronic renal insufficiency   . COPD (chronic obstructive pulmonary disease) ([place])   . DDD (degenerative disc disease), cervical   . DJD (degenerative joint disease)   . DVT (deep venous thrombosis) ([place])   . Gastric dysmotility   . GERD (gastroesophageal reflux disease)   . Gout   . HTN (hypertension)   . Hyperlipidemia   . Myelodysplasia (myelodysplastic syndrome) ([place])   . Paget's disease of bone   . Permanent atrial fibrillation ([place])   . Rheumatoid arthritis ([place])   . Symptomatic bradycardia   . TIA (transient ischemic attack)    Past Surgical History:  Procedure Laterality Date  . ATRIAL FIBRILLATION ABLATION  09/23/2014   Dr Steven Williams in [place]  . BACK SURGERY    . CAROTID ENDARTERECTOMY    . CAROTID STENT    . CATARACT EXTRACTION Bilateral 2010   [place]  . EYE SURGERY    . LUNG DECORTICATION     VATS  . PACEMAKER GENERATOR CHANGE  12/03/2011   MDT Adapta ADDR01 generator change by Dr Steven Williams  for symptomatic bradycardia  . PACEMAKER IMPLANT  2004   MDT     Current Outpatient Medications  Medication Sig Dispense Refill  . allopurinol (ZYLOPRIM) 100 MG tablet TK 1 T PO D    . carvedilol (COREG) 6.25 MG tablet Take 6.25 mg by mouth 2 (two) times daily.    . Cholecalciferol (VITAMIN D3) 50 MCG (2000 UT) TABS Take 1 tablet by mouth 2 (two) times daily.    Marland Kitchen dicyclomine (BENTYL) 10 MG capsule Take 10 mg by mouth 4 (four) times daily.    . digoxin (LANOXIN) 0.125 MG tablet Take 0.125 mg by mouth as directed. Patient takes medication on M/W/F    . Ferrous Sulfate (IRON) 325 (65 Fe) MG TABS Take 1 tablet by mouth 2 (two) times daily.    . furosemide (LASIX) 40 MG tablet Take 40 mg by mouth daily.    Marland Kitchen gabapentin (NEURONTIN) 400 MG capsule Take 400 mg by mouth at bedtime.    Marland Kitchen levothyroxine (SYNTHROID, LEVOTHROID) 88 MCG tablet Take 88 mcg by mouth daily before breakfast.    . Misc Natural Products (GLUCOSAMINE CHOND COMPLEX/MSM PO) Take 1 tablet by mouth daily.    . Multiple Vitamins-Minerals (PRESERVISION AREDS PO) Take 1 tablet by mouth 2 (two) times daily.    . NON FORMULARY Antifungal nail cream from [facility] - faxed 03/27/2019  [facility]    . pregabalin (LYRICA) 75 MG capsule Take 75 mg by mouth 2 (two) times  daily.    . rOPINIRole (REQUIP) 2 MG tablet Take 2 mg by mouth at bedtime.    . rosuvastatin (CRESTOR) 10 MG tablet Take 10 mg by mouth daily.    . saw palmetto 500 MG capsule Take 500 mg by mouth 2 (two) times daily.    . tamsulosin (FLOMAX) 0.4 MG CAPS capsule Take 0.4 mg by mouth daily.    . temazepam (RESTORIL) 15 MG capsule Take 15 mg by mouth at bedtime as needed for sleep.    Marland Kitchen warfarin (COUMADIN) 2 MG tablet Take 2 mg by mouth daily.     Current Facility-Administered Medications  Medication Dose Route Frequency Provider Last Rate Last Admin  . Bevacizumab (AVASTIN) SOLN 1.25 mg  1.25 mg Intravitreal  Steven [name], MD   1.25 mg at 11/01/18 2343     Allergies:   Iodinated diagnostic agents   Social History: Social History   Socioeconomic History  . Marital status: Widowed    Spouse name: Not on file  . Number of children: Not on file  . Years of education: Not on file  . Highest education level: Not on file  Occupational History  . Occupation: retired  Tobacco Use  . Smoking status: Former Smoker    Types: Cigarettes    Quit date: 1961    Years since quitting: 60.5  . Smokeless tobacco: Never Used  Vaping Use  . Vaping Use: Never used  Substance and Sexual Activity  . Alcohol use: Yes    Alcohol/week: 2.0 standard drinks    Types: 2 Glasses of wine per week    Comment: daily  . Drug use: Never  . Sexual activity: Not on file  Other Topics Concern  . Not on file  Social History Narrative   Previously lived in [place]   Now lives in [place] ([place])   Retired Chief Financial Officer   Social Determinants of Radio broadcast assistant Strain:   . Difficulty of Paying Living Expenses:   Food Insecurity:   . Worried About Charity fundraiser in the Last Year:   . Arboriculturist in the Last Year:   Transportation Needs:   . Film/video editor (Medical):   Marland Kitchen Lack of Transportation (Non-Medical):   Physical Activity:   . Days of Exercise per Week:   . Minutes of Exercise per Session:   Stress:   . Feeling of Stress :   Social Connections:   . Frequency of Communication with Friends and Family:   . Frequency of Social Gatherings with Friends and Family:   . Attends Religious Services:   . Active Member of Clubs or Organizations:   . Attends Archivist Meetings:   Marland Kitchen Marital Status:   Intimate Partner Violence:   . Fear of Current or Ex-Partner:   . Emotionally Abused:   Marland Kitchen Physically Abused:   . Sexually Abused:     Family History: Family History  Family history unknown: Yes     Review of Systems: All other systems reviewed and are otherwise negative except as noted above.  Physical  Exam: Vitals:   03/04/20 1204  BP: (!) 104/52  Pulse: 72  SpO2: 99%  Weight: 191 lb (86.6 kg)  Height: 6\' 1"  (1.854 m)     GEN- The patient is well appearing, alert and oriented x 3 today.   HEENT: normocephalic, atraumatic; sclera clear, conjunctiva pink; hearing intact; oropharynx clear; neck supple  Lungs- Clear to ausculation bilaterally, normal work of breathing.  No wheezes, rales, rhonchi Heart- Irregularly irregular rate and rhythm, no murmurs, rubs or gallops  GI- soft, non-tender, non-distended, bowel sounds present  Extremities- no clubbing, cyanosis, or edema  MS- no significant deformity or atrophy Skin- warm and dry, no rash or lesion; PPM pocket well healed Psych- euthymic mood, full affect Neuro- strength and sensation are intact  PPM Interrogation- reviewed in detail today,  See PACEART report  EKG:  EKG is ordered today. The ekg ordered today shows atrial fibrillation with controlled ventricular rate at 72 bpm  Recent Labs: 08/30/2019: BUN 31; Creatinine, Ser 1.44; Hemoglobin 10.3; Platelets 109; Potassium 4.0; Sodium 141   Wt Readings from Last 3 Encounters:  03/04/20 191 lb (86.6 kg)  10/17/19 180 lb 8 oz (81.9 kg)  08/30/19 178 lb (80.7 kg)     Other studies Reviewed: Additional studies/ records that were reviewed today include: Previous EP office notes, Previous remote checks, Most recent labwork.   Assessment and Plan:  1. Symptomatic bradycardia s/p Medtronic PPM  Normal PPM function See Pace Art report No changes today  2. Permanent Afib Rate relatively well controlled.  Continue coumadin for CHA2DS2VASC of at least 6   Continue digoxin. Level on Wednesday (Pt knows to hold his digoxin prior to the test, and OK to take it after), as he has already taken it today.  BMET on lasix, CBC on coumadin.   3. HTN Stable.   4. High risk drug monitoring Digoxin level as above.  Current medicines are reviewed at length with the patient today.    The patient does not have concerns regarding his medicines.  The following changes were made today:  none  Labs/ tests ordered today include:  Orders Placed This Encounter  Procedures  . Basic Metabolic Panel (BMET)  . CBC w/Diff  . Digoxin level  . EKG 12-Lead   Disposition:   Follow up with Dr. Rayann Heman in 6 months.    Jacalyn Lefevre, PA-C  03/04/2020 12:37 PM  [place] [address] [phone] (office) [phone] (fax)

## 2020-03-01 ENCOUNTER — Other Ambulatory Visit (HOSPITAL_COMMUNITY): Payer: Self-pay

## 2020-03-01 DIAGNOSIS — R1310 Dysphagia, unspecified: Secondary | ICD-10-CM

## 2020-03-01 DIAGNOSIS — R059 Cough, unspecified: Secondary | ICD-10-CM

## 2020-03-04 ENCOUNTER — Encounter: Payer: Self-pay | Admitting: Student

## 2020-03-04 ENCOUNTER — Other Ambulatory Visit: Payer: Self-pay

## 2020-03-04 ENCOUNTER — Ambulatory Visit (INDEPENDENT_AMBULATORY_CARE_PROVIDER_SITE_OTHER): Payer: Medicare Other | Admitting: Student

## 2020-03-04 VITALS — BP 104/52 | HR 72 | Ht 73.0 in | Wt 191.0 lb

## 2020-03-04 DIAGNOSIS — Z950 Presence of cardiac pacemaker: Secondary | ICD-10-CM | POA: Diagnosis not present

## 2020-03-04 DIAGNOSIS — I10 Essential (primary) hypertension: Secondary | ICD-10-CM

## 2020-03-04 DIAGNOSIS — Z79899 Other long term (current) drug therapy: Secondary | ICD-10-CM | POA: Diagnosis not present

## 2020-03-04 DIAGNOSIS — I4821 Permanent atrial fibrillation: Secondary | ICD-10-CM | POA: Diagnosis not present

## 2020-03-04 NOTE — Patient Instructions (Signed)
Medication Instructions:  *If you need a refill on your cardiac medications before your next appointment, please call your pharmacy*  Lab Work: Your physician has recommended that you have lab work on Wednesday (03/06/20) : BMET, CBC, and Digoxin  If you have labs (blood work) drawn today and your tests are completely normal, you will receive your results only by: Marland Kitchen MyChart Message (if you have MyChart) OR . A paper copy in the mail If you have any lab test that is abnormal or we need to change your treatment, we will call you to review the results.  Testing/Procedures: None Ordered  Follow-Up: At [facility], you and your health needs are our priority.  As part of our continuing mission to provide you with exceptional heart care, we have created designated Provider Care Teams.  These Care Teams include your primary Cardiologist (physician) and Advanced Practice Providers (APPs -  Physician Assistants and Nurse Practitioners) who all work together to provide you with the care you need, when you need it.  We recommend signing up for the patient portal called "MyChart".  Sign up information is provided on this After Visit Summary.  MyChart is used to connect with patients for Virtual Visits (Telemedicine).  Patients are able to view lab/test results, encounter notes, upcoming appointments, etc.  Non-urgent messages can be sent to your provider as well.   To learn more about what you can do with MyChart, go to [email or web address].    Your next appointment:   Your physician wants you to follow-up in: 6 MONTHS with [name], PA-C. You will receive a reminder letter in the mail two months in advance. If you don't receive a letter, please call our office to schedule the follow-up appointment.  Remote monitoring is used to monitor your Pacemaker from home. This monitoring reduces the number of office visits required to check your device to one time per year. It allows [place] to keep an eye on  the functioning of your device to ensure it is working properly. You are scheduled for a device check from home on 03/25/20. You may send your transmission at any time that day. If you have a wireless device, the transmission will be sent automatically. After your physician reviews your transmission, you will receive a postcard with your next transmission date.  The format for your next appointment:   In Person with [name], PA-C

## 2020-03-06 ENCOUNTER — Other Ambulatory Visit: Payer: Medicare Other | Admitting: *Deleted

## 2020-03-06 ENCOUNTER — Other Ambulatory Visit: Payer: Self-pay

## 2020-03-06 DIAGNOSIS — Z950 Presence of cardiac pacemaker: Secondary | ICD-10-CM

## 2020-03-06 DIAGNOSIS — Z79899 Other long term (current) drug therapy: Secondary | ICD-10-CM

## 2020-03-06 DIAGNOSIS — I4821 Permanent atrial fibrillation: Secondary | ICD-10-CM

## 2020-03-07 ENCOUNTER — Telehealth: Payer: Self-pay

## 2020-03-07 DIAGNOSIS — D696 Thrombocytopenia, unspecified: Secondary | ICD-10-CM

## 2020-03-07 DIAGNOSIS — R6889 Other general symptoms and signs: Secondary | ICD-10-CM

## 2020-03-07 LAB — BASIC METABOLIC PANEL
BUN/Creatinine Ratio: 32 — ABNORMAL HIGH (ref 10–24)
BUN: 47 mg/dL — ABNORMAL HIGH (ref 8–27)
CO2: 23 mmol/L (ref 20–29)
Calcium: 9.1 mg/dL (ref 8.6–10.2)
Chloride: 101 mmol/L (ref 96–106)
Creatinine, Ser: 1.46 mg/dL — ABNORMAL HIGH (ref 0.76–1.27)
GFR calc Af Amer: 49 mL/min/{1.73_m2} — ABNORMAL LOW (ref >59)
GFR calc non Af Amer: 42 mL/min/{1.73_m2} — ABNORMAL LOW (ref >59)
Glucose: 84 mg/dL (ref 65–99)
Potassium: 4.4 mmol/L (ref 3.5–5.2)
Sodium: 142 mmol/L (ref 134–144)

## 2020-03-07 LAB — CBC WITH DIFFERENTIAL/PLATELET
Basophils Absolute: 0 10*3/uL (ref 0.0–0.2)
Basos: 1 %
EOS (ABSOLUTE): 0.1 10*3/uL (ref 0.0–0.4)
Eos: 2 %
Hematocrit: 31.4 % — ABNORMAL LOW (ref 37.5–51.0)
Hemoglobin: 10.7 g/dL — ABNORMAL LOW (ref 13.0–17.7)
Immature Grans (Abs): 0 10*3/uL (ref 0.0–0.1)
Immature Granulocytes: 0 %
Lymphocytes Absolute: 1 10*3/uL (ref 0.7–3.1)
Lymphs: 23 %
MCH: 34.2 pg — ABNORMAL HIGH (ref 26.6–33.0)
MCHC: 34.1 g/dL (ref 31.5–35.7)
MCV: 100 fL — ABNORMAL HIGH (ref 79–97)
Monocytes Absolute: 0.6 10*3/uL (ref 0.1–0.9)
Monocytes: 15 %
Neutrophils Absolute: 2.4 10*3/uL (ref 1.4–7.0)
Neutrophils: 59 %
Platelets: 78 10*3/uL — CL (ref 150–450)
RBC: 3.13 x10E6/uL — ABNORMAL LOW (ref 4.14–5.80)
RDW: 12 % (ref 11.6–15.4)
WBC: 4.2 10*3/uL (ref 3.4–10.8)

## 2020-03-07 LAB — DIGOXIN LEVEL: Digoxin, Serum: 0.5 ng/mL (ref 0.5–0.9)

## 2020-03-07 NOTE — Telephone Encounter (Signed)
LMTCB for results and recommendations.

## 2020-03-07 NOTE — Addendum Note (Signed)
Addended by: Campbell Riches on: 03/07/2020 02:06 PM   Modules accepted: Orders

## 2020-03-07 NOTE — Telephone Encounter (Signed)
Pt is aware and agreeable to lab results.Pt is scheduled to have repeat CBC on 7/2 if he can arrange transportation. I instructed the patient to call the office and update [place] on whether or not he can keep that appointment or if he needs to reschedule. He is agreeable. Orders are in.

## 2020-03-07 NOTE — Telephone Encounter (Signed)
Follow Up  Patient is returning call. Patient hung up while on hold to be transferred, tried calling back, no answer.

## 2020-03-08 ENCOUNTER — Other Ambulatory Visit: Payer: Medicare Other | Admitting: *Deleted

## 2020-03-08 ENCOUNTER — Other Ambulatory Visit: Payer: Self-pay

## 2020-03-08 DIAGNOSIS — R6889 Other general symptoms and signs: Secondary | ICD-10-CM

## 2020-03-08 DIAGNOSIS — D696 Thrombocytopenia, unspecified: Secondary | ICD-10-CM

## 2020-03-09 LAB — CBC WITH DIFFERENTIAL/PLATELET
Basophils Absolute: 0 10*3/uL (ref 0.0–0.2)
Basos: 1 %
EOS (ABSOLUTE): 0.1 10*3/uL (ref 0.0–0.4)
Eos: 2 %
Hematocrit: 29.5 % — ABNORMAL LOW (ref 37.5–51.0)
Hemoglobin: 10.3 g/dL — ABNORMAL LOW (ref 13.0–17.7)
Immature Grans (Abs): 0 10*3/uL (ref 0.0–0.1)
Immature Granulocytes: 0 %
Lymphocytes Absolute: 1 10*3/uL (ref 0.7–3.1)
Lymphs: 24 %
MCH: 33.7 pg — ABNORMAL HIGH (ref 26.6–33.0)
MCHC: 34.9 g/dL (ref 31.5–35.7)
MCV: 96 fL (ref 79–97)
Monocytes Absolute: 0.6 10*3/uL (ref 0.1–0.9)
Monocytes: 15 %
Neutrophils Absolute: 2.4 10*3/uL (ref 1.4–7.0)
Neutrophils: 58 %
Platelets: 82 10*3/uL — CL (ref 150–450)
RBC: 3.06 x10E6/uL — ABNORMAL LOW (ref 4.14–5.80)
RDW: 11.9 % (ref 11.6–15.4)
WBC: 4.1 10*3/uL (ref 3.4–10.8)

## 2020-03-12 ENCOUNTER — Encounter: Payer: Self-pay | Admitting: *Deleted

## 2020-03-13 ENCOUNTER — Other Ambulatory Visit: Payer: Self-pay

## 2020-03-13 ENCOUNTER — Ambulatory Visit: Payer: Medicare Other | Admitting: Podiatry

## 2020-03-13 ENCOUNTER — Ambulatory Visit (INDEPENDENT_AMBULATORY_CARE_PROVIDER_SITE_OTHER): Payer: Medicare Other | Admitting: Diagnostic Neuroimaging

## 2020-03-13 ENCOUNTER — Encounter: Payer: Self-pay | Admitting: Diagnostic Neuroimaging

## 2020-03-13 VITALS — BP 102/65 | HR 65 | Ht 73.0 in | Wt 190.0 lb

## 2020-03-13 DIAGNOSIS — G629 Polyneuropathy, unspecified: Secondary | ICD-10-CM

## 2020-03-13 NOTE — Patient Instructions (Signed)
NEUROPATHY (? Age related; idiopathic; CKD) - continue lyrica 150mg  at bedtime; may increase as tolerated up to 150mg  twice a day  - continue ropinirole 2mg  at bedtime - start alpha-lipoic acid

## 2020-03-13 NOTE — Progress Notes (Signed)
[facility]  PATIENT: Steven Williams DOB: 1929/10/29  REFERRING CLINICIAN: [name], MD HISTORY FROM: patient  REASON FOR VISIT: new consult    HISTORICAL  CHIEF COMPLAINT:  Chief Complaint  Patient presents with  . Peripheral Neuropathy    rm 6 New Pt "both feet and legs- like an electrical shock, mostly at night, sometimes the pain lingers, walking helps"    HISTORY OF PRESENT ILLNESS:   86 year old male here for evaluation of neuropathy and restless leg syndrome.  For past 5 years patient has had numbness and tingling in his toes and feet, progressively increasing up to his shins.  He also has history of low back surgeries x2.  Patient was diagnosed with neuropathy, but no specific cause was found.  He has been started on gabapentin, switched to Lyrica with mild relief.  Also was developing restless leg symptoms at nighttime and starting ropinirole which seems to have helped.  No numbness or tingling in fingers or hands.    REVIEW OF SYSTEMS: Full 14 system review of systems performed and negative with exception of: As per HPI.  ALLERGIES: Allergies  Allergen Reactions  . Iodinated Diagnostic Agents Rash    HOME MEDICATIONS: Outpatient Medications Prior to Visit  Medication Sig Dispense Refill  . allopurinol (ZYLOPRIM) 100 MG tablet TK 1 T PO D    . carvedilol (COREG) 6.25 MG tablet Take 6.25 mg by mouth 2 (two) times daily.    . Cholecalciferol (VITAMIN D3) 50 MCG (2000 UT) TABS Take 1 tablet by mouth 2 (two) times daily.    Marland Kitchen dicyclomine (BENTYL) 10 MG capsule Take 10 mg by mouth 4 (four) times daily.    . digoxin (LANOXIN) 0.125 MG tablet Take 0.125 mg by mouth as directed. Patient takes medication on M/W/F    . Ferrous Sulfate (IRON) 325 (65 Fe) MG TABS Take 1 tablet by mouth 2 (two) times daily.    . furosemide (LASIX) 40 MG tablet Take 40 mg by mouth daily.    Marland Kitchen levothyroxine (SYNTHROID, LEVOTHROID) 88 MCG tablet Take 88 mcg by mouth  daily before breakfast.    . Misc Natural Products (GLUCOSAMINE CHOND COMPLEX/MSM PO) Take 1 tablet by mouth daily.    . Multiple Vitamins-Minerals (PRESERVISION AREDS PO) Take 1 tablet by mouth 2 (two) times daily.    . NON FORMULARY Antifungal nail cream from [facility] - faxed 03/27/2019  [facility]    . pregabalin (LYRICA) 75 MG capsule Take 75 mg by mouth 2 (two) times daily.    Marland Kitchen rOPINIRole (REQUIP) 2 MG tablet Take 2 mg by mouth at bedtime.    . rosuvastatin (CRESTOR) 10 MG tablet Take 10 mg by mouth daily.    . saw palmetto 500 MG capsule Take 500 mg by mouth 2 (two) times daily.    . tamsulosin (FLOMAX) 0.4 MG CAPS capsule Take 0.4 mg by mouth daily.    Marland Kitchen warfarin (COUMADIN) 2 MG tablet Take 2 mg by mouth daily.    Marland Kitchen gabapentin (NEURONTIN) 400 MG capsule Take 400 mg by mouth at bedtime. (Patient not taking: Reported on 03/13/2020)    . temazepam (RESTORIL) 15 MG capsule Take 15 mg by mouth at bedtime as needed for sleep. (Patient not taking: Reported on 03/13/2020)     Facility-Administered Medications Prior to Visit  Medication Dose Route Frequency Provider Last Rate Last Admin  . Bevacizumab (AVASTIN) SOLN 1.25 mg  1.25 mg Intravitreal  [name], MD   1.25 mg at  11/01/18 2343    PAST MEDICAL HISTORY: Past Medical History:  Diagnosis Date  . Antiphospholipid antibody positive   . Aortic aneurysm [facility])    thoracic, Dr Roxan Hockey  . Aortic dissection ([place])   . BPH (benign prostatic hyperplasia)   . Central retinal artery occlusion   . Chronic anemia   . Chronic renal insufficiency   . COPD (chronic obstructive pulmonary disease) ([place])   . DDD (degenerative disc disease), cervical   . DJD (degenerative joint disease)   . DVT (deep venous thrombosis) ([place])   . Gastric dysmotility   . GERD (gastroesophageal reflux disease)   . Gout   . HTN (hypertension)   . Hyperlipidemia   . Hypothyroid   . Macular degeneration   . Myelodysplasia (myelodysplastic syndrome) ([place])    . Paget's disease of bone   . Peripheral neuropathy   . Permanent atrial fibrillation ([place])   . Rheumatoid arthritis ([place])   . Symptomatic bradycardia   . TIA (transient ischemic attack)     PAST SURGICAL HISTORY: Past Surgical History:  Procedure Laterality Date  . ATRIAL FIBRILLATION ABLATION  09/23/2014   Dr Encarnacion Chu in [place]  . BACK SURGERY     lumbar, x 2  . CAROTID ENDARTERECTOMY Right 1990  . CAROTID STENT    . CATARACT EXTRACTION Bilateral 2010   [place]  . EYE SURGERY    . LUNG DECORTICATION     VATS  . PACEMAKER GENERATOR CHANGE  12/03/2011   MDT [facility] ADDR01 generator change by Dr Encarnacion Chu for symptomatic bradycardia  . PACEMAKER IMPLANT  2004   MDT     FAMILY HISTORY: Family History  Problem Relation Age of Onset  . Cirrhosis Mother   . Stroke Father   . Lung cancer Sister   . Lung cancer Brother     SOCIAL HISTORY: Social History   Socioeconomic History  . Marital status: Widowed    Spouse name: Not on file  . Number of children: 3  . Years of education: Not on file  . Highest education level: Bachelor's degree (e.g., BA, AB, BS)  Occupational History  . Occupation: retired    Comment: retired [employer]  Tobacco Use  . Smoking status: Former Smoker    Packs/day: 2.00    Years: 12.00    Pack years: 24.00    Types: Cigarettes    Quit date: 09/07/1962    Years since quitting: 57.5  . Smokeless tobacco: Never Used  Vaping Use  . Vaping Use: Never used  Substance and Sexual Activity  . Alcohol use: Yes    Alcohol/week: 2.0 standard drinks    Types: 2 Glasses of wine per week    Comment: daily  . Drug use: Never  . Sexual activity: Not on file  Other Topics Concern  . Not on file  Social History Narrative   Previously lived in [place]   03/12/20 Now lives in [place]   Retired Chief Financial Officer   Caffeine 2 a day   Social Determinants of Radio broadcast assistant Strain:   . Difficulty of Paying Living Expenses:   Food  Insecurity:   . Worried About Charity fundraiser in the Last Year:   . Arboriculturist in the Last Year:   Transportation Needs:   . Film/video editor (Medical):   Marland Kitchen Lack of Transportation (Non-Medical):   Physical Activity:   . Days of Exercise per Week:   . Minutes of Exercise per Session:  Stress:   . Feeling of Stress :   Social Connections:   . Frequency of Communication with Friends and Family:   . Frequency of Social Gatherings with Friends and Family:   . Attends Religious Services:   . Active Member of Clubs or Organizations:   . Attends Archivist Meetings:   Marland Kitchen Marital Status:   Intimate Partner Violence:   . Fear of Current or Ex-Partner:   . Emotionally Abused:   Marland Kitchen Physically Abused:   . Sexually Abused:      PHYSICAL EXAM  GENERAL EXAM/CONSTITUTIONAL: Vitals:  Vitals:   03/13/20 1005  BP: 102/65  Pulse: 65  Weight: 190 lb (86.2 kg)  Height: 6\' 1"  (1.854 m)     Body mass index is 25.07 kg/m. Wt Readings from Last 3 Encounters:  03/13/20 190 lb (86.2 kg)  03/04/20 191 lb (86.6 kg)  10/17/19 180 lb 8 oz (81.9 kg)     Patient is in no distress; well developed, nourished and groomed; neck is supple  CARDIOVASCULAR:  Examination of carotid arteries is normal; no carotid bruits  Regular rate and rhythm, no murmurs  Examination of peripheral vascular system by observation and palpation is normal  EYES:  Ophthalmoscopic exam of optic discs and posterior segments is normal; no papilledema or hemorrhages  No exam data present  MUSCULOSKELETAL:  Gait, strength, tone, movements noted in Neurologic exam below  NEUROLOGIC: MENTAL STATUS:  No flowsheet data found.  awake, alert, oriented to person, place and time  recent and remote memory intact  normal attention and concentration  language fluent, comprehension intact, naming intact  fund of knowledge appropriate  CRANIAL NERVE:   2nd - no papilledema on fundoscopic  exam  2nd, 3rd, 4th, 6th - pupils equal and reactive to light, visual fields full to confrontation, extraocular muscles intact, no nystagmus  5th - facial sensation symmetric  7th - facial strength symmetric  8th - hearing intact  9th - palate elevates symmetrically, uvula midline  11th - shoulder shrug symmetric  12th - tongue protrusion midline  MOTOR:   normal bulk and tone, full strength in the BUE, BLE  SENSORY:   normal and symmetric to light touch, temperature, vibration; EXCEPT DECR IN FEET AND ANKLES  COORDINATION:   finger-nose-finger, fine finger movements normal  REFLEXES:   deep tendon reflexes TRACE and symmetric  GAIT/STATION:   narrow based gait; careful with single point cane     DIAGNOSTIC DATA (LABS, IMAGING, TESTING) - I reviewed patient records, labs, notes, testing and imaging myself where available.  Lab Results  Component Value Date   WBC 4.1 03/08/2020   HGB 10.3 (L) 03/08/2020   HCT 29.5 (L) 03/08/2020   MCV 96 03/08/2020   PLT 82 (LL) 03/08/2020      Component Value Date/Time   NA 142 03/06/2020 1119   K 4.4 03/06/2020 1119   CL 101 03/06/2020 1119   CO2 23 03/06/2020 1119   GLUCOSE 84 03/06/2020 1119   BUN 47 (H) 03/06/2020 1119   CREATININE 1.46 (H) 03/06/2020 1119   CALCIUM 9.1 03/06/2020 1119   GFRNONAA 42 (L) 03/06/2020 1119   GFRAA 49 (L) 03/06/2020 1119   No results found for: CHOL, HDL, LDLCALC, LDLDIRECT, TRIG, CHOLHDL No results found for: HGBA1C No results found for: VITAMINB12 No results found for: TSH     ASSESSMENT AND PLAN  86 y.o. year old male here with:  Dx:  1. Neuropathy      PLAN:  NEUROPATHY (? Idiopathic / age related; CKD) - may consider repeat workup with labs and EMG; patient reports prior workup was negative; therefore will monitor for now - continue lyrica 150mg  at bedtime; may increase as tolerated up to 150mg  twice a day  - continue ropinirole 2mg  at bedtime - start  alpha-lipoic acid  Return for pending if symptoms worsen or fail to improve.    Penni Bombard, MD 04/09/2918, 16:60 AM Certified in Neurology, Neurophysiology and Neuroimaging  [facility] [address] [place], [place] [address] [phone]

## 2020-03-14 ENCOUNTER — Telehealth: Payer: Self-pay | Admitting: Student

## 2020-03-14 NOTE — Telephone Encounter (Signed)
Steven Williams is calling requesting his lab results. Please advise.

## 2020-03-14 NOTE — Telephone Encounter (Signed)
The patient has been notified of the result and verbalized understanding.  All questions (if any) were answered. Steven Iba, RN 03/14/2020 4:45 PM  Patient has requested that results be sent to PCP. He will also give them a call to follow up on.

## 2020-03-15 ENCOUNTER — Ambulatory Visit (HOSPITAL_COMMUNITY)
Admission: RE | Admit: 2020-03-15 | Discharge: 2020-03-15 | Disposition: A | Discharge status: Home / Self Care | Payer: Medicare Other | Source: Ambulatory Visit | Attending: Internal Medicine | Admitting: Internal Medicine

## 2020-03-15 ENCOUNTER — Encounter (HOSPITAL_COMMUNITY): Payer: Self-pay

## 2020-03-15 ENCOUNTER — Ambulatory Visit (HOSPITAL_COMMUNITY): Admission: RE | Admit: 2020-03-15 | Payer: Medicare Other | Source: Ambulatory Visit

## 2020-03-15 ENCOUNTER — Other Ambulatory Visit: Payer: Self-pay

## 2020-03-15 ENCOUNTER — Encounter (HOSPITAL_COMMUNITY): Payer: Medicare Other

## 2020-03-15 DIAGNOSIS — R05 Cough: Secondary | ICD-10-CM | POA: Insufficient documentation

## 2020-03-15 DIAGNOSIS — R059 Cough, unspecified: Secondary | ICD-10-CM

## 2020-03-15 DIAGNOSIS — R1310 Dysphagia, unspecified: Secondary | ICD-10-CM | POA: Diagnosis not present

## 2020-03-20 ENCOUNTER — Encounter (HOSPITAL_COMMUNITY): Payer: Medicare Other

## 2020-03-20 ENCOUNTER — Ambulatory Visit (HOSPITAL_COMMUNITY): Payer: Medicare Other

## 2020-03-20 ENCOUNTER — Encounter (HOSPITAL_COMMUNITY): Payer: Self-pay

## 2020-03-25 ENCOUNTER — Ambulatory Visit (INDEPENDENT_AMBULATORY_CARE_PROVIDER_SITE_OTHER): Payer: Medicare Other | Admitting: *Deleted

## 2020-03-25 DIAGNOSIS — R001 Bradycardia, unspecified: Secondary | ICD-10-CM

## 2020-03-25 LAB — CUP PACEART REMOTE DEVICE CHECK
Battery Impedance: 2200 Ohm
Battery Remaining Longevity: 32 mo
Battery Voltage: 2.74 V
Brady Statistic RV Percent Paced: 21 %
Date Time Interrogation Session: 20210719084924
Implantable Lead Implant Date: 20041008
Implantable Lead Implant Date: 20041008
Implantable Lead Location: 753859
Implantable Lead Location: 753860
Implantable Lead Model: 5076
Implantable Lead Model: 5076
Implantable Pulse Generator Implant Date: 20130328
Lead Channel Impedance Value: 67 Ohm
Lead Channel Impedance Value: 801 Ohm
Lead Channel Setting Pacing Amplitude: 2.5 V
Lead Channel Setting Pacing Pulse Width: 1 ms
Lead Channel Setting Sensing Sensitivity: 2.8 mV

## 2020-03-27 NOTE — Progress Notes (Signed)
Remote pacemaker transmission.   

## 2020-03-28 ENCOUNTER — Other Ambulatory Visit: Payer: Self-pay | Admitting: Thoracic Surgery (Cardiothoracic Vascular Surgery)

## 2020-03-28 DIAGNOSIS — I712 Thoracic aortic aneurysm, without rupture, unspecified: Secondary | ICD-10-CM

## 2020-03-29 NOTE — Progress Notes (Signed)
[facility] Note  04/01/2020     CHIEF COMPLAINT Patient presents for Retina Follow Up   HISTORY OF PRESENT ILLNESS: Steven Williams is a 86 y.o. male who presents to the clinic today for:   HPI    Retina Follow Up    Patient presents with  Wet AMD.  In left eye.  This started months ago.  Severity is moderate.  Duration of 6 weeks.  Since onset it is stable.  I, the attending physician,  performed the HPI with the patient and updated documentation appropriately.          Comments    86 y/o male pt here for 6 wk f/u for exu ARMD OS.  OS may be slightly worse since last visit.  Denies pain, FOL, floaters.  AT prn OU.       Last edited by [name], MD on 04/01/2020  9:56 PM. (History)      Referring physician:   HISTORICAL INFORMATION:   Selected notes from the MEDICAL RECORD NUMBER Referred by [name], [place]    CURRENT MEDICATIONS: No current outpatient medications on file. (Ophthalmic Drugs)   No current facility-administered medications for this visit. (Ophthalmic Drugs)   Current Outpatient Medications (Other)  Medication Sig  . allopurinol (ZYLOPRIM) 100 MG tablet TK 1 T PO D  . carvedilol (COREG) 6.25 MG tablet Take 6.25 mg by mouth 2 (two) times daily.  . Cholecalciferol (VITAMIN D3) 50 MCG (2000 UT) TABS Take 1 tablet by mouth 2 (two) times daily.  Marland Kitchen dicyclomine (BENTYL) 10 MG capsule Take 10 mg by mouth 4 (four) times daily.  . digoxin (LANOXIN) 0.125 MG tablet Take 0.125 mg by mouth as directed. Patient takes medication on M/W/F  . Ferrous Sulfate (IRON) 325 (65 Fe) MG TABS Take 1 tablet by mouth 2 (two) times daily.  . furosemide (LASIX) 40 MG tablet Take 40 mg by mouth daily.  Marland Kitchen gabapentin (NEURONTIN) 400 MG capsule Take 400 mg by mouth at bedtime. (Patient not taking: Reported on 03/13/2020)  . levothyroxine (SYNTHROID, LEVOTHROID) 88 MCG tablet Take 88 mcg by mouth daily before breakfast.  . Misc Natural Products  (GLUCOSAMINE CHOND COMPLEX/MSM PO) Take 1 tablet by mouth daily.  . Multiple Vitamins-Minerals (PRESERVISION AREDS PO) Take 1 tablet by mouth 2 (two) times daily.  . NON FORMULARY Antifungal nail cream from [place] - faxed 03/27/2019  [facility]  . pregabalin (LYRICA) 75 MG capsule Take 75 mg by mouth 2 (two) times daily.  Marland Kitchen rOPINIRole (REQUIP) 2 MG tablet Take 2 mg by mouth at bedtime.  . rosuvastatin (CRESTOR) 10 MG tablet Take 10 mg by mouth daily.  . saw palmetto 500 MG capsule Take 500 mg by mouth 2 (two) times daily.  . tamsulosin (FLOMAX) 0.4 MG CAPS capsule Take 0.4 mg by mouth daily.  . temazepam (RESTORIL) 15 MG capsule Take 15 mg by mouth at bedtime as needed for sleep. (Patient not taking: Reported on 03/13/2020)  . warfarin (COUMADIN) 2 MG tablet Take 2 mg by mouth daily.   Current Facility-Administered Medications (Other)  Medication Route  . Bevacizumab (AVASTIN) SOLN 1.25 mg Intravitreal      REVIEW OF SYSTEMS: ROS    Positive for: Gastrointestinal, Neurological, Musculoskeletal, Cardiovascular, Eyes, Respiratory   Negative for: Constitutional, Skin, Genitourinary, HENT, Endocrine, Psychiatric, Allergic/Imm, Heme/Lymph   Last edited by Matthew Folks, COA on 04/01/2020  1:40 PM. (History)       ALLERGIES  Allergies  Allergen Reactions  . Iodinated Diagnostic Agents Rash    PAST MEDICAL HISTORY Past Medical History:  Diagnosis Date  . Antiphospholipid antibody positive   . Aortic aneurysm [facility])    thoracic, Dr Roxan Hockey  . Aortic dissection ([place])   . BPH (benign prostatic hyperplasia)   . Central retinal artery occlusion   . Chronic anemia   . Chronic renal insufficiency   . COPD (chronic obstructive pulmonary disease) ([place])   . DDD (degenerative disc disease), cervical   . DJD (degenerative joint disease)   . DVT (deep venous thrombosis) ([place])   . Gastric dysmotility   . GERD (gastroesophageal reflux disease)   . Gout   . HTN (hypertension)    . Hyperlipidemia   . Hypertensive retinopathy    OU  . Hypothyroid   . Macular degeneration   . Myelodysplasia (myelodysplastic syndrome) ([place])   . Paget's disease of bone   . Peripheral neuropathy   . Permanent atrial fibrillation ([place])   . Rheumatoid arthritis ([place])   . Symptomatic bradycardia   . TIA (transient ischemic attack)    Past Surgical History:  Procedure Laterality Date  . ATRIAL FIBRILLATION ABLATION  09/23/2014   Dr Encarnacion Chu in [place]  . BACK SURGERY     lumbar, x 2  . CAROTID ENDARTERECTOMY Right 1990  . CAROTID STENT    . CATARACT EXTRACTION Bilateral 2010   [place]  . EYE SURGERY Bilateral 2010   [facility]  . LUNG DECORTICATION     VATS  . PACEMAKER GENERATOR CHANGE  12/03/2011   MDT [facility] [id number] generator change by Dr Encarnacion Chu for symptomatic bradycardia  . PACEMAKER IMPLANT  2004   MDT     FAMILY HISTORY Family History  Problem Relation Age of Onset  . Cirrhosis Mother   . Stroke Father   . Lung cancer Sister   . Lung cancer Brother     SOCIAL HISTORY Social History   Tobacco Use  . Smoking status: Former Smoker    Packs/day: 2.00    Years: 12.00    Pack years: 24.00    Types: Cigarettes    Quit date: 09/07/1962    Years since quitting: 57.6  . Smokeless tobacco: Never Used  Vaping Use  . Vaping Use: Never used  Substance Use Topics  . Alcohol use: Yes    Alcohol/week: 2.0 standard drinks    Types: 2 Glasses of wine per week    Comment: daily  . Drug use: Never         OPHTHALMIC EXAM:  Base Eye Exam    Visual Acuity (Snellen - Linear)      Right Left   Dist cc CF @ 3' 20/70 -2   Dist ph cc NI 20/50 -2   Correction: Glasses       Tonometry (Tonopen, 1:42 PM)      Right Left   Pressure 11 12       Pupils      Dark Light Shape React APD   Right 2 1 Round Minimal None   Left 2 1 Round Minimal None       Visual Fields (Counting fingers)      Left Right    Full Full       Extraocular Movement      Right Left     Full, Ortho Full, Ortho       Neuro/Psych    Oriented x3: Yes   Mood/Affect: Normal  Dilation    Both eyes: 1.0% Mydriacyl, 2.5% Phenylephrine @ 1:42 PM        Slit Lamp and Fundus Exam    Slit Lamp Exam      Right Left   Lids/Lashes Dermatochalasis - upper lid, Meibomian gland dysfunction Dermatochalasis - upper lid, Meibomian gland dysfunction   Conjunctiva/Sclera White and quiet White and quiet   Cornea Arcus, 2-3+ Punctate epithelial erosions, Well healed cataract wounds Arcus, 1-2+ Punctate epithelial erosions centrally, Well healed cataract wounds, mild endopigment   Anterior Chamber Deep and quiet Deep and quiet   Iris Round and moderately dilated Round and moderately dilated   Lens Posterior chamber intraocular lens, 1+ non-central Posterior capsular opacification Posterior chamber intraocular lens, open PC   Vitreous Vitreous syneresis, Posterior vitreous detachment Vitreous syneresis       Fundus Exam      Right Left   Disc +cupping, trace pallor, thin superior rim, 360 PPA mild pallor, 360 PPA, +cupping   C/D Ratio 0.7 0.7   Macula Flat, diffuse RPE atrophy, central pigment clumping, No heme or edema Flat, Blunted foveal reflex, Drusen, RPE mottling, clumping and atrophy, No heme or edema, trace cystic changes, peripapillary GA nasal macula   Vessels Vascular attenuation Vascular attenuation, Tortuous   Periphery Attached, mild reticular degeneration, mild paving stone inferiorly Attached, mild reticular degeneration          IMAGING AND PROCEDURES  Imaging and Procedures for @TODAY @  OCT, Retina - OU - Both Eyes       Right Eye Quality was good. Central Foveal Thickness: 295. Progression has been stable. Findings include abnormal foveal contour, no IRF, no SRF, subretinal hyper-reflective material, retinal drusen , outer retinal atrophy, pigment epithelial detachment.   Left Eye Quality was good. Central Foveal Thickness: 256. Progression has been  stable. Findings include abnormal foveal contour, no SRF, outer retinal atrophy, retinal drusen , pigment epithelial detachment, subretinal hyper-reflective material, no IRF (Stable improvement in cystic changes ).   Notes *Images captured and stored on drive  Diagnosis / Impression:  Exudative ARMD OU OD: +atrophic macular scar - stable OS: Stable improvement in cystic changes   Clinical management:  See below  Abbreviations: NFP - Normal foveal profile. CME - cystoid macular edema. PED - pigment epithelial detachment. IRF - intraretinal fluid. SRF - subretinal fluid. EZ - ellipsoid zone. ERM - epiretinal membrane. ORA - outer retinal atrophy. ORT - outer retinal tubulation. SRHM - subretinal hyper-reflective material        Intravitreal Injection, Pharmacologic Agent - OS - Left Eye       Time Out 04/01/2020. 3:37 PM. Confirmed correct patient, procedure, site, and patient consented.   Anesthesia Topical anesthesia was used. Anesthetic medications included Lidocaine 2%, Proparacaine 0.5%.   Procedure Preparation included 5% betadine to ocular surface, eyelid speculum. A (32g) needle was used.   Injection:  2 mg aflibercept Alfonse Flavors) SOLN   NDC: A3590391, Lot: [id number], Expiration date: 07/07/2020   Route: Intravitreal, Site: Left Eye, Waste: 0.05 mL  Post-op Post injection exam found visual acuity of at least counting fingers. The patient tolerated the procedure well. There were no complications. The patient received written and verbal post procedure care education.                 ASSESSMENT/PLAN:    ICD-10-CM   1. Exudative age-related macular degeneration of left eye with active choroidal neovascularization (HCC)  H35.3221 Intravitreal Injection, Pharmacologic Agent - OS - Left Eye  aflibercept (EYLEA) SOLN 2 mg  2. Exudative age-related macular degeneration of right eye with inactive scar ([place])  H35.3213   3. Retinal edema  H35.81 OCT, Retina - OU -  Both Eyes  4. Essential hypertension  I10   5. Hypertensive retinopathy of both eyes  H35.033   6. Pseudophakia of both eyes  Z96.1     1-3. 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    OD- w/ inactive scar -- history of low vision, CF 3'  OS- w/ CNV   - history of multiple IVL OS in [place], [place] w/ Dr. Doristine Church -- last injection 06/2018, q4-6 wk interval  - moved to [place] in November 2019 and has been followed by Dr. Baird Cancer who had recommended observation, thus pt sought second opinion  - given functional monocular status and history of active CNV, has been receiving maintenance injections, s/p IVA #1 OS (02.25.20), #2 (04.07.20), #3 (05.05.20), #4 (06.22.20), #5 (08.03.20), #6 (03.22.21)  - s/p IVE OS #1 (09.14.20), #2 (10.23.20), #3 (11.30.20), #4 (01.11.21), #5 (02.15.21), #6 (03.22.21), #7 (05.03.21), #8 (6.16.21)  - OCT shows PEDs, CNV w/ stable improvement in cystic changes OS; OD with inactive scar  - BCVA 20/50 OS -- stable from prior  - recommend IVE #9 OS today, 07.26.21, w/ f/u in 6 wks -- pt prefers aggressive maintenance given functional monocular stutus  - pt wishes to be treated with IVE OS  - RBA of procedure discussed, questions answered  - informed consent obtained   - Eylea informed consent form signed and scanned on 01.11.2021  - see procedure note  - Eylea4U benefits investigation started 9.14.20 -- approved for 2021  - f/u in 6 wks -- DFE, OCT, likely injection  4,5. Hypertensive retinopathy OU  - discussed importance of tight BP control  - monitor  6. Pseudophakia OU  - s/p CE/IOL OU  - monitor  Ophthalmic Meds Ordered this visit:  Meds ordered this encounter  Medications  . aflibercept (EYLEA) SOLN 2 mg       Return in about 6 weeks (around 05/13/2020) for 6 wk f/u for exu ARMD OU w/DFE&OCT.  There are no Patient Instructions on file for this visit.   Explained the diagnoses, plan, and follow up with the patient and they expressed  understanding.  Patient expressed understanding of the importance of proper follow up care.   This document serves as a record of services personally performed by Gardiner Sleeper, MD, PhD. It was created on their behalf by Estill Bakes, COT an ophthalmic technician. The creation of this record is the provider's dictation and/or activities during the visit.    Electronically signed by: Estill Bakes, COT 03/29/20 @ 10:06 PM   This document serves as a record of services personally performed by Gardiner Sleeper, MD, PhD. It was created on their behalf by Estill Bakes, COT an ophthalmic technician. The creation of this record is the provider's dictation and/or activities during the visit.    Electronically signed by: Estill Bakes, COT 04/01/20 @ 10:06 PM  Gardiner Sleeper, M.D., Ph.D. Diseases & Surgery of the Retina and [place] 04/01/2020   I have reviewed the above documentation for accuracy and completeness, and I agree with the above. Gardiner Sleeper, M.D., Ph.D. 04/01/20 10:06 PM   Abbreviations: M myopia (nearsighted); A astigmatism; H hyperopia (farsighted); P presbyopia; Mrx spectacle prescription;  CTL contact lenses; OD right eye; OS left eye; OU both eyes  XT exotropia; ET esotropia; PEK punctate epithelial keratitis; PEE punctate epithelial erosions; DES dry eye syndrome; MGD meibomian gland dysfunction; ATs artificial tears; PFAT's preservative free artificial tears; [place] nuclear sclerotic cataract; PSC posterior subcapsular cataract; ERM epi-retinal membrane; PVD posterior vitreous detachment; RD retinal detachment; DM diabetes mellitus; DR diabetic retinopathy; NPDR non-proliferative diabetic retinopathy; PDR proliferative diabetic retinopathy; CSME clinically significant macular edema; DME diabetic macular edema; dbh dot blot hemorrhages; CWS cotton wool spot; POAG primary open angle glaucoma; C/D cup-to-disc ratio; HVF humphrey visual field; GVF  goldmann visual field; OCT optical coherence tomography; IOP intraocular pressure; BRVO Branch retinal vein occlusion; CRVO central retinal vein occlusion; CRAO central retinal artery occlusion; BRAO branch retinal artery occlusion; RT retinal tear; SB scleral buckle; PPV pars plana vitrectomy; VH Vitreous hemorrhage; PRP panretinal laser photocoagulation; IVK intravitreal kenalog; VMT vitreomacular traction; MH Macular hole;  NVD neovascularization of the disc; NVE neovascularization elsewhere; AREDS age related eye disease study; ARMD age related macular degeneration; POAG primary open angle glaucoma; EBMD epithelial/anterior basement membrane dystrophy; ACIOL anterior chamber intraocular lens; IOL intraocular lens; PCIOL posterior chamber intraocular lens; Phaco/IOL phacoemulsification with intraocular lens placement; [place] photorefractive keratectomy; LASIK laser assisted in situ keratomileusis; HTN hypertension; DM diabetes mellitus; COPD chronic obstructive pulmonary disease

## 2020-04-01 ENCOUNTER — Other Ambulatory Visit: Payer: Self-pay

## 2020-04-01 ENCOUNTER — Ambulatory Visit (INDEPENDENT_AMBULATORY_CARE_PROVIDER_SITE_OTHER): Payer: Medicare Other | Admitting: Ophthalmology

## 2020-04-01 ENCOUNTER — Encounter (INDEPENDENT_AMBULATORY_CARE_PROVIDER_SITE_OTHER): Payer: Self-pay | Admitting: Ophthalmology

## 2020-04-01 DIAGNOSIS — H353221 Exudative age-related macular degeneration, left eye, with active choroidal neovascularization: Secondary | ICD-10-CM | POA: Diagnosis not present

## 2020-04-01 DIAGNOSIS — H353213 Exudative age-related macular degeneration, right eye, with inactive scar: Secondary | ICD-10-CM

## 2020-04-01 DIAGNOSIS — I10 Essential (primary) hypertension: Secondary | ICD-10-CM

## 2020-04-01 DIAGNOSIS — H3581 Retinal edema: Secondary | ICD-10-CM | POA: Diagnosis not present

## 2020-04-01 DIAGNOSIS — H35033 Hypertensive retinopathy, bilateral: Secondary | ICD-10-CM

## 2020-04-01 DIAGNOSIS — Z961 Presence of intraocular lens: Secondary | ICD-10-CM

## 2020-04-01 MED ORDER — AFLIBERCEPT 2MG/0.05ML IZ SOLN FOR KALEIDOSCOPE
2.0000 mg | INTRAVITREAL | Status: AC | PRN
  Administered 2020-04-01: 2 mg via INTRAVITREAL

## 2020-04-08 ENCOUNTER — Encounter: Payer: Self-pay | Admitting: Podiatry

## 2020-04-08 ENCOUNTER — Ambulatory Visit (INDEPENDENT_AMBULATORY_CARE_PROVIDER_SITE_OTHER): Payer: Medicare Other | Admitting: Podiatry

## 2020-04-08 ENCOUNTER — Other Ambulatory Visit: Payer: Self-pay

## 2020-04-08 DIAGNOSIS — I739 Peripheral vascular disease, unspecified: Secondary | ICD-10-CM

## 2020-04-08 DIAGNOSIS — Z7901 Long term (current) use of anticoagulants: Secondary | ICD-10-CM

## 2020-04-08 DIAGNOSIS — M79674 Pain in right toe(s): Secondary | ICD-10-CM | POA: Diagnosis not present

## 2020-04-08 DIAGNOSIS — M79675 Pain in left toe(s): Secondary | ICD-10-CM

## 2020-04-08 DIAGNOSIS — B351 Tinea unguium: Secondary | ICD-10-CM | POA: Diagnosis not present

## 2020-04-09 NOTE — Progress Notes (Signed)
Subjective: Steven Williams is a 86 y.o. y.o. male who is on long term blood thinner, Coumadin, presents today with painful, discolored, thick toenails  which interfere with daily activities. Pain is aggravated when wearing enclosed shoe gear. Pain is relieved with periodic professional debridement.  Medications reviewed in chart.  Steven Williams voices no new pedal problems on today's visit.  Allergies  Allergen Reactions  . Iodinated Diagnostic Agents Rash     Objective: There were no vitals filed for this visit.  Vascular Examination: Capillary refill time to digits <4 seconds b/l.  Dorsalis pedis pulses nonpalpable b/l.  Posterior tibial pulses nonpalpable b/l.  Digital hair absent b/l.  Skin temperature gradient warm to cool b/l.  Dermatological Examination: Pedal skin is thin, shiny and atrophic b/l.  Toenails 1-5 b/l discolored, thick, dystrophic with subungual debris and pain with palpation to nailbeds due to thickness of nails.  No open wounds.    No interdigital macerations.  Callus submet head 1 right foot resolved.  Musculoskeletal: Muscle strength 5/5 to all LE muscle groups.  No gross bony deformities b/l.   No pain, crepitus or joint discomfort with active/passive ROM.  Neurological: Sensation intact 5/5 b/l with 10 gram monofilament.  Vibratory sensation intact b/l.  Assessment: Painful onychomycosis toenails 1-5 b/l in patient on blood thinner.  PAD  Plan: 1. Toenails 1-5 b/l were debrided in length and girth without incident. 2. Patient to continue soft, supportive shoe gear daily. 3. Patient to report any pedal injuries to medical professional immediately. 4. Avoid self trimming due to use of blood thinner. 5. Follow up 3 months.  6. Patient/POA to call should there be a concern in the interim.

## 2020-04-19 ENCOUNTER — Telehealth: Payer: Self-pay

## 2020-04-23 ENCOUNTER — Telehealth: Payer: Self-pay

## 2020-04-23 ENCOUNTER — Telehealth: Payer: Self-pay | Admitting: Nurse Practitioner

## 2020-04-23 ENCOUNTER — Ambulatory Visit (HOSPITAL_COMMUNITY)
Admission: RE | Admit: 2020-04-23 | Discharge: 2020-04-23 | Disposition: A | Discharge status: Home / Self Care | Payer: Medicare Other | Source: Ambulatory Visit | Attending: Internal Medicine | Admitting: Internal Medicine

## 2020-04-23 ENCOUNTER — Other Ambulatory Visit (HOSPITAL_COMMUNITY): Payer: Self-pay | Admitting: Internal Medicine

## 2020-04-23 ENCOUNTER — Other Ambulatory Visit: Payer: Self-pay | Admitting: Internal Medicine

## 2020-04-23 ENCOUNTER — Other Ambulatory Visit: Payer: Self-pay

## 2020-04-23 DIAGNOSIS — R1084 Generalized abdominal pain: Secondary | ICD-10-CM

## 2020-04-23 DIAGNOSIS — T508X5A Adverse effect of diagnostic agents, initial encounter: Secondary | ICD-10-CM

## 2020-04-23 MED ORDER — PREDNISONE 50 MG PO TABS: ORAL_TABLET | ORAL | 0 refills | Status: DC

## 2020-04-23 NOTE — Telephone Encounter (Signed)
Phone call to patient to review instructions for 13 hr prep for CT w/ contrast on 8/19 at 1040. Prescription called into  [facility]. Pt aware and verbalized understanding of instructions. Prescription: 2140- 50mg  Prednisone 0340- 50mg  Prednisone 0940- 50mg  Prednisone and 50mg  Benadryl

## 2020-04-23 NOTE — Telephone Encounter (Signed)
Contrast dye pre-medication called into pharm. Pt is aware

## 2020-04-24 ENCOUNTER — Other Ambulatory Visit: Payer: Self-pay | Admitting: Internal Medicine

## 2020-04-24 DIAGNOSIS — R1084 Generalized abdominal pain: Secondary | ICD-10-CM

## 2020-04-25 ENCOUNTER — Inpatient Hospital Stay: Admission: RE | Admit: 2020-04-25 | Payer: Medicare Other | Source: Ambulatory Visit

## 2020-04-25 ENCOUNTER — Other Ambulatory Visit: Payer: Self-pay

## 2020-04-25 ENCOUNTER — Other Ambulatory Visit: Payer: Medicare Other

## 2020-04-25 DIAGNOSIS — T508X5A Adverse effect of diagnostic agents, initial encounter: Secondary | ICD-10-CM

## 2020-04-25 MED ORDER — DIPHENHYDRAMINE HCL 50 MG PO TABS
50.0000 mg | ORAL_TABLET | Freq: Once | ORAL | 0 refills | Status: DC
Start: 2020-04-25 — End: 2020-09-09

## 2020-04-25 MED ORDER — PREDNISONE 50 MG PO TABS: ORAL_TABLET | ORAL | 0 refills | Status: DC

## 2020-04-25 NOTE — Progress Notes (Signed)
CT scan down today at [place] when patient went to get his CT scan for appointment with Dr. Roxan Hockey 04/30/20.  Called in premedication again for patient to requested pharmacy.

## 2020-04-26 ENCOUNTER — Ambulatory Visit (HOSPITAL_COMMUNITY)
Admission: RE | Admit: 2020-04-26 | Discharge: 2020-04-26 | Disposition: A | Discharge status: Home / Self Care | Payer: Medicare Other | Source: Ambulatory Visit | Attending: Internal Medicine | Admitting: Internal Medicine

## 2020-04-26 DIAGNOSIS — R1084 Generalized abdominal pain: Secondary | ICD-10-CM | POA: Insufficient documentation

## 2020-04-30 ENCOUNTER — Ambulatory Visit: Payer: Medicare Other | Admitting: Thoracic Surgery (Cardiothoracic Vascular Surgery)

## 2020-05-02 ENCOUNTER — Inpatient Hospital Stay: Admission: RE | Admit: 2020-05-02 | Payer: Medicare Other | Source: Ambulatory Visit

## 2020-05-02 ENCOUNTER — Telehealth: Payer: Self-pay | Admitting: Nurse Practitioner

## 2020-05-02 NOTE — Telephone Encounter (Signed)
Phone call to patient to review instructions for 13 hr prep for CT w/ contrast on 05/08/20 at 1420. Prescription called into [facility]. Pt aware and verbalized understanding of instructions. Prescription: 0120- 50mg  Prednisone 0720- 50mg  Prednisone 1320- 50mg  Prednisone and 50mg  Benadryl

## 2020-05-08 ENCOUNTER — Ambulatory Visit
Admission: RE | Admit: 2020-05-08 | Discharge: 2020-05-08 | Disposition: A | Discharge status: Home / Self Care | Payer: Medicare Other | Source: Ambulatory Visit | Attending: Thoracic Surgery (Cardiothoracic Vascular Surgery) | Admitting: Thoracic Surgery (Cardiothoracic Vascular Surgery)

## 2020-05-08 ENCOUNTER — Other Ambulatory Visit: Payer: Self-pay

## 2020-05-08 DIAGNOSIS — I712 Thoracic aortic aneurysm, without rupture, unspecified: Secondary | ICD-10-CM

## 2020-05-08 MED ORDER — IOPAMIDOL (ISOVUE-370) INJECTION 76%
60.0000 mL | Freq: Once | INTRAVENOUS | Status: AC | PRN
  Administered 2020-05-08: 60 mL via INTRAVENOUS

## 2020-05-09 NOTE — Progress Notes (Signed)
[facility] Note  05/14/2020     CHIEF COMPLAINT Patient presents for Retina Follow Up   HISTORY OF PRESENT ILLNESS: Steven Williams is a 86 y.o. male who presents to the clinic today for:   HPI    Retina Follow Up    Patient presents with  Wet AMD.  In both eyes.  This started 6 weeks ago.  Severity is moderate.  I, the attending physician,  performed the HPI with the patient and updated documentation appropriately.          Comments    Patient here for 6 weeks retina follow up for exu ARMD OU. Patient states vision doing terrible. Things are fuzzy. Worse with the farsightedness. Reading is a challenge. No eye pain.       Last edited by [name], MD on 05/14/2020  9:15 PM. (History)    pt states, in general, his vision seems "fuzzy"  Referring physician:   HISTORICAL INFORMATION:   Selected notes from the [place] Referred by [name], [place]    CURRENT MEDICATIONS: No current outpatient medications on file. (Ophthalmic Drugs)   No current facility-administered medications for this visit. (Ophthalmic Drugs)   Current Outpatient Medications (Other)  Medication Sig   allopurinol (ZYLOPRIM) 100 MG tablet TK 1 T PO D   carvedilol (COREG) 6.25 MG tablet Take 6.25 mg by mouth 2 (two) times daily.   Cholecalciferol (VITAMIN D3) 50 MCG (2000 UT) TABS Take 1 tablet by mouth 2 (two) times daily.   dicyclomine (BENTYL) 10 MG capsule Take 10 mg by mouth 4 (four) times daily.   digoxin (LANOXIN) 0.125 MG tablet Take 0.125 mg by mouth as directed. Patient takes medication on M/W/F   diphenhydrAMINE (BENADRYL) 50 MG tablet Take 1 tablet (50 mg total) by mouth once for 1 dose. Please take within one hour of CT scan. (Patient not taking: Reported on 05/14/2020)   Ferrous Sulfate (IRON) 325 (65 Fe) MG TABS Take 1 tablet by mouth 2 (two) times daily.   furosemide (LASIX) 40 MG tablet Take 40 mg by mouth daily.   levothyroxine  (SYNTHROID, LEVOTHROID) 88 MCG tablet Take 88 mcg by mouth daily before breakfast.   Misc Natural Products (GLUCOSAMINE CHOND COMPLEX/MSM PO) Take 1 tablet by mouth daily.   Multiple Vitamins-Minerals (PRESERVISION AREDS PO) Take 1 tablet by mouth 2 (two) times daily.   NON FORMULARY Antifungal nail cream from [place] - faxed 03/27/2019  [facility]   predniSONE (DELTASONE) 50 MG tablet Take 50 mg tablet 13 hours prior to CT scan, Take 50 mg tablet 7 hours prior to CT scan Take 50 mg tablet 1 hour prior to CT scan. (Patient not taking: Reported on 05/14/2020)   pregabalin (LYRICA) 75 MG capsule Take 75 mg by mouth 2 (two) times daily.   rOPINIRole (REQUIP) 2 MG tablet Take 2 mg by mouth at bedtime.   rosuvastatin (CRESTOR) 10 MG tablet Take 10 mg by mouth daily.   saw palmetto 500 MG capsule Take 500 mg by mouth 2 (two) times daily.   tamsulosin (FLOMAX) 0.4 MG CAPS capsule Take 0.4 mg by mouth daily.   warfarin (COUMADIN) 2 MG tablet Take 2 mg by mouth daily.   Current Facility-Administered Medications (Other)  Medication Route   Bevacizumab (AVASTIN) SOLN 1.25 mg Intravitreal      REVIEW OF SYSTEMS: ROS    Positive for: Gastrointestinal, Neurological, Musculoskeletal, Cardiovascular, Eyes, Respiratory   Negative for: Constitutional,  Skin, Genitourinary, HENT, Endocrine, Psychiatric, Allergic/Imm, Heme/Lymph   Last edited by [name], COA on 05/14/2020  1:49 PM. (History)       ALLERGIES Allergies  Allergen Reactions   Iodinated Diagnostic Agents Rash    PAST MEDICAL HISTORY Past Medical History:  Diagnosis Date   Antiphospholipid antibody positive    Aortic aneurysm ([place])    thoracic, Dr Roxan Hockey   Aortic dissection [facility])    BPH (benign prostatic hyperplasia)    Central retinal artery occlusion    Chronic anemia    Chronic renal insufficiency    COPD (chronic obstructive pulmonary disease) (HCC)    DDD (degenerative disc disease),  cervical    DJD (degenerative joint disease)    DVT (deep venous thrombosis) (HCC)    Gastric dysmotility    GERD (gastroesophageal reflux disease)    Gout    HTN (hypertension)    Hyperlipidemia    Hypertensive retinopathy    OU   Hypothyroid    Macular degeneration    Myelodysplasia (myelodysplastic syndrome) (HCC)    Paget's disease of bone    Peripheral neuropathy    Permanent atrial fibrillation (HCC)    Rheumatoid arthritis (HCC)    Symptomatic bradycardia    TIA (transient ischemic attack)    Past Surgical History:  Procedure Laterality Date   ATRIAL FIBRILLATION ABLATION  09/23/2014   Dr Encarnacion Chu in [place]     lumbar, x 2   CAROTID ENDARTERECTOMY Right 1990   CAROTID STENT     CATARACT EXTRACTION Bilateral 2010   [place]   EYE SURGERY Bilateral 2010   [facility]   LUNG DECORTICATION     VATS   PACEMAKER GENERATOR CHANGE  12/03/2011   MDT Adapta [id number] generator change by Dr Encarnacion Chu for symptomatic bradycardia   PACEMAKER IMPLANT  2004   MDT     FAMILY HISTORY Family History  Problem Relation Age of Onset   Cirrhosis Mother    Stroke Father    Lung cancer Sister    Lung cancer Brother     SOCIAL HISTORY Social History   Tobacco Use   Smoking status: Former Smoker    Packs/day: 2.00    Years: 12.00    Pack years: 24.00    Types: Cigarettes    Quit date: 09/07/1962    Years since quitting: 57.7   Smokeless tobacco: Never Used  Vaping Use   Vaping Use: Never used  Substance Use Topics   Alcohol use: Yes    Alcohol/week: 2.0 standard drinks    Types: 2 Glasses of wine per week    Comment: daily   Drug use: Never         OPHTHALMIC EXAM:  Base Eye Exam    Visual Acuity (Snellen - Linear)      Right Left   Dist cc CF at 3' 20/40   Dist ph cc NI 20/40 +2   Correction: Glasses       Tonometry (Tonopen, 1:46 PM)      Right Left   Pressure 13 12       Pupils      Dark Light Shape React APD    Right 2 1 Round Minimal None   Left 2 1 Round Minimal None       Visual Fields (Counting fingers)      Left Right    Full Full       Extraocular Movement      Right Left  Full, Ortho Full, Ortho       Neuro/Psych    Oriented x3: Yes   Mood/Affect: Normal       Dilation    Both eyes: 1.0% Mydriacyl, 2.5% Phenylephrine @ 1:46 PM        Slit Lamp and Fundus Exam    Slit Lamp Exam      Right Left   Lids/Lashes Dermatochalasis - upper lid, Meibomian gland dysfunction Dermatochalasis - upper lid, Meibomian gland dysfunction   Conjunctiva/Sclera White and quiet White and quiet   Cornea Arcus, 1+Punctate epithelial erosions, Well healed cataract wounds Arcus, 1-2+inferior Punctate epithelial erosions centrally, Well healed cataract wounds, mild endopigment   Anterior Chamber Deep and quiet Deep and quiet   Iris Round and moderately dilated Round and moderately dilated   Lens Posterior chamber intraocular lens, 1+ non-central Posterior capsular opacification Posterior chamber intraocular lens, open PC   Vitreous Vitreous syneresis, Posterior vitreous detachment Vitreous syneresis       Fundus Exam      Right Left   Disc +cupping, trace pallor, thin superior rim, 360 PPA mild pallor, 360 PPA, +cupping   C/D Ratio 0.7 0.7   Macula Flat, diffuse RPE atrophy, central pigment clumping, No heme or edema Flat, Blunted foveal reflex, Drusen, RPE mottling, clumping and atrophy, No heme or edema, trace cystic changes -- stably improved, peripapillary GA nasal macula   Vessels Vascular attenuation Vascular attenuation, Tortuous   Periphery Attached, mild reticular degeneration, mild paving stone inferiorly Attached, mild reticular degeneration        Refraction    Wearing Rx      Sphere Cylinder Axis Add   Right -1.75 +2.25 005 +2.50   Left -1.50 +2.50 175 +2.50   Type: PAL          IMAGING AND PROCEDURES  Imaging and Procedures for @TODAY @  OCT, Retina - OU - Both Eyes        Right Eye Quality was good. Central Foveal Thickness: 243. Progression has been stable. Findings include abnormal foveal contour, no IRF, no SRF, subretinal hyper-reflective material, retinal drusen , outer retinal atrophy, pigment epithelial detachment.   Left Eye Quality was good. Central Foveal Thickness: 252. Progression has been stable. Findings include abnormal foveal contour, no SRF, outer retinal atrophy, retinal drusen , pigment epithelial detachment, subretinal hyper-reflective material, no IRF (Stable improvement in cystic changes ).   Notes *Images captured and stored on drive  Diagnosis / Impression:  Exudative ARMD OU OD: +atrophic macular scar - stable OS: Stable improvement in cystic changes   Clinical management:  See below  Abbreviations: NFP - Normal foveal profile. CME - cystoid macular edema. PED - pigment epithelial detachment. IRF - intraretinal fluid. SRF - subretinal fluid. EZ - ellipsoid zone. ERM - epiretinal membrane. ORA - outer retinal atrophy. ORT - outer retinal tubulation. SRHM - subretinal hyper-reflective material        Intravitreal Injection, Pharmacologic Agent - OS - Left Eye       Time Out 05/14/2020. 2:34 PM. Confirmed correct patient, procedure, site, and patient consented.   Anesthesia Topical anesthesia was used. Anesthetic medications included Lidocaine 2%, Proparacaine 0.5%.   Procedure Preparation included 5% betadine to ocular surface, eyelid speculum. A (32g) needle was used.   Injection:  2 mg aflibercept Alfonse Flavors) SOLN   NDC: [id number], Lot: [id number], Expiration date: 09/06/2020   Route: Intravitreal, Site: Left Eye, Waste: 0.05 mL  Post-op Post injection exam found visual acuity of at least counting  fingers. The patient tolerated the procedure well. There were no complications. The patient received written and verbal post procedure care education. Post injection medications were not given.                  ASSESSMENT/PLAN:    ICD-10-CM   1. Exudative age-related macular degeneration of left eye with active choroidal neovascularization (HCC)  H35.3221 Intravitreal Injection, Pharmacologic Agent - OS - Left Eye    aflibercept (EYLEA) SOLN 2 mg  2. Exudative age-related macular degeneration of right eye with inactive scar ([place])  H35.3213   3. Retinal edema  H35.81 OCT, Retina - OU - Both Eyes  4. Essential hypertension  I10   5. Hypertensive retinopathy of both eyes  H35.033   6. Pseudophakia of both eyes  Z96.1     1-3. 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    OD- w/ inactive scar -- history of low vision, CF 3'  OS- w/ CNV   - history of multiple IVL OS in [place], [place] w/ Dr. Doristine Church -- last injection 06/2018, q4-6 wk interval  - moved to [place] in November 2019 and has been followed by Dr. Baird Cancer who had recommended observation, thus pt sought second opinion  - given functional monocular status and history of active CNV, has been receiving maintenance injections, s/p IVA #1 OS (02.25.20), #2 (04.07.20), #3 (05.05.20), #4 (06.22.20), #5 (08.03.20), #6 (03.22.21)  - s/p IVE OS #1 (09.14.20), #2 (10.23.20), #3 (11.30.20), #4 (01.11.21), #5 (02.15.21), #6 (03.22.21), #7 (05.03.21), #8 (6.16.21), #9 (7.26.21)  - OCT shows PEDs, CNV w/ stable improvement in cystic changes OS; OD with inactive scar  - BCVA improved to 20/40 from 20/50 OS   - recommend IVE #10 OS today, 09.07.21, w/ f/u in 6 wks -- pt prefers aggressive maintenance given functional monocular stutus  - pt wishes to be treated with IVE OS  - RBA of procedure discussed, questions answered  - informed consent obtained   - Eylea informed consent form signed and scanned on 01.11.2021  - see procedure note  - Eylea4U benefits investigation started 9.14.20 -- approved for 2021  - f/u in 6 wks -- DFE, OCT, likely injection  4,5. Hypertensive retinopathy OU  - discussed importance of tight BP control  -  monitor  6. Pseudophakia OU  - s/p CE/IOL OU  - monitor  Ophthalmic Meds Ordered this visit:  Meds ordered this encounter  Medications   aflibercept (EYLEA) SOLN 2 mg       Return in about 6 weeks (around 06/25/2020) for f/u exu ARMD OU, DFE, OCT.  There are no Patient Instructions on file for this visit.   Explained the diagnoses, plan, and follow up with the patient and they expressed understanding.  Patient expressed understanding of the importance of proper follow up care.   This document serves as a record of services personally performed by Gardiner Sleeper, MD, PhD. It was created on their behalf by Estill Bakes, COT an ophthalmic technician. The creation of this record is the provider's dictation and/or activities during the visit.    Electronically signed by: Estill Bakes, COT 9.2.21 @ 9:48 PM   This document serves as a record of services personally performed by Gardiner Sleeper, MD, PhD. It was created on their behalf by San Jetty. Owens Shark, OA an ophthalmic technician. The creation of this record is the provider's dictation and/or activities during the visit.    Electronically signed by: San Jetty. Owens Shark, OA  09.07.2021 9:48 PM  Gardiner Sleeper, M.D., Ph.D. Diseases & Surgery of the Retina and [place] 05/14/2020   I have reviewed the above documentation for accuracy and completeness, and I agree with the above. Gardiner Sleeper, M.D., Ph.D. 05/14/20 9:48 PM   Abbreviations: M myopia (nearsighted); A astigmatism; H hyperopia (farsighted); P presbyopia; Mrx spectacle prescription;  CTL contact lenses; OD right eye; OS left eye; OU both eyes  XT exotropia; ET esotropia; PEK punctate epithelial keratitis; PEE punctate epithelial erosions; DES dry eye syndrome; MGD meibomian gland dysfunction; ATs artificial tears; PFAT's preservative free artificial tears; [place] nuclear sclerotic cataract; PSC posterior subcapsular cataract; ERM epi-retinal  membrane; PVD posterior vitreous detachment; RD retinal detachment; DM diabetes mellitus; DR diabetic retinopathy; NPDR non-proliferative diabetic retinopathy; PDR proliferative diabetic retinopathy; CSME clinically significant macular edema; DME diabetic macular edema; dbh dot blot hemorrhages; CWS cotton wool spot; POAG primary open angle glaucoma; C/D cup-to-disc ratio; HVF humphrey visual field; GVF goldmann visual field; OCT optical coherence tomography; IOP intraocular pressure; BRVO Branch retinal vein occlusion; CRVO central retinal vein occlusion; CRAO central retinal artery occlusion; BRAO branch retinal artery occlusion; RT retinal tear; SB scleral buckle; PPV pars plana vitrectomy; VH Vitreous hemorrhage; PRP panretinal laser photocoagulation; IVK intravitreal kenalog; VMT vitreomacular traction; MH Macular hole;  NVD neovascularization of the disc; NVE neovascularization elsewhere; AREDS age related eye disease study; ARMD age related macular degeneration; POAG primary open angle glaucoma; EBMD epithelial/anterior basement membrane dystrophy; ACIOL anterior chamber intraocular lens; IOL intraocular lens; PCIOL posterior chamber intraocular lens; Phaco/IOL phacoemulsification with intraocular lens placement; [place] photorefractive keratectomy; LASIK laser assisted in situ keratomileusis; HTN hypertension; DM diabetes mellitus; COPD chronic obstructive pulmonary disease

## 2020-05-14 ENCOUNTER — Encounter: Payer: Self-pay | Admitting: Thoracic Surgery (Cardiothoracic Vascular Surgery)

## 2020-05-14 ENCOUNTER — Ambulatory Visit (INDEPENDENT_AMBULATORY_CARE_PROVIDER_SITE_OTHER): Payer: Medicare Other | Admitting: Thoracic Surgery (Cardiothoracic Vascular Surgery)

## 2020-05-14 ENCOUNTER — Encounter (INDEPENDENT_AMBULATORY_CARE_PROVIDER_SITE_OTHER): Payer: Self-pay | Admitting: Ophthalmology

## 2020-05-14 ENCOUNTER — Ambulatory Visit (INDEPENDENT_AMBULATORY_CARE_PROVIDER_SITE_OTHER): Payer: Medicare Other | Admitting: Ophthalmology

## 2020-05-14 ENCOUNTER — Other Ambulatory Visit: Payer: Self-pay

## 2020-05-14 VITALS — BP 150/78 | HR 58 | Temp 97.6°F | Resp 20 | Ht 73.0 in | Wt 190.0 lb

## 2020-05-14 DIAGNOSIS — H353213 Exudative age-related macular degeneration, right eye, with inactive scar: Secondary | ICD-10-CM

## 2020-05-14 DIAGNOSIS — H353221 Exudative age-related macular degeneration, left eye, with active choroidal neovascularization: Secondary | ICD-10-CM

## 2020-05-14 DIAGNOSIS — H3581 Retinal edema: Secondary | ICD-10-CM

## 2020-05-14 DIAGNOSIS — I712 Thoracic aortic aneurysm, without rupture: Secondary | ICD-10-CM | POA: Diagnosis not present

## 2020-05-14 DIAGNOSIS — Z961 Presence of intraocular lens: Secondary | ICD-10-CM

## 2020-05-14 DIAGNOSIS — I719 Aortic aneurysm of unspecified site, without rupture: Secondary | ICD-10-CM | POA: Diagnosis not present

## 2020-05-14 DIAGNOSIS — Z8679 Personal history of other diseases of the circulatory system: Secondary | ICD-10-CM | POA: Diagnosis not present

## 2020-05-14 DIAGNOSIS — I7121 Aneurysm of the ascending aorta, without rupture: Secondary | ICD-10-CM

## 2020-05-14 DIAGNOSIS — I10 Essential (primary) hypertension: Secondary | ICD-10-CM

## 2020-05-14 DIAGNOSIS — H35033 Hypertensive retinopathy, bilateral: Secondary | ICD-10-CM

## 2020-05-14 MED ORDER — AFLIBERCEPT 2MG/0.05ML IZ SOLN FOR KALEIDOSCOPE
2.0000 mg | INTRAVITREAL | Status: AC | PRN
  Administered 2020-05-14: 2 mg via INTRAVITREAL

## 2020-05-14 NOTE — Progress Notes (Signed)
[address]       [place] [address]             [phone]     HPI: Mr. Stcharles returns for scheduled 83-month follow-up  Heinrich Fertig is an 86 year old man with a history of an aortic dissection, descending thoracic aortic aneurysm, aortic atherosclerosis, hypertension, hyperlipidemia, heart murmur, bradycardia, pacemaker, permanent atrial fibrillation, DVT, Paget's disease, rheumatoid arthritis, TIA, COPD, and chronic kidney disease.  He had an aortic dissection in 2011.  He was [facility] at the time.  He moved to [place] in 2019.  He is being followed for a 4.8 cm descending aneurysm.  I last saw him in February 2021.  He was doing well at that time.  In interim since his last visit he has had some intermittent pain along the left lower rib cage laterally.  This pain is affected by certain movements.  He is not aware of any trauma to that area.  Past Medical History:  Diagnosis Date  . Antiphospholipid antibody positive   . Aortic aneurysm [facility])    thoracic, Dr Roxan Hockey  . Aortic dissection ([place])   . BPH (benign prostatic hyperplasia)   . Central retinal artery occlusion   . Chronic anemia   . Chronic renal insufficiency   . COPD (chronic obstructive pulmonary disease) ([place])   . DDD (degenerative disc disease), cervical   . DJD (degenerative joint disease)   . DVT (deep venous thrombosis) ([place])   . Gastric dysmotility   . GERD (gastroesophageal reflux disease)   . Gout   . HTN (hypertension)   . Hyperlipidemia   . Hypertensive retinopathy    OU  . Hypothyroid   . Macular degeneration   . Myelodysplasia (myelodysplastic syndrome) ([place])   . Paget's disease of bone   . Peripheral neuropathy   . Permanent atrial fibrillation ([place])   . Rheumatoid arthritis ([place])   . Symptomatic bradycardia   . TIA (transient ischemic attack)     Current Outpatient Medications  Medication Sig Dispense Refill  . allopurinol (ZYLOPRIM) 100 MG  tablet TK 1 T PO D    . carvedilol (COREG) 6.25 MG tablet Take 6.25 mg by mouth 2 (two) times daily.    . Cholecalciferol (VITAMIN D3) 50 MCG (2000 UT) TABS Take 1 tablet by mouth 2 (two) times daily.    Marland Kitchen dicyclomine (BENTYL) 10 MG capsule Take 10 mg by mouth 4 (four) times daily.    . digoxin (LANOXIN) 0.125 MG tablet Take 0.125 mg by mouth as directed. Patient takes medication on M/W/F    . Ferrous Sulfate (IRON) 325 (65 Fe) MG TABS Take 1 tablet by mouth 2 (two) times daily.    . furosemide (LASIX) 40 MG tablet Take 40 mg by mouth daily.    Marland Kitchen levothyroxine (SYNTHROID, LEVOTHROID) 88 MCG tablet Take 88 mcg by mouth daily before breakfast.    . Misc Natural Products (GLUCOSAMINE CHOND COMPLEX/MSM PO) Take 1 tablet by mouth daily.    . Multiple Vitamins-Minerals (PRESERVISION AREDS PO) Take 1 tablet by mouth 2 (two) times daily.    . NON FORMULARY Antifungal nail cream from [place] - faxed 03/27/2019  [facility]    . pregabalin (LYRICA) 75 MG capsule Take 75 mg by mouth 2 (two) times daily.    Marland Kitchen rOPINIRole (REQUIP) 2 MG tablet Take 2 mg by mouth at bedtime.    . rosuvastatin (CRESTOR) 10 MG tablet Take 10 mg by mouth  daily.    . saw palmetto 500 MG capsule Take 500 mg by mouth 2 (two) times daily.    . tamsulosin (FLOMAX) 0.4 MG CAPS capsule Take 0.4 mg by mouth daily.    Marland Kitchen warfarin (COUMADIN) 2 MG tablet Take 2 mg by mouth daily.    . diphenhydrAMINE (BENADRYL) 50 MG tablet Take 1 tablet (50 mg total) by mouth once for 1 dose. Please take within one hour of CT scan. (Patient not taking: Reported on 05/14/2020) 1 tablet 0  . predniSONE (DELTASONE) 50 MG tablet Take 50 mg tablet 13 hours prior to CT scan, Take 50 mg tablet 7 hours prior to CT scan Take 50 mg tablet 1 hour prior to CT scan. (Patient not taking: Reported on 05/14/2020) 3 tablet 0   Current Facility-Administered Medications  Medication Dose Route Frequency Provider Last Rate Last Admin  . Bevacizumab (AVASTIN) SOLN 1.25 mg   1.25 mg Intravitreal  [name], MD   1.25 mg at 11/01/18 2343    Physical Exam BP (!) 150/78   Pulse (!) 58   Temp 97.6 F (36.4 C) (Skin)   Resp 20   Ht 6\' 1"  (1.854 m)   Wt 190 lb (86.2 kg)   SpO2 96% Comment: RA  BMI 25.20 kg/m  86 year old man in no acute distress Alert and oriented x3 with no focal deficits Cardiac regular rate and rhythm with a 2/6 systolic murmur heard best left upper sternal border No carotid bruits Lungs clear with equal breath sounds bilaterally Trace peripheral edema  Diagnostic Tests: CT ANGIOGRAPHY CHEST WITH CONTRAST  TECHNIQUE: Multidetector CT imaging of the chest was performed using the standard protocol during bolus administration of intravenous contrast. Multiplanar CT image reconstructions and MIPs were obtained to evaluate the vascular anatomy.  CONTRAST:  60mL ISOVUE-370 IOPAMIDOL (ISOVUE-370) INJECTION 76%  COMPARISON:  10/12/2019, 03/07/2019 and 08/09/2018  FINDINGS: Cardiovascular: Stable aortic root diameter of approximately 3.9 cm. Ascending thoracic aorta measures 3.9 cm. Proximal arch measures 3.6 cm. The distal arch measures 4.0 cm. The proximal descending thoracic aorta measures 4.1 cm. Stable configuration penetrating ulcer and saccular aneurysmal disease of the mid descending thoracic aorta with maximal total aortic diameter of approximately 4.7-4.8 cm at the level of the penetrating ulcer. Size of the ulcer is stable. No evidence of rupture or dissection. Distal descending thoracic aorta measures 3.9 cm. The aorta measures 4.2 cm at the diaphragmatic hiatus.  The heart size is stable. Stable appearance pacemaker leads. Stable coronary artery disease and valvular calcification.  Mediastinum/Nodes: No enlarged mediastinal, hilar, or axillary lymph nodes. Thyroid gland, trachea, and esophagus demonstrate no significant findings.  Lungs/Pleura: Stable chronic lung disease, pulmonary scarring and 3-4 mm  right middle lobe nodule. Stable calcified granuloma at the posterior left lung base.  Upper Abdomen: Calcified granulomata in the spleen and liver.  Musculoskeletal: No chest wall abnormality. No acute or significant osseous findings.  Review of the MIP images confirms the above findings.  IMPRESSION: 1. Stable configuration of penetrating ulcer and saccular aneurysmal disease of the mid descending thoracic aorta with maximal total aortic diameter of approximately 4.7-4.8 cm. No evidence of rupture or dissection. Otherwise stable aneurysmal disease of the distal arch, descending thoracic aorta and visualized proximal abdominal aorta. 2. Stable chronic lung disease, pulmonary scarring and 3-4 mm right middle lobe nodule. 3. Stable coronary artery disease with valvular calcification.  Aortic aneurysm NOS (ICD10-I71.9).   Electronically Signed   By: Aletta Edouard M.D.   On: 05/08/2020  17:46 I personally reviewed the CT images and concur with the findings noted above  Impression: Steven Williams is an 86 year old man with a history of an aortic dissection, descending thoracic aortic aneurysm, aortic atherosclerosis, hypertension, hyperlipidemia, heart murmur, bradycardia, pacemaker, permanent atrial fibrillation, DVT, Paget's disease, rheumatoid arthritis, TIA, COPD, and chronic kidney disease.  Aortic atherosclerosis/descending thoracic aneurysm/penetrating ulcer-CT shows no interval change.  Needs continued semiannual follow-up.  Hypertension-blood pressure slightly elevated at 150.  At home he typically runs in the 100-110 range.  Continue to monitor at home.  Continue current regimen.  Left chest wall pain-likely musculoskeletal in origin.  Not consistent with pain associated with aneurysm.  Plan: Return in 6 months with CT angio of chest  I spent 20 minutes in review of images, records, and in consultation with Mr. Hollibaugh today Melrose Nakayama, MD [facility] [phone]

## 2020-06-24 ENCOUNTER — Ambulatory Visit (INDEPENDENT_AMBULATORY_CARE_PROVIDER_SITE_OTHER): Payer: Medicare Other

## 2020-06-24 DIAGNOSIS — I4821 Permanent atrial fibrillation: Secondary | ICD-10-CM

## 2020-06-25 ENCOUNTER — Other Ambulatory Visit: Payer: Self-pay

## 2020-06-25 ENCOUNTER — Encounter (INDEPENDENT_AMBULATORY_CARE_PROVIDER_SITE_OTHER): Payer: Self-pay | Admitting: Ophthalmology

## 2020-06-25 ENCOUNTER — Ambulatory Visit (INDEPENDENT_AMBULATORY_CARE_PROVIDER_SITE_OTHER): Payer: Medicare Other | Admitting: Ophthalmology

## 2020-06-25 DIAGNOSIS — H353213 Exudative age-related macular degeneration, right eye, with inactive scar: Secondary | ICD-10-CM | POA: Diagnosis not present

## 2020-06-25 DIAGNOSIS — Z961 Presence of intraocular lens: Secondary | ICD-10-CM

## 2020-06-25 DIAGNOSIS — I10 Essential (primary) hypertension: Secondary | ICD-10-CM

## 2020-06-25 DIAGNOSIS — H3581 Retinal edema: Secondary | ICD-10-CM | POA: Diagnosis not present

## 2020-06-25 DIAGNOSIS — H353221 Exudative age-related macular degeneration, left eye, with active choroidal neovascularization: Secondary | ICD-10-CM | POA: Diagnosis not present

## 2020-06-25 DIAGNOSIS — H35033 Hypertensive retinopathy, bilateral: Secondary | ICD-10-CM

## 2020-06-25 LAB — CUP PACEART REMOTE DEVICE CHECK
Battery Impedance: 2286 Ohm
Battery Remaining Longevity: 32 mo
Battery Voltage: 2.74 V
Brady Statistic RV Percent Paced: 18 %
Date Time Interrogation Session: 20211018102841
Implantable Lead Implant Date: 20041008
Implantable Lead Implant Date: 20041008
Implantable Lead Location: 753859
Implantable Lead Location: 753860
Implantable Lead Model: 5076
Implantable Lead Model: 5076
Implantable Pulse Generator Implant Date: 20130328
Lead Channel Impedance Value: 67 Ohm
Lead Channel Impedance Value: 838 Ohm
Lead Channel Setting Pacing Amplitude: 2.5 V
Lead Channel Setting Pacing Pulse Width: 1 ms
Lead Channel Setting Sensing Sensitivity: 2.8 mV

## 2020-06-25 MED ORDER — AFLIBERCEPT 2MG/0.05ML IZ SOLN FOR KALEIDOSCOPE
2.0000 mg | INTRAVITREAL | Status: AC | PRN
  Administered 2020-06-25: 2 mg via INTRAVITREAL

## 2020-06-25 NOTE — Progress Notes (Signed)
[facility] Note  06/25/2020     CHIEF COMPLAINT Patient presents for Retina Follow Up   HISTORY OF PRESENT ILLNESS: Steven Williams is a 86 y.o. male who presents to the clinic today for:   HPI    Retina Follow Up    Patient presents with  Wet AMD.  In both eyes.  Since onset it is stable.  I, the attending physician,  performed the HPI with the patient and updated documentation appropriately.          Comments    6 week follow up Exu Eval OU- pt thinks vision is worse since last visit.  ATs prn.        Last edited by [name], MD on 06/25/2020  4:54 PM. (History)      Referring physician:   HISTORICAL INFORMATION:   Selected notes from the MEDICAL RECORD NUMBER Referred by [name], [place]   CURRENT MEDICATIONS: No current outpatient medications on file. (Ophthalmic Drugs)   No current facility-administered medications for this visit. (Ophthalmic Drugs)   Current Outpatient Medications (Other)  Medication Sig  . allopurinol (ZYLOPRIM) 100 MG tablet TK 1 T PO D  . carvedilol (COREG) 6.25 MG tablet Take 6.25 mg by mouth 2 (two) times daily.  . Cholecalciferol (VITAMIN D3) 50 MCG (2000 UT) TABS Take 1 tablet by mouth 2 (two) times daily.  Marland Kitchen dicyclomine (BENTYL) 10 MG capsule Take 10 mg by mouth 4 (four) times daily.  . digoxin (LANOXIN) 0.125 MG tablet Take 0.125 mg by mouth as directed. Patient takes medication on M/W/F  . Ferrous Sulfate (IRON) 325 (65 Fe) MG TABS Take 1 tablet by mouth 2 (two) times daily.  . furosemide (LASIX) 40 MG tablet Take 40 mg by mouth daily.  Marland Kitchen levothyroxine (SYNTHROID, LEVOTHROID) 88 MCG tablet Take 88 mcg by mouth daily before breakfast.  . Misc Natural Products (GLUCOSAMINE CHOND COMPLEX/MSM PO) Take 1 tablet by mouth daily.  . Multiple Vitamins-Minerals (PRESERVISION AREDS PO) Take 1 tablet by mouth 2 (two) times daily.  . NON FORMULARY Antifungal nail cream from [place] - faxed  03/27/2019  [facility]  . pregabalin (LYRICA) 75 MG capsule Take 75 mg by mouth 2 (two) times daily.  Marland Kitchen rOPINIRole (REQUIP) 2 MG tablet Take 2 mg by mouth at bedtime.  . rosuvastatin (CRESTOR) 10 MG tablet Take 10 mg by mouth daily.  . saw palmetto 500 MG capsule Take 500 mg by mouth 2 (two) times daily.  . tamsulosin (FLOMAX) 0.4 MG CAPS capsule Take 0.4 mg by mouth daily.  Marland Kitchen warfarin (COUMADIN) 2 MG tablet Take 2 mg by mouth daily.  . diphenhydrAMINE (BENADRYL) 50 MG tablet Take 1 tablet (50 mg total) by mouth once for 1 dose. Please take within one hour of CT scan. (Patient not taking: Reported on 05/14/2020)  . predniSONE (DELTASONE) 50 MG tablet Take 50 mg tablet 13 hours prior to CT scan, Take 50 mg tablet 7 hours prior to CT scan Take 50 mg tablet 1 hour prior to CT scan. (Patient not taking: Reported on 05/14/2020)   Current Facility-Administered Medications (Other)  Medication Route  . Bevacizumab (AVASTIN) SOLN 1.25 mg Intravitreal      REVIEW OF SYSTEMS: ROS    Positive for: Gastrointestinal, Neurological, Musculoskeletal, Endocrine, Cardiovascular, Eyes, Respiratory   Negative for: Constitutional, Skin, Genitourinary, HENT, Psychiatric, Allergic/Imm, Heme/Lymph   Last edited by Leonie Douglas, COA on 06/25/2020  1:51 PM. (History)  ALLERGIES Allergies  Allergen Reactions  . Iodinated Diagnostic Agents Rash    PAST MEDICAL HISTORY Past Medical History:  Diagnosis Date  . Antiphospholipid antibody positive   . Aortic aneurysm [facility])    thoracic, Dr Roxan Hockey  . Aortic dissection ([place])   . BPH (benign prostatic hyperplasia)   . Central retinal artery occlusion   . Chronic anemia   . Chronic renal insufficiency   . COPD (chronic obstructive pulmonary disease) ([place])   . DDD (degenerative disc disease), cervical   . DJD (degenerative joint disease)   . DVT (deep venous thrombosis) ([place])   . Gastric dysmotility   . GERD (gastroesophageal reflux disease)   . Gout    . HTN (hypertension)   . Hyperlipidemia   . Hypertensive retinopathy    OU  . Hypothyroid   . Macular degeneration   . Myelodysplasia (myelodysplastic syndrome) ([place])   . Paget's disease of bone   . Peripheral neuropathy   . Permanent atrial fibrillation ([place])   . Rheumatoid arthritis ([place])   . Symptomatic bradycardia   . TIA (transient ischemic attack)    Past Surgical History:  Procedure Laterality Date  . ATRIAL FIBRILLATION ABLATION  09/23/2014   Dr Encarnacion Chu in [place]  . BACK SURGERY     lumbar, x 2  . CAROTID ENDARTERECTOMY Right 1990  . CAROTID STENT    . CAT[place]ACT EXTRACTION Bilateral 2010   [place]  . EYE SURGERY Bilateral 2010   Cat Sx  . LUNG DECORTICATION     VATS  . PACEMAKER GENERATOR CHANGE  12/03/2011   MDT Adapta ADDR01 generator change by Dr Encarnacion Chu for symptomatic bradycardia  . PACEMAKER IMPLANT  2004   MDT     FAMILY HISTORY Family History  Problem Relation Age of Onset  . Cirrhosis Mother   . Stroke Father   . Lung cancer Sister   . Lung cancer Brother     SOCIAL HISTORY Social History   Tobacco Use  . Smoking status: Former Smoker    Packs/day: 2.00    Years: 12.00    Pack years: 24.00    Types: Cigarettes    Quit date: 09/07/1962    Years since quitting: 57.8  . Smokeless tobacco: Never Used  Vaping Use  . Vaping Use: Never used  Substance Use Topics  . Alcohol use: Yes    Alcohol/week: 2.0 standard drinks    Types: 2 Glasses of wine per week    Comment: daily  . Drug use: Never         OPHTHALMIC EXAM:  Base Eye Exam    Visual Acuity (Snellen - Linear)      Right Left   Dist cc CF3' 20/40 +2   Dist ph cc  NI   Correction: Glasses       Tonometry (Tonopen, 2:01 PM)      Right Left   Pressure 12 10       Pupils      Dark Light Shape React APD   Right 2 1 Round Minimal None   Left 2 1 Round Minimal None       Visual Fields (Counting fingers)      Left Right    Full Full       Extraocular Movement       Right Left    Full Full       Neuro/Psych    Oriented x3: Yes   Mood/Affect: Normal       Dilation  Both eyes: 1.0% Mydriacyl, 2.5% Phenylephrine @ 2:01 PM        Slit Lamp and Fundus Exam    Slit Lamp Exam      Right Left   Lids/Lashes Dermatochalasis - upper lid, Meibomian gland dysfunction Dermatochalasis - upper lid, Meibomian gland dysfunction   Conjunctiva/Sclera White and quiet White and quiet   Cornea Arcus, 1+Punctate epithelial erosions, Well healed cataract wounds Arcus, 1-2+inferior Punctate epithelial erosions centrally, Well healed cataract wounds, mild endopigment   Anterior Chamber Deep and quiet Deep and quiet   Iris Round and moderately dilated Round and moderately dilated   Lens Posterior chamber intraocular lens, 1+ non-central Posterior capsular opacification Posterior chamber intraocular lens, open PC   Vitreous Vitreous syneresis, Posterior vitreous detachment Vitreous syneresis       Fundus Exam      Right Left   Disc +cupping, trace pallor, thin superior rim, 360 PPA mild pallor, 360 PPA, +cupping   C/D Ratio 0.7 0.7   Macula Flat, diffuse RPE atrophy, central pigment clumping, No heme or edema Flat, Blunted foveal reflex, Drusen, RPE mottling, clumping and atrophy, No heme or edema, trace cystic changes -- stably improved, peripapillary GA nasal macula   Vessels Vascular attenuation Vascular attenuation, Tortuous   Periphery Attached, mild reticular degeneration, mild paving stone inferiorly Attached, mild reticular degeneration        Refraction    Wearing Rx      Sphere Cylinder Axis Add   Right -1.75 +2.25 005 +2.50   Left -1.50 +2.50 175 +2.50   Type: PAL       Manifest Refraction      Sphere Cylinder Axis Dist VA   Right [place]   NI   Left -1.75 +2.50 175 NI          IMAGING AND PROCEDURES  Imaging and Procedures for @TODAY @  OCT, Retina - OU - Both Eyes       Right Eye Quality was good. Central Foveal Thickness: 270.  Progression has been stable. Findings include abnormal foveal contour, no IRF, no SRF, subretinal hyper-reflective material, retinal drusen , outer retinal atrophy, pigment epithelial detachment.   Left Eye Quality was good. Central Foveal Thickness: 262. Progression has been stable. Findings include abnormal foveal contour, no SRF, outer retinal atrophy, retinal drusen , pigment epithelial detachment, subretinal hyper-reflective material, no IRF (Stable improvement in cystic changes ).   Notes *Images captured and stored on drive  Diagnosis / Impression:  Exudative ARMD OU OD: +atrophic macular scar - stable OS: Stable improvement in cystic changes   Clinical management:  See below  Abbreviations: NFP - Normal foveal profile. CME - cystoid macular edema. PED - pigment epithelial detachment. IRF - intraretinal fluid. SRF - subretinal fluid. EZ - ellipsoid zone. ERM - epiretinal membrane. ORA - outer retinal atrophy. ORT - outer retinal tubulation. SRHM - subretinal hyper-reflective material        Intravitreal Injection, Pharmacologic Agent - OS - Left Eye       Time Out 06/25/2020. 2:45 PM. Confirmed correct patient, procedure, site, and patient consented.   Anesthesia Topical anesthesia was used. Anesthetic medications included Lidocaine 2%, Proparacaine 0.5%.   Procedure Preparation included 5% betadine to ocular surface, eyelid speculum. A (32g) needle was used.   Injection:  2 mg aflibercept Alfonse Flavors) SOLN   NDC: A3590391, Lot: [id number], Expiration date: 10/07/2020   Route: Intravitreal, Site: Left Eye, Waste: 0.05 mL  Post-op Post injection exam found visual acuity of at least  counting fingers. The patient tolerated the procedure well. There were no complications. The patient received written and verbal post procedure care education. Post injection medications were not given.                 ASSESSMENT/PLAN:    ICD-10-CM   1. Exudative age-related macular  degeneration of left eye with active choroidal neovascularization (HCC)  H35.3221 Intravitreal Injection, Pharmacologic Agent - OS - Left Eye    aflibercept (EYLEA) SOLN 2 mg  2. Exudative age-related macular degeneration of right eye with inactive scar ([place])  H35.3213   3. Retinal edema  H35.81 OCT, Retina - OU - Both Eyes  4. Essential hypertension  I10   5. Hypertensive retinopathy of both eyes  H35.033   6. Pseudophakia of both eyes  Z96.1     1-3. 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    OD- w/ inactive scar -- history of low vision, CF 3'  OS- w/ CNV   - history of multiple IVL OS in [place], [place] w/ Dr. Doristine Church -- last injection 06/2018, q4-6 wk interval  - moved to [place] in November 2019 and has been followed by Dr. Baird Cancer who had recommended observation, thus pt sought second opinion  - given functional monocular status and history of active CNV, has been receiving maintenance injections, s/p IVA #1 OS (02.25.20), #2 (04.07.20), #3 (05.05.20), #4 (06.22.20), #5 (08.03.20), #6 (03.22.21)  - s/p IVE OS #1 (09.14.20), #2 (10.23.20), #3 (11.30.20), #4 (01.11.21), #5 (02.15.21), #6 (03.22.21), #7 (05.03.21), #8 (6.16.21), #9 (7.26.21), #10 (9.7.21)  - OCT shows PEDs, CNV w/ stable improvement in cystic changes OS; OD with inactive scar  - BCVA stable at 20/40 OS   - recommend IVE #11 OS today, 10.19.21, maintenance w/ f/u in 6 wks   - pt prefers aggressive maintenance given functional monocular stutus  - pt wishes to be treated with IVE OS  - RBA of procedure discussed, questions answered  - informed consent obtained   - Eylea informed consent form signed and scanned on 01.11.2021  - see procedure note  - Eylea4U benefits investigation started 9.14.20 -- approved for 2021  - f/u in 6 wks -- DFE, OCT, likely injection  4,5. Hypertensive retinopathy OU  - discussed importance of tight BP control  - monitor  6. Pseudophakia OU  - s/p CE/IOL OU  -  monitor  Ophthalmic Meds Ordered this visit:  Meds ordered this encounter  Medications  . aflibercept (EYLEA) SOLN 2 mg       Return in about 6 weeks (around 08/06/2020) for f/u exu ARMD OU, DFE, OCT.  There are no Patient Instructions on file for this visit.   Explained the diagnoses, plan, and follow up with the patient and they expressed understanding.  Patient expressed understanding of the importance of proper follow up care.   This document serves as a record of services personally performed by Gardiner Sleeper, MD, PhD. It was created on their behalf by Estill Bakes, COT an ophthalmic technician. The creation of this record is the provider's dictation and/or activities during the visit.    Electronically signed by: Estill Bakes, COT 10.18.21 @ 4:56 PM   This document serves as a record of services personally performed by Gardiner Sleeper, MD, PhD. It was created on their behalf by [place] Jet[place]. Owens Shark, OA an ophthalmic technician. The creation of this record is the provider's dictation and/or activities during the visit.    Electronically signed by:  San Jetty. Owens Shark, [place] 10.19.2021 4:56 PM  Gardiner Sleeper, M.D., Ph.D. Diseases & Surgery of the Retina and [place] 06/25/2020   I have reviewed the above documentation for accuracy and completeness, and I agree with the above. Gardiner Sleeper, M.D., Ph.D. 06/25/20 4:56 PM   Abbreviations: M myopia (nearsighted); A astigmatism; H hyperopia (farsighted); P presbyopia; Mrx spectacle prescription;  CTL contact lenses; OD right eye; OS left eye; OU both eyes  XT exotropia; ET esotropia; PEK punctate epithelial keratitis; PEE punctate epithelial erosions; DES dry eye syndrome; MGD meibomian gland dysfunction; ATs artificial tears; PFAT's preservative free artificial tears; [place] nuclear sclerotic cataract; PSC posterior subcapsular cataract; ERM epi-retinal membrane; PVD posterior vitreous detachment; RD  retinal detachment; DM diabetes mellitus; DR diabetic retinopathy; NPDR non-proliferative diabetic retinopathy; PDR proliferative diabetic retinopathy; CSME clinically significant macular edema; DME diabetic macular edema; dbh dot blot hemorrhages; CWS cotton wool spot; POAG primary open angle glaucoma; C/D cup-to-disc ratio; HVF humphrey visual field; GVF goldmann visual field; OCT optical coherence tomography; IOP intraocular pressure; BRVO Branch retinal vein occlusion; CRVO central retinal vein occlusion; CRAO central retinal artery occlusion; BRAO branch retinal artery occlusion; RT retinal tear; SB scleral buckle; PPV pars plana vitrectomy; VH Vitreous hemorrhage; PRP panretinal laser photocoagulation; IVK intravitreal kenalog; VMT vitreomacular traction; MH Macular hole;  NVD neovascularization of the disc; NVE neovascularization elsewhere; AREDS age related eye disease study; ARMD age related macular degeneration; POAG primary open angle glaucoma; EBMD epithelial/anterior basement membrane dystrophy; ACIOL anterior chamber intraocular lens; IOL intraocular lens; PCIOL posterior chamber intraocular lens; Phaco/IOL phacoemulsification with intraocular lens placement; [place] photorefractive keratectomy; LASIK laser assisted in situ keratomileusis; HTN hypertension; DM diabetes mellitus; COPD chronic obstructive pulmonary disease

## 2020-06-26 DIAGNOSIS — Z7901 Long term (current) use of anticoagulants: Secondary | ICD-10-CM

## 2020-06-26 DIAGNOSIS — J342 Deviated nasal septum: Secondary | ICD-10-CM | POA: Insufficient documentation

## 2020-06-26 DIAGNOSIS — R040 Epistaxis: Secondary | ICD-10-CM | POA: Insufficient documentation

## 2020-06-26 HISTORY — DX: Deviated nasal septum: J34.2

## 2020-06-26 HISTORY — DX: Long term (current) use of anticoagulants: Z79.01

## 2020-06-26 HISTORY — DX: Epistaxis: R04.0

## 2020-06-27 NOTE — Progress Notes (Signed)
Remote pacemaker transmission.   

## 2020-07-10 ENCOUNTER — Ambulatory Visit (INDEPENDENT_AMBULATORY_CARE_PROVIDER_SITE_OTHER): Payer: Medicare Other | Admitting: Podiatry

## 2020-07-10 ENCOUNTER — Other Ambulatory Visit: Payer: Self-pay

## 2020-07-10 ENCOUNTER — Encounter: Payer: Self-pay | Admitting: Podiatry

## 2020-07-10 DIAGNOSIS — B351 Tinea unguium: Secondary | ICD-10-CM

## 2020-07-10 DIAGNOSIS — M79674 Pain in right toe(s): Secondary | ICD-10-CM | POA: Diagnosis not present

## 2020-07-10 DIAGNOSIS — M79675 Pain in left toe(s): Secondary | ICD-10-CM | POA: Diagnosis not present

## 2020-07-10 DIAGNOSIS — Z7901 Long term (current) use of anticoagulants: Secondary | ICD-10-CM

## 2020-07-10 DIAGNOSIS — I739 Peripheral vascular disease, unspecified: Secondary | ICD-10-CM

## 2020-07-14 NOTE — Progress Notes (Signed)
Subjective:  Patient ID: Steven Williams, male    DOB: [birth year],  MRN: [id number]  Steven Williams presents to clinic today for painful thick toenails that are difficult to trim. Pain interferes with ambulation. Aggravating factors include wearing enclosed shoe gear. Pain is relieved with periodic professional debridement..  86 y.o. male presents with the above complaint.    Review of Systems: Negative except as noted in the HPI. Past Medical History:  Diagnosis Date  . Antiphospholipid antibody positive   . Aortic aneurysm [facility])    thoracic, Dr Roxan Hockey  . Aortic dissection ([place])   . BPH (benign prostatic hyperplasia)   . Central retinal artery occlusion   . Chronic anemia   . Chronic renal insufficiency   . COPD (chronic obstructive pulmonary disease) ([place])   . DDD (degenerative disc disease), cervical   . DJD (degenerative joint disease)   . DVT (deep venous thrombosis) ([place])   . Gastric dysmotility   . GERD (gastroesophageal reflux disease)   . Gout   . HTN (hypertension)   . Hyperlipidemia   . Hypertensive retinopathy    OU  . Hypothyroid   . Macular degeneration   . Myelodysplasia (myelodysplastic syndrome) ([place])   . Paget's disease of bone   . Peripheral neuropathy   . Permanent atrial fibrillation ([place])   . Rheumatoid arthritis ([place])   . Symptomatic bradycardia   . TIA (transient ischemic attack)    Past Surgical History:  Procedure Laterality Date  . ATRIAL FIBRILLATION ABLATION  09/23/2014   Dr Encarnacion Chu in [place]  . BACK SURGERY     lumbar, x 2  . CAROTID ENDARTERECTOMY Right 1990  . CAROTID STENT    . CATARACT EXTRACTION Bilateral 2010   [place]  . EYE SURGERY Bilateral 2010   Cat Sx  . LUNG DECORTICATION     VATS  . PACEMAKER GENERATOR CHANGE  12/03/2011   MDT Adapta ADDR01 generator change by Dr Encarnacion Chu for symptomatic bradycardia  . PACEMAKER IMPLANT  2004   MDT     Current Outpatient Medications:  .  allopurinol (ZYLOPRIM) 100 MG tablet, TK 1  T PO D, Disp: , Rfl:  .  carvedilol (COREG) 6.25 MG tablet, Take 6.25 mg by mouth 2 (two) times daily., Disp: , Rfl:  .  Cholecalciferol (VITAMIN D3) 50 MCG (2000 UT) TABS, Take 1 tablet by mouth 2 (two) times daily., Disp: , Rfl:  .  dicyclomine (BENTYL) 10 MG capsule, Take 10 mg by mouth 4 (four) times daily., Disp: , Rfl:  .  digoxin (LANOXIN) 0.125 MG tablet, Take 0.125 mg by mouth as directed. Patient takes medication on M/W/F, Disp: , Rfl:  .  doxycycline (MONODOX) 100 MG capsule, Take 100 mg by mouth 2 (two) times daily., Disp: , Rfl:  .  Ferrous Sulfate (IRON) 325 (65 Fe) MG TABS, Take 1 tablet by mouth 2 (two) times daily., Disp: , Rfl:  .  furosemide (LASIX) 40 MG tablet, Take 40 mg by mouth daily., Disp: , Rfl:  .  levothyroxine (SYNTHROID) 100 MCG tablet, Take 100 mcg by mouth every morning., Disp: , Rfl:  .  levothyroxine (SYNTHROID, LEVOTHROID) 88 MCG tablet, Take 88 mcg by mouth daily before breakfast., Disp: , Rfl:  .  Misc Natural Products (GLUCOSAMINE CHOND COMPLEX/MSM PO), Take 1 tablet by mouth daily., Disp: , Rfl:  .  Multiple Vitamins-Minerals (PRESERVISION AREDS PO), Take 1 tablet by mouth 2 (two) times daily., Disp: , Rfl:  .  mupirocin ointment (BACTROBAN) 2 %,  3 (three) times daily., Disp: , Rfl:  .  NON FORMULARY, Antifungal nail cream from [place] - faxed 03/27/2019  [facility], Disp: , Rfl:  .  pantoprazole (PROTONIX) 40 MG tablet, Take by mouth., Disp: , Rfl:  .  predniSONE (DELTASONE) 50 MG tablet, Take 50 mg tablet 13 hours prior to CT scan, Take 50 mg tablet 7 hours prior to CT scan Take 50 mg tablet 1 hour prior to CT scan., Disp: 3 tablet, Rfl: 0 .  pregabalin (LYRICA) 75 MG capsule, Take 75 mg by mouth 2 (two) times daily., Disp: , Rfl:  .  rOPINIRole (REQUIP) 2 MG tablet, Take 2 mg by mouth at bedtime., Disp: , Rfl:  .  rosuvastatin (CRESTOR) 10 MG tablet, Take 10 mg by mouth daily., Disp: , Rfl:  .  saw palmetto 500 MG capsule, Take 500 mg by mouth 2  (two) times daily., Disp: , Rfl:  .  silver nitrate applicators 44-03 % applicator, Apply topically., Disp: , Rfl:  .  tamsulosin (FLOMAX) 0.4 MG CAPS capsule, Take 0.4 mg by mouth daily., Disp: , Rfl:  .  warfarin (COUMADIN) 2 MG tablet, Take 2 mg by mouth daily., Disp: , Rfl:  .  diphenhydrAMINE (BENADRYL) 50 MG tablet, Take 1 tablet (50 mg total) by mouth once for 1 dose. Please take within one hour of CT scan. (Patient not taking: Reported on 05/14/2020), Disp: 1 tablet, Rfl: 0  Current Facility-Administered Medications:  .  Bevacizumab (AVASTIN) SOLN 1.25 mg, 1.25 mg, Intravitreal, , [name], MD, 1.25 mg at 11/01/18 2343 Allergies  Allergen Reactions  . Iodinated Diagnostic Agents Rash   Social History   Occupational History  . Occupation: retired    Comment: retired [employer]  Tobacco Use  . Smoking status: Former Smoker    Packs/day: 2.00    Years: 12.00    Pack years: 24.00    Types: Cigarettes    Quit date: 09/07/1962    Years since quitting: 57.8  . Smokeless tobacco: Never Used  Vaping Use  . Vaping Use: Never used  Substance and Sexual Activity  . Alcohol use: Yes    Alcohol/week: 2.0 standard drinks    Types: 2 Glasses of wine per week    Comment: daily  . Drug use: Never  . Sexual activity: Not on file    Objective:   Constitutional Steven Williams is a pleasant 86 y.o. Caucasian male, WD, WN in NAD. AAO x 3.   Vascular Capillary refill time to digits <4 seconds b/l lower extremities. Nonpalpable DP pulse(s) b/l lower extremities. Nonpalpable PT pulse(s) b/l lower extremities. Pedal hair absent. Lower extremity skin temperature gradient warm to cool. No cyanosis or clubbing noted.  Neurologic Normal speech. Oriented to person, place, and time. Protective sensation intact 5/5 intact bilaterally with 10g monofilament b/l. Vibratory sensation intact b/l.  Dermatologic Pedal skin is thin shiny, atrophic b/l lower extremities. No open wounds bilaterally. No interdigital  macerations bilaterally. Toenails 1-5 b/l elongated, discolored, dystrophic, thickened, crumbly with subungual debris and tenderness to dorsal palpation.  Orthopedic: Normal muscle strength 5/5 to all lower extremity muscle groups bilaterally. No pain crepitus or joint limitation noted with ROM b/l. No gross bony deformities bilaterally.   Radiographs: None Assessment:   1. Pain due to onychomycosis of toenails of both feet   2. PAD (peripheral artery disease) ([place])   3. Chronic anticoagulation    Plan:  Patient was evaluated and treated and all questions answered.  Onychomycosis with pain -  Nails palliatively debridement as below -Educated on self-care  Procedure: Nail Debridement Rationale: Pain Type of Debridement: manual, sharp debridement. Instrumentation: Nail nipper, rotary burr. Number of Nails: 10 -Examined patient. -No new findings. No new orders. -Patient to continue soft, supportive shoe gear daily. -Toenails 1-5 b/l were debrided in length and girth with sterile nail nippers and dremel without iatrogenic bleeding.  -Patient to report any pedal injuries to medical professional immediately. -Patient/POA to call should there be question/concern in the interim.  Return in about 3 months (around 10/10/2020).  Marzetta Board, DPM

## 2020-07-23 ENCOUNTER — Telehealth: Payer: Self-pay | Admitting: Internal Medicine

## 2020-07-23 NOTE — Telephone Encounter (Signed)
Called patient back about his message. Patient complaining of BLE edema and SOB that come and goes for about a week. Patient stated the edema goes down some during the night. Patient was wondering if he could just increase his lasix. Will consult, DOD, Dr. Radford Pax for advisement. Made patient an appointment to follow up with [name] PA for evaluation next week, whom he has seen before.   Dr. Radford Pax advised patient to take Lasix 40 mg BID for two days, then go back to 40 mg daily. Also she advised him to see PA in one week and have BMET at office visit.  Patient will call if his symptoms do not improve with the increase lasix.

## 2020-07-23 NOTE — Telephone Encounter (Signed)
  Pt c/o swelling: STAT is pt has developed SOB within 24 hours  1) How much weight have you gained and in what time span? Not sure  2) If swelling, where is the swelling located? Bilateral lower legs, right side hurts when pushed on by nurse, Lesly Rubenstein, RN states +1 pitting edema  3) Are you currently taking a fluid pill? yes  4) Are you currently SOB? No, has had some that comes and goes for the past week  5) Do you have a log of your daily weights (if so, list)? no  6) Have you gained 3 pounds in a day or 5 pounds in a week? Don't think so  7) Have you traveled recently? no

## 2020-07-30 NOTE — Progress Notes (Signed)
Electrophysiology Office Note Date: 07/31/2020  ID:  Steven Williams, DOB 10-30-1929, MRN [id number]  PCP: [name], MD Primary Cardiologist: No primary care provider on file. Electrophysiologist: Thompson Grayer, MD   CC: Pacemaker follow-up  Steven Williams is a 86 y.o. male seen today for Thompson Grayer, MD for acute visit due to edema and SOB.  Since last being seen in our clinic the patient reports doing well. He called and was instructed to take extra lasix x 2 days, which has resolved his symptoms. He has been pushing hydration and drinking 1-2 vitamin waters a day. He had mild orthopnea which has resolved. His SOB with exertion is "almost" back to baseline, and his edema has resolved. he denies chest pain, palpitations, PND, nausea, vomiting, dizziness, syncope, weight gain, or early satiety.  Device History: Medtronic Dual Chamber PPM implanted 11/2011 for symptomatic bradycardia  Past Medical History:  Diagnosis Date  . Antiphospholipid antibody positive   . Aortic aneurysm [facility])    thoracic, Dr Roxan Hockey  . Aortic dissection ([place])   . BPH (benign prostatic hyperplasia)   . Central retinal artery occlusion   . Chronic anemia   . Chronic renal insufficiency   . COPD (chronic obstructive pulmonary disease) ([place])   . DDD (degenerative disc disease), cervical   . DJD (degenerative joint disease)   . DVT (deep venous thrombosis) ([place])   . Gastric dysmotility   . GERD (gastroesophageal reflux disease)   . Gout   . HTN (hypertension)   . Hyperlipidemia   . Hypertensive retinopathy    OU  . Hypothyroid   . Macular degeneration   . Myelodysplasia (myelodysplastic syndrome) ([place])   . Paget's disease of bone   . Peripheral neuropathy   . Permanent atrial fibrillation ([place])   . Rheumatoid arthritis ([place])   . Symptomatic bradycardia   . TIA (transient ischemic attack)    Past Surgical History:  Procedure Laterality Date  . ATRIAL FIBRILLATION ABLATION  09/23/2014   Dr  Encarnacion Chu in [place]  . BACK SURGERY     lumbar, x 2  . CAROTID ENDARTERECTOMY Right 1990  . CAROTID STENT    . CATARACT EXTRACTION Bilateral 2010   [place]  . EYE SURGERY Bilateral 2010   [facility]  . LUNG DECORTICATION     VATS  . PACEMAKER GENERATOR CHANGE  12/03/2011   MDT [place] ADDR01 generator change by Dr Encarnacion Chu for symptomatic bradycardia  . PACEMAKER IMPLANT  2004   MDT     Current Outpatient Medications  Medication Sig Dispense Refill  . allopurinol (ZYLOPRIM) 100 MG tablet TK 1 T PO D    . carvedilol (COREG) 6.25 MG tablet Take 6.25 mg by mouth 2 (two) times daily.    . Cholecalciferol (VITAMIN D3) 50 MCG (2000 UT) TABS Take 1 tablet by mouth 2 (two) times daily.    Marland Kitchen dicyclomine (BENTYL) 10 MG capsule Take 10 mg by mouth 4 (four) times daily.    . digoxin (LANOXIN) 0.125 MG tablet Take 0.125 mg by mouth as directed. Patient takes medication on M/W/F    . doxycycline (MONODOX) 100 MG capsule Take 100 mg by mouth 2 (two) times daily.    . Ferrous Sulfate (IRON) 325 (65 Fe) MG TABS Take 1 tablet by mouth 2 (two) times daily.    . furosemide (LASIX) 40 MG tablet Take 40 mg by mouth daily.    Marland Kitchen levothyroxine (SYNTHROID) 100 MCG tablet Take 100 mcg by mouth every morning.    Marland Kitchen  levothyroxine (SYNTHROID, LEVOTHROID) 88 MCG tablet Take 88 mcg by mouth daily before breakfast.    . Misc Natural Products (GLUCOSAMINE CHOND COMPLEX/MSM PO) Take 1 tablet by mouth daily.    . Multiple Vitamins-Minerals (PRESERVISION AREDS PO) Take 1 tablet by mouth 2 (two) times daily.    . mupirocin ointment (BACTROBAN) 2 % 3 (three) times daily.    . NON FORMULARY Antifungal nail cream from [place] - faxed 03/27/2019  [facility]    . pantoprazole (PROTONIX) 40 MG tablet Take by mouth.    . predniSONE (DELTASONE) 50 MG tablet Take 50 mg tablet 13 hours prior to CT scan, Take 50 mg tablet 7 hours prior to CT scan Take 50 mg tablet 1 hour prior to CT scan. 3 tablet 0  . pregabalin (LYRICA) 75 MG  capsule Take 75 mg by mouth 2 (two) times daily.    Marland Kitchen rOPINIRole (REQUIP) 2 MG tablet Take 2 mg by mouth at bedtime.    . rosuvastatin (CRESTOR) 10 MG tablet Take 10 mg by mouth daily.    . saw palmetto 500 MG capsule Take 500 mg by mouth 2 (two) times daily.    . silver nitrate applicators 94-70 % applicator Apply topically.    . tamsulosin (FLOMAX) 0.4 MG CAPS capsule Take 0.4 mg by mouth daily.    Marland Kitchen warfarin (COUMADIN) 2 MG tablet Take 2 mg by mouth daily.    . diphenhydrAMINE (BENADRYL) 50 MG tablet Take 1 tablet (50 mg total) by mouth once for 1 dose. Please take within one hour of CT scan. (Patient not taking: Reported on 05/14/2020) 1 tablet 0   Current Facility-Administered Medications  Medication Dose Route Frequency Provider Last Rate Last Admin  . Bevacizumab (AVASTIN) SOLN 1.25 mg  1.25 mg Intravitreal  [name], MD   1.25 mg at 11/01/18 2343    Allergies:   Iodinated diagnostic agents   Social History: Social History   Socioeconomic History  . Marital status: Widowed    Spouse name: Not on file  . Number of children: 3  . Years of education: Not on file  . Highest education level: Bachelor's degree (e.g., BA, AB, BS)  Occupational History  . Occupation: retired    Comment: retired [employer]  Tobacco Use  . Smoking status: Former Smoker    Packs/day: 2.00    Years: 12.00    Pack years: 24.00    Types: Cigarettes    Quit date: 09/07/1962    Years since quitting: 57.9  . Smokeless tobacco: Never Used  Vaping Use  . Vaping Use: Never used  Substance and Sexual Activity  . Alcohol use: Yes    Alcohol/week: 2.0 standard drinks    Types: 2 Glasses of wine per week    Comment: daily  . Drug use: Never  . Sexual activity: Not on file  Other Topics Concern  . Not on file  Social History Narrative   Previously lived in [place]   03/12/20 Now lives in [place]   Retired Chief Financial Officer   Caffeine 2 a day   Social Determinants of Systems developer Strain:   . Difficulty of Paying Living Expenses: Not on file  Food Insecurity:   . Worried About Charity fundraiser in the Last Year: Not on file  . Ran Out of Food in the Last Year: Not on file  Transportation Needs:   . Lack of Transportation (Medical): Not on file  . Lack of Transportation (  Non-Medical): Not on file  Physical Activity:   . Days of Exercise per Week: Not on file  . Minutes of Exercise per Session: Not on file  Stress:   . Feeling of Stress : Not on file  Social Connections:   . Frequency of Communication with Friends and Family: Not on file  . Frequency of Social Gatherings with Friends and Family: Not on file  . Attends Religious Services: Not on file  . Active Member of Clubs or Organizations: Not on file  . Attends Archivist Meetings: Not on file  . Marital Status: Not on file  Intimate Partner Violence:   . Fear of Current or Ex-Partner: Not on file  . Emotionally Abused: Not on file  . Physically Abused: Not on file  . Sexually Abused: Not on file    Family History: Family History  Problem Relation Age of Onset  . Cirrhosis Mother   . Stroke Father   . Lung cancer Sister   . Lung cancer Brother      Review of Systems: All other systems reviewed and are otherwise negative except as noted above.  Physical Exam: Vitals:   07/31/20 0857  BP: 132/70  Pulse: 63  SpO2: 99%  Weight: 193 lb 6.4 oz (87.7 kg)  Height: 6' (1.829 m)     GEN- The patient is well appearing, alert and oriented x 3 today.   HEENT: normocephalic, atraumatic; sclera clear, conjunctiva pink; hearing intact; oropharynx clear; neck supple  Lungs- Clear to ausculation bilaterally, normal work of breathing.  No wheezes, rales, rhonchi Heart- Regular rate and rhythm, no murmurs, rubs or gallops  GI- soft, non-tender, non-distended, bowel sounds present  Extremities- no clubbing or cyanosis. No edema MS- no significant deformity or atrophy Skin- warm and  dry, no rash or lesion; PPM pocket well healed Psych- euthymic mood, full affect Neuro- strength and sensation are intact  PPM Interrogation- reviewed in detail today,  See PACEART report  EKG:  EKG is not ordered today.  Recent Labs: 03/06/2020: BUN 47; Creatinine, Ser 1.46; Potassium 4.4; Sodium 142 03/08/2020: Hemoglobin 10.3; Platelets 82   Wt Readings from Last 3 Encounters:  07/31/20 193 lb 6.4 oz (87.7 kg)  05/14/20 190 lb (86.2 kg)  03/13/20 190 lb (86.2 kg)     Other studies Reviewed: Additional studies/ records that were reviewed today include: Previous EP office notes, Previous remote checks, Most recent labwork.   Assessment and Plan:  1. Symptomatic bradycardia s/p Medtronic PPM  Normal PPM function See Pace Art No changes today.   2. Permanent Afib Rates well controlled.  Continue coumadin for CHA2DS2VASC of at least 6   Continue digoxin. Recent levels OK.    3. HTN Stable.   4. High risk drug monitoring Digoxin level as above.  5. Chronic diastolic CHF LVEF 99-37% 09/6965 Volume status much improved. Continue lasix 40 mg daily. Discussed sliding scale diuretics, sodium restriction, fluid restriction. He should take 20 mg lasix EXTRA as needed for weight gain of 3 lbs overnight, or 5 lbs within one week.  BMET today.    Current medicines are reviewed at length with the patient today.   The patient does not have concerns regarding his medicines.  The following changes were made today:  none  Labs/ tests ordered today include:  Orders Placed This Encounter  Procedures  . Basic metabolic panel  . Digoxin level  . CUP PACEART INCLINIC DEVICE CHECK    Disposition:   Follow up  with EP APP as scheduled.    Jacalyn Lefevre, PA-C  07/31/2020 9:29 AM  [place] [address] [phone] (office) [phone] (fax)

## 2020-07-31 ENCOUNTER — Encounter: Payer: Self-pay | Admitting: Student

## 2020-07-31 ENCOUNTER — Other Ambulatory Visit: Payer: Self-pay

## 2020-07-31 ENCOUNTER — Ambulatory Visit (INDEPENDENT_AMBULATORY_CARE_PROVIDER_SITE_OTHER): Payer: Medicare Other | Admitting: Student

## 2020-07-31 VITALS — BP 132/70 | HR 63 | Ht 72.0 in | Wt 193.4 lb

## 2020-07-31 DIAGNOSIS — R001 Bradycardia, unspecified: Secondary | ICD-10-CM | POA: Diagnosis not present

## 2020-07-31 DIAGNOSIS — I5032 Chronic diastolic (congestive) heart failure: Secondary | ICD-10-CM | POA: Diagnosis not present

## 2020-07-31 DIAGNOSIS — I10 Essential (primary) hypertension: Secondary | ICD-10-CM

## 2020-07-31 DIAGNOSIS — I4821 Permanent atrial fibrillation: Secondary | ICD-10-CM

## 2020-07-31 LAB — CUP PACEART INCLINIC DEVICE CHECK
Battery Impedance: 2403 Ohm
Battery Remaining Longevity: 30 mo
Battery Voltage: 2.73 V
Brady Statistic RV Percent Paced: 18 %
Date Time Interrogation Session: 20211124092800
Implantable Lead Implant Date: 20041008
Implantable Lead Implant Date: 20041008
Implantable Lead Location: 753859
Implantable Lead Location: 753860
Implantable Lead Model: 5076
Implantable Lead Model: 5076
Implantable Pulse Generator Implant Date: 20130328
Lead Channel Impedance Value: 67 Ohm
Lead Channel Impedance Value: 842 Ohm
Lead Channel Pacing Threshold Amplitude: 1.25 V
Lead Channel Pacing Threshold Pulse Width: 1 ms
Lead Channel Sensing Intrinsic Amplitude: 11.2 mV
Lead Channel Setting Pacing Amplitude: 2.5 V
Lead Channel Setting Pacing Pulse Width: 1 ms
Lead Channel Setting Sensing Sensitivity: 4 mV

## 2020-07-31 NOTE — Patient Instructions (Addendum)
Medication Instructions:  Your physician recommends that you continue on your current medications as directed. Please refer to the Current Medication list given to you today.  *If you need a refill on your cardiac medications before your next appointment, please call your pharmacy*   Lab Work: Bmet, Digoxin Level- Today   If you have labs (blood work) drawn today and your tests are completely normal, you will receive your results only by: Marland Kitchen MyChart Message (if you have MyChart) OR . A paper copy in the mail If you have any lab test that is abnormal or we need to change your treatment, we will call you to review the results.   Testing/Procedures: None ordered   Follow-Up: Keep follow up appointment with Joesph July PA-C on 09/09/20 @ 11:45 AM  Other Instructions Weigh yourself every day, if you have a weight gain of 3 pounds in a day or 5 pounds in a week take an extra half of Lasix

## 2020-08-01 LAB — BASIC METABOLIC PANEL
BUN/Creatinine Ratio: 27 — ABNORMAL HIGH (ref 10–24)
BUN: 45 mg/dL — ABNORMAL HIGH (ref 8–27)
CO2: 25 mmol/L (ref 20–29)
Calcium: 9.4 mg/dL (ref 8.6–10.2)
Chloride: 102 mmol/L (ref 96–106)
Creatinine, Ser: 1.69 mg/dL — ABNORMAL HIGH (ref 0.76–1.27)
GFR calc Af Amer: 41 mL/min/{1.73_m2} — ABNORMAL LOW (ref >59)
GFR calc non Af Amer: 35 mL/min/{1.73_m2} — ABNORMAL LOW (ref >59)
Glucose: 102 mg/dL — ABNORMAL HIGH (ref 65–99)
Potassium: 4.4 mmol/L (ref 3.5–5.2)
Sodium: 142 mmol/L (ref 134–144)

## 2020-08-01 LAB — DIGOXIN LEVEL: Digoxin, Serum: 0.6 ng/mL (ref 0.5–0.9)

## 2020-08-03 NOTE — Progress Notes (Signed)
[facility] Note  08/07/2020     CHIEF COMPLAINT Patient presents for Retina Follow Up   HISTORY OF PRESENT ILLNESS: Steven Williams is a 86 y.o. male who presents to the clinic today for:   HPI    Retina Follow Up    Patient presents with  Wet AMD.  In both eyes.  This started years ago.  Severity is moderate.  Duration of 6 weeks.  Since onset it is stable.  I, the attending physician,  performed the HPI with the patient and updated documentation appropriately.          Comments    86 y/o male pt here for 6 wk f/u for exu ARMD OU.  No change in [place] OU.  Denies pain, FOL, floaters.  No gtts.       Last edited by [name], MD on 08/07/2020  5:51 PM. (History)    pt states vision is stable  Referring physician:   HISTORICAL INFORMATION:   Selected notes from the [facility] Referred by [name], [place]   CURRENT MEDICATIONS: No current outpatient medications on file. (Ophthalmic Drugs)   No current facility-administered medications for this visit. (Ophthalmic Drugs)   Current Outpatient Medications (Other)  Medication Sig  . allopurinol (ZYLOPRIM) 100 MG tablet TK 1 T PO D  . carvedilol (COREG) 6.25 MG tablet Take 6.25 mg by mouth 2 (two) times daily.  . Cholecalciferol (VITAMIN D3) 50 MCG (2000 UT) TABS Take 1 tablet by mouth 2 (two) times daily.  Marland Kitchen dicyclomine (BENTYL) 10 MG capsule Take 10 mg by mouth 4 (four) times daily.  . digoxin (LANOXIN) 0.125 MG tablet Take 0.125 mg by mouth as directed. Patient takes medication on M/W/F  . diphenhydrAMINE (BENADRYL) 50 MG tablet Take 1 tablet (50 mg total) by mouth once for 1 dose. Please take within one hour of CT scan. (Patient not taking: Reported on 05/14/2020)  . doxycycline (MONODOX) 100 MG capsule Take 100 mg by mouth 2 (two) times daily.  . Ferrous Sulfate (IRON) 325 (65 Fe) MG TABS Take 1 tablet by mouth 2 (two) times daily.  . furosemide (LASIX) 40 MG tablet Take 40  mg by mouth daily.  Marland Kitchen levothyroxine (SYNTHROID) 100 MCG tablet Take 100 mcg by mouth every morning.  Marland Kitchen levothyroxine (SYNTHROID, LEVOTHROID) 88 MCG tablet Take 88 mcg by mouth daily before breakfast.  . Misc Natural Products (GLUCOSAMINE CHOND COMPLEX/MSM PO) Take 1 tablet by mouth daily.  . Multiple Vitamins-Minerals (PRESERVISION AREDS PO) Take 1 tablet by mouth 2 (two) times daily.  . mupirocin ointment (BACTROBAN) 2 % 3 (three) times daily.  . NON FORMULARY Antifungal nail cream from [facility] - faxed 03/27/2019  [facility]  . pantoprazole (PROTONIX) 40 MG tablet Take by mouth.  . predniSONE (DELTASONE) 50 MG tablet Take 50 mg tablet 13 hours prior to CT scan, Take 50 mg tablet 7 hours prior to CT scan Take 50 mg tablet 1 hour prior to CT scan.  . pregabalin (LYRICA) 75 MG capsule Take 75 mg by mouth 2 (two) times daily.  Marland Kitchen rOPINIRole (REQUIP) 2 MG tablet Take 2 mg by mouth at bedtime.  . rosuvastatin (CRESTOR) 10 MG tablet Take 10 mg by mouth daily.  . saw palmetto 500 MG capsule Take 500 mg by mouth 2 (two) times daily.  . silver nitrate applicators 32-44 % applicator Apply topically.  . tamsulosin (FLOMAX) 0.4 MG CAPS capsule Take 0.4 mg by  mouth daily.  Marland Kitchen warfarin (COUMADIN) 2 MG tablet Take 2 mg by mouth daily.   Current Facility-Administered Medications (Other)  Medication Route  . Bevacizumab (AVASTIN) SOLN 1.25 mg Intravitreal      REVIEW OF SYSTEMS: ROS    Positive for: Gastrointestinal, Neurological, Genitourinary, Cardiovascular, Eyes, Respiratory   Negative for: Constitutional, Skin, Musculoskeletal, HENT, Endocrine, Psychiatric, Allergic/Imm, Heme/Lymph   Last edited by Matthew Folks, COA on 08/07/2020  1:44 PM. (History)       ALLERGIES Allergies  Allergen Reactions  . Iodinated Diagnostic Agents Rash    PAST MEDICAL HISTORY Past Medical History:  Diagnosis Date  . Antiphospholipid antibody positive   . Aortic aneurysm [facility])    thoracic, Dr  Roxan Hockey  . Aortic dissection ([place])   . BPH (benign prostatic hyperplasia)   . Central retinal artery occlusion   . Chronic anemia   . Chronic renal insufficiency   . COPD (chronic obstructive pulmonary disease) ([place])   . DDD (degenerative disc disease), cervical   . DJD (degenerative joint disease)   . DVT (deep venous thrombosis) ([place])   . Gastric dysmotility   . GERD (gastroesophageal reflux disease)   . Gout   . HTN (hypertension)   . Hyperlipidemia   . Hypertensive retinopathy    OU  . Hypothyroid   . Macular degeneration   . Myelodysplasia (myelodysplastic syndrome) ([place])   . Paget's disease of bone   . Peripheral neuropathy   . Permanent atrial fibrillation ([place])   . Rheumatoid arthritis ([place])   . Symptomatic bradycardia   . TIA (transient ischemic attack)    Past Surgical History:  Procedure Laterality Date  . ATRIAL FIBRILLATION ABLATION  09/23/2014   Dr Encarnacion Chu in [place]  . BACK SURGERY     lumbar, x 2  . CAROTID ENDARTERECTOMY Right 1990  . CAROTID STENT    . CATARACT EXTRACTION Bilateral 2010   [place]  . EYE SURGERY Bilateral 2010   [facility]  . LUNG DECORTICATION     VATS  . PACEMAKER GENERATOR CHANGE  12/03/2011   MDT Adapta [id number] generator change by Dr Encarnacion Chu for symptomatic bradycardia  . PACEMAKER IMPLANT  2004   MDT     FAMILY HISTORY Family History  Problem Relation Age of Onset  . Cirrhosis Mother   . Stroke Father   . Lung cancer Sister   . Lung cancer Brother     SOCIAL HISTORY Social History   Tobacco Use  . Smoking status: Former Smoker    Packs/day: 2.00    Years: 12.00    Pack years: 24.00    Types: Cigarettes    Quit date: 09/07/1962    Years since quitting: 57.9  . Smokeless tobacco: Never Used  Vaping Use  . Vaping Use: Never used  Substance Use Topics  . Alcohol use: Yes    Alcohol/week: 2.0 standard drinks    Types: 2 Glasses of wine per week    Comment: daily  . Drug use: Never         OPHTHALMIC  EXAM:  Base Eye Exam    Visual Acuity (Snellen - Linear)      Right Left   Dist cc CF @ 2' 20/50   Dist ph cc NI 20/40 -2   Correction: Glasses       Tonometry (Tonopen, 1:46 PM)      Right Left   Pressure 12 12       Pupils      Dark  Light Shape React APD   Right 2 1 Round Minimal None   Left 2 1 Round Minimal None       Visual Fields (Counting fingers)      Left Right    Full Full       Extraocular Movement      Right Left    Full, Ortho Full, Ortho       Neuro/Psych    Oriented x3: Yes   Mood/Affect: Normal       Dilation    Both eyes: 1.0% Mydriacyl, 2.5% Phenylephrine @ 1:46 PM        Slit Lamp and Fundus Exam    Slit Lamp Exam      Right Left   Lids/Lashes Dermatochalasis - upper lid, Meibomian gland dysfunction Dermatochalasis - upper lid, Meibomian gland dysfunction   Conjunctiva/Sclera White and quiet White and quiet   Cornea Arcus, 1+Punctate epithelial erosions, Well healed cataract wounds Arcus, 1-2+inferior Punctate epithelial erosions centrally, Well healed cataract wounds, mild endopigment   Anterior Chamber Deep and quiet Deep and quiet   Iris Round and moderately dilated Round and moderately dilated   Lens Posterior chamber intraocular lens, 1+ non-central Posterior capsular opacification Posterior chamber intraocular lens, open PC   Vitreous Vitreous syneresis, Posterior vitreous detachment Vitreous syneresis       Fundus Exam      Right Left   Disc +cupping, trace pallor, thin superior rim, 360 PPA mild pallor, 360 PPA, +cupping   C/D Ratio 0.7 0.7   Macula Flat, diffuse RPE atrophy, central pigment clumping, No heme or edema Flat, Blunted foveal reflex, Drusen, RPE mottling, clumping and atrophy, No heme or edema, trace cystic changes -- stably improved, peripapillary GA nasal macula   Vessels Vascular attenuation Vascular attenuation, Tortuous   Periphery Attached, mild reticular degeneration, mild paving stone inferiorly Attached, mild  reticular degeneration          IMAGING AND PROCEDURES  Imaging and Procedures for @TODAY @  OCT, Retina - OU - Both Eyes       Right Eye Quality was good. Central Foveal Thickness: 271. Progression has been stable. Findings include abnormal foveal contour, no IRF, no SRF, subretinal hyper-reflective material, retinal drusen , outer retinal atrophy, pigment epithelial detachment.   Left Eye Quality was good. Central Foveal Thickness: 269. Progression has been stable. Findings include abnormal foveal contour, no SRF, outer retinal atrophy, retinal drusen , pigment epithelial detachment, subretinal hyper-reflective material, no IRF (Stable improvement in cystic changes ).   Notes *Images captured and stored on drive  Diagnosis / Impression:  Exudative ARMD OU OD: +atrophic macular scar - stable OS: Stable improvement in cystic changes   Clinical management:  See below  Abbreviations: NFP - Normal foveal profile. CME - cystoid macular edema. PED - pigment epithelial detachment. IRF - intraretinal fluid. SRF - subretinal fluid. EZ - ellipsoid zone. ERM - epiretinal membrane. ORA - outer retinal atrophy. ORT - outer retinal tubulation. SRHM - subretinal hyper-reflective material        Intravitreal Injection, Pharmacologic Agent - OS - Left Eye       Time Out 08/07/2020. 2:21 PM. Confirmed correct patient, procedure, site, and patient consented.   Anesthesia Topical anesthesia was used. Anesthetic medications included Lidocaine 2%, Proparacaine 0.5%.   Procedure Preparation included 5% betadine to ocular surface, eyelid speculum. A (32g) needle was used.   Injection:  2 mg aflibercept Alfonse Flavors) SOLN   NDC: A3590391, Lot: [id number], Expiration date: 10/08/2020  Route: Intravitreal, Site: Left Eye, Waste: 0.05 mL  Post-op Post injection exam found visual acuity of at least counting fingers. The patient tolerated the procedure well. There were no complications. The  patient received written and verbal post procedure care education. Post injection medications were not given.                 ASSESSMENT/PLAN:    ICD-10-CM   1. Exudative age-related macular degeneration of left eye with active choroidal neovascularization (HCC)  H35.3221 Intravitreal Injection, Pharmacologic Agent - OS - Left Eye    aflibercept (EYLEA) SOLN 2 mg    aflibercept (EYLEA) SOLN 2 mg  2. Exudative age-related macular degeneration of right eye with inactive scar ([place])  H35.3213   3. Retinal edema  H35.81 OCT, Retina - OU - Both Eyes  4. Essential hypertension  I10   5. Hypertensive retinopathy of both eyes  H35.033   6. Pseudophakia of both eyes  Z96.1     1-3. 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    OD- w/ inactive scar -- history of low vision, CF 3'  OS- w/ CNV   - history of multiple IVL OS in [place], [place] w/ Dr. Doristine Church -- last injection 06/2018, q4-6 wk interval  - moved to [place] in November 2019 and has been followed by Dr. Baird Cancer who had recommended observation, thus pt sought second opinion  - given functional monocular status and history of active CNV, has been receiving maintenance injections, s/p IVA #1 OS (02.25.20), #2 (04.07.20), #3 (05.05.20), #4 (06.22.20), #5 (08.03.20), #6 (03.22.21)  - s/p IVE OS #1 (09.14.20), #2 (10.23.20), #3 (11.30.20), #4 (01.11.21), #5 (02.15.21), #6 (03.22.21), #7 (05.03.21), #8 (06.16.21), #9 (07.26.21), #10 (09.07.21), #11 (10.19.21)  - OCT shows PEDs, CNV w/ stable improvement in cystic changes OS; OD with inactive scar  - BCVA stable at 20/40 OS   - recommend IVE #12 OS today, 12.01.21, maintenance w/ f/u in 7 wks (slow tx and ext)  - pt prefers aggressive maintenance given functional monocular stutus  - pt wishes to be treated with IVE OS  - RBA of procedure discussed, questions answered  - informed consent obtained   - Eylea informed consent form signed and scanned on 01.11.2021  - see procedure  note  - Eylea4U benefits investigation started 9.14.20 -- approved for 2021  - f/u in 7 wks -- DFE, OCT, likely injection  4,5. Hypertensive retinopathy OU  - discussed importance of tight BP control  - monitor  6. Pseudophakia OU  - s/p CE/IOL OU  - monitor  Ophthalmic Meds Ordered this visit:  Meds ordered this encounter  Medications  . aflibercept (EYLEA) SOLN 2 mg  . aflibercept (EYLEA) SOLN 2 mg       Return in about 7 weeks (around 09/25/2020) for f/u exu ARMD OU, DFE, OCT.  There are no Patient Instructions on file for this visit.   Explained the diagnoses, plan, and follow up with the patient and they expressed understanding.  Patient expressed understanding of the importance of proper follow up care.   This document serves as a record of services personally performed by Gardiner Sleeper, MD, PhD. It was created on their behalf by Roselee Nova, COMT. The creation of this record is the provider's dictation and/or activities during the visit.  Electronically signed by: Roselee Nova, COMT 08/07/20 11:48 PM   This document serves as a record of services personally performed by Gardiner Sleeper, MD, PhD. It  was created on their behalf by San Jetty. Owens Shark, OA an ophthalmic technician. The creation of this record is the provider's dictation and/or activities during the visit.    Electronically signed by: San Jetty. Owens Shark, [place] 12.01.2021 11:48 PM  Gardiner Sleeper, M.D., Ph.D. Diseases & Surgery of the Retina and Vitreous [facility]  I have reviewed the above documentation for accuracy and completeness, and I agree with the above. Gardiner Sleeper, M.D., Ph.D. 08/07/20 11:48 PM   Abbreviations: M myopia (nearsighted); A astigmatism; H hyperopia (farsighted); P presbyopia; Mrx spectacle prescription;  CTL contact lenses; OD right eye; OS left eye; OU both eyes  XT exotropia; ET esotropia; PEK punctate epithelial keratitis; PEE punctate epithelial erosions;  DES dry eye syndrome; MGD meibomian gland dysfunction; ATs artificial tears; PFAT's preservative free artificial tears; [place] nuclear sclerotic cataract; PSC posterior subcapsular cataract; ERM epi-retinal membrane; PVD posterior vitreous detachment; RD retinal detachment; DM diabetes mellitus; DR diabetic retinopathy; NPDR non-proliferative diabetic retinopathy; PDR proliferative diabetic retinopathy; CSME clinically significant macular edema; DME diabetic macular edema; dbh dot blot hemorrhages; CWS cotton wool spot; POAG primary open angle glaucoma; C/D cup-to-disc ratio; HVF humphrey visual field; GVF goldmann visual field; OCT optical coherence tomography; IOP intraocular pressure; BRVO Branch retinal vein occlusion; CRVO central retinal vein occlusion; CRAO central retinal artery occlusion; BRAO branch retinal artery occlusion; RT retinal tear; SB scleral buckle; PPV pars plana vitrectomy; VH Vitreous hemorrhage; PRP panretinal laser photocoagulation; IVK intravitreal kenalog; VMT vitreomacular traction; MH Macular hole;  NVD neovascularization of the disc; NVE neovascularization elsewhere; AREDS age related eye disease study; ARMD age related macular degeneration; POAG primary open angle glaucoma; EBMD epithelial/anterior basement membrane dystrophy; ACIOL anterior chamber intraocular lens; IOL intraocular lens; PCIOL posterior chamber intraocular lens; Phaco/IOL phacoemulsification with intraocular lens placement; [place] photorefractive keratectomy; LASIK laser assisted in situ keratomileusis; HTN hypertension; DM diabetes mellitus; COPD chronic obstructive pulmonary disease

## 2020-08-07 ENCOUNTER — Ambulatory Visit (INDEPENDENT_AMBULATORY_CARE_PROVIDER_SITE_OTHER): Payer: Medicare Other | Admitting: Ophthalmology

## 2020-08-07 ENCOUNTER — Encounter (INDEPENDENT_AMBULATORY_CARE_PROVIDER_SITE_OTHER): Payer: Self-pay | Admitting: Ophthalmology

## 2020-08-07 ENCOUNTER — Other Ambulatory Visit: Payer: Self-pay

## 2020-08-07 DIAGNOSIS — H353213 Exudative age-related macular degeneration, right eye, with inactive scar: Secondary | ICD-10-CM | POA: Diagnosis not present

## 2020-08-07 DIAGNOSIS — H353221 Exudative age-related macular degeneration, left eye, with active choroidal neovascularization: Secondary | ICD-10-CM

## 2020-08-07 DIAGNOSIS — H35033 Hypertensive retinopathy, bilateral: Secondary | ICD-10-CM

## 2020-08-07 DIAGNOSIS — I10 Essential (primary) hypertension: Secondary | ICD-10-CM

## 2020-08-07 DIAGNOSIS — Z961 Presence of intraocular lens: Secondary | ICD-10-CM

## 2020-08-07 DIAGNOSIS — H3581 Retinal edema: Secondary | ICD-10-CM | POA: Diagnosis not present

## 2020-08-07 MED ORDER — AFLIBERCEPT 2MG/0.05ML IZ SOLN FOR KALEIDOSCOPE
2.0000 mg | INTRAVITREAL | Status: AC | PRN
  Administered 2020-08-07: 2 mg via INTRAVITREAL

## 2020-09-09 ENCOUNTER — Encounter: Payer: Self-pay | Admitting: Student

## 2020-09-09 ENCOUNTER — Other Ambulatory Visit: Payer: Self-pay

## 2020-09-09 ENCOUNTER — Ambulatory Visit (INDEPENDENT_AMBULATORY_CARE_PROVIDER_SITE_OTHER): Payer: Medicare Other | Admitting: Student

## 2020-09-09 VITALS — BP 126/75 | HR 62 | Ht 72.0 in | Wt 194.8 lb

## 2020-09-09 DIAGNOSIS — R001 Bradycardia, unspecified: Secondary | ICD-10-CM | POA: Diagnosis not present

## 2020-09-09 DIAGNOSIS — I4821 Permanent atrial fibrillation: Secondary | ICD-10-CM

## 2020-09-09 DIAGNOSIS — I10 Essential (primary) hypertension: Secondary | ICD-10-CM | POA: Diagnosis not present

## 2020-09-09 DIAGNOSIS — I5032 Chronic diastolic (congestive) heart failure: Secondary | ICD-10-CM | POA: Diagnosis not present

## 2020-09-09 LAB — BASIC METABOLIC PANEL
BUN/Creatinine Ratio: 27 — ABNORMAL HIGH (ref 10–24)
BUN: 51 mg/dL — ABNORMAL HIGH (ref 8–27)
CO2: 25 mmol/L (ref 20–29)
Calcium: 9 mg/dL (ref 8.6–10.2)
Chloride: 104 mmol/L (ref 96–106)
Creatinine, Ser: 1.91 mg/dL — ABNORMAL HIGH (ref 0.76–1.27)
GFR calc Af Amer: 35 mL/min/{1.73_m2} — ABNORMAL LOW (ref >59)
GFR calc non Af Amer: 31 mL/min/{1.73_m2} — ABNORMAL LOW (ref >59)
Glucose: 100 mg/dL — ABNORMAL HIGH (ref 65–99)
Potassium: 4.4 mmol/L (ref 3.5–5.2)
Sodium: 142 mmol/L (ref 134–144)

## 2020-09-09 LAB — CUP PACEART INCLINIC DEVICE CHECK
Battery Impedance: 2502 Ohm
Battery Remaining Longevity: 29 mo
Battery Voltage: 2.73 V
Brady Statistic RV Percent Paced: 26 %
Date Time Interrogation Session: 20220103115128
Implantable Lead Implant Date: 20041008
Implantable Lead Implant Date: 20041008
Implantable Lead Location: 753859
Implantable Lead Location: 753860
Implantable Lead Model: 5076
Implantable Lead Model: 5076
Implantable Pulse Generator Implant Date: 20130328
Lead Channel Impedance Value: 67 Ohm
Lead Channel Impedance Value: 837 Ohm
Lead Channel Pacing Threshold Amplitude: 1.25 V
Lead Channel Pacing Threshold Pulse Width: 1 ms
Lead Channel Sensing Intrinsic Amplitude: 11.2 mV
Lead Channel Setting Pacing Amplitude: 2.5 V
Lead Channel Setting Pacing Pulse Width: 1 ms
Lead Channel Setting Sensing Sensitivity: 4 mV

## 2020-09-09 NOTE — Patient Instructions (Signed)
Medication Instructions:  *If you need a refill on your cardiac medications before your next appointment, please call your pharmacy*  Lab Work: Your physician has recommended that you have lab work today: BMET    If you have labs (blood work) drawn today and your tests are completely normal, you will receive your results only by: Marland Kitchen MyChart Message (if you have MyChart) OR . A paper copy in the mail If you have any lab test that is abnormal or we need to change your treatment, we will call you to review the results.  Follow-Up: At [facility], you and your health needs are our priority.  As part of our continuing mission to provide you with exceptional heart care, we have created designated Provider Care Teams.  These Care Teams include your primary Cardiologist (physician) and Advanced Practice Providers (APPs -  Physician Assistants and Nurse Practitioners) who all work together to provide you with the care you need, when you need it.  We recommend signing up for the patient portal called "MyChart".  Sign up information is provided on this After Visit Summary.  MyChart is used to connect with patients for Virtual Visits (Telemedicine).  Patients are able to view lab/test results, encounter notes, upcoming appointments, etc.  Non-urgent messages can be sent to your provider as well.   To learn more about what you can do with MyChart, go to [email or web address].    Your next appointment:   Your physician recommends that you schedule a follow-up appointment in: 6 MONTHS with [name], PA-C  Remote monitoring is used to monitor your Pacemaker from home. This monitoring reduces the number of office visits required to check your device to one time per year. It allows [place] to keep an eye on the functioning of your device to ensure it is working properly. You are scheduled for a device check from home on 09/28/20. You may send your transmission at any time that day. If you have a wireless  device, the transmission will be sent automatically. After your physician reviews your transmission, you will receive a postcard with your next transmission date.  The format for your next appointment:   In Person with Legrand Como "Jonni Sanger" Chalmers Cater, PA-C

## 2020-09-09 NOTE — Progress Notes (Signed)
Electrophysiology Office Note Date: 09/09/2020  ID:  Steven Williams, DOB 1930-06-10, MRN [id number]  PCP: [name], MD Primary Cardiologist: No primary care provider on file. Electrophysiologist: Thompson Grayer, MD   CC: Pacemaker follow-up  Steven Williams is a 87 y.o. male seen today for Thompson Grayer, MD for routine electrophysiology followup.  Since last being seen in our clinic the patient reports doing well overall.  he denies chest pain, palpitations, dyspnea, PND, orthopnea, nausea, vomiting, dizziness, syncope, edema, weight gain, or early satiety.  Device History: Medtronic Dual Chamber PPM implanted 11/2011 for symptomatic bradycardia  Past Medical History:  Diagnosis Date  . Antiphospholipid antibody positive   . Aortic aneurysm [facility])    thoracic, Dr Roxan Hockey  . Aortic dissection ([place])   . BPH (benign prostatic hyperplasia)   . Central retinal artery occlusion   . Chronic anemia   . Chronic renal insufficiency   . COPD (chronic obstructive pulmonary disease) ([place])   . DDD (degenerative disc disease), cervical   . DJD (degenerative joint disease)   . DVT (deep venous thrombosis) ([place])   . Gastric dysmotility   . GERD (gastroesophageal reflux disease)   . Gout   . HTN (hypertension)   . Hyperlipidemia   . Hypertensive retinopathy    OU  . Hypothyroid   . Macular degeneration   . Myelodysplasia (myelodysplastic syndrome) ([place])   . Paget's disease of bone   . Peripheral neuropathy   . Permanent atrial fibrillation ([place])   . Rheumatoid arthritis ([place])   . Symptomatic bradycardia   . TIA (transient ischemic attack)    Past Surgical History:  Procedure Laterality Date  . ATRIAL FIBRILLATION ABLATION  09/23/2014   Dr Encarnacion Chu in [place]  . BACK SURGERY     lumbar, x 2  . CAROTID ENDARTERECTOMY Right 1990  . CAROTID STENT    . CATARACT EXTRACTION Bilateral 2010   [place]  . EYE SURGERY Bilateral 2010   [facility]  . LUNG DECORTICATION     VATS  . PACEMAKER  GENERATOR CHANGE  12/03/2011   MDT [facility]pta [id number] generator change by Dr Encarnacion Chu for symptomatic bradycardia  . PACEMAKER IMPLANT  2004   MDT     Current Outpatient Medications  Medication Sig Dispense Refill  . allopurinol (ZYLOPRIM) 100 MG tablet TK 1 T PO D    . carvedilol (COREG) 6.25 MG tablet Take 6.25 mg by mouth 2 (two) times daily.    . Cholecalciferol (VITAMIN D3) 50 MCG (2000 UT) TABS Take 1 tablet by mouth 2 (two) times daily.    Marland Kitchen dicyclomine (BENTYL) 10 MG capsule Take 10 mg by mouth 4 (four) times daily.    . digoxin (LANOXIN) 0.125 MG tablet Take 0.125 mg by mouth as directed. Patient takes medication on M/W/F    . doxycycline (MONODOX) 100 MG capsule Take 100 mg by mouth 2 (two) times daily.    . Ferrous Sulfate (IRON) 325 (65 Fe) MG TABS Take 1 tablet by mouth 2 (two) times daily.    . furosemide (LASIX) 40 MG tablet Take 40 mg by mouth daily.    Marland Kitchen levothyroxine (SYNTHROID) 100 MCG tablet Take 100 mcg by mouth every morning.    Marland Kitchen levothyroxine (SYNTHROID, LEVOTHROID) 88 MCG tablet Take 88 mcg by mouth daily before breakfast.    . Misc Natural Products (GLUCOSAMINE CHOND COMPLEX/MSM PO) Take 1 tablet by mouth daily.    . Multiple Vitamins-Minerals (PRESERVISION AREDS PO) Take 1 tablet by mouth 2 (two)  times daily.    . mupirocin ointment (BACTROBAN) 2 % 3 (three) times daily.    . NON FORMULARY Antifungal nail cream from [facility] - faxed 03/27/2019  [facility]    . pantoprazole (PROTONIX) 40 MG tablet Take by mouth.    . predniSONE (DELTASONE) 50 MG tablet Take 50 mg tablet 13 hours prior to CT scan, Take 50 mg tablet 7 hours prior to CT scan Take 50 mg tablet 1 hour prior to CT scan. 3 tablet 0  . pregabalin (LYRICA) 75 MG capsule Take 75 mg by mouth 2 (two) times daily.    Marland Kitchen rOPINIRole (REQUIP) 2 MG tablet Take 2 mg by mouth at bedtime.    . rosuvastatin (CRESTOR) 10 MG tablet Take 10 mg by mouth daily.    . saw palmetto 500 MG capsule Take 500 mg by mouth 2 (two)  times daily.    . silver nitrate applicators 67-59 % applicator Apply topically.    . tamsulosin (FLOMAX) 0.4 MG CAPS capsule Take 0.4 mg by mouth daily.    Marland Kitchen warfarin (COUMADIN) 2 MG tablet Take 2 mg by mouth daily.     Current Facility-Administered Medications  Medication Dose Route Frequency Provider Last Rate Last Admin  . Bevacizumab (AVASTIN) SOLN 1.25 mg  1.25 mg Intravitreal  [name], MD   1.25 mg at 11/01/18 2343    Allergies:   Iodinated diagnostic agents   Social History: Social History   Socioeconomic History  . Marital status: Widowed    Spouse name: Not on file  . Number of children: 3  . Years of education: Not on file  . Highest education level: Bachelor's degree (e.g., BA, AB, BS)  Occupational History  . Occupation: retired    Comment: retired [employer]  Tobacco Use  . Smoking status: Former Smoker    Packs/day: 2.00    Years: 12.00    Pack years: 24.00    Types: Cigarettes    Quit date: 09/07/1962    Years since quitting: 58.0  . Smokeless tobacco: Never Used  Vaping Use  . Vaping Use: Never used  Substance and Sexual Activity  . Alcohol use: Yes    Alcohol/week: 2.0 standard drinks    Types: 2 Glasses of wine per week    Comment: daily  . Drug use: Never  . Sexual activity: Not on file  Other Topics Concern  . Not on file  Social History Narrative   Previously lived in [place]   03/12/20 Now lives in [place]   Retired Chief Financial Officer   Caffeine 2 a day   Social Determinants of Radio broadcast assistant Strain: Not on file  Food Insecurity: Not on file  Transportation Needs: Not on file  Physical Activity: Not on file  Stress: Not on file  Social Connections: Not on file  Intimate Partner Violence: Not on file    Family History: Family History  Problem Relation Age of Onset  . Cirrhosis Mother   . Stroke Father   . Lung cancer Sister   . Lung cancer Brother      Review of Systems: All other systems reviewed and are  otherwise negative except as noted above.  Physical Exam: Vitals:   09/09/20 1129  BP: 126/75  Pulse: 62  SpO2: 97%  Weight: 194 lb 12.8 oz (88.4 kg)  Height: 6' (1.829 m)     GEN- The patient is well appearing, alert and oriented x 3 today.   HEENT: normocephalic, atraumatic;  sclera clear, conjunctiva pink; hearing intact; oropharynx clear; neck supple  Lungs- Clear to ausculation bilaterally, normal work of breathing.  No wheezes, rales, rhonchi Heart- Regular rate and rhythm, no murmurs, rubs or gallops  GI- soft, non-tender, non-distended, bowel sounds present  Extremities- no clubbing or cyanosis. No edema MS- no significant deformity or atrophy Skin- warm and dry, no rash or lesion; PPM pocket well healed Psych- euthymic mood, full affect Neuro- strength and sensation are intact  PPM Interrogation- reviewed in detail today,  See PACEART report  EKG:  EKG is not ordered today.   Recent Labs: 03/08/2020: Hemoglobin 10.3; Platelets 82 07/31/2020: BUN 45; Creatinine, Ser 1.69; Potassium 4.4; Sodium 142   Wt Readings from Last 3 Encounters:  09/09/20 194 lb 12.8 oz (88.4 kg)  07/31/20 193 lb 6.4 oz (87.7 kg)  05/14/20 190 lb (86.2 kg)     Other studies Reviewed: Additional studies/ records that were reviewed today include: Previous EP office notes, Previous remote checks, Most recent labwork.   Assessment and Plan:  1. Symptomatic bradycardia s/p Medtronic PPM  Normal PPM function See Pace Art report No changes today  2. Permanent Afib Rates well controlled.  Continue coumadin forCHA2DS2VASC ofat least 6 Continue digoxin.   3. HTN Stable.  4. High risk drug monitoring Recent digoxin level stable.  5. Chronic diastolic CHF LVEF 01-74% 05/4495 Volume status stable.  Continue lasix 40 mg daily Discussed sliding scale diuretics, sodium restriction, fluid restriction. He should take 20 mg lasix EXTRA as needed for weight gain of 3 lbs overnight, or 5 lbs  within one week.  BMET today.   Current medicines are reviewed at length with the patient today.   The patient does not have concerns regarding his medicines.  The following changes were made today:  none  Labs/ tests ordered today include:  Orders Placed This Encounter  Procedures  . Basic Metabolic Panel (BMET)     Disposition:   Follow up with EP APP in 3 Months    Signed, Annamaria Helling  09/09/2020 11:50 AM  [facility] [address] [place] [address] [phone] (office) [phone] (fax)

## 2020-09-10 ENCOUNTER — Telehealth: Payer: Self-pay

## 2020-09-10 DIAGNOSIS — I4821 Permanent atrial fibrillation: Secondary | ICD-10-CM

## 2020-09-10 NOTE — Telephone Encounter (Signed)
-----   Message from Shirley Friar, PA-C sent at 09/09/2020  4:24 PM EST ----- AKI noted consistent with hypovolemia in setting of extra lasix use prn.  Please have him hold 3 days then resume lasix at 40 mg every OTHER day.  Legrand Como [address] [place], [place]

## 2020-09-10 NOTE — Telephone Encounter (Signed)
Pt is aware and agreeable to results and recommendations Pt is agreeable to repeat BMET on 01/14 Pt is agreeable to holding furosemide for 3 days and then resuming at QOD.

## 2020-09-11 ENCOUNTER — Telehealth: Payer: Self-pay

## 2020-09-11 NOTE — Telephone Encounter (Signed)
Follow Up:     Pt said he had talked to [facility] yesterday and she had guiven him some instructions about his medicine. He have some questions about the instructions please.i

## 2020-09-11 NOTE — Telephone Encounter (Signed)
Message from Shirley Friar, PA-C sent at 09/09/2020  4:24 PM EST ----- AKI noted consistent with hypovolemia in setting of extra lasix use prn.  Please have him hold 3 days then resume lasix at 40 mg every OTHER day.   Patient calling in with concerns about recent medication changes. Called patient and reiterated the above recommendations from AT, PA-C.  Patient states he is concerned that he will become SOB. Patient states he has history of pleural effusions and is worried fluid will begin to build up. Educated patient on etiology of his current AKI and probable hypovolemic state. Encouraged patient to try AT's recommendations and monitor weight, SOB closely and to call us with any new concerns. Assured patient that this plan can be changed by provider if necessary. After thorough discussion, patient is agreeable to plan.

## 2020-09-12 NOTE — Telephone Encounter (Signed)
Noted! Thank you

## 2020-09-20 ENCOUNTER — Other Ambulatory Visit: Payer: Self-pay

## 2020-09-20 ENCOUNTER — Other Ambulatory Visit: Payer: Medicare Other | Admitting: *Deleted

## 2020-09-20 DIAGNOSIS — I4821 Permanent atrial fibrillation: Secondary | ICD-10-CM

## 2020-09-21 LAB — BASIC METABOLIC PANEL
BUN/Creatinine Ratio: 31 — ABNORMAL HIGH (ref 10–24)
BUN: 54 mg/dL — ABNORMAL HIGH (ref 8–27)
CO2: 22 mmol/L (ref 20–29)
Calcium: 9.4 mg/dL (ref 8.6–10.2)
Chloride: 106 mmol/L (ref 96–106)
Creatinine, Ser: 1.76 mg/dL — ABNORMAL HIGH (ref 0.76–1.27)
GFR calc Af Amer: 39 mL/min/{1.73_m2} — ABNORMAL LOW (ref >59)
GFR calc non Af Amer: 34 mL/min/{1.73_m2} — ABNORMAL LOW (ref >59)
Glucose: 98 mg/dL (ref 65–99)
Potassium: 4.5 mmol/L (ref 3.5–5.2)
Sodium: 142 mmol/L (ref 134–144)

## 2020-09-23 ENCOUNTER — Ambulatory Visit (INDEPENDENT_AMBULATORY_CARE_PROVIDER_SITE_OTHER): Payer: Medicare Other

## 2020-09-23 DIAGNOSIS — I4821 Permanent atrial fibrillation: Secondary | ICD-10-CM | POA: Diagnosis not present

## 2020-09-24 NOTE — Progress Notes (Signed)
[facility] Note  09/25/2020     CHIEF COMPLAINT Patient presents for Retina Follow Up   HISTORY OF PRESENT ILLNESS: Steven Williams is a 87 y.o. male who presents to the clinic today for:   HPI    Retina Follow Up    Patient presents with  Wet AMD.  In left eye.  This started weeks ago.  Severity is moderate.  Duration of weeks.  Since onset it is stable.  I, the attending physician,  performed the HPI with the patient and updated documentation appropriately.          Comments    Pt states vision is the same OU.  Pt denies eye pain or discomfort and denies any new or worsening floaters or fol OU.       Last edited by [name], MD on 09/25/2020  9:13 PM. (History)    pt states   Referring physician:   HISTORICAL INFORMATION:   Selected notes from the [place] Referred by [name], [place]   CURRENT MEDICATIONS: No current outpatient medications on file. (Ophthalmic Drugs)   No current facility-administered medications for this visit. (Ophthalmic Drugs)   Current Outpatient Medications (Other)  Medication Sig  . allopurinol (ZYLOPRIM) 100 MG tablet TK 1 T PO D  . carvedilol (COREG) 6.25 MG tablet Take 6.25 mg by mouth 2 (two) times daily.  . Cholecalciferol (VITAMIN D3) 50 MCG (2000 UT) TABS Take 1 tablet by mouth 2 (two) times daily.  Marland Kitchen dicyclomine (BENTYL) 10 MG capsule Take 10 mg by mouth 4 (four) times daily.  . digoxin (LANOXIN) 0.125 MG tablet Take 0.125 mg by mouth as directed. Patient takes medication on M/W/F  . doxycycline (MONODOX) 100 MG capsule Take 100 mg by mouth 2 (two) times daily.  . Ferrous Sulfate (IRON) 325 (65 Fe) MG TABS Take 1 tablet by mouth 2 (two) times daily.  . furosemide (LASIX) 40 MG tablet Take 40 mg by mouth every other day.  . levothyroxine (SYNTHROID) 100 MCG tablet Take 100 mcg by mouth every morning.  Marland Kitchen levothyroxine (SYNTHROID, LEVOTHROID) 88 MCG tablet Take 88 mcg by mouth  daily before breakfast.  . Misc Natural Products (GLUCOSAMINE CHOND COMPLEX/MSM PO) Take 1 tablet by mouth daily.  . Multiple Vitamins-Minerals (PRESERVISION AREDS PO) Take 1 tablet by mouth 2 (two) times daily.  . mupirocin ointment (BACTROBAN) 2 % 3 (three) times daily.  . NON FORMULARY Antifungal nail cream from [facility] - faxed 03/27/2019  [facility]  . pantoprazole (PROTONIX) 40 MG tablet Take by mouth.  . predniSONE (DELTASONE) 50 MG tablet Take 50 mg tablet 13 hours prior to CT scan, Take 50 mg tablet 7 hours prior to CT scan Take 50 mg tablet 1 hour prior to CT scan.  . pregabalin (LYRICA) 75 MG capsule Take 75 mg by mouth 2 (two) times daily.  Marland Kitchen rOPINIRole (REQUIP) 2 MG tablet Take 2 mg by mouth at bedtime.  . rosuvastatin (CRESTOR) 10 MG tablet Take 10 mg by mouth daily.  . saw palmetto 500 MG capsule Take 500 mg by mouth 2 (two) times daily.  . silver nitrate applicators 10-25 % applicator Apply topically.  . tamsulosin (FLOMAX) 0.4 MG CAPS capsule Take 0.4 mg by mouth daily.  Marland Kitchen warfarin (COUMADIN) 2 MG tablet Take 2 mg by mouth daily.   Current Facility-Administered Medications (Other)  Medication Route  . Bevacizumab (AVASTIN) SOLN 1.25 mg Intravitreal  REVIEW OF SYSTEMS: ROS    Positive for: Gastrointestinal, Neurological, Genitourinary, Cardiovascular, Eyes, Respiratory   Negative for: Constitutional, Skin, Musculoskeletal, HENT, Endocrine, Psychiatric, Allergic/Imm, Heme/Lymph   Last edited by Doneen Poisson on 09/25/2020  1:36 PM. (History)       ALLERGIES Allergies  Allergen Reactions  . Iodinated Diagnostic Agents Rash    PAST MEDICAL HISTORY Past Medical History:  Diagnosis Date  . Antiphospholipid antibody positive   . Aortic aneurysm [facility])    thoracic, Dr Roxan Hockey  . Aortic dissection ([place])   . BPH (benign prostatic hyperplasia)   . Central retinal artery occlusion   . Chronic anemia   . Chronic renal insufficiency   . COPD  (chronic obstructive pulmonary disease) ([place])   . DDD (degenerative disc disease), cervical   . DJD (degenerative joint disease)   . DVT (deep venous thrombosis) ([place])   . Gastric dysmotility   . GERD (gastroesophageal reflux disease)   . Gout   . HTN (hypertension)   . Hyperlipidemia   . Hypertensive retinopathy    OU  . Hypothyroid   . Macular degeneration   . Myelodysplasia (myelodysplastic syndrome) ([place])   . Paget's disease of bone   . Peripheral neuropathy   . Permanent atrial fibrillation ([place])   . Rheumatoid arthritis ([place])   . Symptomatic bradycardia   . TIA (transient ischemic attack)    Past Surgical History:  Procedure Laterality Date  . ATRIAL FIBRILLATION ABLATION  09/23/2014   Dr Encarnacion Chu in [employer]  . BACK SURGERY     lumbar, x 2  . CAROTID ENDARTERECTOMY Right 1990  . CAROTID STENT    . CATARACT EXTRACTION Bilateral 2010   [place]  . EYE SURGERY Bilateral 2010   [facility]  . LUNG DECORTICATION     VATS  . PACEMAKER GENERATOR CHANGE  12/03/2011   MDT Adapta [id number] generator change by Dr Encarnacion Chu for symptomatic bradycardia  . PACEMAKER IMPLANT  2004   MDT     FAMILY HISTORY Family History  Problem Relation Age of Onset  . Cirrhosis Mother   . Stroke Father   . Lung cancer Sister   . Lung cancer Brother     SOCIAL HISTORY Social History   Tobacco Use  . Smoking status: Former Smoker    Packs/day: 2.00    Years: 12.00    Pack years: 24.00    Types: Cigarettes    Quit date: 09/07/1962    Years since quitting: 58.0  . Smokeless tobacco: Never Used  Vaping Use  . Vaping Use: Never used  Substance Use Topics  . Alcohol use: Yes    Alcohol/week: 2.0 standard drinks    Types: 2 Glasses of wine per week    Comment: daily  . Drug use: Never         OPHTHALMIC EXAM:  Base Eye Exam    Visual Acuity (Snellen - Linear)      Right Left   Dist [place] CF @ 3' 20/50 +2   Dist ph [place] NI NI       Tonometry (Tonopen, 1:40 PM)      Right Left    Pressure 13 12       Pupils      Dark Light Shape React APD   Right 4 3 Round Minimal 0   Left 4 3 Round Minimal 0       Visual Fields      Left Right    Full Full  Extraocular Movement      Right Left    Full Full       Neuro/Psych    Oriented x3: Yes   Mood/Affect: Normal       Dilation    Both eyes: 1.0% Mydriacyl, 2.5% Phenylephrine @ 1:40 PM        Slit Lamp and Fundus Exam    Slit Lamp Exam      Right Left   Lids/Lashes Dermatochalasis - upper lid, Meibomian gland dysfunction Dermatochalasis - upper lid, Meibomian gland dysfunction   Conjunctiva/Sclera White and quiet White and quiet   Cornea Arcus, 1+Punctate epithelial erosions, Well healed cataract wounds Arcus, 1-2+inferior Punctate epithelial erosions centrally, Well healed cataract wounds, mild endopigment   Anterior Chamber Deep and quiet Deep and quiet   Iris Round and moderately dilated Round and moderately dilated   Lens Posterior chamber intraocular lens, 1+ non-central Posterior capsular opacification Posterior chamber intraocular lens, open PC   Vitreous Vitreous syneresis, Posterior vitreous detachment Vitreous syneresis       Fundus Exam      Right Left   Disc +cupping, trace pallor, thin superior rim, 360 PPA mild pallor, 360 PPA, +cupping   C/D Ratio 0.7 0.7   Macula Flat, diffuse RPE atrophy, central pigment clumping, No heme or edema Flat, Blunted foveal reflex, Drusen, RPE mottling, clumping and atrophy, No heme or edema, trace cystic changes -- stably improved, peripapillary GA nasal macula   Vessels Vascular attenuation Vascular attenuation, Tortuous   Periphery Attached, mild reticular degeneration, mild paving stone inferiorly Attached, mild reticular degeneration        Refraction    Wearing Rx      Sphere Cylinder Axis Add   Right -1.75 +2.25 005 +2.50   Left -1.50 +2.50 175 +2.50   Type: PAL          IMAGING AND PROCEDURES  Imaging and Procedures for @TODAY @  OCT,  Retina - OU - Both Eyes       Right Eye Quality was good. Central Foveal Thickness: 273. Progression has been stable. Findings include abnormal foveal contour, no IRF, no SRF, subretinal hyper-reflective material, retinal drusen , outer retinal atrophy, pigment epithelial detachment.   Left Eye Quality was good. Central Foveal Thickness: 257. Progression has been stable. Findings include abnormal foveal contour, no SRF, outer retinal atrophy, retinal drusen , pigment epithelial detachment, subretinal hyper-reflective material, no IRF (Stable improvement in cystic changes, stable GA).   Notes *Images captured and stored on drive  Diagnosis / Impression:  Exudative ARMD OU OD: +atrophic macular scar - stable OS: Stable improvement in cystic changes, stable GA  Clinical management:  See below  Abbreviations: NFP - Normal foveal profile. CME - cystoid macular edema. PED - pigment epithelial detachment. IRF - intraretinal fluid. SRF - subretinal fluid. EZ - ellipsoid zone. ERM - epiretinal membrane. ORA - outer retinal atrophy. ORT - outer retinal tubulation. SRHM - subretinal hyper-reflective material        Intravitreal Injection, Pharmacologic Agent - OS - Left Eye       Time Out 09/25/2020. 2:18 PM. Confirmed correct patient, procedure, site, and patient consented.   Anesthesia Topical anesthesia was used. Anesthetic medications included Lidocaine 2%, Proparacaine 0.5%.   Procedure Preparation included 5% betadine to ocular surface, eyelid speculum. A (32g) needle was used.   Injection:  2 mg aflibercept Alfonse Flavors) SOLN   NDC: 81829-937-16, Lot: [id number], Expiration date: 12/06/2020   Route: Intravitreal, Site: Left Eye, Waste: 0.05 mL  Post-op Post injection exam found visual acuity of at least counting fingers. The patient tolerated the procedure well. There were no complications. The patient received written and verbal post procedure care education. Post injection  medications were not given.                 ASSESSMENT/PLAN:    ICD-10-CM   1. Exudative age-related macular degeneration of left eye with active choroidal neovascularization (HCC)  H35.3221 Intravitreal Injection, Pharmacologic Agent - OS - Left Eye    aflibercept (EYLEA) SOLN 2 mg  2. Exudative age-related macular degeneration of right eye with inactive scar ([place])  H35.3213   3. Retinal edema  H35.81 OCT, Retina - OU - Both Eyes  4. Essential hypertension  I10   5. Hypertensive retinopathy of both eyes  H35.033   6. Pseudophakia of both eyes  Z96.1     1-3. 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    OD- w/ inactive scar -- history of low vision, CF 3'  OS- w/ CNV   - history of multiple IVL OS in [place], [place] w/ Dr. Doristine Church -- last injection 06/2018, q4-6 wk interval  - moved to [place] in November 2019 and has been followed by Dr. Baird Cancer who had recommended observation, thus pt sought second opinion  - given functional monocular status and history of active CNV, has been receiving maintenance injections, s/p IVA #1 OS (02.25.20), #2 (04.07.20), #3 (05.05.20), #4 (06.22.20), #5 (08.03.20), #6 (03.22.21)  - s/p IVE OS #1 (09.14.20), #2 (10.23.20), #3 (11.30.20), #4 (01.11.21), #5 (02.15.21), #6 (03.22.21), #7 (05.03.21), #8 (06.16.21), #9 (07.26.21), #10 (09.07.21), #11 (10.19.21), #12 (12.01.2021)  - OCT shows PEDs, CNV w/ stable improvement in cystic changes OS; OD with inactive scar  - BCVA stable at 20/50 OS   - recommend IVE #13 OS today, 01.19.22, maintenance w/ f/u in 7 wks (slow tx and ext)  - pt prefers aggressive maintenance given functional monocular stutus  - pt wishes to be treated with IVE OS  - RBA of procedure discussed, questions answered  - informed consent obtained   - Eylea informed consent form signed and scanned on 01.11.2021  - see procedure note  - Eylea4U benefits investigation started 9.14.20 -- approved for 2021  - f/u in 7 wks --  DFE, OCT, likely injection  4,5. Hypertensive retinopathy OU  - discussed importance of tight BP control  - monitor  6. Pseudophakia OU  - s/p CE/IOL OU  - monitor  Ophthalmic Meds Ordered this visit:  Meds ordered this encounter  Medications  . aflibercept (EYLEA) SOLN 2 mg       Return in about 7 weeks (around 11/13/2020) for f/u exu ARMD OU, DFE, OCT.  There are no Patient Instructions on file for this visit.   Explained the diagnoses, plan, and follow up with the patient and they expressed understanding.  Patient expressed understanding of the importance of proper follow up care.   This document serves as a record of services personally performed by Gardiner Sleeper, MD, PhD. It was created on their behalf by Roselee Nova, COMT. The creation of this record is the provider's dictation and/or activities during the visit.  Electronically signed by: Roselee Nova, COMT 09/25/20 9:18 PM   This document serves as a record of services personally performed by Gardiner Sleeper, MD, PhD. It was created on their behalf by San Jetty. Owens Shark, OA an ophthalmic technician. The creation of this record is the provider's dictation  and/or activities during the visit.    Electronically signed by: San Jetty. Owens Shark, [place] 01.19.2022 9:18 PM  Gardiner Sleeper, M.D., Ph.D. Diseases & Surgery of the Retina and Vitreous [facility]  I have reviewed the above documentation for accuracy and completeness, and I agree with the above. Gardiner Sleeper, M.D., Ph.D. 09/25/20 9:18 PM   Abbreviations: M myopia (nearsighted); A astigmatism; H hyperopia (farsighted); P presbyopia; Mrx spectacle prescription;  CTL contact lenses; OD right eye; OS left eye; OU both eyes  XT exotropia; ET esotropia; PEK punctate epithelial keratitis; PEE punctate epithelial erosions; DES dry eye syndrome; MGD meibomian gland dysfunction; ATs artificial tears; PFAT's preservative free artificial tears; [place] nuclear  sclerotic cataract; PSC posterior subcapsular cataract; ERM epi-retinal membrane; PVD posterior vitreous detachment; RD retinal detachment; DM diabetes mellitus; DR diabetic retinopathy; NPDR non-proliferative diabetic retinopathy; PDR proliferative diabetic retinopathy; CSME clinically significant macular edema; DME diabetic macular edema; dbh dot blot hemorrhages; CWS cotton wool spot; POAG primary open angle glaucoma; C/D cup-to-disc ratio; HVF humphrey visual field; GVF goldmann visual field; OCT optical coherence tomography; IOP intraocular pressure; BRVO Branch retinal vein occlusion; CRVO central retinal vein occlusion; CRAO central retinal artery occlusion; BRAO branch retinal artery occlusion; RT retinal tear; SB scleral buckle; PPV pars plana vitrectomy; VH Vitreous hemorrhage; PRP panretinal laser photocoagulation; IVK intravitreal kenalog; VMT vitreomacular traction; MH Macular hole;  NVD neovascularization of the disc; NVE neovascularization elsewhere; AREDS age related eye disease study; ARMD age related macular degeneration; POAG primary open angle glaucoma; EBMD epithelial/anterior basement membrane dystrophy; ACIOL anterior chamber intraocular lens; IOL intraocular lens; PCIOL posterior chamber intraocular lens; Phaco/IOL phacoemulsification with intraocular lens placement; [place] photorefractive keratectomy; LASIK laser assisted in situ keratomileusis; HTN hypertension; DM diabetes mellitus; COPD chronic obstructive pulmonary disease

## 2020-09-25 ENCOUNTER — Ambulatory Visit (INDEPENDENT_AMBULATORY_CARE_PROVIDER_SITE_OTHER): Payer: Medicare Other | Admitting: Ophthalmology

## 2020-09-25 ENCOUNTER — Encounter (INDEPENDENT_AMBULATORY_CARE_PROVIDER_SITE_OTHER): Payer: Self-pay | Admitting: Ophthalmology

## 2020-09-25 ENCOUNTER — Other Ambulatory Visit: Payer: Self-pay

## 2020-09-25 DIAGNOSIS — H3581 Retinal edema: Secondary | ICD-10-CM

## 2020-09-25 DIAGNOSIS — I10 Essential (primary) hypertension: Secondary | ICD-10-CM | POA: Diagnosis not present

## 2020-09-25 DIAGNOSIS — H35033 Hypertensive retinopathy, bilateral: Secondary | ICD-10-CM

## 2020-09-25 DIAGNOSIS — Z961 Presence of intraocular lens: Secondary | ICD-10-CM

## 2020-09-25 DIAGNOSIS — H353213 Exudative age-related macular degeneration, right eye, with inactive scar: Secondary | ICD-10-CM

## 2020-09-25 DIAGNOSIS — H353221 Exudative age-related macular degeneration, left eye, with active choroidal neovascularization: Secondary | ICD-10-CM | POA: Diagnosis not present

## 2020-09-25 MED ORDER — AFLIBERCEPT 2MG/0.05ML IZ SOLN FOR KALEIDOSCOPE
2.0000 mg | INTRAVITREAL | Status: AC | PRN
  Administered 2020-09-25: 2 mg via INTRAVITREAL

## 2020-09-26 LAB — CUP PACEART REMOTE DEVICE CHECK
Battery Impedance: 2444 Ohm
Battery Remaining Longevity: 29 mo
Battery Voltage: 2.73 V
Brady Statistic RV Percent Paced: 27 %
Date Time Interrogation Session: 20220118151037
Implantable Lead Implant Date: 20041008
Implantable Lead Implant Date: 20041008
Implantable Lead Location: 753859
Implantable Lead Location: 753860
Implantable Lead Model: 5076
Implantable Lead Model: 5076
Implantable Pulse Generator Implant Date: 20130328
Lead Channel Impedance Value: 67 Ohm
Lead Channel Impedance Value: 856 Ohm
Lead Channel Setting Pacing Amplitude: 2.5 V
Lead Channel Setting Pacing Pulse Width: 1 ms
Lead Channel Setting Sensing Sensitivity: 4 mV

## 2020-10-05 NOTE — Progress Notes (Signed)
Remote pacemaker transmission.   

## 2020-10-09 ENCOUNTER — Telehealth: Payer: Self-pay | Admitting: Internal Medicine

## 2020-10-09 ENCOUNTER — Other Ambulatory Visit: Payer: Self-pay | Admitting: *Deleted

## 2020-10-09 DIAGNOSIS — I71019 Dissection of thoracic aorta, unspecified: Secondary | ICD-10-CM

## 2020-10-09 DIAGNOSIS — I7101 Dissection of thoracic aorta: Secondary | ICD-10-CM

## 2020-10-09 NOTE — Telephone Encounter (Signed)
Patient calling to speak with [place]. Did not want me to take a message.

## 2020-10-15 IMAGING — CT CT ABD-PELV W/O CM
2 of 4 series · 15 of 46 positions shown, 17 images · non-contrast
Comparison: Chest CT dated 10/12/2019.

CLINICAL DATA: 89-year-old male with right lower quadrant
tenderness.

EXAM:
CT ABDOMEN AND PELVIS WITHOUT CONTRAST
TECHNIQUE: Multidetector CT imaging of the abdomen and pelvis was performed
following the standard protocol without IV contrast.

[Series 2: axial st · axial · 0.89mm/px · z∈[-689,-289]mm · 12 of 92 slices shown, 14 images]
[im 6/92  soft-tissue]
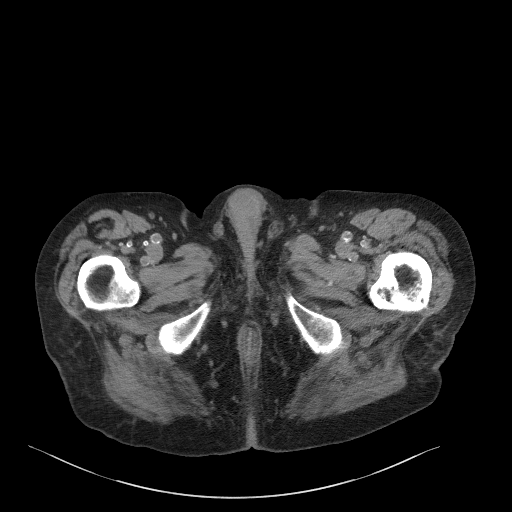
[im 6/92  bone]
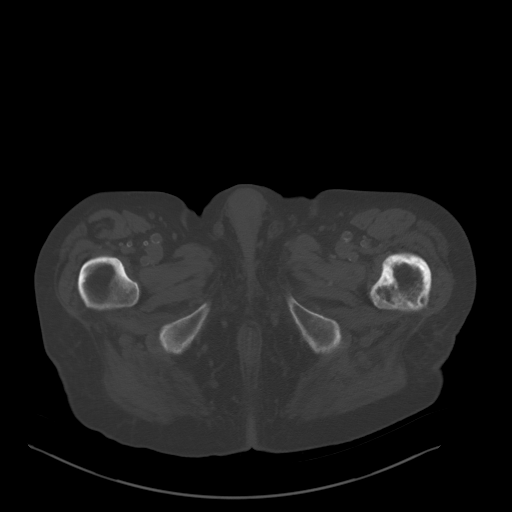
[im 12/92  soft-tissue]
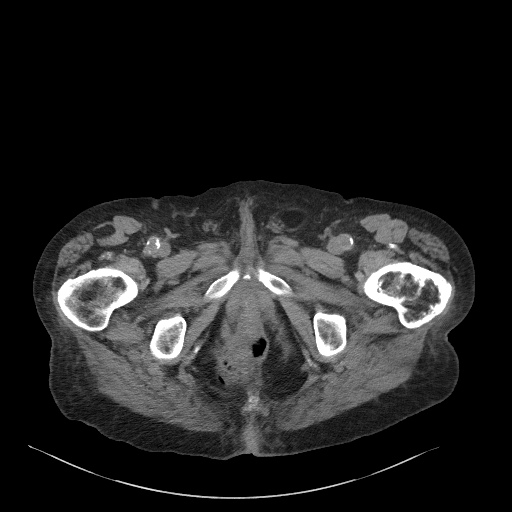
[im 23/92  soft-tissue]
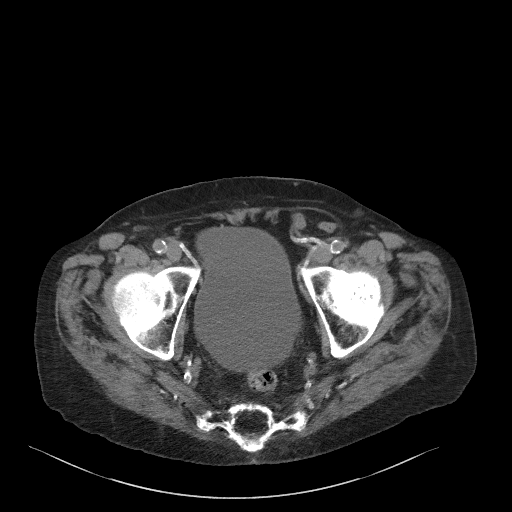
[im 29/92  soft-tissue]
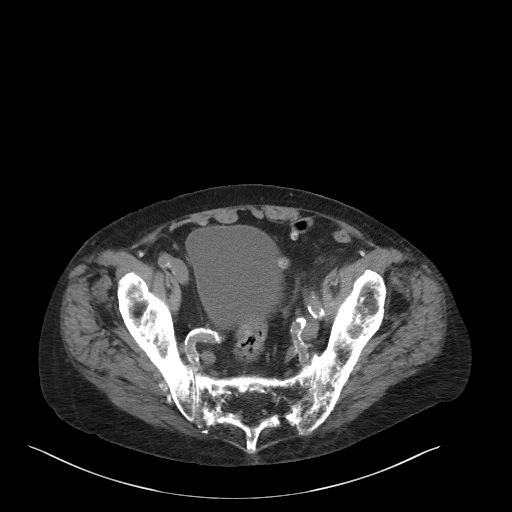
[im 35/92  soft-tissue]
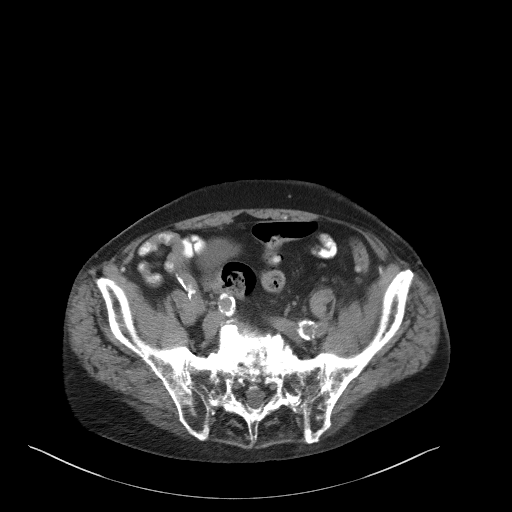
[im 40/92  soft-tissue]
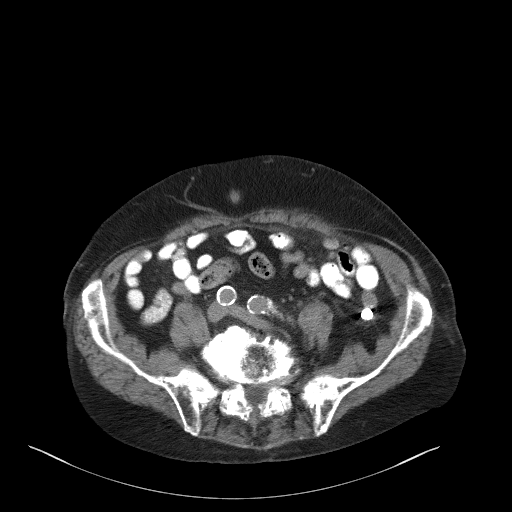
[im 52/92  soft-tissue]
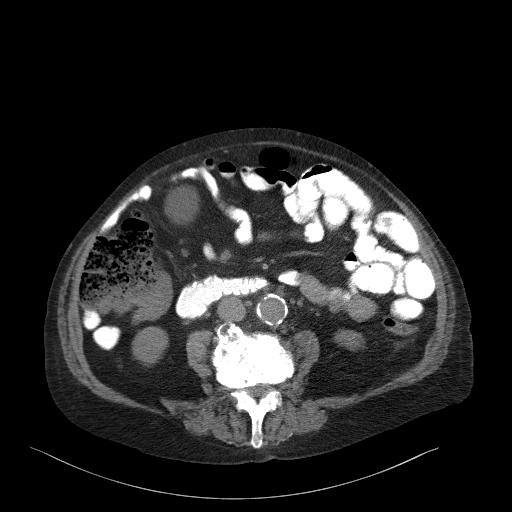
[im 57/92  soft-tissue]
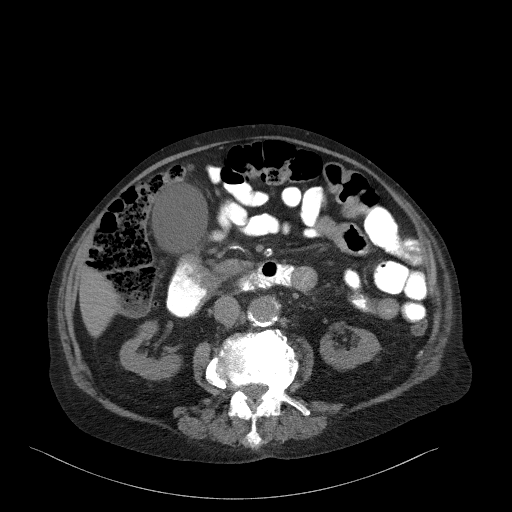
[im 63/92  soft-tissue]
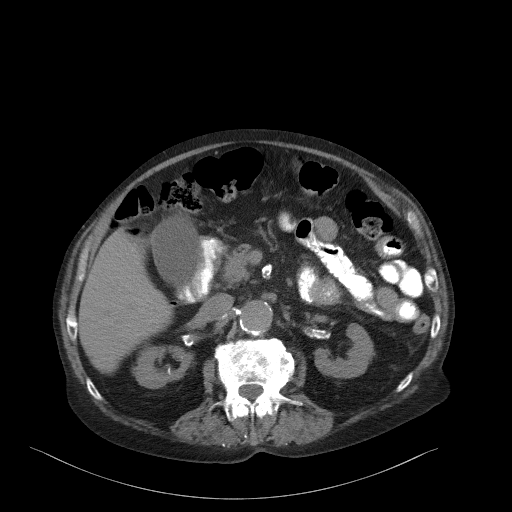
[im 63/92  bone]
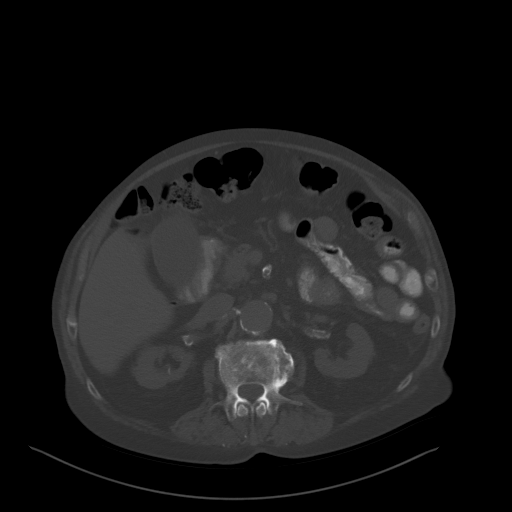
[im 69/92  soft-tissue]
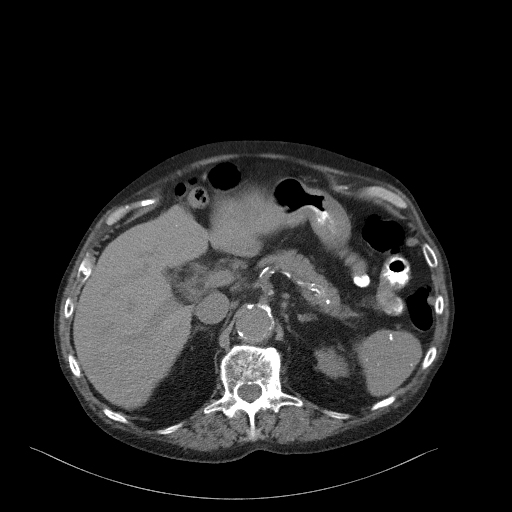
[im 80/92  soft-tissue]
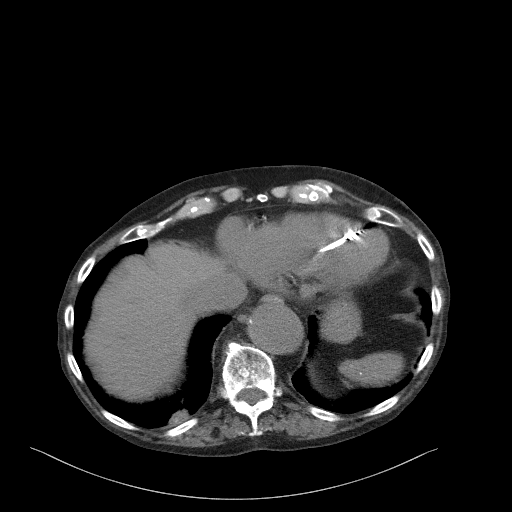
[im 86/92  soft-tissue]
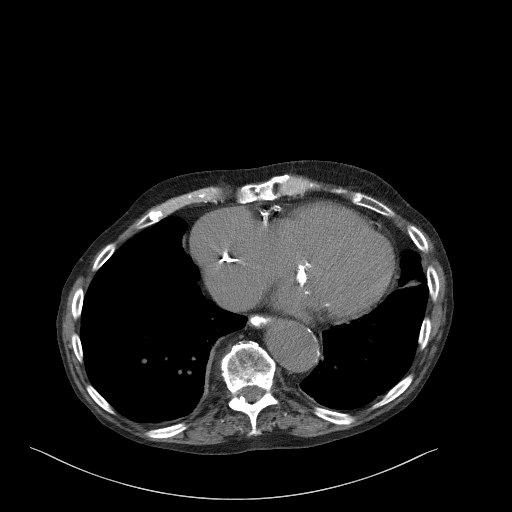

[Series 4: coronal st · coronal · 0.79mm/px · 3 of 104 slices shown]
[im 35/104  soft-tissue]
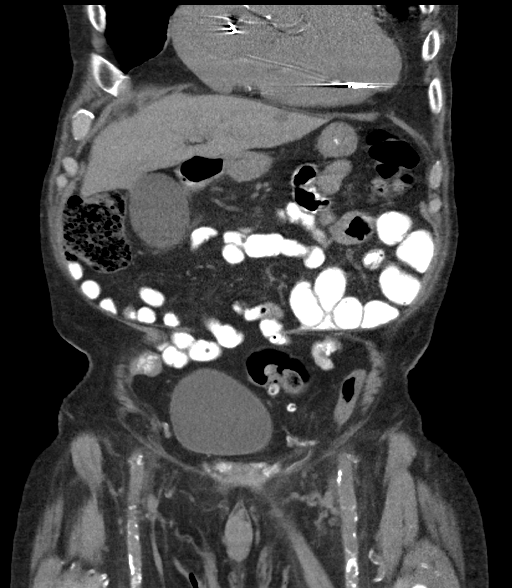
[im 46/104  soft-tissue]
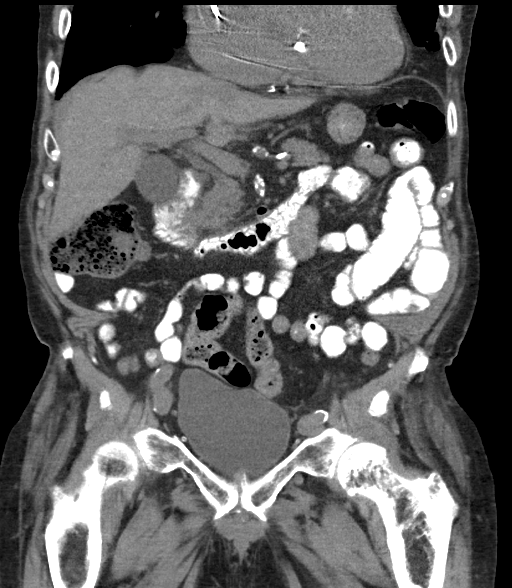
[im 58/104  soft-tissue]
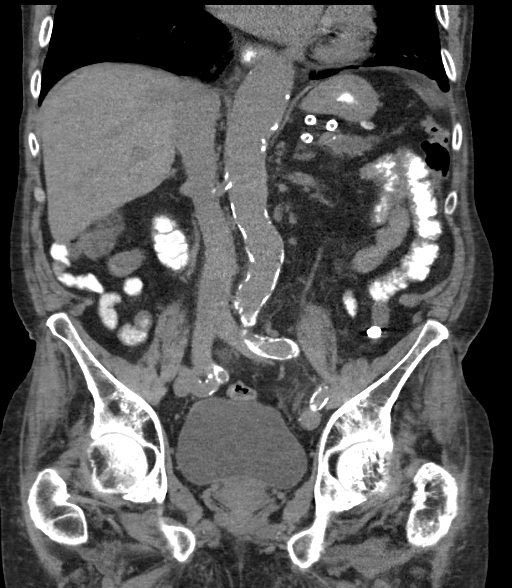

[15 of 46 positions shown; findings below may reference images not displayed]

FINDINGS: Evaluation of this exam is limited in the absence of intravenous
contrast.

Lower chest: Bibasilar atelectasis/scarring. Stable area of
subpleural atelectasis along the right lung base similar to prior
CT. Small left lung base calcified granuloma. There is stable
cardiomegaly. 3 vessel coronary vascular calcification and partially
visualized pacemaker wires.

No intra-abdominal free air or free fluid

Hepatobiliary: Small hypodense lesions within the liver are not
characterized on this CT but demonstrate fluid attenuation and
appears similar to prior CT, likely cysts. The liver is otherwise
unremarkable. The gallbladder is mildly distended. There is mild
haziness of the fat adjacent to the gallbladder. No calcified stone
identified. A faint small high attenuating focus in the right upper
quadrant (coronal 48/4) is likely vascular and outside of the
biliary tree. Ultrasound is recommended if there is clinical concern
for acute cholecystitis.

Pancreas: Unremarkable. No pancreatic ductal dilatation or
surrounding inflammatory changes.

Spleen: Small scattered calcified splenic granuloma.

Adrenals/Urinary Tract: The adrenal glands unremarkable. There is
mild bilateral renal parenchyma atrophy and cortical thinning. There
is no hydronephrosis on either side. Bilateral renal vascular
calcifications noted. No obstructing stone. The visualized ureters
and urinary bladder appear unremarkable.

Stomach/Bowel: There is sigmoid diverticulosis and small scattered
colonic diverticula without active inflammatory changes. There is
slight herniation of a short segment of sigmoid colon into the left
inguinal hernia without evidence of obstruction or inflammation.
Several small distal duodenal diverticula noted. The appendix is
normal.

Vascular/Lymphatic: Advanced aortoiliac atherosclerotic disease.
There is aneurysmal dilatation of the distal thoracic aorta
measuring approximately 4.6 cm in diameter at the level of the
diaphragmatic which appears slightly larger compared to the prior CT
and measuring approximately 4.6 cm in diameter (previously 4.3 cm).
The IVC is unremarkable. No portal venous gas. There is no
adenopathy.

Reproductive: The prostate and seminal vesicles are grossly
unremarkable.

Other: Small left inguinal hernia.  No fluid collection.

Musculoskeletal: Osteopenia with degenerative changes of the spine.
No acute osseous pathology.
IMPRESSION: 1. Mildly distended gallbladder with mild surrounding haziness. No
calcified stone identified. Ultrasound is recommended if there is
clinical concern for acute cholecystitis.
2. Colonic diverticulosis. No bowel obstruction or inflammation.
Normal appendix.
3. Slight herniation of a short segment of sigmoid colon into the
left inguinal hernia without evidence of obstruction or
inflammation.
4. Aneurysmal dilatation of the distal thoracic aorta measuring up
to 4.6 cm in diameter at the level of the diaphragmatic hiatus which
appears slightly larger compared to the prior CT.
5. Aortic Atherosclerosis ([id number]).

## 2020-10-18 IMAGING — US US ABDOMEN LIMITED
1 series · 14 of 25 positions shown · non-contrast
Comparison: CT scan 04/23/2020

CLINICAL DATA: Generalized abdominal pain since [REDACTED].

EXAM:
ULTRASOUND ABDOMEN LIMITED RIGHT UPPER QUADRANT

[Series 1: us abdomen limited ruq · 14 of 69 slices shown]
[im 1/69]
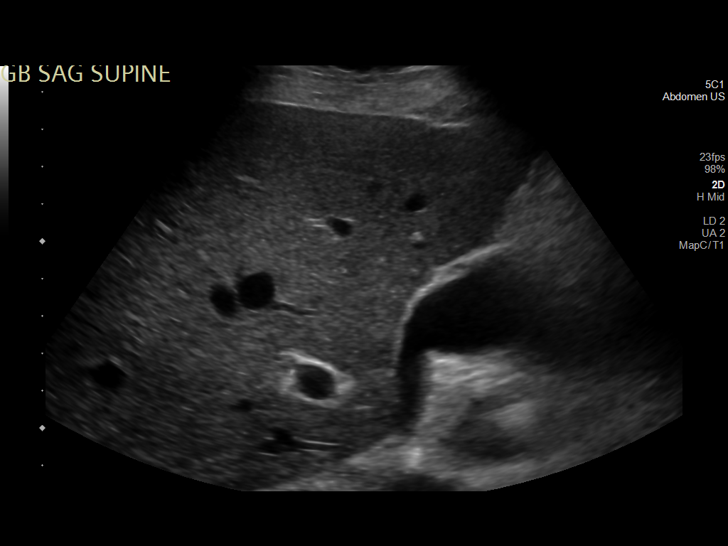
[im 6/69]
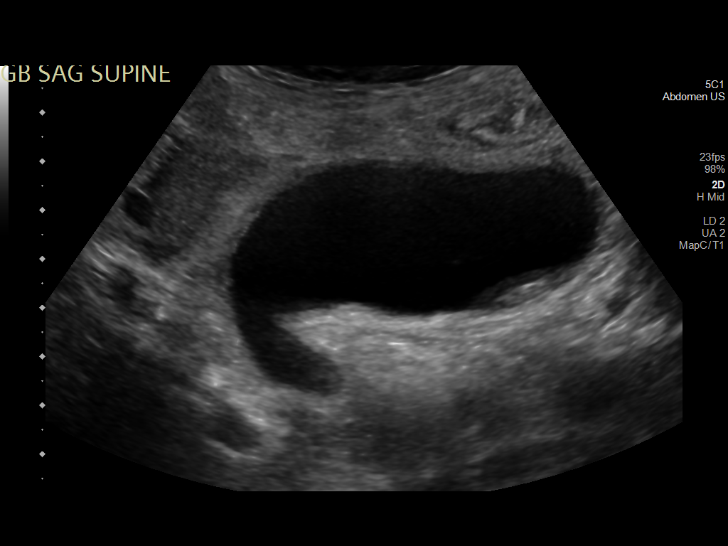
[im 12/69]
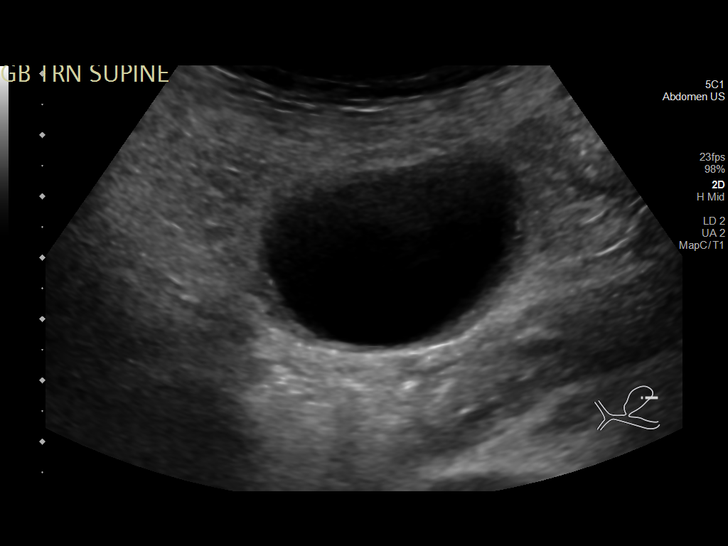
[im 18/69]
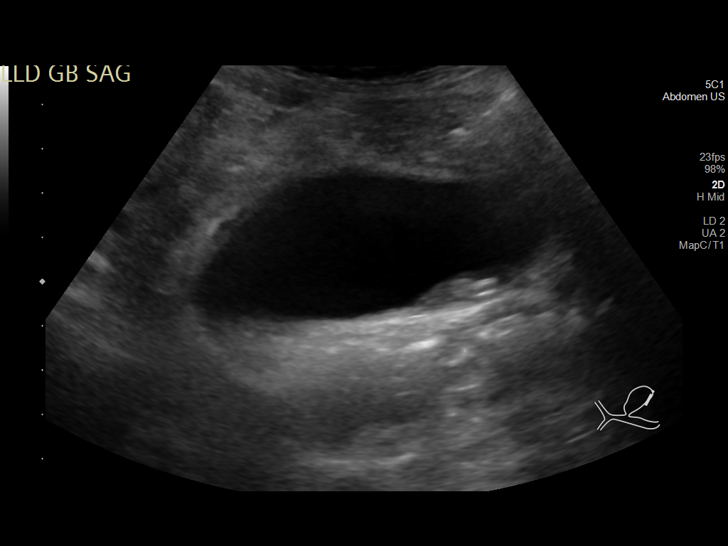
[im 23/69]
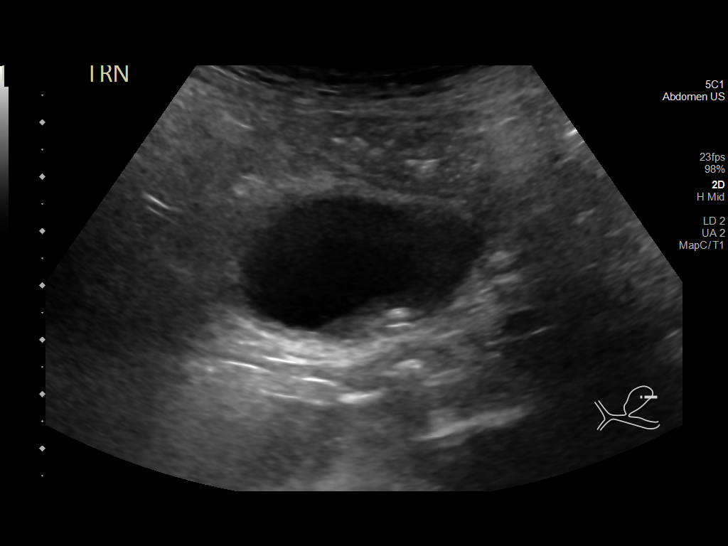
[im 26/69]
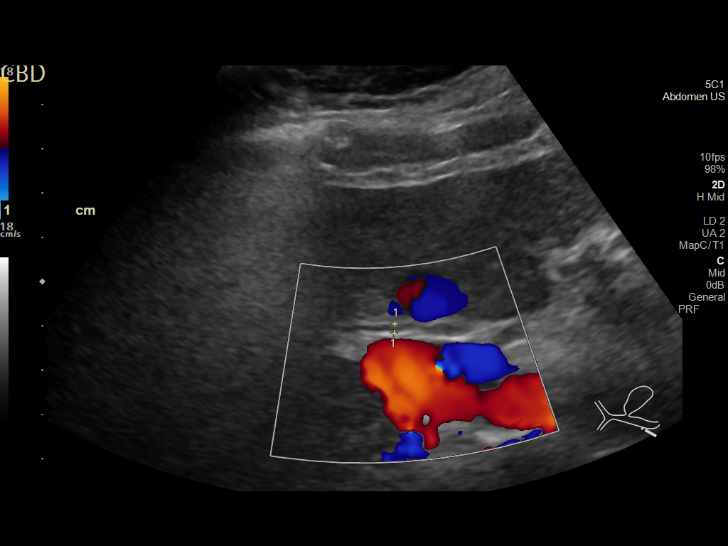
[im 32/69]
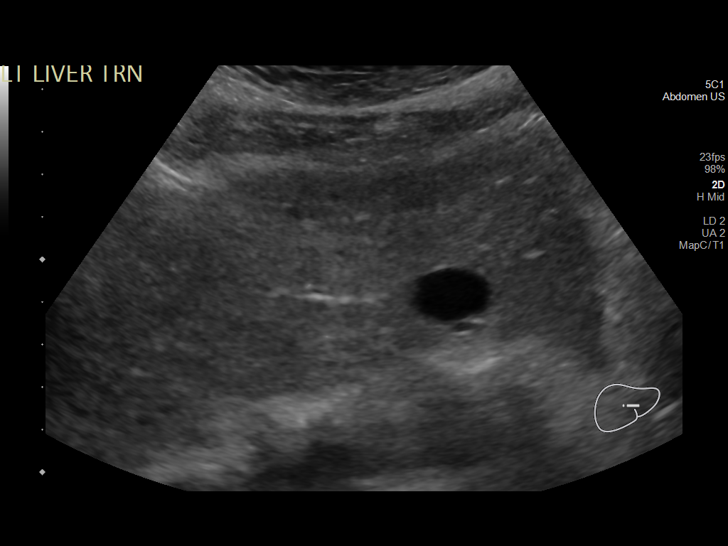
[im 37/69]
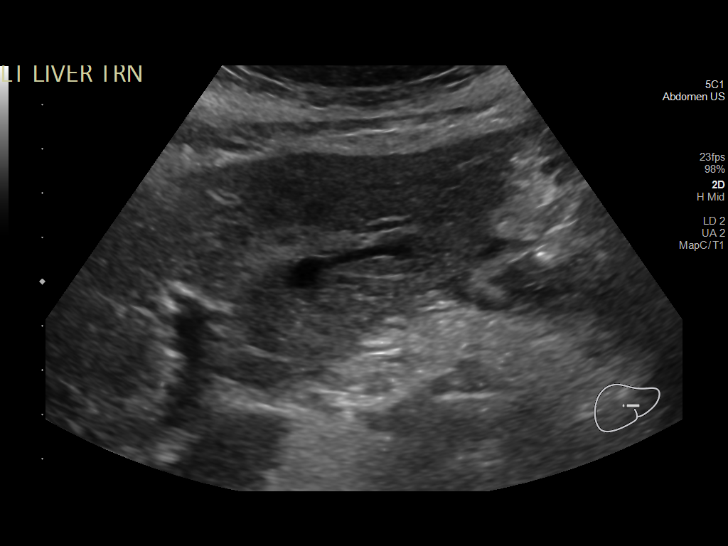
[im 43/69]
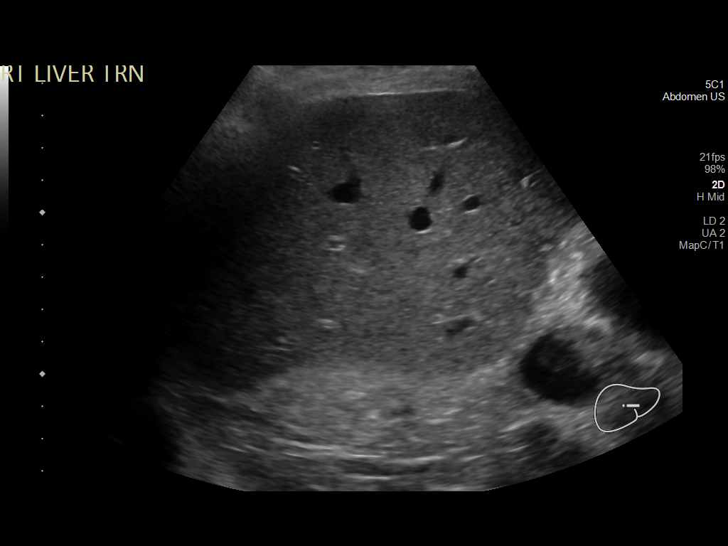
[im 46/69]
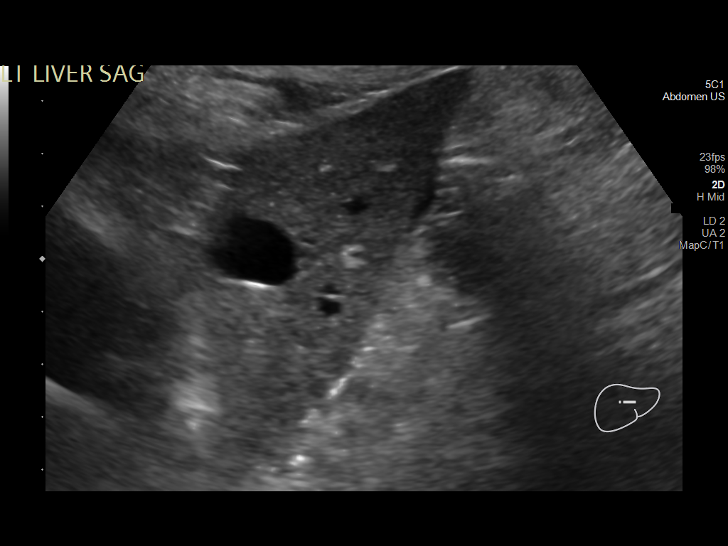
[im 52/69]
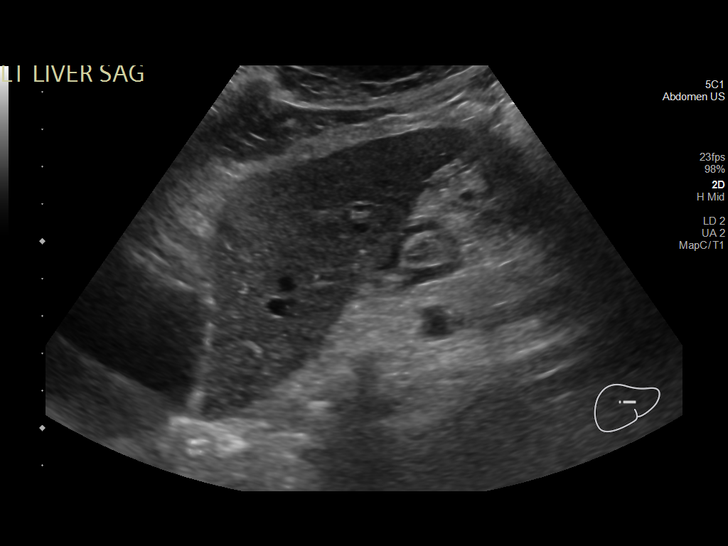
[im 57/69]
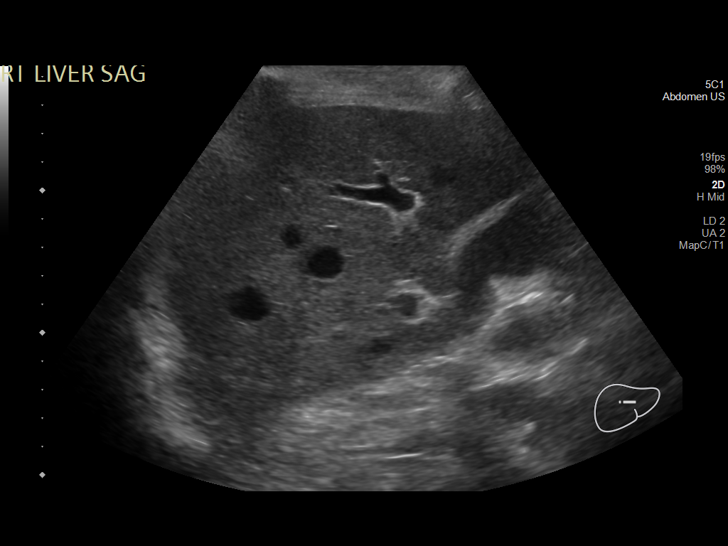
[im 63/69]
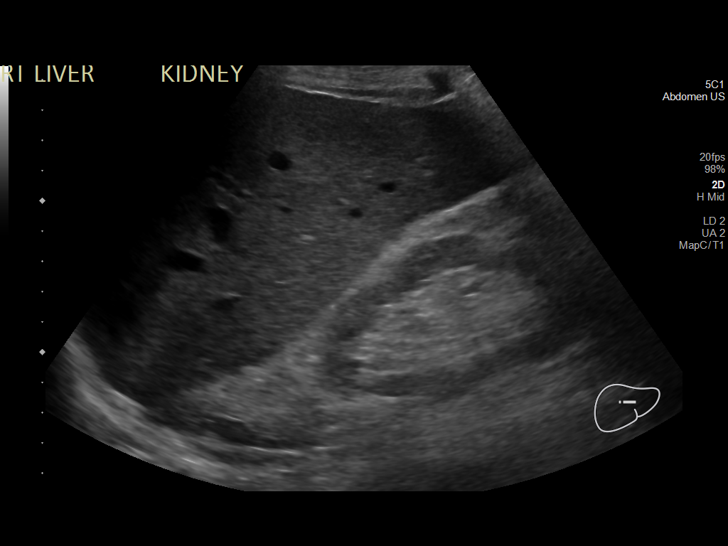
[im 69/69]
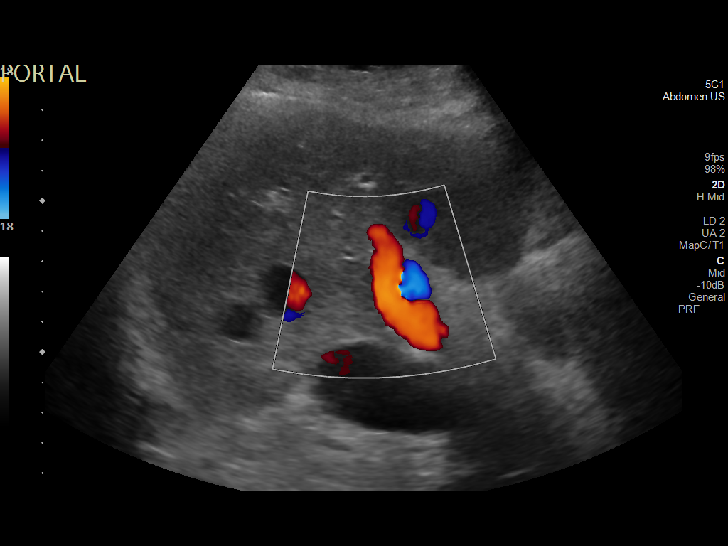

[14 of 25 positions shown; findings below may reference images not displayed]

FINDINGS: Gallbladder:

The gallbladder is mildly distended. Layering gallbladder sludge and
gallstones. Mild gallbladder wall thickening and edema in the
gallbladder wall. No sonographic Murphy sign but findings suspicious
for acute cholecystitis. Nuclear medicine biliary patency study may
be helpful for further evaluation.

Common bile duct:

Diameter: 2.1 mm

Liver:

Normal hepatic parenchymal echogenicity. Stable left hepatic lobe
cyst. No intrahepatic biliary dilatation. Portal vein is patent on
color Doppler imaging with normal direction of blood flow towards
the liver.

Other: None.
IMPRESSION: 1. Mildly distended gallbladder with sludge in gallstones. Mild
gallbladder wall thickening and edema in the gallbladder wall.
Findings suspicious for acute cholecystitis. Nuclear medicine
hepatobiliary scan may be helpful for evaluation of cystic duct
patency.
2. Normal sonographic appearance of the liver. Stable left hepatic
lobe cyst.

## 2020-10-23 ENCOUNTER — Ambulatory Visit (INDEPENDENT_AMBULATORY_CARE_PROVIDER_SITE_OTHER): Payer: Medicare Other | Admitting: Podiatry

## 2020-10-23 ENCOUNTER — Other Ambulatory Visit: Payer: Self-pay

## 2020-10-23 ENCOUNTER — Encounter: Payer: Self-pay | Admitting: Podiatry

## 2020-10-23 DIAGNOSIS — M79674 Pain in right toe(s): Secondary | ICD-10-CM

## 2020-10-23 DIAGNOSIS — M2041 Other hammer toe(s) (acquired), right foot: Secondary | ICD-10-CM

## 2020-10-23 DIAGNOSIS — L84 Corns and callosities: Secondary | ICD-10-CM | POA: Diagnosis not present

## 2020-10-23 DIAGNOSIS — M2042 Other hammer toe(s) (acquired), left foot: Secondary | ICD-10-CM

## 2020-10-23 DIAGNOSIS — B351 Tinea unguium: Secondary | ICD-10-CM | POA: Diagnosis not present

## 2020-10-23 DIAGNOSIS — I7123 Aneurysm of the descending thoracic aorta, without rupture: Secondary | ICD-10-CM | POA: Insufficient documentation

## 2020-10-23 DIAGNOSIS — Z0000 Encounter for general adult medical examination without abnormal findings: Secondary | ICD-10-CM | POA: Insufficient documentation

## 2020-10-23 DIAGNOSIS — M79675 Pain in left toe(s): Secondary | ICD-10-CM

## 2020-10-23 DIAGNOSIS — I739 Peripheral vascular disease, unspecified: Secondary | ICD-10-CM | POA: Diagnosis not present

## 2020-10-23 DIAGNOSIS — D6859 Other primary thrombophilia: Secondary | ICD-10-CM | POA: Insufficient documentation

## 2020-10-23 DIAGNOSIS — I712 Thoracic aortic aneurysm, without rupture: Secondary | ICD-10-CM | POA: Insufficient documentation

## 2020-10-24 ENCOUNTER — Ambulatory Visit (INDEPENDENT_AMBULATORY_CARE_PROVIDER_SITE_OTHER): Payer: Medicare Other | Admitting: Student

## 2020-10-24 ENCOUNTER — Encounter: Payer: Self-pay | Admitting: Student

## 2020-10-24 VITALS — BP 122/58 | HR 60 | Ht 72.0 in | Wt 193.0 lb

## 2020-10-24 DIAGNOSIS — I340 Nonrheumatic mitral (valve) insufficiency: Secondary | ICD-10-CM

## 2020-10-24 DIAGNOSIS — I4821 Permanent atrial fibrillation: Secondary | ICD-10-CM | POA: Diagnosis not present

## 2020-10-24 DIAGNOSIS — R001 Bradycardia, unspecified: Secondary | ICD-10-CM

## 2020-10-24 DIAGNOSIS — I5032 Chronic diastolic (congestive) heart failure: Secondary | ICD-10-CM | POA: Diagnosis not present

## 2020-10-24 NOTE — Patient Instructions (Signed)
Medication Instructions:  Your physician recommends that you continue on your current medications as directed. Please refer to the Current Medication list given to you today.  *If you need a refill on your cardiac medications before your next appointment, please call your pharmacy*   Lab Work: TODAY: BMET, BNP  If you have labs (blood work) drawn today and your tests are completely normal, you will receive your results only by: Marland Kitchen MyChart Message (if you have MyChart) OR . A paper copy in the mail If you have any lab test that is abnormal or we need to change your treatment, we will call you to review the results.   Follow-Up: At [facility], you and your health needs are our priority.  As part of our continuing mission to provide you with exceptional heart care, we have created designated Provider Care Teams.  These Care Teams include your primary Cardiologist (physician) and Advanced Practice Providers (APPs -  Physician Assistants and Nurse Practitioners) who all work together to provide you with the care you need, when you need it.  We recommend signing up for the patient portal called "MyChart".  Sign up information is provided on this After Visit Summary.  MyChart is used to connect with patients for Virtual Visits (Telemedicine).  Patients are able to view lab/test results, encounter notes, upcoming appointments, etc.  Non-urgent messages can be sent to your provider as well.   To learn more about what you can do with MyChart, go to [email or web address].    Your next appointment:   6 month(s)  The format for your next appointment:   In Person  Provider:   You may see Thompson Grayer, MD or one of the following Advanced Practice Providers on your designated Care Team:    Chanetta Marshall, NP  Tommye Standard, PA-C  Legrand Como "[place]" [place], [place]

## 2020-10-24 NOTE — Progress Notes (Signed)
Electrophysiology Office Note Date: 10/24/2020  ID:  Steven Williams, DOB 06/24/1930, MRN [id number]  PCP: [name], MD Primary Cardiologist: No primary care provider on file. Electrophysiologist: Thompson Grayer, MD   CC: Pacemaker follow-up  Steven Williams is a 87 y.o. male seen today for Thompson Grayer, MD for routine electrophysiology followup.  Since last being seen in our clinic the patient reports doing "about the same overall". He has mild SOB with moderate exertion, chronically. He is doing OK on lasix every other day. He is not sure if he has more SOB on his "non-lasix days". He drinks a lot of fluid. He drinks a vitamin waters, and several glasses/bottles of water.   Device History: Medtronic Dual Chamber PPM implanted 11/2011 for symptomatic bradycardia  Past Medical History:  Diagnosis Date  . Anticoagulated 06/26/2020  . Antiphospholipid antibody positive   . Aortic aneurysm [facility])    thoracic, Dr Roxan Hockey  . Aortic dissection ([place])   . BPH (benign prostatic hyperplasia)   . Central retinal artery occlusion   . Chronic anemia   . Chronic renal insufficiency   . COPD (chronic obstructive pulmonary disease) ([place])   . DDD (degenerative disc disease), cervical   . DJD (degenerative joint disease)   . DVT (deep venous thrombosis) ([place])   . Gastric dysmotility   . GERD (gastroesophageal reflux disease)   . Gout   . HTN (hypertension)   . Hyperlipidemia   . Hypertensive retinopathy    OU  . Hypothyroid   . Macular degeneration   . Myelodysplasia (myelodysplastic syndrome) ([place])   . Nasal septal deviation 06/26/2020  . Paget's disease of bone   . Peripheral neuropathy   . Permanent atrial fibrillation ([place])   . Recurrent epistaxis 06/26/2020  . Rheumatoid arthritis ([place])   . Symptomatic bradycardia   . TIA (transient ischemic attack)    Past Surgical History:  Procedure Laterality Date  . ATRIAL FIBRILLATION ABLATION  09/23/2014   Dr Encarnacion Chu in [place]  . BACK  SURGERY     lumbar, x 2  . CAROTID ENDARTERECTOMY Right 1990  . CAROTID STENT    . CATARACT EXTRACTION Bilateral 2010   [place]  . EYE SURGERY Bilateral 2010   [facility]  . LUNG DECORTICATION     VATS  . PACEMAKER GENERATOR CHANGE  12/03/2011   MDT [facility]pta ADDR01 generator change by Dr Encarnacion Chu for symptomatic bradycardia  . PACEMAKER IMPLANT  2004   MDT     Current Outpatient Medications  Medication Sig Dispense Refill  . Aflibercept (EYLEA) 2 MG/0.05ML SOLN 0.05 ml    . carvedilol (COREG) 6.25 MG tablet 1 tablet with food    . Cholecalciferol (VITAMIN D3) 50 MCG (2000 UT) TABS Take 1 tablet by mouth 2 (two) times daily.    Marland Kitchen dicyclomine (BENTYL) 10 MG capsule 2 capsules    . digoxin (LANOXIN) 0.125 MG tablet Take 0.125 mg by mouth as directed. Patient takes medication on M/W/F    . doxycycline (MONODOX) 100 MG capsule Take 100 mg by mouth 2 (two) times daily.    . Ferrous Sulfate (IRON) 325 (65 Fe) MG TABS Take 1 tablet by mouth 2 (two) times daily.    . furosemide (LASIX) 40 MG tablet 1 tablet    . levothyroxine (SYNTHROID) 100 MCG tablet Take 100 mcg by mouth every morning.    Marland Kitchen levothyroxine (SYNTHROID, LEVOTHROID) 88 MCG tablet Take 88 mcg by mouth daily before breakfast.    . Misc Natural Products (  GLUCOSAMINE CHOND COMPLEX/MSM PO) Take 1 tablet by mouth daily.    . Multiple Vitamins-Minerals (PRESERVISION AREDS PO) Take 1 tablet by mouth 2 (two) times daily.    . mupirocin ointment (BACTROBAN) 2 % 3 (three) times daily.    . NON FORMULARY Antifungal nail cream from [facility] - faxed 03/27/2019  [facility]    . oxymetazoline (AFRIN 12 HOUR) 0.05 % nasal spray 2 sprays in each nostril as needed    . pantoprazole (PROTONIX) 40 MG tablet Take by mouth.    . predniSONE (DELTASONE) 50 MG tablet Take 50 mg tablet 13 hours prior to CT scan, Take 50 mg tablet 7 hours prior to CT scan Take 50 mg tablet 1 hour prior to CT scan. 3 tablet 0  . pregabalin (LYRICA) 75 MG capsule Take  75 mg by mouth 2 (two) times daily.    . QC NO DRIP NASAL RELIEF 0.05 % nasal spray     . Ranibizumab (LUCENTIS) 0.3 MG/0.05ML SOLN inject into eye once monthly    . rOPINIRole (REQUIP) 2 MG tablet Take 2 mg by mouth at bedtime.    . rosuvastatin (CRESTOR) 10 MG tablet Take 10 mg by mouth daily.    . saw palmetto 500 MG capsule Take 500 mg by mouth 2 (two) times daily.    . silver nitrate applicators 09-73 % applicator Apply topically.    . tamsulosin (FLOMAX) 0.4 MG CAPS capsule Take 0.4 mg by mouth daily.    Marland Kitchen triamcinolone (NASACORT) 55 MCG/ACT AERO nasal inhaler 1 spray each nostril daily    . warfarin (COUMADIN) 2 MG tablet Take 2 mg by mouth daily.     Current Facility-Administered Medications  Medication Dose Route Frequency Provider Last Rate Last Admin  . Bevacizumab (AVASTIN) SOLN 1.25 mg  1.25 mg Intravitreal  [name], MD   1.25 mg at 11/01/18 2343    Allergies:   Iodinated diagnostic agents   Social History: Social History   Socioeconomic History  . Marital status: Widowed    Spouse name: Not on file  . Number of children: 3  . Years of education: Not on file  . Highest education level: Bachelor's degree (e.g., BA, AB, BS)  Occupational History  . Occupation: retired    Comment: retired [employer]  Tobacco Use  . Smoking status: Former Smoker    Packs/day: 2.00    Years: 12.00    Pack years: 24.00    Types: Cigarettes    Quit date: 09/07/1962    Years since quitting: 58.1  . Smokeless tobacco: Never Used  Vaping Use  . Vaping Use: Never used  Substance and Sexual Activity  . Alcohol use: Yes    Alcohol/week: 2.0 standard drinks    Types: 2 Glasses of wine per week    Comment: daily  . Drug use: Never  . Sexual activity: Not on file  Other Topics Concern  . Not on file  Social History Narrative   Previously lived in [place]   03/12/20 Now lives in [place] ([place]) [place]   Retired Chief Financial Officer   Caffeine 2 a day   Social Determinants of Adult nurse Strain: Not on file  Food Insecurity: Not on file  Transportation Needs: Not on file  Physical Activity: Not on file  Stress: Not on file  Social Connections: Not on file  Intimate Partner Violence: Not on file    Family History: Family History  Problem Relation Age of Onset  . Cirrhosis  Mother   . Stroke Father   . Lung cancer Sister   . Lung cancer Brother      Review of Systems: All other systems reviewed and are otherwise negative except as noted above.  Physical Exam: Vitals:   10/24/20 1057  BP: (!) 122/58  Pulse: 60  SpO2: 97%  Weight: 193 lb (87.5 kg)  Height: 6' (1.829 m)    Wt Readings from Last 3 Encounters:  10/24/20 193 lb (87.5 kg)  09/09/20 194 lb 12.8 oz (88.4 kg)  07/31/20 193 lb 6.4 oz (87.7 kg)   GEN- The patient is elderly appearing, alert and oriented x 3 today.   HEENT: normocephalic, atraumatic; sclera clear, conjunctiva pink; hearing intact; oropharynx clear; neck supple  Lungs- Clear to ausculation bilaterally, normal work of breathing.  No wheezes, rales, rhonchi Heart- Regular (paced) rate and rhythm, no murmurs, rubs or gallops  GI- soft, non-tender, non-distended, bowel sounds present  Extremities- no clubbing or cyanosis. No edema. + chronic venous stasis changes MS- no significant deformity or atrophy Skin- warm and dry, no rash or lesion; PPM pocket well healed Psych- euthymic mood, full affect Neuro- strength and sensation are intact  PPM Interrogation- reviewed in detail today,  See PACEART report  EKG:  EKG is ordered today. Shows V pacing at 60 with underlying AF (chronic)    Recent Labs: 03/08/2020: Hemoglobin 10.3; Platelets 82 09/20/2020: BUN 54; Creatinine, Ser 1.76; Potassium 4.5; Sodium 142   Wt Readings from Last 3 Encounters:  10/24/20 193 lb (87.5 kg)  09/09/20 194 lb 12.8 oz (88.4 kg)  07/31/20 193 lb 6.4 oz (87.7 kg)     Other studies Reviewed: Additional studies/ records that were  reviewed today include: Previous EP office notes, Previous remote checks, Most recent labwork.   Assessment and Plan:  1. Symptomatic bradycardia s/p Medtronic PPM  Normal PPM function by recent visit and remote.  Device not checked today.   2. Permanent Afib Rates well controlled overall.  Continue coumadin forCHA2DS2VASC ofat least 6 Continue digoxin.   3. HTN Stable.   4. High risk drug monitoring Recent digoxin level stable  5. Chronic diastolic CHF LVEF 05-69% 03/9479 Volume status stable on exam today. .  Continue lasix 40 mg every other day. Discussed sliding scale diuretics, sodium restriction, fluid restriction. He should take 20 mg lasix EXTRA as needed for weight gain of 3 lbs overnight, or 5 lbs within one week.  BMET today. Watch sodium.   Current medicines are reviewed at length with the patient today.   The patient does not have concerns regarding his medicines.  The following changes were made today:  none  Labs/ tests ordered today include:  No orders of the defined types were placed in this encounter.    Disposition:   Follow up with EP APP in 6 Months    Signed, Annamaria Helling  10/24/2020 11:23 AM  [facility] [address] [place] [address] [phone] (office) [phone] (fax)

## 2020-10-25 LAB — PRO B NATRIURETIC PEPTIDE: NT-Pro BNP: 4098 pg/mL — ABNORMAL HIGH (ref 0–486)

## 2020-10-25 LAB — BASIC METABOLIC PANEL
BUN/Creatinine Ratio: 28 — ABNORMAL HIGH (ref 10–24)
BUN: 53 mg/dL — ABNORMAL HIGH (ref 8–27)
CO2: 23 mmol/L (ref 20–29)
Calcium: 10.2 mg/dL (ref 8.6–10.2)
Chloride: 104 mmol/L (ref 96–106)
Creatinine, Ser: 1.92 mg/dL — ABNORMAL HIGH (ref 0.76–1.27)
GFR calc Af Amer: 35 mL/min/{1.73_m2} — ABNORMAL LOW (ref >59)
GFR calc non Af Amer: 30 mL/min/{1.73_m2} — ABNORMAL LOW (ref >59)
Glucose: 102 mg/dL — ABNORMAL HIGH (ref 65–99)
Potassium: 4.8 mmol/L (ref 3.5–5.2)
Sodium: 143 mmol/L (ref 134–144)

## 2020-10-27 NOTE — Progress Notes (Signed)
  Subjective:  Patient ID: Steven Williams, male    DOB: 1930/04/11,  MRN: [id number]  Steven Williams presents to clinic today for at risk foot care. Patient has history of clotting disorder and neuropathy and painful thick toenails that are difficult to trim. Pain interferes with ambulation. Aggravating factors include wearing enclosed shoe gear. Pain is relieved with periodic professional debridement..  87 y.o. male presents with the above complaint.    Review of Systems: Negative except as noted in the HPI.  PCP is Dr. [name]. Last visit was 10/21/2020.  Allergies  Allergen Reactions  . Iodinated Diagnostic Agents Rash    Objective:   Constitutional Steven Williams is a pleasant 87 y.o. Caucasian male, WD, WN in NAD. AAO x 3.   Vascular Capillary refill time to digits <4 seconds b/l lower extremities. Nonpalpable DP pulse(s) b/l lower extremities. Nonpalpable PT pulse(s) b/l lower extremities. Pedal hair absent. Lower extremity skin temperature gradient warm to cool. No cyanosis or clubbing noted.  Neurologic Normal speech. Oriented to person, place, and time. Pt has subjective symptoms of neuropathy. Protective sensation intact 5/5 intact bilaterally with 10g monofilament b/l. Vibratory sensation intact b/l.  Dermatologic Pedal skin is thin shiny, atrophic b/l lower extremities. No open wounds bilaterally. No interdigital macerations bilaterally. Toenails 1-5 b/l elongated, discolored, dystrophic, thickened, crumbly with subungual debris and tenderness to dorsal palpation. Hyperkeratotic lesion(s) submet head 1 right foot.  No erythema, no edema, no drainage, no fluctuance.  Orthopedic: Normal muscle strength 5/5 to all lower extremity muscle groups bilaterally. No pain crepitus or joint limitation noted with ROM b/l. Hammertoes noted to the 1-5 bilaterally.   Radiographs: None Assessment:   1. Pain due to onychomycosis of toenails of both feet   2. Callus   3. Acquired hammertoes of  both feet   4. PAD (peripheral artery disease) ([place])    Plan:  Patient was evaluated and treated and all questions answered.  Onychomycosis with pain -Nails palliatively debridement as below -Educated on self-care  Procedure: Nail Debridement Rationale: Pain Type of Debridement: manual, sharp debridement. Instrumentation: Nail nipper, rotary burr. Number of Nails: 10 -Examined patient. -No new findings. No new orders. -Patient to continue soft, supportive shoe gear daily. -Toenails 1-5 b/l were debrided in length and girth with sterile nail nippers and dremel without iatrogenic bleeding.  -Callus(es) submet head 1 right foot pared utilizing sterile scalpel blade without complication or incident. Total number debrided =1. -Patient to report any pedal injuries to medical professional immediately. -Dispensed hook on foam metatarsal pad for right foot. Apply every morning. Remove every evening. -Patient/POA to call should there be question/concern in the interim.  Return in about 3 months (around 01/20/2021).  Steven Williams, DPM

## 2020-10-29 ENCOUNTER — Other Ambulatory Visit: Payer: Self-pay

## 2020-10-29 MED ORDER — DIPHENHYDRAMINE HCL 50 MG PO CAPS: 50.0000 mg | ORAL_CAPSULE | Freq: Once | ORAL | Status: DC

## 2020-10-29 MED ORDER — PREDNISONE 50 MG PO TABS
ORAL_TABLET | ORAL | 0 refills | Status: DC
Start: 2020-10-29 — End: 2021-05-14

## 2020-10-29 NOTE — Progress Notes (Signed)
13 hour prep escribed to [facility] for contrast allergy (rash)

## 2020-11-06 ENCOUNTER — Ambulatory Visit
Admission: RE | Admit: 2020-11-06 | Discharge: 2020-11-06 | Disposition: A | Discharge status: Home / Self Care | Payer: Medicare Other | Source: Ambulatory Visit | Attending: Thoracic Surgery (Cardiothoracic Vascular Surgery) | Admitting: Thoracic Surgery (Cardiothoracic Vascular Surgery)

## 2020-11-06 DIAGNOSIS — I7101 Dissection of thoracic aorta: Secondary | ICD-10-CM

## 2020-11-06 DIAGNOSIS — I71019 Dissection of thoracic aorta, unspecified: Secondary | ICD-10-CM

## 2020-11-12 ENCOUNTER — Ambulatory Visit (INDEPENDENT_AMBULATORY_CARE_PROVIDER_SITE_OTHER): Payer: Medicare Other | Admitting: Thoracic Surgery (Cardiothoracic Vascular Surgery)

## 2020-11-12 ENCOUNTER — Other Ambulatory Visit: Payer: Self-pay

## 2020-11-12 ENCOUNTER — Encounter: Payer: Self-pay | Admitting: Thoracic Surgery (Cardiothoracic Vascular Surgery)

## 2020-11-12 VITALS — BP 124/65 | HR 56 | Resp 20 | Ht 72.0 in

## 2020-11-12 DIAGNOSIS — I7101 Dissection of thoracic aorta: Secondary | ICD-10-CM

## 2020-11-12 DIAGNOSIS — I71019 Dissection of thoracic aorta, unspecified: Secondary | ICD-10-CM

## 2020-11-12 NOTE — Progress Notes (Signed)
[facility] Note  11/13/2020     CHIEF COMPLAINT Patient presents for Retina Follow Up   HISTORY OF PRESENT ILLNESS: Steven Williams is a 87 y.o. male who presents to the clinic today for:   HPI    Retina Follow Up    Patient presents with  Wet AMD.  In both eyes.  Severity is moderate.  Duration of 7 weeks.  Since onset it is stable.  I, the attending physician,  performed the HPI with the patient and updated documentation appropriately.          Comments    Pt feels like vision might be "marginally worse" since he was here last, he denies new floaters or fol       Last edited by [name], MD on 11/13/2020  1:52 PM. (History)    pt states   Referring physician:   HISTORICAL INFORMATION:   Selected notes from the [facility] Referred by [name], [place]   CURRENT MEDICATIONS: Current Outpatient Medications (Ophthalmic Drugs)  Medication Sig  . Aflibercept (EYLEA) 2 MG/0.05ML SOLN 0.05 ml  . Ranibizumab (LUCENTIS) 0.3 MG/0.05ML SOLN inject into eye once monthly   No current facility-administered medications for this visit. (Ophthalmic Drugs)   Current Outpatient Medications (Other)  Medication Sig  . Vitamin D-Vitamin K (VITAMIN K2-VITAMIN D3) 45-2000 MCG-UNIT CAPS take 1 po bid  . carvedilol (COREG) 6.25 MG tablet 1 tablet with food  . Cholecalciferol (VITAMIN D3) 50 MCG (2000 UT) TABS Take 1 tablet by mouth 2 (two) times daily.  Marland Kitchen dicyclomine (BENTYL) 10 MG capsule 2 capsules  . digoxin (LANOXIN) 0.125 MG tablet Take 0.125 mg by mouth as directed. Patient takes medication on M/W/F  . Digoxin 62.5 MCG TABS 1 tablet  . doxycycline (MONODOX) 100 MG capsule Take 100 mg by mouth 2 (two) times daily.  . Ferrous Sulfate (IRON) 325 (65 Fe) MG TABS Take 1 tablet by mouth 2 (two) times daily.  . furosemide (LASIX) 40 MG tablet 1 tablet  . levothyroxine (SYNTHROID) 100 MCG tablet Take 100 mcg by mouth every morning.  Marland Kitchen  levothyroxine (SYNTHROID, LEVOTHROID) 88 MCG tablet Take 88 mcg by mouth daily before breakfast.  . Misc Natural Products (GLUCOSAMINE CHOND COMPLEX/MSM PO) Take 1 tablet by mouth daily.  . Multiple Vitamins-Minerals (PRESERVISION AREDS PO) Take 1 tablet by mouth 2 (two) times daily.  . mupirocin ointment (BACTROBAN) 2 % 3 (three) times daily.  . NON FORMULARY Antifungal nail cream from [facility] - faxed 03/27/2019  [facility]  . oxymetazoline (AFRIN 12 HOUR) 0.05 % nasal spray 2 sprays in each nostril as needed  . pantoprazole (PROTONIX) 40 MG tablet Take by mouth.  . predniSONE (DELTASONE) 50 MG tablet Take 50 mg tablet 13 hours prior to CT scan, Take 50 mg tablet 7 hours prior to CT scan Take 50 mg tablet 1 hour prior to CT scan.  . predniSONE (DELTASONE) 50 MG tablet Take 50mg  prednisone on 3/1 @ 10:20pm.  Take 50mg  prednisone on 3/2 @ 4:20am.  Take 50mg  prednisone AND 50mg  benadryl on 3/2 @ 10:20am.  . pregabalin (LYRICA) 75 MG capsule Take 75 mg by mouth 2 (two) times daily.  . QC NO DRIP NASAL RELIEF 0.05 % nasal spray   . rOPINIRole (REQUIP) 2 MG tablet Take 2 mg by mouth at bedtime.  . rosuvastatin (CRESTOR) 10 MG tablet Take 10 mg by mouth daily.  . saw palmetto 500 MG capsule Take  500 mg by mouth 2 (two) times daily.  . silver nitrate applicators 32-99 % applicator Apply topically.  . tamsulosin (FLOMAX) 0.4 MG CAPS capsule Take 0.4 mg by mouth daily.  Marland Kitchen triamcinolone (NASACORT) 55 MCG/ACT AERO nasal inhaler 1 spray each nostril daily  . warfarin (COUMADIN) 2 MG tablet Take 2 mg by mouth daily.   Current Facility-Administered Medications (Other)  Medication Route  . Bevacizumab (AVASTIN) SOLN 1.25 mg Intravitreal  . diphenhydrAMINE (BENADRYL) capsule 50 mg Oral      REVIEW OF SYSTEMS: ROS    Positive for: Cardiovascular, Eyes, Heme/Lymph   Negative for: Constitutional, Gastrointestinal, Neurological, Skin, Genitourinary, Musculoskeletal, HENT, Endocrine, Respiratory,  Psychiatric, Allergic/Imm   Last edited by Debbrah Alar, COT on 11/13/2020  1:22 PM. (History)       ALLERGIES Allergies  Allergen Reactions  . Iodinated Diagnostic Agents Rash    PAST MEDICAL HISTORY Past Medical History:  Diagnosis Date  . Anticoagulated 06/26/2020  . Antiphospholipid antibody positive   . Aortic aneurysm [facility])    thoracic, Dr Roxan Hockey  . Aortic dissection ([place])   . BPH (benign prostatic hyperplasia)   . Central retinal artery occlusion   . Chronic anemia   . Chronic renal insufficiency   . COPD (chronic obstructive pulmonary disease) ([place])   . DDD (degenerative disc disease), cervical   . DJD (degenerative joint disease)   . DVT (deep venous thrombosis) ([place])   . Gastric dysmotility   . GERD (gastroesophageal reflux disease)   . Gout   . HTN (hypertension)   . Hyperlipidemia   . Hypertensive retinopathy    OU  . Hypothyroid   . Macular degeneration   . Myelodysplasia (myelodysplastic syndrome) ([place])   . Nasal septal deviation 06/26/2020  . Paget's disease of bone   . Peripheral neuropathy   . Permanent atrial fibrillation ([place])   . Recurrent epistaxis 06/26/2020  . Rheumatoid arthritis ([place])   . Symptomatic bradycardia   . TIA (transient ischemic attack)    Past Surgical History:  Procedure Laterality Date  . ATRIAL FIBRILLATION ABLATION  09/23/2014   Dr Encarnacion Chu in [place]  . BACK SURGERY     lumbar, x 2  . CAROTID ENDARTERECTOMY Right 1990  . CAROTID STENT    . CATARACT EXTRACTION Bilateral 2010   [place]  . EYE SURGERY Bilateral 2010   [facility]  . LUNG DECORTICATION     VATS  . PACEMAKER GENERATOR CHANGE  12/03/2011   MDT Adapta [id number] generator change by Dr Encarnacion Chu for symptomatic bradycardia  . PACEMAKER IMPLANT  2004   MDT     FAMILY HISTORY Family History  Problem Relation Age of Onset  . Cirrhosis Mother   . Stroke Father   . Lung cancer Sister   . Lung cancer Brother     SOCIAL HISTORY Social History   Tobacco  Use  . Smoking status: Former Smoker    Packs/day: 2.00    Years: 12.00    Pack years: 24.00    Types: Cigarettes    Quit date: 09/07/1962    Years since quitting: 58.2  . Smokeless tobacco: Never Used  Vaping Use  . Vaping Use: Never used  Substance Use Topics  . Alcohol use: Yes    Alcohol/week: 2.0 standard drinks    Types: 2 Glasses of wine per week    Comment: daily  . Drug use: Never         OPHTHALMIC EXAM:  Base Eye Exam  Visual Acuity (Snellen - Linear)      Right Left   Dist cc CF at 3' 20/50 +2   Dist ph cc NI NI   Correction: Glasses       Tonometry (Tonopen, 1:30 PM)      Right Left   Pressure 15 13       Pupils      Dark Light Shape React APD   Right 2 1 Round Sluggish None   Left 2 1 Round Sluggish None       Visual Fields (Counting fingers)      Left Right    Full Full       Extraocular Movement      Right Left    Full, Ortho Full, Ortho       Neuro/Psych    Oriented x3: Yes   Mood/Affect: Normal       Dilation    Both eyes: 1.0% Mydriacyl, 2.5% Phenylephrine @ 1:30 PM        Slit Lamp and Fundus Exam    Slit Lamp Exam      Right Left   Lids/Lashes Dermatochalasis - upper lid, Meibomian gland dysfunction Dermatochalasis - upper lid, Meibomian gland dysfunction   Conjunctiva/Sclera White and quiet White and quiet   Cornea Arcus, 1+Punctate epithelial erosions, Well healed cataract wounds Arcus, 1-2+inferior Punctate epithelial erosions centrally, Well healed cataract wounds, mild endopigment   Anterior Chamber Deep and quiet Deep and quiet   Iris Round and moderately dilated Round and moderately dilated   Lens Posterior chamber intraocular lens, 1+ non-central Posterior capsular opacification Posterior chamber intraocular lens, open PC   Vitreous Vitreous syneresis, Posterior vitreous detachment Vitreous syneresis       Fundus Exam      Right Left   Disc +cupping, trace pallor, thin superior rim, 360 PPA mild pallor, 360 PPA,  +cupping   C/D Ratio 0.7 0.7   Macula Flat, diffuse RPE atrophy, central pigment clumping, No heme or edema Flat, Blunted foveal reflex, Drusen, RPE mottling, clumping and atrophy, No heme or edema, trace cystic changes -- stably improved, peripapillary GA nasal macula   Vessels Vascular attenuation Vascular attenuation, Tortuous   Periphery Attached, mild reticular degeneration, mild paving stone inferiorly Attached, mild reticular degeneration          IMAGING AND PROCEDURES  Imaging and Procedures for @TODAY @  OCT, Retina - OU - Both Eyes       Right Eye Quality was good. Central Foveal Thickness: 243. Progression has been stable. Findings include abnormal foveal contour, no IRF, no SRF, subretinal hyper-reflective material, retinal drusen , outer retinal atrophy, pigment epithelial detachment.   Left Eye Quality was good. Central Foveal Thickness: 252. Progression has been stable. Findings include abnormal foveal contour, no SRF, outer retinal atrophy, retinal drusen , pigment epithelial detachment, subretinal hyper-reflective material, no IRF (Stable improvement in cystic changes, stable GA).   Notes *Images captured and stored on drive  Diagnosis / Impression:  Exudative ARMD OU OD: +atrophic macular scar - stable OS: Stable improvement in cystic changes, stable GA  Clinical management:  See below  Abbreviations: NFP - Normal foveal profile. CME - cystoid macular edema. PED - pigment epithelial detachment. IRF - intraretinal fluid. SRF - subretinal fluid. EZ - ellipsoid zone. ERM - epiretinal membrane. ORA - outer retinal atrophy. ORT - outer retinal tubulation. SRHM - subretinal hyper-reflective material        Intravitreal Injection, Pharmacologic Agent - OS - Left Eye  Time Out 11/13/2020. 1:56 PM. Confirmed correct patient, procedure, site, and patient consented.   Anesthesia Topical anesthesia was used. Anesthetic medications included Lidocaine 2%,  Proparacaine 0.5%.   Procedure Preparation included 5% betadine to ocular surface, eyelid speculum. A (32g) needle was used.   Injection:  2 mg aflibercept Alfonse Flavors) SOLN   NDC: A3590391, Lot: [id number], Expiration date: 03/06/2021   Route: Intravitreal, Site: Left Eye, Waste: 0.05 mg  Post-op Post injection exam found visual acuity of at least counting fingers. The patient tolerated the procedure well. There were no complications. The patient received written and verbal post procedure care education. Post injection medications were not given.                 ASSESSMENT/PLAN:    ICD-10-CM   1. Exudative age-related macular degeneration of left eye with active choroidal neovascularization (HCC)  H35.3221 Intravitreal Injection, Pharmacologic Agent - OS - Left Eye    aflibercept (EYLEA) SOLN 2 mg  2. Exudative age-related macular degeneration of right eye with inactive scar ([place])  H35.3213   3. Retinal edema  H35.81 OCT, Retina - OU - Both Eyes  4. Essential hypertension  I10   5. Hypertensive retinopathy of both eyes  H35.033   6. Pseudophakia of both eyes  Z96.1     1-3. 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    OD- w/ inactive scar -- history of low vision, CF 3'  OS- w/ CNV   - history of multiple IVL OS in [place], [place] w/ Dr. Doristine Church -- last injection 06/2018, q4-6 wk interval  - moved to [place] in November 2019 and has been followed by Dr. Baird Cancer who had recommended observation, thus pt sought second opinion  - given functional monocular status and history of active CNV, has been receiving maintenance injections, s/p IVA #1 OS (02.25.20), #2 (04.07.20), #3 (05.05.20), #4 (06.22.20), #5 (08.03.20), #6 (03.22.21)  - s/p IVE OS #1 (09.14.20), #2 (10.23.20), #3 (11.30.20), #4 (01.11.21), #5 (02.15.21), #6 (03.22.21), #7 (05.03.21), #8 (06.16.21), #9 (07.26.21), #10 (09.07.21), #11 (10.19.21), #12 (12.01.2021), #13 (01.19.22)  - OCT shows PEDs, CNV w/  stable improvement in cystic changes OS; OD with inactive scar  - BCVA stable at 20/50 OS   - recommend IVE #14 OS today, 03.09.22, maintenance w/ f/u in 8 wks (slow tx and ext)  - pt prefers aggressive maintenance given functional monocular stutus  - pt wishes to be treated with IVE OS  - RBA of procedure discussed, questions answered  - informed consent obtained   - Eylea informed consent form signed and scanned on 01.11.2021  - see procedure note  - [email or web address] benefits investigation started 9.14.20 -- approved for 2021  - f/u in 8 wks -- DFE, OCT, likely injection  4,5. Hypertensive retinopathy OU  - discussed importance of tight BP control  - monitor  6. Pseudophakia OU  - s/p CE/IOL OU  - monitor  Ophthalmic Meds Ordered this visit:  Meds ordered this encounter  Medications  . aflibercept (EYLEA) SOLN 2 mg       Return in about 8 weeks (around 01/08/2021) for f/u exu ARMD OU, DFE, OCT.  There are no Patient Instructions on file for this visit.   Explained the diagnoses, plan, and follow up with the patient and they expressed understanding.  Patient expressed understanding of the importance of proper follow up care.   This document serves as a record of services personally performed by Gardiner Sleeper, MD, PhD.  It was created on their behalf by Roselee Nova, COMT. The creation of this record is the provider's dictation and/or activities during the visit.  Electronically signed by: Roselee Nova, COMT 11/13/20 2:03 PM    Gardiner Sleeper, M.D., Ph.D. Diseases & Surgery of the Retina and Vitreous [facility]  I have reviewed the above documentation for accuracy and completeness, and I agree with the above. Gardiner Sleeper, M.D., Ph.D. 11/13/20 2:03 PM   Abbreviations: M myopia (nearsighted); A astigmatism; H hyperopia (farsighted); P presbyopia; Mrx spectacle prescription;  CTL contact lenses; OD right eye; OS left eye; OU both eyes  XT exotropia; ET  esotropia; PEK punctate epithelial keratitis; PEE punctate epithelial erosions; DES dry eye syndrome; MGD meibomian gland dysfunction; ATs artificial tears; PFAT's preservative free artificial tears; [place] nuclear sclerotic cataract; PSC posterior subcapsular cataract; ERM epi-retinal membrane; PVD posterior vitreous detachment; RD retinal detachment; DM diabetes mellitus; DR diabetic retinopathy; NPDR non-proliferative diabetic retinopathy; PDR proliferative diabetic retinopathy; CSME clinically significant macular edema; DME diabetic macular edema; dbh dot blot hemorrhages; CWS cotton wool spot; POAG primary open angle glaucoma; C/D cup-to-disc ratio; HVF humphrey visual field; GVF goldmann visual field; OCT optical coherence tomography; IOP intraocular pressure; BRVO Branch retinal vein occlusion; CRVO central retinal vein occlusion; CRAO central retinal artery occlusion; BRAO branch retinal artery occlusion; RT retinal tear; SB scleral buckle; PPV pars plana vitrectomy; VH Vitreous hemorrhage; PRP panretinal laser photocoagulation; IVK intravitreal kenalog; VMT vitreomacular traction; MH Macular hole;  NVD neovascularization of the disc; NVE neovascularization elsewhere; AREDS age related eye disease study; ARMD age related macular degeneration; POAG primary open angle glaucoma; EBMD epithelial/anterior basement membrane dystrophy; ACIOL anterior chamber intraocular lens; IOL intraocular lens; PCIOL posterior chamber intraocular lens; Phaco/IOL phacoemulsification with intraocular lens placement; [place] photorefractive keratectomy; LASIK laser assisted in situ keratomileusis; HTN hypertension; DM diabetes mellitus; COPD chronic obstructive pulmonary disease

## 2020-11-12 NOTE — Progress Notes (Signed)
Mr.     [address]       [place] [address]             [phone]    HPI: Steven Williams returns for follow-up of his thoracic aortic aneurysms.  Steven Williams is an 87 year old man with a history of thoracic aortic atherosclerosis, aortic dissection, descending thoracic aneurysm, hypertension, hyperlipidemia, heart murmur, bradycardia, pacemaker, permanent atrial fibrillation, DVT, patient is disease, rheumatoid arthritis, TIA, COPD, and chronic kidney disease.  He had an aortic dissection in 2011 when he was living in [place].  He was followed for that.  He moved to [place] in 2019, and I have assumed follow-up.  I last saw him in September 2021.  His aneurysms were stable.  He says that about a month ago he was having some shortness of breath.  His cardiology office told him to double up on his Lasix for a couple of days and that did help.  He is not having any chest pain, pressure, tightness.  The rib pain that he was having the last time he was here is resolved.  He is concerned about his kidney function.  Past Medical History:  Diagnosis Date  . Anticoagulated 06/26/2020  . Antiphospholipid antibody positive   . Aortic aneurysm [facility])    thoracic, Dr Roxan Hockey  . Aortic dissection ([place]   . BPH (benign prostatic hyperplasia)   . Central retinal artery occlusion   . Chronic anemia   . Chronic renal insufficiency   . COPD (chronic obstructive pulmonary disease) ([place])   . DDD (degenerative disc disease), cervical   . DJD (degenerative joint disease)   . DVT (deep venous thrombosis) ([place])   . Gastric dysmotility   . GERD (gastroesophageal reflux disease)   . Gout   . HTN (hypertension)   . Hyperlipidemia   . Hypertensive retinopathy    OU  . Hypothyroid   . Macular degeneration   . Myelodysplasia (myelodysplastic syndrome) ([place])   . Nasal septal deviation 06/26/2020  . Paget's disease of bone   . Peripheral neuropathy   . Permanent  atrial fibrillation ([place])   . Recurrent epistaxis 06/26/2020  . Rheumatoid arthritis ([place])   . Symptomatic bradycardia   . TIA (transient ischemic attack)      Current Outpatient Medications  Medication Sig Dispense Refill  . Aflibercept (EYLEA) 2 MG/0.05ML SOLN 0.05 ml    . carvedilol (COREG) 6.25 MG tablet 1 tablet with food    . Cholecalciferol (VITAMIN D3) 50 MCG (2000 UT) TABS Take 1 tablet by mouth 2 (two) times daily.    Marland Kitchen dicyclomine (BENTYL) 10 MG capsule 2 capsules    . digoxin (LANOXIN) 0.125 MG tablet Take 0.125 mg by mouth as directed. Patient takes medication on M/W/F    . doxycycline (MONODOX) 100 MG capsule Take 100 mg by mouth 2 (two) times daily.    . Ferrous Sulfate (IRON) 325 (65 Fe) MG TABS Take 1 tablet by mouth 2 (two) times daily.    . furosemide (LASIX) 40 MG tablet 1 tablet    . levothyroxine (SYNTHROID) 100 MCG tablet Take 100 mcg by mouth every morning.    Marland Kitchen levothyroxine (SYNTHROID, LEVOTHROID) 88 MCG tablet Take 88 mcg by mouth daily before breakfast.    . Misc Natural Products (GLUCOSAMINE CHOND COMPLEX/MSM PO) Take 1 tablet by mouth daily.    . Multiple Vitamins-Minerals (PRESERVISION AREDS PO) Take 1 tablet by mouth 2 (two) times daily.    Marland Kitchen  mupirocin ointment (BACTROBAN) 2 % 3 (three) times daily.    . NON FORMULARY Antifungal nail cream from [place] - faxed 03/27/2019  [facility]    . oxymetazoline (AFRIN 12 HOUR) 0.05 % nasal spray 2 sprays in each nostril as needed    . pantoprazole (PROTONIX) 40 MG tablet Take by mouth.    . predniSONE (DELTASONE) 50 MG tablet Take 50 mg tablet 13 hours prior to CT scan, Take 50 mg tablet 7 hours prior to CT scan Take 50 mg tablet 1 hour prior to CT scan. 3 tablet 0  . predniSONE (DELTASONE) 50 MG tablet Take 50mg  prednisone on 3/1 @ 10:20pm.  Take 50mg  prednisone on 3/2 @ 4:20am.  Take 50mg  prednisone AND 50mg  benadryl on 3/2 @ 10:20am. 3 tablet 0  . pregabalin (LYRICA) 75 MG capsule Take 75 mg by mouth 2  (two) times daily.    . QC NO DRIP NASAL RELIEF 0.05 % nasal spray     . Ranibizumab (LUCENTIS) 0.3 MG/0.05ML SOLN inject into eye once monthly    . rOPINIRole (REQUIP) 2 MG tablet Take 2 mg by mouth at bedtime.    . rosuvastatin (CRESTOR) 10 MG tablet Take 10 mg by mouth daily.    . saw palmetto 500 MG capsule Take 500 mg by mouth 2 (two) times daily.    . silver nitrate applicators 07-37 % applicator Apply topically.    . tamsulosin (FLOMAX) 0.4 MG CAPS capsule Take 0.4 mg by mouth daily.    Marland Kitchen triamcinolone (NASACORT) 55 MCG/ACT AERO nasal inhaler 1 spray each nostril daily    . warfarin (COUMADIN) 2 MG tablet Take 2 mg by mouth daily.     Current Facility-Administered Medications  Medication Dose Route Frequency Provider Last Rate Last Admin  . Bevacizumab (AVASTIN) SOLN 1.25 mg  1.25 mg Intravitreal  [name], MD   1.25 mg at 11/01/18 2343  . diphenhydrAMINE (BENADRYL) capsule 50 mg  50 mg Oral Once Logan Bores, MD        Physical Exam BP 124/65 (BP Location: Left Arm, Patient Position: Sitting)   Pulse (!) 56   Resp 20   Ht 6' (1.829 m)   SpO2 95% Comment: RA  BMI 26.67 kg/m  87 year old man in no acute distress Alert and oriented x3 Walks with a cane No carotid bruits Cardiac regular rate and rhythm with a 2/6 systolic murmur Lungs clear bilaterally Trace edema with venous stasis changes both lower extremities  Diagnostic Tests: CT CHEST WITHOUT CONTRAST  TECHNIQUE: Multidetector CT imaging of the chest was performed following the standard protocol without IV contrast.  COMPARISON:  05/08/2020 and 04/06/2019.  FINDINGS: Cardiovascular: Atherosclerotic calcification of the aorta, aortic valve and coronary arteries. Ascending aorta measures 4.4 cm, stable when remeasured. A saccular aneurysm is again seen in the proximal descending thoracic aorta, due to lack of IV contrast. However, the lumen collectively measures 5.0 cm (2/74), stable when remeasured  on the prior study in a similar fashion. Descending thoracic aorta measures up to 4.2 cm distally. Heart is enlarged. No pericardial effusion.  Mediastinum/Nodes: Mediastinal lymph nodes are not enlarged by CT size criteria. Hilar regions are difficult to evaluate without IV contrast. Axillary lymph nodes are subcentimeter in size. Esophagus is grossly unremarkable.  Lungs/Pleura: Centrilobular and paraseptal emphysema. Calcified granulomas. 3 mm right middle lobe nodule (8/94), unchanged from 04/06/2019 and benign. Pleural thickening and rounded atelectasis in the posteromedial right lower lobe, as before. Scarring in the lingula. Lungs are  otherwise clear. No pleural fluid airway is unremarkable.  Upper Abdomen: Low-attenuation lesions in the liver measure up to 1.9 cm, unchanged and likely cysts. Visualized portions of the liver, gallbladder, adrenal glands and right kidney are otherwise unremarkable. Low-attenuation lesion in the medial left kidney measures 11 mm, incompletely visualized. Visualized portions of the spleen, pancreas, stomach and bowel are unremarkable.  Musculoskeletal: Degenerative changes in the spine. No worrisome lytic or sclerotic lesions.  IMPRESSION: 1. Saccular descending thoracic aortic aneurysm, stable. Fusiform aneurysms of the ascending and descending thoracic aorta, also stable. Recommend annual imaging followup by CTA or MRA or as clinically indicated. This recommendation follows 2010 ACCF/AHA/AATS/ACR/ASA/SCA/SCAI/SIR/STS/SVM Guidelines for the Diagnosis and Management of Patients with Thoracic Aortic Disease. Circulation. 2010; 121: A165-V374. Aortic aneurysm NOS (ICD10-I71.9). 2. Aortic atherosclerosis (ICD10-I70.0). Coronary artery calcification. 3.  Emphysema (ICD10-J43.9).   Electronically Signed   By: Lorin Picket M.D.   On: 11/06/2020 11:30  Impression: Steven Williams is an 87 year old man with a history of thoracic  aortic atherosclerosis, aortic dissection, descending thoracic aneurysm, hypertension, hyperlipidemia, heart murmur, bradycardia, pacemaker, permanent atrial fibrillation, DVT, patient is disease, rheumatoid arthritis, TIA, COPD, and chronic kidney disease.  Thoracic aortic atherosclerosis, aortic dissection, thoracic aneurysms-fusiform aneurysms of ascending and descending thoracic aorta, saccular aneurysm at site of limited prior dissection.  All unchanged on current CT.  We will plan to continue with semiannual follow-up.  Hypertension-blood pressure well controlled on current regimen.  Chronic kidney disease-his creatinine was 1.92 so CT was done without contrast.  Looking back he was 1.91 on 09/09/2020.  He was 1.76 a couple weeks later.  Overall I think this is stable.  He will follow up with Dr. Brigitte Pulse about that issue.   Plan: Return in 6 months for CT chest without contrast for follow-up of thoracic aortic aneurysms.  I spent over 20 minutes in review of records, images, and consultation with Steven Williams today. Steven Nakayama, MD [facility] [phone]

## 2020-11-13 ENCOUNTER — Encounter (INDEPENDENT_AMBULATORY_CARE_PROVIDER_SITE_OTHER): Payer: Self-pay | Admitting: Ophthalmology

## 2020-11-13 ENCOUNTER — Ambulatory Visit (INDEPENDENT_AMBULATORY_CARE_PROVIDER_SITE_OTHER): Payer: Medicare Other | Admitting: Ophthalmology

## 2020-11-13 DIAGNOSIS — H353213 Exudative age-related macular degeneration, right eye, with inactive scar: Secondary | ICD-10-CM | POA: Diagnosis not present

## 2020-11-13 DIAGNOSIS — H3581 Retinal edema: Secondary | ICD-10-CM

## 2020-11-13 DIAGNOSIS — H35033 Hypertensive retinopathy, bilateral: Secondary | ICD-10-CM

## 2020-11-13 DIAGNOSIS — I10 Essential (primary) hypertension: Secondary | ICD-10-CM | POA: Diagnosis not present

## 2020-11-13 DIAGNOSIS — H353221 Exudative age-related macular degeneration, left eye, with active choroidal neovascularization: Secondary | ICD-10-CM | POA: Diagnosis not present

## 2020-11-13 DIAGNOSIS — Z961 Presence of intraocular lens: Secondary | ICD-10-CM

## 2020-11-13 MED ORDER — AFLIBERCEPT 2MG/0.05ML IZ SOLN FOR KALEIDOSCOPE
2.0000 mg | INTRAVITREAL | Status: AC | PRN
  Administered 2020-11-13: 2 mg via INTRAVITREAL

## 2020-12-06 ENCOUNTER — Other Ambulatory Visit: Payer: Self-pay

## 2020-12-06 ENCOUNTER — Encounter (HOSPITAL_COMMUNITY): Payer: Self-pay

## 2020-12-06 ENCOUNTER — Emergency Department (HOSPITAL_COMMUNITY)
Admission: EM | Admit: 2020-12-06 | Discharge: 2020-12-06 | Disposition: A | Discharge status: Home / Self Care | Payer: Medicare Other | Attending: Emergency Medicine | Admitting: Emergency Medicine

## 2020-12-06 DIAGNOSIS — I480 Paroxysmal atrial fibrillation: Secondary | ICD-10-CM | POA: Insufficient documentation

## 2020-12-06 DIAGNOSIS — I129 Hypertensive chronic kidney disease with stage 1 through stage 4 chronic kidney disease, or unspecified chronic kidney disease: Secondary | ICD-10-CM | POA: Diagnosis not present

## 2020-12-06 DIAGNOSIS — Z87891 Personal history of nicotine dependence: Secondary | ICD-10-CM | POA: Diagnosis not present

## 2020-12-06 DIAGNOSIS — N1831 Chronic kidney disease, stage 3a: Secondary | ICD-10-CM | POA: Insufficient documentation

## 2020-12-06 DIAGNOSIS — Z79899 Other long term (current) drug therapy: Secondary | ICD-10-CM | POA: Insufficient documentation

## 2020-12-06 DIAGNOSIS — J449 Chronic obstructive pulmonary disease, unspecified: Secondary | ICD-10-CM | POA: Insufficient documentation

## 2020-12-06 DIAGNOSIS — Z7901 Long term (current) use of anticoagulants: Secondary | ICD-10-CM | POA: Insufficient documentation

## 2020-12-06 DIAGNOSIS — E039 Hypothyroidism, unspecified: Secondary | ICD-10-CM | POA: Diagnosis not present

## 2020-12-06 DIAGNOSIS — Z85828 Personal history of other malignant neoplasm of skin: Secondary | ICD-10-CM | POA: Insufficient documentation

## 2020-12-06 DIAGNOSIS — R040 Epistaxis: Secondary | ICD-10-CM | POA: Insufficient documentation

## 2020-12-06 LAB — PROTIME-INR
INR: 1.9 — ABNORMAL HIGH (ref 0.8–1.2)
Prothrombin Time: 21.5 seconds — ABNORMAL HIGH (ref 11.4–15.2)

## 2020-12-06 LAB — CBG MONITORING, ED: Glucose-Capillary: 101 mg/dL — ABNORMAL HIGH (ref 70–99)

## 2020-12-06 MED ORDER — OXYMETAZOLINE HCL 0.05 % NA SOLN
1.0000 | Freq: Once | NASAL | Status: DC
  Filled 2020-12-06: qty 30

## 2020-12-06 NOTE — ED Provider Notes (Signed)
[facility] EMERGENCY DEPARTMENT Provider Note   CSN: [id number] Arrival date & time: 12/06/20  1327     History Chief Complaint  Patient presents with  . Epistaxis    Nose bleed off and on for past two hours, brought in via EMS, NAD    Steven Williams is a 87 y.o. male.  The history is provided by the patient and medical records. No language interpreter was used.  Epistaxis Associated symptoms: no fever and no headaches      87 year old male significant history of DVT currently on warfarin, nasal septal deviation, atrial fibrillation brought here via EMS from home with complaints of nosebleed.  Patient report 3 days ago he had a transient nosebleed on his left nares that was shortly resolved after he applied pressure.  4 hours ago, he developed bleeding from his left nares again.  It was mild, transient, but does drain to the back of his throat.  He did contact the nurse at his facility who was unable to stop the bleeding so EMS came to bring patient here.  Patient denies any significant pain no recent injury.  States that his INR has been therapeutic.  He denies any headache and any trouble breathing.  Packing was placed prior to my evaluation  Past Medical History:  Diagnosis Date  . Anticoagulated 06/26/2020  . Antiphospholipid antibody positive   . Aortic aneurysm [facility])    thoracic, Dr Roxan Hockey  . Aortic dissection ([place])   . BPH (benign prostatic hyperplasia)   . Central retinal artery occlusion   . Chronic anemia   . Chronic renal insufficiency   . COPD (chronic obstructive pulmonary disease) ([place])   . DDD (degenerative disc disease), cervical   . DJD (degenerative joint disease)   . DVT (deep venous thrombosis) ([place])   . Gastric dysmotility   . GERD (gastroesophageal reflux disease)   . Gout   . HTN (hypertension)   . Hyperlipidemia   . Hypertensive retinopathy    OU  . Hypothyroid   . Macular degeneration   . Myelodysplasia (myelodysplastic  syndrome) ([place])   . Nasal septal deviation 06/26/2020  . Paget's disease of bone   . Peripheral neuropathy   . Permanent atrial fibrillation ([place])   . Recurrent epistaxis 06/26/2020  . Rheumatoid arthritis ([place])   . Symptomatic bradycardia   . TIA (transient ischemic attack)     Patient Active Problem List   Diagnosis Date Noted  . Descending thoracic aortic aneurysm ([place]) 10/23/2020  . Hypercoagulable state ([place]) 10/23/2020  . Preventative health care 10/23/2020  . Anticoagulated 06/26/2020  . Nasal septal deviation 06/26/2020  . Recurrent epistaxis 06/26/2020  . Headache 01/19/2020  . Localized edema 12/29/2019  . Long term (current) use of anticoagulants 12/25/2019  . Dysphagia 07/06/2019  . Restless legs syndrome 05/19/2019  . Thrombocytopenia ([place]) 11/08/2018  . Basal cell carcinoma of scalp 08/26/2018  . Atherosclerotic heart disease of native coronary artery without angina pectoris 08/10/2018  . Chronic kidney disease, stage 3a ([place]) 07/28/2018  . Anemia 07/27/2018  . Benign prostatic hyperplasia without lower urinary tract symptoms 07/27/2018  . Carotid artery occlusion 07/27/2018  . Essential hypertension 07/27/2018  . Gout 07/27/2018  . Hyperlipidemia 07/27/2018  . Hypothyroidism 07/27/2018  . Macular degeneration 07/27/2018  . Paroxysmal atrial fibrillation ([place]) 07/27/2018  . Polyneuropathy 07/27/2018  . Rheumatoid arthritis ([place]) 07/27/2018    Past Surgical History:  Procedure Laterality Date  . ATRIAL FIBRILLATION ABLATION  09/23/2014   Dr Encarnacion Chu  in [place]  . BACK SURGERY     lumbar, x 2  . CAROTID ENDARTERECTOMY Right 1990  . CAROTID STENT    . CATARACT EXTRACTION Bilateral 2010   [place]  . EYE SURGERY Bilateral 2010   [facility]  . LUNG DECORTICATION     VATS  . PACEMAKER GENERATOR CHANGE  12/03/2011   MDT [place] [id number] generator change by Dr Encarnacion Chu for symptomatic bradycardia  . PACEMAKER IMPLANT  2004   MDT        Family History  Problem  Relation Age of Onset  . Cirrhosis Mother   . Stroke Father   . Lung cancer Sister   . Lung cancer Brother     Social History   Tobacco Use  . Smoking status: Former Smoker    Packs/day: 2.00    Years: 12.00    Pack years: 24.00    Types: Cigarettes    Quit date: 09/07/1962    Years since quitting: 58.2  . Smokeless tobacco: Never Used  Vaping Use  . Vaping Use: Never used  Substance Use Topics  . Alcohol use: Yes    Alcohol/week: 2.0 standard drinks    Types: 2 Glasses of wine per week    Comment: daily  . Drug use: Never    Home Medications Prior to Admission medications   Medication Sig Start Date End Date Taking? Authorizing Provider  Aflibercept (EYLEA) 2 MG/0.05ML SOLN 0.05 ml 04/23/20   [provider]  carvedilol (COREG) 6.25 MG tablet 1 tablet with food    [provider]  Cholecalciferol (VITAMIN D3) 50 MCG (2000 UT) TABS Take 1 tablet by mouth 2 (two) times daily.    [provider]  dicyclomine (BENTYL) 10 MG capsule 2 capsules    [provider]  digoxin (LANOXIN) 0.125 MG tablet Take 0.125 mg by mouth as directed. Patient takes medication on M/W/F    [provider]  Digoxin 62.5 MCG TABS 1 tablet    [provider]  doxycycline (MONODOX) 100 MG capsule Take 100 mg by mouth 2 (two) times daily. 06/10/20   [provider]  Ferrous Sulfate (IRON) 325 (65 Fe) MG TABS Take 1 tablet by mouth 2 (two) times daily.    [provider]  furosemide (LASIX) 40 MG tablet 1 tablet    [provider]  levothyroxine (SYNTHROID) 100 MCG tablet Take 100 mcg by mouth every morning. 06/04/20   [provider]  levothyroxine (SYNTHROID, LEVOTHROID) 88 MCG tablet Take 88 mcg by mouth daily before breakfast.    [provider]  Misc Natural Products (GLUCOSAMINE CHOND COMPLEX/MSM PO) Take 1 tablet by mouth daily.    [provider]  Multiple Vitamins-Minerals (PRESERVISION AREDS  PO) Take 1 tablet by mouth 2 (two) times daily.    [provider]  mupirocin ointment (BACTROBAN) 2 % 3 (three) times daily. 06/26/20   [provider]  NON FORMULARY Antifungal nail cream from [place] - faxed 03/27/2019  [facility]    [provider]  oxymetazoline (AFRIN 12 HOUR) 0.05 % nasal spray 2 sprays in each nostril as needed 09/16/20   [provider]  pantoprazole (PROTONIX) 40 MG tablet Take by mouth. 06/04/20   [provider]  predniSONE (DELTASONE) 50 MG tablet Take 50 mg tablet 13 hours prior to CT scan, Take 50 mg tablet 7 hours prior to CT scan Take 50 mg tablet 1 hour prior to CT scan. 04/25/20   Modesto Charon  C, MD  predniSONE (DELTASONE) 50 MG tablet Take 50mg  prednisone on 3/1 @ 10:20pm.  Take 50mg  prednisone on 3/2 @ 4:20am.  Take 50mg  prednisone AND 50mg  benadryl on 3/2 @ 10:20am. 10/29/20   Logan Bores, MD  pregabalin (LYRICA) 75 MG capsule Take 75 mg by mouth 2 (two) times daily.    [provider]  QC NO DRIP NASAL RELIEF 0.05 % nasal spray  09/16/20   [provider]  Ranibizumab (LUCENTIS) 0.3 MG/0.05ML SOLN inject into eye once monthly 07/27/18   [provider]  rOPINIRole (REQUIP) 2 MG tablet Take 2 mg by mouth at bedtime. 11/26/19   [provider]  rosuvastatin (CRESTOR) 10 MG tablet Take 10 mg by mouth daily.    [provider]  saw palmetto 500 MG capsule Take 500 mg by mouth 2 (two) times daily.    [provider]  silver nitrate applicators 50-27 % applicator Apply topically. 06/11/20   [provider]  tamsulosin (FLOMAX) 0.4 MG CAPS capsule Take 0.4 mg by mouth daily.    [provider]  triamcinolone (NASACORT) 55 MCG/ACT AERO nasal inhaler 1 spray each nostril daily    [provider]  Vitamin D-Vitamin K (VITAMIN K2-VITAMIN D3) 45-2000 MCG-UNIT CAPS take 1 po bid 07/27/18   [provider]  warfarin (COUMADIN) 2 MG  tablet Take 2 mg by mouth daily.    [provider]    Allergies    Iodinated diagnostic agents  Review of Systems   Review of Systems  Constitutional: Negative for fever.  HENT: Positive for nosebleeds.   Neurological: Negative for headaches.    Physical Exam Updated Vital Signs BP (!) 143/65 (BP Location: Right Arm)   Pulse (!) 55   Temp 97.8 F (36.6 C) (Oral)   Resp 14   Ht 6\' 2"  (1.88 m)   Wt 83.9 kg   SpO2 97%   BMI 23.75 kg/m   Physical Exam Vitals and nursing note reviewed.  Constitutional:      General: He is not in acute distress.    Appearance: He is well-developed.  HENT:     Head: Atraumatic.     Nose:     Comments: Some dried blood noted in left nares without any active bleeding noted.  No signs of injury. Eyes:     Conjunctiva/sclera: Conjunctivae normal.  Musculoskeletal:     Cervical back: Neck supple.  Skin:    Findings: No rash.  Neurological:     Mental Status: He is alert. Mental status is at baseline.     ED Results / Procedures / Treatments   Labs (all labs ordered are listed, but only abnormal results are displayed) Labs Reviewed  PROTIME-INR - Abnormal; Notable for the following components:      Result Value   Prothrombin Time 21.5 (*)    INR 1.9 (*)    All other components within normal limits  CBG MONITORING, ED - Abnormal; Notable for the following components:   Glucose-Capillary 101 (*)    All other components within normal limits    EKG None  Radiology No results found.  Procedures Procedures   Medications Ordered in ED Medications  oxymetazoline (AFRIN) 0.05 % nasal spray 1 spray (has no administration in time range)    ED Course  I have reviewed the triage vital signs and the nursing notes.  Pertinent labs & imaging results that were available during my care of the patient were reviewed by me and  considered in my medical decision making (see chart for details).    MDM Rules/Calculators/A&P                           BP (!) 152/74 (BP Location: Right Arm)   Pulse 65 Comment: 65  Temp 97.9 F (36.6 C) (Oral)   Resp 16   Ht 6\' 2"  (1.88 m)   Wt 83.9 kg   SpO2 100%   BMI 23.75 kg/m   Final Clinical Impression(s) / ED Diagnoses Final diagnoses:  Left-sided epistaxis    Rx / DC Orders ED Discharge Orders    None     3:21 PM Patient is on anticoagulant who has had recurrent nosebleed through his left naris.  He does admits to increased sneezing around this time but denies any recent injury.  On exam no obvious active bleeding noted but there is some dried blood noted in the left nares.  Will spray Afrin, check INR, and will monitor.  If bleeding persists, will consider nasal packing.  5:06 PM Pt have been monitored for the past 3 hrs without active bleeding.  INR is 1.9.  At this time, plan to d/c home with Afrin spray, ENT referral and return precaution.  Does not think pt would benefit from nasal packing at this time.  Care discussed with DR. Belfi.     Domenic Moras, PA-C 12/06/20 1708    Malvin Johns, MD 12/06/20 2011

## 2020-12-06 NOTE — ED Triage Notes (Signed)
Received via EMS in no distress, left nare packed with gauze no observed bleeding currently, EMS states facility told them this has been happening off and on for the past few days, no pain, no complaints, uses a walker to ambulate

## 2020-12-06 NOTE — Discharge Instructions (Signed)
If your nosebleed starts again, please blow your nose, then spray 1-2 spray of afrin then apply steady pressure for 10-15 minutes.  If bleeding persists, do not hesitate to return to the ER for further care.

## 2020-12-30 ENCOUNTER — Ambulatory Visit (INDEPENDENT_AMBULATORY_CARE_PROVIDER_SITE_OTHER): Payer: Medicare Other

## 2020-12-30 DIAGNOSIS — I4821 Permanent atrial fibrillation: Secondary | ICD-10-CM | POA: Diagnosis not present

## 2021-01-01 LAB — CUP PACEART REMOTE DEVICE CHECK
Battery Impedance: 2671 Ohm
Battery Remaining Longevity: 26 mo
Battery Voltage: 2.74 V
Brady Statistic RV Percent Paced: 33 %
Date Time Interrogation Session: 20220425092842
Implantable Lead Implant Date: 20041008
Implantable Lead Implant Date: 20041008
Implantable Lead Location: 753859
Implantable Lead Location: 753860
Implantable Lead Model: 5076
Implantable Lead Model: 5076
Implantable Pulse Generator Implant Date: 20130328
Lead Channel Impedance Value: 67 Ohm
Lead Channel Impedance Value: 874 Ohm
Lead Channel Setting Pacing Amplitude: 2.5 V
Lead Channel Setting Pacing Pulse Width: 1 ms
Lead Channel Setting Sensing Sensitivity: 4 mV

## 2021-01-07 NOTE — Progress Notes (Shared)
[facility] Note  01/08/2021     CHIEF COMPLAINT Patient presents for No chief complaint on file.   HISTORY OF PRESENT ILLNESS: Steven Williams is a 87 y.o. male who presents to the clinic today for:   pt states   Referring physician:   HISTORICAL INFORMATION:   Selected notes from the MEDICAL RECORD NUMBER Referred by [name], [place]   CURRENT MEDICATIONS: Current Outpatient Medications (Ophthalmic Drugs)  Medication Sig  . Aflibercept (EYLEA) 2 MG/0.05ML SOLN 0.05 ml  . Ranibizumab (LUCENTIS) 0.3 MG/0.05ML SOLN inject into eye once monthly   No current facility-administered medications for this visit. (Ophthalmic Drugs)   Current Outpatient Medications (Other)  Medication Sig  . carvedilol (COREG) 6.25 MG tablet 1 tablet with food  . Cholecalciferol (VITAMIN D3) 50 MCG (2000 UT) TABS Take 1 tablet by mouth 2 (two) times daily.  Marland Kitchen dicyclomine (BENTYL) 10 MG capsule 2 capsules  . digoxin (LANOXIN) 0.125 MG tablet Take 0.125 mg by mouth as directed. Patient takes medication on M/W/F  . Digoxin 62.5 MCG TABS 1 tablet  . doxycycline (MONODOX) 100 MG capsule Take 100 mg by mouth 2 (two) times daily.  . Ferrous Sulfate (IRON) 325 (65 Fe) MG TABS Take 1 tablet by mouth 2 (two) times daily.  . furosemide (LASIX) 40 MG tablet 1 tablet  . levothyroxine (SYNTHROID) 100 MCG tablet Take 100 mcg by mouth every morning.  Marland Kitchen levothyroxine (SYNTHROID, LEVOTHROID) 88 MCG tablet Take 88 mcg by mouth daily before breakfast.  . Misc Natural Products (GLUCOSAMINE CHOND COMPLEX/MSM PO) Take 1 tablet by mouth daily.  . Multiple Vitamins-Minerals (PRESERVISION AREDS PO) Take 1 tablet by mouth 2 (two) times daily.  . mupirocin ointment (BACTROBAN) 2 % 3 (three) times daily.  . NON FORMULARY Antifungal nail cream from [facility] - faxed 03/27/2019  [facility]  . oxymetazoline (AFRIN 12 HOUR) 0.05 % nasal spray 2 sprays in each nostril as needed  .  pantoprazole (PROTONIX) 40 MG tablet Take by mouth.  . predniSONE (DELTASONE) 50 MG tablet Take 50 mg tablet 13 hours prior to CT scan, Take 50 mg tablet 7 hours prior to CT scan Take 50 mg tablet 1 hour prior to CT scan.  . predniSONE (DELTASONE) 50 MG tablet Take 50mg  prednisone on 3/1 @ 10:20pm.  Take 50mg  prednisone on 3/2 @ 4:20am.  Take 50mg  prednisone AND 50mg  benadryl on 3/2 @ 10:20am.  . pregabalin (LYRICA) 75 MG capsule Take 75 mg by mouth 2 (two) times daily.  . QC NO DRIP NASAL RELIEF 0.05 % nasal spray   . rOPINIRole (REQUIP) 2 MG tablet Take 2 mg by mouth at bedtime.  . rosuvastatin (CRESTOR) 10 MG tablet Take 10 mg by mouth daily.  . saw palmetto 500 MG capsule Take 500 mg by mouth 2 (two) times daily.  . silver nitrate applicators 84-69 % applicator Apply topically.  . tamsulosin (FLOMAX) 0.4 MG CAPS capsule Take 0.4 mg by mouth daily.  Marland Kitchen triamcinolone (NASACORT) 55 MCG/ACT AERO nasal inhaler 1 spray each nostril daily  . Vitamin D-Vitamin K (VITAMIN K2-VITAMIN D3) 45-2000 MCG-UNIT CAPS take 1 po bid  . warfarin (COUMADIN) 2 MG tablet Take 2 mg by mouth daily.   Current Facility-Administered Medications (Other)  Medication Route  . Bevacizumab (AVASTIN) SOLN 1.25 mg Intravitreal  . diphenhydrAMINE (BENADRYL) capsule 50 mg Oral      REVIEW OF SYSTEMS:    ALLERGIES Allergies  Allergen Reactions  .  Iodinated Diagnostic Agents Rash    PAST MEDICAL HISTORY Past Medical History:  Diagnosis Date  . Anticoagulated 06/26/2020  . Antiphospholipid antibody positive   . Aortic aneurysm [facility])    thoracic, Dr Roxan Hockey  . Aortic dissection ([place])   . BPH (benign prostatic hyperplasia)   . Central retinal artery occlusion   . Chronic anemia   . Chronic renal insufficiency   . COPD (chronic obstructive pulmonary disease) ([place])   . DDD (degenerative disc disease), cervical   . DJD (degenerative joint disease)   . DVT (deep venous thrombosis) ([place])   . Gastric  dysmotility   . GERD (gastroesophageal reflux disease)   . Gout   . HTN (hypertension)   . Hyperlipidemia   . Hypertensive retinopathy    OU  . Hypothyroid   . Macular degeneration   . Myelodysplasia (myelodysplastic syndrome) ([place])   . Nasal septal deviation 06/26/2020  . Paget's disease of bone   . Peripheral neuropathy   . Permanent atrial fibrillation ([place])   . Recurrent epistaxis 06/26/2020  . Rheumatoid arthritis ([place])   . Symptomatic bradycardia   . TIA (transient ischemic attack)    Past Surgical History:  Procedure Laterality Date  . ATRIAL FIBRILLATION ABLATION  09/23/2014   Dr Encarnacion Chu in [place]  . BACK SURGERY     lumbar, x 2  . CAROTID ENDARTERECTOMY Right 1990  . CAROTID STENT    . CATARACT EXTRACTION Bilateral 2010   [place]  . EYE SURGERY Bilateral 2010   [facility]  . LUNG DECORTICATION     V[facility]  . PACEMAKER GENERATOR CHANGE  12/03/2011   MDT [facility] ADDR01 generator change by Dr Encarnacion Chu for symptomatic bradycardia  . PACEMAKER IMPLANT  2004   MDT     FAMILY HISTORY Family History  Problem Relation Age of Onset  . Cirrhosis Mother   . Stroke Father   . Lung cancer Sister   . Lung cancer Brother     SOCIAL HISTORY Social History   Tobacco Use  . Smoking status: Former Smoker    Packs/day: 2.00    Years: 12.00    Pack years: 24.00    Types: Cigarettes    Quit date: 09/07/1962    Years since quitting: 58.3  . Smokeless tobacco: Never Used  Vaping Use  . Vaping Use: Never used  Substance Use Topics  . Alcohol use: Yes    Alcohol/week: 2.0 standard drinks    Types: 2 Glasses of wine per week    Comment: daily  . Drug use: Never         OPHTHALMIC EXAM:  Not recorded     IMAGING AND PROCEDURES  Imaging and Procedures for @TODAY @           ASSESSMENT/PLAN:    ICD-10-CM   1. Exudative age-related macular degeneration of left eye with active choroidal neovascularization ([place])  H35.3221   2. Exudative age-related macular  degeneration of right eye with inactive scar ([place])  H35.3213   3. Retinal edema  H35.81   4. Essential hypertension  I10   5. Hypertensive retinopathy of both eyes  H35.033   6. Pseudophakia of both eyes  Z96.1     1-3. 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    OD- w/ inactive scar -- history of low vision, CF 3'  OS- w/ CNV   - history of multiple IVL OS in [place], [place] w/ Dr. Doristine Church -- last injection 06/2018, q4-6 wk interval  -  moved to [place] in November 2019 and has been followed by Dr. Baird Cancer who had recommended observation, thus pt sought second opinion  - given functional monocular status and history of active CNV, has been receiving maintenance injections, s/p IVA #1 OS (02.25.20), #2 (04.07.20), #3 (05.05.20), #4 (06.22.20), #5 (08.03.20), #6 (03.22.21)  - s/p IVE OS #1 (09.14.20), #2 (10.23.20), #3 (11.30.20), #4 (01.11.21), #5 (02.15.21), #6 (03.22.21), #7 (05.03.21), #8 (06.16.21), #9 (07.26.21), #10 (09.07.21), #11 (10.19.21), #12 (12.01.2021), #13 (01.19.22), #14 (03.09.22)  - OCT shows PEDs, CNV w/ stable improvement in cystic changes OS; OD with inactive scar  - BCVA stable at 20/50 OS   - recommend IVE #15 OS today, 05.04.22, maintenance w/ f/u in 8 wks (slow tx and ext)  - pt prefers aggressive maintenance given functional monocular stutus  - pt wishes to be treated with IVE OS  - RBA of procedure discussed, questions answered  - informed consent obtained   - Eylea informed consent form signed and scanned on 01.11.2021  - see procedure note  - [email or web address] benefits investigation started 9.14.20 -- approved for 2021  - f/u in 8 wks -- DFE, OCT, likely injection  4,5. Hypertensive retinopathy OU  - discussed importance of tight BP control  - monitor  6. Pseudophakia OU  - s/p CE/IOL OU  - monitor  Ophthalmic Meds Ordered this visit:  No orders of the defined types were placed in this encounter.      No follow-ups on file.  There are no  Patient Instructions on file for this visit.   Explained the diagnoses, plan, and follow up with the patient and they expressed understanding.  Patient expressed understanding of the importance of proper follow up care.   This document serves as a record of services personally performed by Gardiner Sleeper, MD, PhD. It was created on their behalf by Roselee Nova, COMT. The creation of this record is the provider's dictation and/or activities during the visit.  Electronically signed by: Roselee Nova, COMT 01/07/21 10:43 AM    Gardiner Sleeper, M.D., Ph.D. Diseases & Surgery of the Retina and Vitreous [facility]: M myopia (nearsighted); A astigmatism; H hyperopia (farsighted); P presbyopia; Mrx spectacle prescription;  CTL contact lenses; OD right eye; OS left eye; OU both eyes  XT exotropia; ET esotropia; PEK punctate epithelial keratitis; PEE punctate epithelial erosions; DES dry eye syndrome; MGD meibomian gland dysfunction; ATs artificial tears; PFAT's preservative free artificial tears; [place] nuclear sclerotic cataract; PSC posterior subcapsular cataract; ERM epi-retinal membrane; PVD posterior vitreous detachment; RD retinal detachment; DM diabetes mellitus; DR diabetic retinopathy; NPDR non-proliferative diabetic retinopathy; PDR proliferative diabetic retinopathy; CSME clinically significant macular edema; DME diabetic macular edema; dbh dot blot hemorrhages; CWS cotton wool spot; POAG primary open angle glaucoma; C/D cup-to-disc ratio; HVF humphrey visual field; GVF goldmann visual field; OCT optical coherence tomography; IOP intraocular pressure; BRVO Branch retinal vein occlusion; CRVO central retinal vein occlusion; CRAO central retinal artery occlusion; BRAO branch retinal artery occlusion; RT retinal tear; SB scleral buckle; PPV pars plana vitrectomy; VH Vitreous hemorrhage; PRP panretinal laser photocoagulation; IVK intravitreal kenalog; VMT  vitreomacular traction; MH Macular hole;  NVD neovascularization of the disc; NVE neovascularization elsewhere; AREDS age related eye disease study; ARMD age related macular degeneration; POAG primary open angle glaucoma; EBMD epithelial/anterior basement membrane dystrophy; ACIOL anterior chamber intraocular lens; IOL intraocular lens; PCIOL posterior chamber intraocular lens; Phaco/IOL phacoemulsification with intraocular lens placement; [place] photorefractive keratectomy;  LASIK laser assisted in situ keratomileusis; HTN hypertension; DM diabetes mellitus; COPD chronic obstructive pulmonary disease

## 2021-01-08 ENCOUNTER — Encounter (INDEPENDENT_AMBULATORY_CARE_PROVIDER_SITE_OTHER): Payer: Medicare Other | Admitting: Ophthalmology

## 2021-01-08 DIAGNOSIS — H3581 Retinal edema: Secondary | ICD-10-CM

## 2021-01-08 DIAGNOSIS — I10 Essential (primary) hypertension: Secondary | ICD-10-CM

## 2021-01-08 DIAGNOSIS — H353213 Exudative age-related macular degeneration, right eye, with inactive scar: Secondary | ICD-10-CM

## 2021-01-08 DIAGNOSIS — H35033 Hypertensive retinopathy, bilateral: Secondary | ICD-10-CM

## 2021-01-08 DIAGNOSIS — H353221 Exudative age-related macular degeneration, left eye, with active choroidal neovascularization: Secondary | ICD-10-CM

## 2021-01-08 DIAGNOSIS — Z961 Presence of intraocular lens: Secondary | ICD-10-CM

## 2021-01-13 NOTE — Progress Notes (Signed)
[facility] Note  01/16/2021     CHIEF COMPLAINT Patient presents for Retina Follow Up   HISTORY OF PRESENT ILLNESS: Steven Williams is a 87 y.o. male who presents to the clinic today for:   HPI    Retina Follow Up    Patient presents with  Wet AMD.  In both eyes.  Duration of 9 weeks.  Since onset it is stable.  I, the attending physician,  performed the HPI with the patient and updated documentation appropriately.          Comments    9 week follow up ARMD OU- After his last injection the next morning he had a headache along with blurred vision.  It did improve.  This was the first time this had happened to him with Dr. Coralyn Pear giving the injection.  Because of hip and leg problems he did not sleep well last night.       Last edited by [name], MD on 01/16/2021  4:32 PM. (History)    pt states   Referring physician:   HISTORICAL INFORMATION:   Selected notes from the MEDICAL RECORD NUMBER Referred by [name], [place]   CURRENT MEDICATIONS: Current Outpatient Medications (Ophthalmic Drugs)  Medication Sig  . Aflibercept (EYLEA) 2 MG/0.05ML SOLN 0.05 ml  . Ranibizumab (LUCENTIS) 0.3 MG/0.05ML SOLN inject into eye once monthly (Patient not taking: Reported on 01/16/2021)   No current facility-administered medications for this visit. (Ophthalmic Drugs)   Current Outpatient Medications (Other)  Medication Sig  . carvedilol (COREG) 6.25 MG tablet 1 tablet with food  . Cholecalciferol (VITAMIN D3) 50 MCG (2000 UT) TABS Take 1 tablet by mouth 2 (two) times daily.  Marland Kitchen dicyclomine (BENTYL) 10 MG capsule 2 capsules  . digoxin (LANOXIN) 0.125 MG tablet Take 0.125 mg by mouth as directed. Patient takes medication on M/W/F  . Digoxin 62.5 MCG TABS 1 tablet  . doxycycline (MONODOX) 100 MG capsule Take 100 mg by mouth 2 (two) times daily.  . Ferrous Sulfate (IRON) 325 (65 Fe) MG TABS Take 1 tablet by mouth 2 (two) times daily.  .  furosemide (LASIX) 40 MG tablet 1 tablet  . levothyroxine (SYNTHROID) 100 MCG tablet Take 100 mcg by mouth every morning.  Marland Kitchen levothyroxine (SYNTHROID, LEVOTHROID) 88 MCG tablet Take 88 mcg by mouth daily before breakfast.  . Misc Natural Products (GLUCOSAMINE CHOND COMPLEX/MSM PO) Take 1 tablet by mouth daily.  . Multiple Vitamins-Minerals (PRESERVISION AREDS PO) Take 1 tablet by mouth 2 (two) times daily.  . mupirocin ointment (BACTROBAN) 2 % 3 (three) times daily.  . NON FORMULARY Antifungal nail cream from [facility] - faxed 03/27/2019  [facility]  . oxymetazoline (AFRIN 12 HOUR) 0.05 % nasal spray 2 sprays in each nostril as needed  . pantoprazole (PROTONIX) 40 MG tablet Take by mouth.  . pregabalin (LYRICA) 75 MG capsule Take 75 mg by mouth 2 (two) times daily.  . QC NO DRIP NASAL RELIEF 0.05 % nasal spray   . rOPINIRole (REQUIP) 2 MG tablet Take 2 mg by mouth at bedtime.  . rosuvastatin (CRESTOR) 10 MG tablet Take 10 mg by mouth daily.  . saw palmetto 500 MG capsule Take 500 mg by mouth 2 (two) times daily.  . silver nitrate applicators 23-53 % applicator Apply topically.  . tamsulosin (FLOMAX) 0.4 MG CAPS capsule Take 0.4 mg by mouth daily.  Marland Kitchen triamcinolone (NASACORT) 55 MCG/ACT AERO nasal inhaler 1 spray each nostril daily  .  Vitamin D-Vitamin K (VITAMIN K2-VITAMIN D3) 45-2000 MCG-UNIT CAPS take 1 po bid  . warfarin (COUMADIN) 2 MG tablet Take 2 mg by mouth daily.  . predniSONE (DELTASONE) 50 MG tablet Take 50 mg tablet 13 hours prior to CT scan, Take 50 mg tablet 7 hours prior to CT scan Take 50 mg tablet 1 hour prior to CT scan. (Patient not taking: Reported on 01/16/2021)  . predniSONE (DELTASONE) 50 MG tablet Take 50mg  prednisone on 3/1 @ 10:20pm.  Take 50mg  prednisone on 3/2 @ 4:20am.  Take 50mg  prednisone AND 50mg  benadryl on 3/2 @ 10:20am. (Patient not taking: Reported on 01/16/2021)   Current Facility-Administered Medications (Other)  Medication Route  . Bevacizumab  (AVASTIN) SOLN 1.25 mg Intravitreal  . diphenhydrAMINE (BENADRYL) capsule 50 mg Oral      REVIEW OF SYSTEMS: ROS    Positive for: Genitourinary, Musculoskeletal, Endocrine, Cardiovascular, Eyes, Heme/Lymph   Negative for: Constitutional, Gastrointestinal, Neurological, Skin, HENT, Respiratory, Psychiatric, Allergic/Imm   Last edited by Leonie Douglas, COA on 01/16/2021  2:10 PM. (History)       ALLERGIES Allergies  Allergen Reactions  . Iodinated Diagnostic Agents Rash    PAST MEDICAL HISTORY Past Medical History:  Diagnosis Date  . Anticoagulated 06/26/2020  . Antiphospholipid antibody positive   . Aortic aneurysm [facility])    thoracic, Dr Roxan Hockey  . Aortic dissection ([place])   . BPH (benign prostatic hyperplasia)   . Central retinal artery occlusion   . Chronic anemia   . Chronic renal insufficiency   . COPD (chronic obstructive pulmonary disease) ([place])   . DDD (degenerative disc disease), cervical   . DJD (degenerative joint disease)   . DVT (deep venous thrombosis) ([place])   . Gastric dysmotility   . GERD (gastroesophageal reflux disease)   . Gout   . HTN (hypertension)   . Hyperlipidemia   . Hypertensive retinopathy    OU  . Hypothyroid   . Macular degeneration   . Myelodysplasia (myelodysplastic syndrome) ([place])   . Nasal septal deviation 06/26/2020  . Paget's disease of bone   . Peripheral neuropathy   . Permanent atrial fibrillation ([place])   . Recurrent epistaxis 06/26/2020  . Rheumatoid arthritis ([place])   . Symptomatic bradycardia   . TIA (transient ischemic attack)    Past Surgical History:  Procedure Laterality Date  . ATRIAL FIBRILLATION ABLATION  09/23/2014   Dr Encarnacion Chu in [place]  . BACK SURGERY     lumbar, x 2  . CAROTID ENDARTERECTOMY Right 1990  . CAROTID STENT    . CATARACT EXTRACTION Bilateral 2010   [place]  . EYE SURGERY Bilateral 2010   [facility]  . LUNG DECORTICATION     V[facility]  . PACEMAKER GENERATOR CHANGE  12/03/2011   MDT [place] [id number]  generator change by Dr Encarnacion Chu for symptomatic bradycardia  . PACEMAKER IMPLANT  2004   MDT     FAMILY HISTORY Family History  Problem Relation Age of Onset  . Cirrhosis Mother   . Stroke Father   . Lung cancer Sister   . Lung cancer Brother     SOCIAL HISTORY Social History   Tobacco Use  . Smoking status: Former Smoker    Packs/day: 2.00    Years: 12.00    Pack years: 24.00    Types: Cigarettes    Quit date: 09/07/1962    Years since quitting: 58.4  . Smokeless tobacco: Never Used  Vaping Use  . Vaping Use: Never used  Substance Use Topics  .  Alcohol use: Yes    Alcohol/week: 2.0 standard drinks    Types: 2 Glasses of wine per week    Comment: daily  . Drug use: Never         OPHTHALMIC EXAM:  Base Eye Exam    Visual Acuity (Snellen - Linear)      Right Left   Dist cc CF3' 20/70-   Dist ph cc  NI       Tonometry (Tonopen, 2:24 PM)      Right Left   Pressure 10 10       Pupils      Dark Light Shape React APD   Right 2 1 Round Sluggish None   Left 2 1 Round Sluggish None       Visual Fields (Counting fingers)      Left Right    Full Full       Extraocular Movement      Right Left    Full Full       Neuro/Psych    Oriented x3: Yes   Mood/Affect: Normal       Dilation    Both eyes: 1.0% Mydriacyl, 2.5% Phenylephrine @ 2:24 PM        Slit Lamp and Fundus Exam    Slit Lamp Exam      Right Left   Lids/Lashes Dermatochalasis - upper lid, Meibomian gland dysfunction Dermatochalasis - upper lid, Meibomian gland dysfunction   Conjunctiva/Sclera White and quiet White and quiet   Cornea Arcus, 1+Punctate epithelial erosions, Well healed cataract wounds Arcus, 1-2+inferior Punctate epithelial erosions centrally, Well healed cataract wounds, mild endo pigment   Anterior Chamber Deep and quiet Deep and quiet   Iris Round and moderately dilated Round and moderately dilated   Lens Posterior chamber intraocular lens, 1+ non-central Posterior  capsular opacification Posterior chamber intraocular lens, open PC   Vitreous Vitreous syneresis, Posterior vitreous detachment Vitreous syneresis       Fundus Exam      Right Left   Disc +cupping, trace pallor, thin superior rim, 360 PPA mild pallor, 360 PPA, +cupping   C/D Ratio 0.7 0.7   Macula Flat, diffuse RPE atrophy, central pigment clumping, No heme or edema Flat, Blunted foveal reflex, Drusen, RPE mottling, clumping and atrophy, focal IRH ST macula, No edema, trace cystic changes -- stably improved, peripapillary GA nasal macula   Vessels Vascular attenuation Vascular attenuation, Tortuous   Periphery Attached, mild reticular degeneration, mild paving stone inferiorly Attached, mild reticular degeneration        Refraction    Wearing Rx      Sphere Cylinder Axis Add   Right -1.75 +2.25 005 +2.50   Left -1.50 +2.50 175 +2.50   Type: PAL       Manifest Refraction      Sphere Cylinder Axis Dist VA   Right       Left -1.50 +2.50 175 20/60  Patient omitted multi letters on each line.. Was not consistent.          IMAGING AND PROCEDURES  Imaging and Procedures for @TODAY @  OCT, Retina - OU - Both Eyes       Right Eye Quality was good. Central Foveal Thickness: 239. Progression has been stable. Findings include abnormal foveal contour, no IRF, no SRF, subretinal hyper-reflective material, retinal drusen , outer retinal atrophy, pigment epithelial detachment.   Left Eye Quality was good. Central Foveal Thickness: 349. Progression has been stable. Findings include abnormal foveal contour, no  SRF, outer retinal atrophy, retinal drusen , pigment epithelial detachment, subretinal hyper-reflective material, no IRF (Stable improvement in cystic changes, persistent GA).   Notes *Images captured and stored on drive  Diagnosis / Impression:  Exudative ARMD OU OD: +atrophic macular scar - stable OS: Stable improvement in cystic changes, persistent GA  Clinical management:   See below  Abbreviations: NFP - Normal foveal profile. CME - cystoid macular edema. PED - pigment epithelial detachment. IRF - intraretinal fluid. SRF - subretinal fluid. EZ - ellipsoid zone. ERM - epiretinal membrane. ORA - outer retinal atrophy. ORT - outer retinal tubulation. SRHM - subretinal hyper-reflective material        Intravitreal Injection, Pharmacologic Agent - OS - Left Eye       Time Out 01/16/2021. 3:30 PM. Confirmed correct patient, procedure, site, and patient consented.   Anesthesia Topical anesthesia was used. Anesthetic medications included Lidocaine 2%, Proparacaine 0.5%.   Procedure Preparation included 5% betadine to ocular surface, eyelid speculum. A (32g) needle was used.   Injection:  2 mg aflibercept Alfonse Flavors) SOLN   NDC: A3590391, Lot: [id number], Expiration date: 09/06/2021   Route: Intravitreal, Site: Left Eye, Waste: 0.05 mL  Post-op Post injection exam found visual acuity of at least counting fingers. The patient tolerated the procedure well. There were no complications. The patient received written and verbal post procedure care education. Post injection medications were not given.                 ASSESSMENT/PLAN:    ICD-10-CM   1. Exudative age-related macular degeneration of left eye with active choroidal neovascularization (HCC)  H35.3221 Intravitreal Injection, Pharmacologic Agent - OS - Left Eye    aflibercept (EYLEA) SOLN 2 mg  2. Exudative age-related macular degeneration of right eye with inactive scar ([place])  H35.3213   3. Retinal edema  H35.81 OCT, Retina - OU - Both Eyes  4. Essential hypertension  I10   5. Hypertensive retinopathy of both eyes  H35.033   6. Pseudophakia of both eyes  Z96.1     1-3. 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    OD- w/ inactive scar -- history of low vision, CF 3'  OS- w/ CNV   - history of multiple IVL OS in [place], [place] w/ Dr. Doristine Church -- last injection 06/2018, q4-6  wk interval  - moved to [place] in November 2019 and has been followed by Dr. Baird Cancer who had recommended observation, thus pt sought second opinion  - given functional monocular status and history of active CNV, has been receiving maintenance injections, s/p IVA #1 OS (02.25.20), #2 (04.07.20), #3 (05.05.20), #4 (06.22.20), #5 (08.03.20), #6 (03.22.21)  - s/p IVE OS #1 (09.14.20), #2 (10.23.20), #3 (11.30.20), #4 (01.11.21), #5 (02.15.21), #6 (03.22.21), #7 (05.03.21), #8 (06.16.21), #9 (07.26.21), #10 (09.07.21), #11 (10.19.21), #12 (12.01.2021), #13 (01.19.22), #14 (03.09.22)  - OCT shows PEDs, CNV w/ stable improvement in cystic changes OS; OD with inactive scar  - BCVA decreased to 20/70 from 20/50 OS   - recommend IVE #15 OS today, 05.12.22, maintenance w/ f/u in 8 wks (slow tx and ext)  - pt prefers aggressive maintenance given functional monocular stutus  - pt wishes to be treated with IVE OS  - RBA of procedure discussed, questions answered  - informed consent obtained   - Eylea informed consent form signed and scanned on 01.11.2021  - see procedure note  - [email or web address] benefits investigation started 9.14.20 -- approved for 2021  - f/u  in 8 wks -- DFE, OCT, likely injection  4,5. Hypertensive retinopathy OU  - discussed importance of tight BP control  - monitor   6. Pseudophakia OU  - s/p CE/IOL OU  - monitor   Ophthalmic Meds Ordered this visit:  Meds ordered this encounter  Medications  . aflibercept (EYLEA) SOLN 2 mg       Return in about 8 weeks (around 03/13/2021) for f/u exu ARMD OU, DFE, OCT.  There are no Patient Instructions on file for this visit.   Explained the diagnoses, plan, and follow up with the patient and they expressed understanding.  Patient expressed understanding of the importance of proper follow up care.   This document serves as a record of services personally performed by Gardiner Sleeper, MD, PhD. It was created on their behalf by Leonie Douglas, an  ophthalmic technician. The creation of this record is the provider's dictation and/or activities during the visit.    Electronically signed by: Leonie Douglas COA, 01/16/21  4:34 PM   This document serves as a record of services personally performed by Gardiner Sleeper, MD, PhD. It was created on their behalf by San Jetty. Owens Shark, OA an ophthalmic technician. The creation of this record is the provider's dictation and/or activities during the visit.    Electronically signed by: San Jetty. Marguerita Merles 05.12.2022 4:34 PM  Gardiner Sleeper, M.D., Ph.D. Diseases & Surgery of the Retina and [place] 01/16/2021  I have reviewed the above documentation for accuracy and completeness, and I agree with the above. Gardiner Sleeper, M.D., Ph.D. 01/16/21 4:34 PM   Abbreviations: M myopia (nearsighted); A astigmatism; H hyperopia (farsighted); P presbyopia; Mrx spectacle prescription;  CTL contact lenses; OD right eye; OS left eye; OU both eyes  XT exotropia; ET esotropia; PEK punctate epithelial keratitis; PEE punctate epithelial erosions; DES dry eye syndrome; MGD meibomian gland dysfunction; ATs artificial tears; PFAT's preservative free artificial tears; [place] nuclear sclerotic cataract; PSC posterior subcapsular cataract; ERM epi-retinal membrane; PVD posterior vitreous detachment; RD retinal detachment; DM diabetes mellitus; DR diabetic retinopathy; NPDR non-proliferative diabetic retinopathy; PDR proliferative diabetic retinopathy; CSME clinically significant macular edema; DME diabetic macular edema; dbh dot blot hemorrhages; CWS cotton wool spot; POAG primary open angle glaucoma; C/D cup-to-disc ratio; HVF humphrey visual field; GVF goldmann visual field; OCT optical coherence tomography; IOP intraocular pressure; BRVO Branch retinal vein occlusion; CRVO central retinal vein occlusion; CRAO central retinal artery occlusion; BRAO branch retinal artery occlusion; RT retinal tear; SB  scleral buckle; PPV pars plana vitrectomy; VH Vitreous hemorrhage; PRP panretinal laser photocoagulation; IVK intravitreal kenalog; VMT vitreomacular traction; MH Macular hole;  NVD neovascularization of the disc; NVE neovascularization elsewhere; AREDS age related eye disease study; ARMD age related macular degeneration; POAG primary open angle glaucoma; EBMD epithelial/anterior basement membrane dystrophy; ACIOL anterior chamber intraocular lens; IOL intraocular lens; PCIOL posterior chamber intraocular lens; Phaco/IOL phacoemulsification with intraocular lens placement; [place] photorefractive keratectomy; LASIK laser assisted in situ keratomileusis; HTN hypertension; DM diabetes mellitus; COPD chronic obstructive pulmonary disease

## 2021-01-16 ENCOUNTER — Ambulatory Visit (INDEPENDENT_AMBULATORY_CARE_PROVIDER_SITE_OTHER): Payer: Medicare Other | Admitting: Ophthalmology

## 2021-01-16 ENCOUNTER — Other Ambulatory Visit: Payer: Self-pay

## 2021-01-16 ENCOUNTER — Encounter (INDEPENDENT_AMBULATORY_CARE_PROVIDER_SITE_OTHER): Payer: Self-pay | Admitting: Ophthalmology

## 2021-01-16 DIAGNOSIS — I10 Essential (primary) hypertension: Secondary | ICD-10-CM

## 2021-01-16 DIAGNOSIS — H353221 Exudative age-related macular degeneration, left eye, with active choroidal neovascularization: Secondary | ICD-10-CM | POA: Diagnosis not present

## 2021-01-16 DIAGNOSIS — H35033 Hypertensive retinopathy, bilateral: Secondary | ICD-10-CM

## 2021-01-16 DIAGNOSIS — H353213 Exudative age-related macular degeneration, right eye, with inactive scar: Secondary | ICD-10-CM | POA: Diagnosis not present

## 2021-01-16 DIAGNOSIS — Z961 Presence of intraocular lens: Secondary | ICD-10-CM

## 2021-01-16 DIAGNOSIS — H3581 Retinal edema: Secondary | ICD-10-CM | POA: Diagnosis not present

## 2021-01-16 MED ORDER — AFLIBERCEPT 2MG/0.05ML IZ SOLN FOR KALEIDOSCOPE
2.0000 mg | INTRAVITREAL | Status: AC | PRN
  Administered 2021-01-16: 2 mg via INTRAVITREAL

## 2021-01-16 NOTE — Progress Notes (Signed)
Remote pacemaker transmission.   

## 2021-01-24 ENCOUNTER — Other Ambulatory Visit: Payer: Self-pay | Admitting: Orthopedic Surgery

## 2021-01-24 DIAGNOSIS — M25451 Effusion, right hip: Secondary | ICD-10-CM

## 2021-01-31 ENCOUNTER — Ambulatory Visit
Admission: RE | Admit: 2021-01-31 | Discharge: 2021-01-31 | Disposition: A | Discharge status: Home / Self Care | Payer: Medicare Other | Source: Ambulatory Visit | Attending: Orthopedic Surgery | Admitting: Orthopedic Surgery

## 2021-01-31 ENCOUNTER — Other Ambulatory Visit: Payer: Self-pay

## 2021-01-31 DIAGNOSIS — M25451 Effusion, right hip: Secondary | ICD-10-CM

## 2021-02-05 ENCOUNTER — Ambulatory Visit: Payer: Medicare Other | Admitting: Podiatry

## 2021-02-17 DIAGNOSIS — M25551 Pain in right hip: Secondary | ICD-10-CM | POA: Insufficient documentation

## 2021-03-13 NOTE — Progress Notes (Signed)
[facility] Note  03/14/2021     CHIEF COMPLAINT Patient presents for Retina Follow Up   HISTORY OF PRESENT ILLNESS: Steven Williams is a 87 y.o. male who presents to the clinic today for:   HPI     Retina Follow Up   Patient presents with  Wet AMD.  In both eyes.  Duration of 8 weeks.  Since onset it is gradually worsening.  I, the attending physician,  performed the HPI with the patient and updated documentation appropriately.        Comments   Pt here for 8 wk ret f/u for exu ARMD OU. Pt states vision is about the same. No ocular pain or discomfort.       Last edited by [name], MD on 03/17/2021 11:21 PM.    pt states he feels like his vision is getting worse, but today, he felt like he did well on the eye chart  Referring physician:   HISTORICAL INFORMATION:   Selected notes from the [facility] Referred by [name], [place]   CURRENT MEDICATIONS: Current Outpatient Medications (Ophthalmic Drugs)  Medication Sig   Aflibercept (EYLEA) 2 MG/0.05ML SOLN 0.05 ml   Ranibizumab (LUCENTIS) 0.3 MG/0.05ML SOLN inject into eye once monthly (Patient not taking: Reported on 01/16/2021)   No current facility-administered medications for this visit. (Ophthalmic Drugs)   Current Outpatient Medications (Other)  Medication Sig   carvedilol (COREG) 6.25 MG tablet 1 tablet with food   Cholecalciferol (VITAMIN D3) 50 MCG (2000 UT) TABS Take 1 tablet by mouth 2 (two) times daily.   dicyclomine (BENTYL) 10 MG capsule 2 capsules   digoxin (LANOXIN) 0.125 MG tablet Take 0.125 mg by mouth as directed. Patient takes medication on M/W/F   Digoxin 62.5 MCG TABS 1 tablet   doxycycline (MONODOX) 100 MG capsule Take 100 mg by mouth 2 (two) times daily.   Ferrous Sulfate (IRON) 325 (65 Fe) MG TABS Take 1 tablet by mouth 2 (two) times daily.   furosemide (LASIX) 40 MG tablet 1 tablet   levothyroxine (SYNTHROID) 100 MCG tablet Take 100 mcg by  mouth every morning.   levothyroxine (SYNTHROID, LEVOTHROID) 88 MCG tablet Take 88 mcg by mouth daily before breakfast.   Misc Natural Products (GLUCOSAMINE CHOND COMPLEX/MSM PO) Take 1 tablet by mouth daily.   Multiple Vitamins-Minerals (PRESERVISION AREDS PO) Take 1 tablet by mouth 2 (two) times daily.   mupirocin ointment (BACTROBAN) 2 % 3 (three) times daily.   NON FORMULARY Antifungal nail cream from [facility] - faxed 03/27/2019  [facility]   oxymetazoline (AFRIN 12 HOUR) 0.05 % nasal spray 2 sprays in each nostril as needed   pantoprazole (PROTONIX) 40 MG tablet Take by mouth.   predniSONE (DELTASONE) 50 MG tablet Take 50 mg tablet 13 hours prior to CT scan, Take 50 mg tablet 7 hours prior to CT scan Take 50 mg tablet 1 hour prior to CT scan. (Patient not taking: Reported on 01/16/2021)   predniSONE (DELTASONE) 50 MG tablet Take 54m prednisone on 3/1 @ 10:20pm.  Take 594mprednisone on 3/2 @ 4:20am.  Take 5037mrednisone AND 68m105mnadryl on 3/2 @ 10:20am. (Patient not taking: Reported on 01/16/2021)   pregabalin (LYRICA) 75 MG capsule Take 75 mg by mouth 2 (two) times daily.   QC NO DRIP NASAL RELIEF 0.05 % nasal spray    rOPINIRole (REQUIP) 2 MG tablet Take 2 mg by mouth at bedtime.   rosuvastatin (  CRESTOR) 10 MG tablet Take 10 mg by mouth daily.   saw palmetto 500 MG capsule Take 500 mg by mouth 2 (two) times daily.   silver nitrate applicators 40-98 % applicator Apply topically.   tamsulosin (FLOMAX) 0.4 MG CAPS capsule Take 0.4 mg by mouth daily.   triamcinolone (NASACORT) 55 MCG/ACT AERO nasal inhaler 1 spray each nostril daily   Vitamin D-Vitamin K (VITAMIN K2-VITAMIN D3) 45-2000 MCG-UNIT CAPS take 1 po bid   warfarin (COUMADIN) 2 MG tablet Take 2 mg by mouth daily.   Current Facility-Administered Medications (Other)  Medication Route   Bevacizumab (AVASTIN) SOLN 1.25 mg Intravitreal   diphenhydrAMINE (BENADRYL) capsule 50 mg Oral      REVIEW OF SYSTEMS: ROS    Positive for: Genitourinary, Musculoskeletal, Endocrine, Cardiovascular, Eyes, Heme/Lymph Negative for: Constitutional, Gastrointestinal, Neurological, Skin, HENT, Respiratory, Psychiatric, Allergic/Imm Last edited by [name], COT on 03/14/2021  1:32 PM.        ALLERGIES Allergies  Allergen Reactions   Iodinated Diagnostic Agents Rash    PAST MEDICAL HISTORY Past Medical History:  Diagnosis Date   Anticoagulated 06/26/2020   Antiphospholipid antibody positive    Aortic aneurysm (HCC)    thoracic, Dr Roxan Hockey   Aortic dissection [facility])    BPH (benign prostatic hyperplasia)    Central retinal artery occlusion    Chronic anemia    Chronic renal insufficiency    COPD (chronic obstructive pulmonary disease) (HCC)    DDD (degenerative disc disease), cervical    DJD (degenerative joint disease)    DVT (deep venous thrombosis) (HCC)    Gastric dysmotility    GERD (gastroesophageal reflux disease)    Gout    HTN (hypertension)    Hyperlipidemia    Hypertensive retinopathy    OU   Hypothyroid    Macular degeneration    Myelodysplasia (myelodysplastic syndrome) (HCC)    Nasal septal deviation 06/26/2020   Paget's disease of bone    Peripheral neuropathy    Permanent atrial fibrillation (HCC)    Recurrent epistaxis 06/26/2020   Rheumatoid arthritis (HCC)    Symptomatic bradycardia    TIA (transient ischemic attack)    Past Surgical History:  Procedure Laterality Date   ATRIAL FIBRILLATION ABLATION  09/23/2014   Dr Encarnacion Chu in [place]     lumbar, x 2   CAROTID ENDARTERECTOMY Right 1990   CAROTID STENT     CATARACT EXTRACTION Bilateral 2010   [place]   EYE SURGERY Bilateral 2010   [facility]   LUNG DECORTICATION     VATS   PACEMAKER GENERATOR CHANGE  12/03/2011   [facility] Adapta ADDR01 generator change by Dr Encarnacion Chu for symptomatic bradycardia   PACEMAKER IMPLANT  2004   MDT     FAMILY HISTORY Family History  Problem Relation Age of Onset    Cirrhosis Mother    Stroke Father    Lung cancer Sister    Lung cancer Brother     SOCIAL HISTORY Social History   Tobacco Use   Smoking status: Former    Packs/day: 2.00    Years: 12.00    Pack years: 24.00    Types: Cigarettes    Quit date: 09/07/1962    Years since quitting: 58.5   Smokeless tobacco: Never  Vaping Use   Vaping Use: Never used  Substance Use Topics   Alcohol use: Yes    Alcohol/week: 2.0 standard drinks    Types: 2 Glasses of wine per week  Comment: daily   Drug use: Never         OPHTHALMIC EXAM:  Base Eye Exam     Visual Acuity (Snellen - Linear)       Right Left   Dist cc CF at 3' 20/50 -1   Dist ph cc NI NI         Tonometry (Tonopen, 1:40 PM)       Right Left   Pressure 13 12         Pupils       Dark Light Shape React APD   Right 2 1 Round Sluggish None   Left 2 1 Round Sluggish None         Visual Fields (Counting fingers)       Left Right    Full Full         Extraocular Movement       Right Left    Full, Ortho Full, Ortho         Neuro/Psych     Oriented x3: Yes   Mood/Affect: Normal         Dilation     Both eyes: 1.0% Mydriacyl, 2.5% Phenylephrine @ 1:40 PM           Slit Lamp and Fundus Exam     Slit Lamp Exam       Right Left   Lids/Lashes Dermatochalasis - upper lid, Meibomian gland dysfunction Dermatochalasis - upper lid, Meibomian gland dysfunction   Conjunctiva/Sclera White and quiet White and quiet   Cornea Arcus, 1+Punctate epithelial erosions, Well healed cataract wounds Arcus, 1-2+inferior Punctate epithelial erosions centrally, Well healed cataract wounds, mild endo pigment   Anterior Chamber Deep and quiet Deep and quiet   Iris Round and moderately dilated Round and moderately dilated   Lens Posterior chamber intraocular lens, 1+ non-central Posterior capsular opacification Posterior chamber intraocular lens, open PC   Vitreous Vitreous syneresis, Posterior vitreous  detachment Vitreous syneresis         Fundus Exam       Right Left   Disc +cupping, trace pallor, thin superior rim, 360 PPA mild pallor, 360 PPA, +cupping   C/D Ratio 0.7 0.7   Macula Flat, diffuse RPE atrophy, central pigment clumping, No heme or edema Flat, Blunted foveal reflex, Drusen, RPE mottling, clumping and atrophy, focal IRH ST macula, No edema, trace cystic changes -- stably improved, peripapillary GA nasal macula   Vessels Vascular attenuation Vascular attenuation, Tortuous   Periphery Attached, mild reticular degeneration, mild paving stone inferiorly Attached, mild reticular degeneration           Refraction     Wearing Rx       Sphere Cylinder Axis Add   Right -1.75 +2.25 005 +2.50   Left -1.50 +2.50 175 +2.50    Type: PAL            IMAGING AND PROCEDURES  Imaging and Procedures for '@TODAY' @  OCT, Retina - OU - Both Eyes       Right Eye Quality was good. Central Foveal Thickness: 206. Progression has been stable. Findings include abnormal foveal contour, no IRF, no SRF, subretinal hyper-reflective material, retinal drusen , outer retinal atrophy, pigment epithelial detachment.   Left Eye Quality was good. Central Foveal Thickness: 249. Progression has been stable. Findings include abnormal foveal contour, no SRF, outer retinal atrophy, retinal drusen , pigment epithelial detachment, subretinal hyper-reflective material, no IRF (Stable improvement in cystic changes, persistent GA).   Notes *  Images captured and stored on drive  Diagnosis / Impression:  Exudative ARMD OU OD: +atrophic macular scar - stable OS: Stable improvement in cystic changes, persistent GA  Clinical management:  See below  Abbreviations: NFP - Normal foveal profile. CME - cystoid macular edema. PED - pigment epithelial detachment. IRF - intraretinal fluid. SRF - subretinal fluid. EZ - ellipsoid zone. ERM - epiretinal membrane. ORA - outer retinal atrophy. ORT - outer retinal  tubulation. SRHM - subretinal hyper-reflective material      Intravitreal Injection, Pharmacologic Agent - OS - Left Eye       Time Out 03/14/2021. 2:10 PM. Confirmed correct patient, procedure, site, and patient consented.   Anesthesia Topical anesthesia was used. Anesthetic medications included Lidocaine 2%, Proparacaine 0.5%.   Procedure Preparation included 5% betadine to ocular surface, eyelid speculum. A (32g) needle was used.   Injection: 2 mg aflibercept 2 MG/0.05ML   Route: Intravitreal, Site: Left Eye   NDC: [id number], Lot: [id number], Expiration date: 01/04/2022, Waste: 0.05 mL   Post-op Post injection exam found visual acuity of at least counting fingers. The patient tolerated the procedure well. There were no complications. The patient received written and verbal post procedure care education. Post injection medications were not given.               ASSESSMENT/PLAN:    ICD-10-CM   1. Exudative age-related macular degeneration of left eye with active choroidal neovascularization (HCC)  H35.3221 Intravitreal Injection, Pharmacologic Agent - OS - Left Eye    aflibercept (EYLEA) SOLN 2 mg    2. Exudative age-related macular degeneration of right eye with inactive scar ([place])  H35.3213     3. Retinal edema  H35.81 OCT, Retina - OU - Both Eyes    4. Essential hypertension  I10     5. Hypertensive retinopathy of both eyes  H35.033     6. Pseudophakia of both eyes  Z96.1       1-3. 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    OD- w/ inactive scar -- history of low vision, CF 3'  OS- w/ CNV   - history of multiple IVL OS in [place], [place] w/ Dr. Doristine Church -- last injection 06/2018, q4-6 wk interval  - moved to [place] in November 2019 and has been followed by Dr. Baird Cancer who had recommended observation, thus pt sought second opinion  - given functional monocular status and history of active CNV, has been receiving maintenance injections, s/p IVA  #1 OS (02.25.20), #2 (04.07.20), #3 (05.05.20), #4 (06.22.20), #5 (08.03.20), #6 (03.22.21)  - s/p IVE OS #1 (09.14.20), #2 (10.23.20), #3 (11.30.20), #4 (01.11.21), #5 (02.15.21), #6 (03.22.21), #7 (05.03.21), #8 (06.16.21), #9 (07.26.21), #10 (09.07.21), #11 (10.19.21), #12 (12.01.2021), #13 (01.19.22), #14 (03.09.22), #15 (05.12.22)  - OCT shows PEDs, CNV w/ stable improvement in cystic changes OS; OD with inactive scar  - BCVA improved back to 20/50 from 20/70 OS   - recommend IVE #16 OS today, 07.08.22, maintenance w/ ext to 8 wks (slow tx and ext)  - pt prefers aggressive maintenance given functional monocular stutus  - pt wishes to be treated with IVE OS  - RBA of procedure discussed, questions answered  - informed consent obtained   - Eylea informed consent form signed and scanned on 01.11.2021  - see procedure note  - Eylea4U benefits investigation started 9.14.20 -- approved for 2021  - f/u in 9 wks -- DFE, OCT, likely injection  4,5. Hypertensive retinopathy OU  -  discussed importance of tight BP control  - monitor   6. Pseudophakia OU  - s/p CE/IOL OU  - monitor   Ophthalmic Meds Ordered this visit:  Meds ordered this encounter  Medications   aflibercept (EYLEA) SOLN 2 mg       Return in about 9 weeks (around 05/16/2021) for f/u exu ARMD OU, DFE, OCT.  There are no Patient Instructions on file for this visit.   Explained the diagnoses, plan, and follow up with the patient and they expressed understanding.  Patient expressed understanding of the importance of proper follow up care.   This document serves as a record of services personally performed by Gardiner Sleeper, MD, PhD. It was created on their behalf by San Jetty. Owens Shark, OA an ophthalmic technician. The creation of this record is the provider's dictation and/or activities during the visit.    Electronically signed by: San Jetty. Owens Shark, [place] 07.07.2022 11:24 PM  Gardiner Sleeper, M.D., Ph.D. Diseases & Surgery of  the Retina and Vitreous [facility]  I have reviewed the above documentation for accuracy and completeness, and I agree with the above. Gardiner Sleeper, M.D., Ph.D. 03/17/21 11:24 PM   Abbreviations: M myopia (nearsighted); A astigmatism; H hyperopia (farsighted); P presbyopia; Mrx spectacle prescription;  CTL contact lenses; OD right eye; OS left eye; OU both eyes  XT exotropia; ET esotropia; PEK punctate epithelial keratitis; PEE punctate epithelial erosions; DES dry eye syndrome; MGD meibomian gland dysfunction; ATs artificial tears; PFAT's preservative free artificial tears; [place] nuclear sclerotic cataract; PSC posterior subcapsular cataract; ERM epi-retinal membrane; PVD posterior vitreous detachment; RD retinal detachment; DM diabetes mellitus; DR diabetic retinopathy; NPDR non-proliferative diabetic retinopathy; PDR proliferative diabetic retinopathy; CSME clinically significant macular edema; DME diabetic macular edema; dbh dot blot hemorrhages; CWS cotton wool spot; POAG primary open angle glaucoma; C/D cup-to-disc ratio; HVF humphrey visual field; GVF goldmann visual field; OCT optical coherence tomography; IOP intraocular pressure; BRVO Branch retinal vein occlusion; CRVO central retinal vein occlusion; CRAO central retinal artery occlusion; BRAO branch retinal artery occlusion; RT retinal tear; SB scleral buckle; PPV pars plana vitrectomy; VH Vitreous hemorrhage; PRP panretinal laser photocoagulation; IVK intravitreal kenalog; VMT vitreomacular traction; MH Macular hole;  NVD neovascularization of the disc; NVE neovascularization elsewhere; AREDS age related eye disease study; ARMD age related macular degeneration; POAG primary open angle glaucoma; EBMD epithelial/anterior basement membrane dystrophy; ACIOL anterior chamber intraocular lens; IOL intraocular lens; PCIOL posterior chamber intraocular lens; Phaco/IOL phacoemulsification with intraocular lens placement; [place]  photorefractive keratectomy; LASIK laser assisted in situ keratomileusis; HTN hypertension; DM diabetes mellitus; COPD chronic obstructive pulmonary disease

## 2021-03-14 ENCOUNTER — Encounter (INDEPENDENT_AMBULATORY_CARE_PROVIDER_SITE_OTHER): Payer: Self-pay | Admitting: Ophthalmology

## 2021-03-14 ENCOUNTER — Ambulatory Visit (INDEPENDENT_AMBULATORY_CARE_PROVIDER_SITE_OTHER): Payer: Medicare Other | Admitting: Ophthalmology

## 2021-03-14 ENCOUNTER — Other Ambulatory Visit: Payer: Self-pay

## 2021-03-14 DIAGNOSIS — H3581 Retinal edema: Secondary | ICD-10-CM | POA: Diagnosis not present

## 2021-03-14 DIAGNOSIS — I10 Essential (primary) hypertension: Secondary | ICD-10-CM | POA: Diagnosis not present

## 2021-03-14 DIAGNOSIS — Z961 Presence of intraocular lens: Secondary | ICD-10-CM

## 2021-03-14 DIAGNOSIS — H353221 Exudative age-related macular degeneration, left eye, with active choroidal neovascularization: Secondary | ICD-10-CM | POA: Diagnosis not present

## 2021-03-14 DIAGNOSIS — H353213 Exudative age-related macular degeneration, right eye, with inactive scar: Secondary | ICD-10-CM | POA: Diagnosis not present

## 2021-03-14 DIAGNOSIS — H35033 Hypertensive retinopathy, bilateral: Secondary | ICD-10-CM

## 2021-03-17 ENCOUNTER — Encounter (INDEPENDENT_AMBULATORY_CARE_PROVIDER_SITE_OTHER): Payer: Self-pay | Admitting: Ophthalmology

## 2021-03-17 MED ORDER — AFLIBERCEPT 2MG/0.05ML IZ SOLN FOR KALEIDOSCOPE
2.0000 mg | INTRAVITREAL | Status: AC | PRN
  Administered 2021-03-14: 2 mg via INTRAVITREAL

## 2021-03-25 ENCOUNTER — Encounter: Payer: Self-pay | Admitting: Podiatry

## 2021-03-25 ENCOUNTER — Other Ambulatory Visit: Payer: Self-pay

## 2021-03-25 ENCOUNTER — Ambulatory Visit (INDEPENDENT_AMBULATORY_CARE_PROVIDER_SITE_OTHER): Payer: Medicare Other | Admitting: Podiatry

## 2021-03-25 ENCOUNTER — Ambulatory Visit (INDEPENDENT_AMBULATORY_CARE_PROVIDER_SITE_OTHER): Payer: Medicare Other

## 2021-03-25 DIAGNOSIS — S90851A Superficial foreign body, right foot, initial encounter: Secondary | ICD-10-CM

## 2021-03-25 DIAGNOSIS — M79675 Pain in left toe(s): Secondary | ICD-10-CM | POA: Diagnosis not present

## 2021-03-25 DIAGNOSIS — M79674 Pain in right toe(s): Secondary | ICD-10-CM | POA: Diagnosis not present

## 2021-03-25 DIAGNOSIS — L84 Corns and callosities: Secondary | ICD-10-CM

## 2021-03-25 DIAGNOSIS — B351 Tinea unguium: Secondary | ICD-10-CM

## 2021-03-25 DIAGNOSIS — I739 Peripheral vascular disease, unspecified: Secondary | ICD-10-CM | POA: Diagnosis not present

## 2021-03-28 NOTE — Progress Notes (Signed)
Subjective: Steven Williams is a pleasant 87 y.o. male patient seen today for at risk foot care. He has h/o clotting disorder and neuropathy. [name] is seen for painful thick toenails that are difficult to trim. Pain interferes with ambulation. Aggravating factors include wearing enclosed shoe gear. Pain is relieved with periodic professional debridement.  Today, he relates discomfort on the plantar aspect of his right heel. He cannot remember stepping on anything, but states the heel has been sore for a while. He denies any drainage or swelling of the foot. He denies any fever, chills, night sweats, nausea or vomiting.  PCP is Ginger Organ., MD. Last visit was: 02/24/2021.  Allergies  Allergen Reactions   Iodinated Diagnostic Agents Rash    Objective: Physical Exam  General: Steven Williams is a pleasant 87 y.o. Caucasian male, WD, WN in NAD. AAO x 3.   Vascular:  Capillary refill time to digits <4 seconds b/l lower extremities. Nonpalpable pedal pulse(s) b/l lower extremities. Pedal hair absent. Lower extremity skin temperature gradient warm to cool. No pain with calf compression b/l. No edema noted b/l lower extremities.  Dermatological:  Pedal skin is thin shiny, atrophic b/l lower extremities. Toenails 1-5 b/l elongated, discolored, dystrophic, thickened, crumbly with subungual debris and tenderness to dorsal palpation. Skin tear noted plantar aspect right heel pad. It is a dry, thin skin flap that is tender when manipulated. There is some tenderness to palpation and it is suspicious for portal of entry of foreign body. There is no erythema, no edema, no warmth, and no drainage expressed. Hyperkeratotic lesion(s) submet head 1 right foot.  No erythema, no edema, no drainage, no fluctuance.  Musculoskeletal:  Normal muscle strength 5/5 to all lower extremity muscle groups bilaterally. No pain crepitus or joint limitation noted with ROM b/l. Hammertoe(s) noted to the 1-5  bilaterally.  Neurological:  Protective sensation intact 5/5 intact bilaterally with 10g monofilament b/l. Vibratory sensation intact b/l.  Xray findings 3 views, right foot: No gas in tissues right foot. No evidence of fracture right foot. No foreign body evident at portal of entry right heel.   Assessment and Plan:  1. Pain due to onychomycosis of toenails of both feet   2. Callus   3. Foreign body of right heel   4. PAD (peripheral artery disease) ([place])     -Examined patient. -Patient to continue soft, supportive shoe gear daily. -Toenails 1-5 b/l were debrided in length and girth with sterile nail nippers and dremel without iatrogenic bleeding.  -Old hanging skin flap gently debrided right heel. Right heel cleansed with wound cleanser. Triple antibiotic ointment and band-aid applied. Mr. Garringer noted relief post-treatment today. No further treatment required. Call office if he has any problems.  -Callus(es) submet head 1 right foot pared utilizing sterile scalpel blade without complication or incident. Total number debrided =1. -Patient to report any pedal injuries to medical professional immediately. -Xray of right foot was performed and reviewed with patient and/or POA. -Patient/POA to call should there be question/concern in the interim.  Return in about 3 months (around 06/25/2021).  Marzetta Board, DPM

## 2021-03-30 ENCOUNTER — Emergency Department (HOSPITAL_COMMUNITY)
Admission: EM | Admit: 2021-03-30 | Discharge: 2021-03-30 | Disposition: A | Discharge status: Home / Self Care | Payer: Medicare Other | Attending: Emergency Medicine | Admitting: Emergency Medicine

## 2021-03-30 ENCOUNTER — Other Ambulatory Visit: Payer: Self-pay

## 2021-03-30 ENCOUNTER — Encounter (HOSPITAL_COMMUNITY): Payer: Self-pay | Admitting: Emergency Medicine

## 2021-03-30 DIAGNOSIS — E039 Hypothyroidism, unspecified: Secondary | ICD-10-CM | POA: Insufficient documentation

## 2021-03-30 DIAGNOSIS — I129 Hypertensive chronic kidney disease with stage 1 through stage 4 chronic kidney disease, or unspecified chronic kidney disease: Secondary | ICD-10-CM | POA: Insufficient documentation

## 2021-03-30 DIAGNOSIS — Z87891 Personal history of nicotine dependence: Secondary | ICD-10-CM | POA: Insufficient documentation

## 2021-03-30 DIAGNOSIS — N1831 Chronic kidney disease, stage 3a: Secondary | ICD-10-CM | POA: Diagnosis not present

## 2021-03-30 DIAGNOSIS — Z85828 Personal history of other malignant neoplasm of skin: Secondary | ICD-10-CM | POA: Insufficient documentation

## 2021-03-30 DIAGNOSIS — R040 Epistaxis: Secondary | ICD-10-CM | POA: Diagnosis not present

## 2021-03-30 DIAGNOSIS — Z7901 Long term (current) use of anticoagulants: Secondary | ICD-10-CM | POA: Insufficient documentation

## 2021-03-30 DIAGNOSIS — Z950 Presence of cardiac pacemaker: Secondary | ICD-10-CM | POA: Insufficient documentation

## 2021-03-30 DIAGNOSIS — Z79899 Other long term (current) drug therapy: Secondary | ICD-10-CM | POA: Diagnosis not present

## 2021-03-30 DIAGNOSIS — J449 Chronic obstructive pulmonary disease, unspecified: Secondary | ICD-10-CM | POA: Diagnosis not present

## 2021-03-30 LAB — CBC WITH DIFFERENTIAL/PLATELET
Abs Immature Granulocytes: 0.03 10*3/uL (ref 0.00–0.07)
Basophils Absolute: 0 10*3/uL (ref 0.0–0.1)
Basophils Relative: 1 %
Eosinophils Absolute: 0.1 10*3/uL (ref 0.0–0.5)
Eosinophils Relative: 2 %
HCT: 35.2 % — ABNORMAL LOW (ref 39.0–52.0)
Hemoglobin: 11.5 g/dL — ABNORMAL LOW (ref 13.0–17.0)
Immature Granulocytes: 1 %
Lymphocytes Relative: 15 %
Lymphs Abs: 0.8 10*3/uL (ref 0.7–4.0)
MCH: 34 pg (ref 26.0–34.0)
MCHC: 32.7 g/dL (ref 30.0–36.0)
MCV: 104.1 fL — ABNORMAL HIGH (ref 80.0–100.0)
Monocytes Absolute: 0.5 10*3/uL (ref 0.1–1.0)
Monocytes Relative: 10 %
Neutro Abs: 3.8 10*3/uL (ref 1.7–7.7)
Neutrophils Relative %: 71 %
Platelets: 73 10*3/uL — ABNORMAL LOW (ref 150–400)
RBC: 3.38 MIL/uL — ABNORMAL LOW (ref 4.22–5.81)
RDW: 13.7 % (ref 11.5–15.5)
WBC: 5.3 10*3/uL (ref 4.0–10.5)
nRBC: 0 % (ref 0.0–0.2)

## 2021-03-30 LAB — BASIC METABOLIC PANEL
Anion gap: 14 (ref 5–15)
BUN: 55 mg/dL — ABNORMAL HIGH (ref 8–23)
CO2: 20 mmol/L — ABNORMAL LOW (ref 22–32)
Calcium: 10.3 mg/dL (ref 8.9–10.3)
Chloride: 103 mmol/L (ref 98–111)
Creatinine, Ser: 2.09 mg/dL — ABNORMAL HIGH (ref 0.61–1.24)
GFR, Estimated: 30 mL/min — ABNORMAL LOW (ref >60)
Glucose, Bld: 112 mg/dL — ABNORMAL HIGH (ref 70–99)
Potassium: 3.8 mmol/L (ref 3.5–5.1)
Sodium: 137 mmol/L (ref 135–145)

## 2021-03-30 LAB — PROTIME-INR
INR: 1.1 (ref 0.8–1.2)
Prothrombin Time: 14.5 seconds (ref 11.4–15.2)

## 2021-03-30 MED ORDER — CEPHALEXIN 500 MG PO CAPS: 500.0000 mg | ORAL_CAPSULE | Freq: Two times a day (BID) | ORAL | 0 refills | Status: AC

## 2021-03-30 MED ORDER — LIDOCAINE-EPINEPHRINE (PF) 2 %-1:200000 IJ SOLN
INTRAMUSCULAR | Status: AC
  Filled 2021-03-30: qty 20

## 2021-03-30 NOTE — ED Notes (Signed)
Discharge instructions gone over with patient and daughter at bedside.   Assisted pt into changing back to regular clothes.

## 2021-03-30 NOTE — ED Provider Notes (Signed)
[place] EMERGENCY DEPARTMENT Provider Note   CSN: [id number] Arrival date & time: 03/30/21  1703     History Chief Complaint  Patient presents with   Epistaxis    Steven Williams is a 87 y.o. male past medical history significant for chronic anticoagulation for clotting disorder, HTN, chronic anemia, RA, recurrent epistaxis who presents for evaluation of nose bleeding. Began yesterday evening at living facility. Stopped with pressure. Reoccurred today. Attempted pressure, afrin without relief. Bleeding from left nares. No prior nasal or facial surgical history. No recent falls or injuries. Taking meds as prescribed. No fever, pain, cough, sob, lightheaded, dizziness. Denies additional aggrieving or alleviating factors. Unsure is he has need packing previously.  History obtained from patient, EMS and past medical records. No interpretor was used.   Collateral from daughter  Julious Payer at [phone]  HPI     Past Medical History:  Diagnosis Date   Anticoagulated 06/26/2020   Antiphospholipid antibody positive    Aortic aneurysm (HCC)    thoracic, Dr Roxan Hockey   Aortic dissection [facility])    BPH (benign prostatic hyperplasia)    Central retinal artery occlusion    Chronic anemia    Chronic renal insufficiency    COPD (chronic obstructive pulmonary disease) (HCC)    DDD (degenerative disc disease), cervical    DJD (degenerative joint disease)    DVT (deep venous thrombosis) (HCC)    Gastric dysmotility    GERD (gastroesophageal reflux disease)    Gout    HTN (hypertension)    Hyperlipidemia    Hypertensive retinopathy    OU   Hypothyroid    Macular degeneration    Myelodysplasia (myelodysplastic syndrome) (HCC)    Nasal septal deviation 06/26/2020   Paget's disease of bone    Peripheral neuropathy    Permanent atrial fibrillation (HCC)    Recurrent epistaxis 06/26/2020   Rheumatoid arthritis (HCC)    Symptomatic bradycardia    TIA  (transient ischemic attack)     Patient Active Problem List   Diagnosis Date Noted   Pain of right hip joint 02/17/2021   Descending thoracic aortic aneurysm ([place]) 10/23/2020   Hypercoagulable state ([place]) 10/23/2020   Preventative health care 10/23/2020   Anticoagulated 06/26/2020   Nasal septal deviation 06/26/2020   Recurrent epistaxis 06/26/2020   Headache 01/19/2020   Localized edema 12/29/2019   Long term (current) use of anticoagulants 12/25/2019   Dysphagia 07/06/2019   Restless legs syndrome 05/19/2019   Thrombocytopenia ([place]) 11/08/2018   Basal cell carcinoma of scalp 08/26/2018   Atherosclerotic heart disease of native coronary artery without angina pectoris 08/10/2018   Chronic kidney disease, stage 3a ([place]) 07/28/2018   Anemia 07/27/2018   Benign prostatic hyperplasia without lower urinary tract symptoms 07/27/2018   Carotid artery occlusion 07/27/2018   Essential hypertension 07/27/2018   Gout 07/27/2018   Hyperlipidemia 07/27/2018   Hypothyroidism 07/27/2018   Macular degeneration 07/27/2018   Paroxysmal atrial fibrillation ([place]) 07/27/2018   Polyneuropathy 07/27/2018   Rheumatoid arthritis ([place]) 07/27/2018    Past Surgical History:  Procedure Laterality Date   ATRIAL FIBRILLATION ABLATION  09/23/2014   Dr Encarnacion Chu in [place]     lumbar, x 2   CAROTID ENDARTERECTOMY Right 1990   CAROTID STENT     CATARACT EXTRACTION Bilateral 2010   [place]   EYE SURGERY Bilateral 2010   [place] [facility]   LUNG DECORTICATION     VATS   PACEMAKER GENERATOR CHANGE  12/03/2011  MDT [place] ADDR01 generator change by Dr Encarnacion Chu for symptomatic bradycardia   PACEMAKER IMPLANT  2004   MDT        Family History  Problem Relation Age of Onset   Cirrhosis Mother    Stroke Father    Lung cancer Sister    Lung cancer Brother     Social History   Tobacco Use   Smoking status: Former    Packs/day: 2.00    Years: 12.00    Pack years: 24.00    Types: Cigarettes     Quit date: 09/07/1962    Years since quitting: 58.6   Smokeless tobacco: Never  Vaping Use   Vaping Use: Never used  Substance Use Topics   Alcohol use: Yes    Alcohol/week: 2.0 standard drinks    Types: 2 Glasses of wine per week    Comment: daily   Drug use: Never    Home Medications Prior to Admission medications   Medication Sig Start Date End Date Taking? Authorizing Provider  cephALEXin (KEFLEX) 500 MG capsule Take 1 capsule (500 mg total) by mouth 2 (two) times daily for 7 days. 03/30/21 04/06/21 Yes Sherisa Gilvin A, PA-C  Aflibercept (EYLEA) 2 MG/0.05ML SOLN 0.05 ml 04/23/20   [provider]  allopurinol (ZYLOPRIM) 100 MG tablet allopurinol 100 mg tablet  TAKE 1 TABLET BY MOUTH DAILY Orally Once a day 90 days    [provider]  carvedilol (COREG) 6.25 MG tablet 1 tablet with food    [provider]  Cholecalciferol (VITAMIN D3) 50 MCG (2000 UT) TABS Take 1 tablet by mouth 2 (two) times daily.    [provider]  dicyclomine (BENTYL) 10 MG capsule 2 capsules    [provider]  digoxin (LANOXIN) 0.125 MG tablet Take 0.125 mg by mouth as directed. Patient takes medication on M/W/F    [provider]  Digoxin 62.5 MCG TABS 1 tablet    [provider]  doxycycline (MONODOX) 100 MG capsule Take 100 mg by mouth 2 (two) times daily. 06/10/20   [provider]  doxycycline (VIBRA-TABS) 100 MG tablet doxycycline hyclate 100 mg tablet  TAKE ONE TABLET BY MOUTH TWICE DAILY    [provider]  ELIQUIS 2.5 MG TABS tablet Take 2.5 mg by mouth 2 (two) times daily. 02/24/21   [provider]  Ferrous Sulfate (IRON) 325 (65 Fe) MG TABS Take 1 tablet by mouth 2 (two) times daily.    [provider]  furosemide (LASIX) 40 MG tablet 1 tablet    [provider]  levothyroxine (SYNTHROID) 100 MCG tablet Take 100 mcg by mouth every morning. 06/04/20   [provider]  levothyroxine  (SYNTHROID, LEVOTHROID) 88 MCG tablet Take 88 mcg by mouth daily before breakfast.    [provider]  Misc Natural Products (GLUCOSAMINE CHOND COMPLEX/MSM PO) Take 1 tablet by mouth daily.    [provider]  Multiple Vitamins-Minerals (PRESERVISION AREDS PO) Take 1 tablet by mouth 2 (two) times daily.    [provider]  mupirocin ointment (BACTROBAN) 2 % 3 (three) times daily. 06/26/20   [provider]  NON FORMULARY Antifungal nail cream from [place] - faxed 03/27/2019  [facility]    [provider]  oxymetazoline (AFRIN 12 HOUR) 0.05 % nasal spray 2 sprays in each nostril as needed 09/16/20   [provider]  pantoprazole (PROTONIX) 40 MG tablet Take by mouth. 06/04/20   [provider]  potassium  chloride SA (KLOR-CON) 20 MEQ tablet potassium chloride ER 20 mEq tablet,extended release(part/cryst)  TAKE ONE TABLET BY MOUTH WITH FOOD    [provider]  predniSONE (DELTASONE) 50 MG tablet Take 50 mg tablet 13 hours prior to CT scan, Take 50 mg tablet 7 hours prior to CT scan Take 50 mg tablet 1 hour prior to CT scan. Patient not taking: Reported on 01/16/2021 04/25/20   Melrose Nakayama, MD  predniSONE (DELTASONE) 50 MG tablet Take 50mg  prednisone on 3/1 @ 10:20pm.  Take 50mg  prednisone on 3/2 @ 4:20am.  Take 50mg  prednisone AND 50mg  benadryl on 3/2 @ 10:20am. Patient not taking: Reported on 01/16/2021 10/29/20   Logan Bores, MD  pregabalin (LYRICA) 75 MG capsule Take 75 mg by mouth 2 (two) times daily.    [provider]  QC NO DRIP NASAL RELIEF 0.05 % nasal spray  09/16/20   [provider]  Ranibizumab (LUCENTIS) 0.3 MG/0.05ML SOLN inject into eye once monthly Patient not taking: Reported on 01/16/2021 07/27/18   [provider]  rOPINIRole (REQUIP) 2 MG tablet Take 2 mg by mouth at bedtime. 11/26/19   [provider]  rosuvastatin (CRESTOR) 10 MG tablet Take 10 mg by mouth  daily.    [provider]  saw palmetto 500 MG capsule Take 500 mg by mouth 2 (two) times daily.    [provider]  silver nitrate applicators 74-12 % applicator Apply topically. 06/11/20   [provider]  tamsulosin (FLOMAX) 0.4 MG CAPS capsule Take 0.4 mg by mouth daily.    [provider]  traMADol (ULTRAM) 50 MG tablet Take 50 mg by mouth every 8 (eight) hours as needed. 01/22/21   [provider]  triamcinolone (NASACORT) 55 MCG/ACT AERO nasal inhaler 1 spray each nostril daily    [provider]  Vitamin D-Vitamin K (VITAMIN K2-VITAMIN D3) 45-2000 MCG-UNIT CAPS take 1 po bid 07/27/18   [provider]  warfarin (COUMADIN) 2 MG tablet Take 2 mg by mouth daily.    [provider]    Allergies    Iodinated diagnostic agents  Review of Systems   Review of Systems  Constitutional: Negative.   HENT:  Positive for nosebleeds.   Respiratory: Negative.    Cardiovascular: Negative.   Gastrointestinal: Negative.   Genitourinary: Negative.   Musculoskeletal: Negative.   Skin: Negative.   Neurological: Negative.   All other systems reviewed and are negative.  Physical Exam Updated Vital Signs BP 138/81   Pulse 67   Temp (!) 97.5 F (36.4 C) (Oral)   Resp 16   Ht 6\' 2"  (1.88 m)   Wt 85.3 kg   SpO2 100%   BMI 24.14 kg/m   Physical Exam Vitals and nursing note reviewed.  Constitutional:      General: He is not in acute distress.    Appearance: He is well-developed. He is not ill-appearing, toxic-appearing or diaphoretic.  HENT:     Head: Atraumatic.     Nose:     Comments: Epistaxis to left nares.  No clear bleeding point. Eyes:     Pupils: Pupils are equal, round, and reactive to light.  Cardiovascular:     Rate and Rhythm: Normal rate and regular rhythm.  Pulmonary:     Effort: Pulmonary effort is normal. No respiratory distress.  Abdominal:     General: There is no distension.     Palpations:  Abdomen is soft.  Musculoskeletal:  General: Normal range of motion.     Cervical back: Normal range of motion and neck supple.  Skin:    General: Skin is warm and dry.  Neurological:     General: No focal deficit present.     Mental Status: He is alert and oriented to person, place, and time.    ED Results / Procedures / Treatments   Labs (all labs ordered are listed, but only abnormal results are displayed) Labs Reviewed  CBC WITH DIFFERENTIAL/PLATELET - Abnormal; Notable for the following components:      Result Value   RBC 3.38 (*)    Hemoglobin 11.5 (*)    HCT 35.2 (*)    MCV 104.1 (*)    Platelets 73 (*)    All other components within normal limits  BASIC METABOLIC PANEL - Abnormal; Notable for the following components:   CO2 20 (*)    Glucose, Bld 112 (*)    BUN 55 (*)    Creatinine, Ser 2.09 (*)    GFR, Estimated 30 (*)    All other components within normal limits  PROTIME-INR    EKG None  Radiology No results found.  Procedures .Epistaxis Management  Date/Time: 03/30/2021 8:33 PM Performed by: Nettie Elm, PA-C Authorized by: Nettie Elm, PA-C   Consent:    Consent obtained:  Verbal   Consent given by:  Patient   Risks, benefits, and alternatives were discussed: yes     Risks discussed:  Bleeding, infection, nasal injury and pain   Alternatives discussed:  No treatment, delayed treatment, alternative treatment, observation and referral Universal protocol:    Procedure explained and questions answered to patient or proxy's satisfaction: yes     Relevant documents present and verified: yes     Test results available: yes     Imaging studies available: yes     Required blood products, implants, devices, and special equipment available: yes     Site/side marked: yes     Immediately prior to procedure, a time out was called: yes     Patient identity confirmed:  Verbally with patient Anesthesia:    Anesthesia method:  Topical  application   Topical anesthetic:  Epinephrine Procedure details:    Treatment site:  Unable to specify   Treatment method:  Nasal balloon   Treatment complexity:  Extensive   Treatment episode: initial   Post-procedure details:    Assessment:  Bleeding stopped   Procedure completion:  Tolerated well, no immediate complications   Medications Ordered in ED Medications  lidocaine-EPINEPHrine (XYLOCAINE W/EPI) 2 %-1:200000 (PF) injection (has no administration in time range)    ED Course  I have reviewed the triage vital signs and the nursing notes.  Pertinent labs & imaging results that were available during my care of the patient were reviewed by me and considered in my medical decision making (see chart for details).  Here for evaluation epistatic 6.  History of recurrent nosebleed.  He is chronically anticoagulated, recently switched from warfarin to [place] 1 week ago.  No recent traumatic injuries.  No prior facial or nasal surgeries.  No lightheadedness or dizziness.  Appears otherwise well.  Heart and lungs clear.  Abdomen soft, nontender.  No respiratory distress.  He is spitting up blood clots which are likely from active nosebleed from left nares.  I am unable to see the actual point of bleeding.  Had tried pressure, Afrin with EMS.  Attempted nebulized lighter with epi as well as nasal  packing.  Will check labs, keep close eye on patient reassess. Will need keflex and ENT FU at dc  Clinical Course as of 03/30/21 2043  Sun Mar 30, 2021  1800 Recheck no active bleed after packing placed [BH]  1922 Reassessed. Spit up some dark brown. However active bleeding [BH]  2020 Reassessed no active bleed. Labs stable, dc home [BH]    Clinical Course User Index [BH] Efstathios Sawin A, PA-C    Reassessed multiple times.  Labs stable.  No active bleeding after packing placed.  DC home on Keflex, discussed follow-up with daughter, Stanton Kidney.  Patient will need to follow-up in 5 days to have  packing removed.  Will return for new or worsening symptoms.  The patient has been appropriately medically screened and/or stabilized in the ED. I have low suspicion for any other emergent medical condition which would require further screening, evaluation or treatment in the ED or require inpatient management.  Patient is hemodynamically stable and in no acute distress.  Patient able to ambulate in department prior to ED.  Evaluation does not show acute pathology that would require ongoing or additional emergent interventions while in the emergency department or further inpatient treatment.  I have discussed the diagnosis with the patient and answered all questions.  Pain is been managed while in the emergency department and patient has no further complaints prior to discharge.  Patient is comfortable with plan discussed in room and is stable for discharge at this time.  I have discussed strict return precautions for returning to the emergency department.  Patient was encouraged to follow-up with PCP/specialist refer to at discharge.  MDM Rules/Calculators/A&P                            Final Clinical Impression(s) / ED Diagnoses Final diagnoses:  Epistaxis    Rx / DC Orders ED Discharge Orders          Ordered    cephALEXin (KEFLEX) 500 MG capsule  2 times daily        03/30/21 2040             Torianna Junio A, PA-C 03/30/21 2043    Isla Pence, MD 03/30/21 2309

## 2021-03-30 NOTE — Discharge Instructions (Signed)
Follow up with the ear nose and throat doctors in 5 days to have the packing removed  Return for new or worsening symptoms  Take the antibiotics as prescribed

## 2021-04-01 ENCOUNTER — Emergency Department (HOSPITAL_COMMUNITY)
Admission: EM | Admit: 2021-04-01 | Discharge: 2021-04-01 | Disposition: A | Discharge status: Home / Self Care | Payer: Medicare Other | Attending: Emergency Medicine | Admitting: Emergency Medicine

## 2021-04-01 ENCOUNTER — Other Ambulatory Visit: Payer: Self-pay

## 2021-04-01 DIAGNOSIS — N1831 Chronic kidney disease, stage 3a: Secondary | ICD-10-CM | POA: Diagnosis not present

## 2021-04-01 DIAGNOSIS — Z950 Presence of cardiac pacemaker: Secondary | ICD-10-CM | POA: Diagnosis not present

## 2021-04-01 DIAGNOSIS — Z79899 Other long term (current) drug therapy: Secondary | ICD-10-CM | POA: Diagnosis not present

## 2021-04-01 DIAGNOSIS — R040 Epistaxis: Secondary | ICD-10-CM

## 2021-04-01 DIAGNOSIS — E039 Hypothyroidism, unspecified: Secondary | ICD-10-CM | POA: Diagnosis not present

## 2021-04-01 DIAGNOSIS — Z85828 Personal history of other malignant neoplasm of skin: Secondary | ICD-10-CM | POA: Diagnosis not present

## 2021-04-01 DIAGNOSIS — Z87891 Personal history of nicotine dependence: Secondary | ICD-10-CM | POA: Insufficient documentation

## 2021-04-01 DIAGNOSIS — I4891 Unspecified atrial fibrillation: Secondary | ICD-10-CM | POA: Diagnosis not present

## 2021-04-01 DIAGNOSIS — Z7901 Long term (current) use of anticoagulants: Secondary | ICD-10-CM | POA: Insufficient documentation

## 2021-04-01 DIAGNOSIS — J449 Chronic obstructive pulmonary disease, unspecified: Secondary | ICD-10-CM | POA: Diagnosis not present

## 2021-04-01 DIAGNOSIS — I129 Hypertensive chronic kidney disease with stage 1 through stage 4 chronic kidney disease, or unspecified chronic kidney disease: Secondary | ICD-10-CM | POA: Insufficient documentation

## 2021-04-01 LAB — CBC WITH DIFFERENTIAL/PLATELET
Abs Immature Granulocytes: 0.02 10*3/uL (ref 0.00–0.07)
Basophils Absolute: 0 10*3/uL (ref 0.0–0.1)
Basophils Relative: 1 %
Eosinophils Absolute: 0.1 10*3/uL (ref 0.0–0.5)
Eosinophils Relative: 2 %
HCT: 34.3 % — ABNORMAL LOW (ref 39.0–52.0)
Hemoglobin: 11.1 g/dL — ABNORMAL LOW (ref 13.0–17.0)
Immature Granulocytes: 0 %
Lymphocytes Relative: 16 %
Lymphs Abs: 0.9 10*3/uL (ref 0.7–4.0)
MCH: 34 pg (ref 26.0–34.0)
MCHC: 32.4 g/dL (ref 30.0–36.0)
MCV: 105.2 fL — ABNORMAL HIGH (ref 80.0–100.0)
Monocytes Absolute: 0.9 10*3/uL (ref 0.1–1.0)
Monocytes Relative: 15 %
Neutro Abs: 3.8 10*3/uL (ref 1.7–7.7)
Neutrophils Relative %: 66 %
Platelets: 84 10*3/uL — ABNORMAL LOW (ref 150–400)
RBC: 3.26 MIL/uL — ABNORMAL LOW (ref 4.22–5.81)
RDW: 13.7 % (ref 11.5–15.5)
Smear Review: NORMAL
WBC: 5.8 10*3/uL (ref 4.0–10.5)
nRBC: 0 % (ref 0.0–0.2)

## 2021-04-01 LAB — BASIC METABOLIC PANEL
Anion gap: 11 (ref 5–15)
BUN: 57 mg/dL — ABNORMAL HIGH (ref 8–23)
CO2: 26 mmol/L (ref 22–32)
Calcium: 10.3 mg/dL (ref 8.9–10.3)
Chloride: 102 mmol/L (ref 98–111)
Creatinine, Ser: 2.37 mg/dL — ABNORMAL HIGH (ref 0.61–1.24)
GFR, Estimated: 26 mL/min — ABNORMAL LOW (ref >60)
Glucose, Bld: 109 mg/dL — ABNORMAL HIGH (ref 70–99)
Potassium: 3.7 mmol/L (ref 3.5–5.1)
Sodium: 139 mmol/L (ref 135–145)

## 2021-04-01 NOTE — ED Provider Notes (Signed)
[facility] EMERGENCY DEPARTMENT Provider Note   CSN: [id number] Arrival date & time: 04/01/21  1211     History Chief Complaint  Patient presents with   Epistaxis    Steven Williams is a 87 y.o. male.  Patient presents to the ED with a chief complaint of nosebleed.  States that the bleeding first started 2 days ago.  He was seen here and had nasal packing placed.  He was encouraged to follow-up outpatient.  Patient states that this morning he has had slight bleeding from his nose.  He reports that it is minimal.  He denies any other associated symptoms.  He has about 10 tissues with him that have a few drops of blood on each of them that he says he has been using since he arrived in the ED 10 hours ago.  He states that he has not ever had significant bleeding since having the packing placed.  He denies any vomiting.  He denies blood running down the back of his throat.  The history is provided by the patient. No language interpreter was used.      Past Medical History:  Diagnosis Date   Anticoagulated 06/26/2020   Antiphospholipid antibody positive    Aortic aneurysm (HCC)    thoracic, Dr Roxan Hockey   Aortic dissection [facility])    BPH (benign prostatic hyperplasia)    Central retinal artery occlusion    Chronic anemia    Chronic renal insufficiency    COPD (chronic obstructive pulmonary disease) (HCC)    DDD (degenerative disc disease), cervical    DJD (degenerative joint disease)    DVT (deep venous thrombosis) (HCC)    Gastric dysmotility    GERD (gastroesophageal reflux disease)    Gout    HTN (hypertension)    Hyperlipidemia    Hypertensive retinopathy    OU   Hypothyroid    Macular degeneration    Myelodysplasia (myelodysplastic syndrome) ([place])    Nasal septal deviation 06/26/2020   Paget's disease of bone    Peripheral neuropathy    Permanent atrial fibrillation (HCC)    Recurrent epistaxis 06/26/2020   Rheumatoid arthritis (HCC)     Symptomatic bradycardia    TIA (transient ischemic attack)     Patient Active Problem List   Diagnosis Date Noted   Pain of right hip joint 02/17/2021   Descending thoracic aortic aneurysm ([place]) 10/23/2020   Hypercoagulable state ([place]) 10/23/2020   Preventative health care 10/23/2020   Anticoagulated 06/26/2020   Nasal septal deviation 06/26/2020   Recurrent epistaxis 06/26/2020   Headache 01/19/2020   Localized edema 12/29/2019   Long term (current) use of anticoagulants 12/25/2019   Dysphagia 07/06/2019   Restless legs syndrome 05/19/2019   Thrombocytopenia ([place]) 11/08/2018   Basal cell carcinoma of scalp 08/26/2018   Atherosclerotic heart disease of native coronary artery without angina pectoris 08/10/2018   Chronic kidney disease, stage 3a ([place]) 07/28/2018   Anemia 07/27/2018   Benign prostatic hyperplasia without lower urinary tract symptoms 07/27/2018   Carotid artery occlusion 07/27/2018   Essential hypertension 07/27/2018   Gout 07/27/2018   Hyperlipidemia 07/27/2018   Hypothyroidism 07/27/2018   Macular degeneration 07/27/2018   Paroxysmal atrial fibrillation ([place]) 07/27/2018   Polyneuropathy 07/27/2018   Rheumatoid arthritis ([place]) 07/27/2018    Past Surgical History:  Procedure Laterality Date   ATRIAL FIBRILLATION ABLATION  09/23/2014   Dr Encarnacion Chu in [place]     lumbar, x 2   CAROTID ENDARTERECTOMY Right  [place]     CATARACT EXTRACTION Bilateral 2010   [place]   EYE SURGERY Bilateral 2010   Cat S[facility]   LUNG DECORTICATION     VATS   PACEMAKER GENERATOR CHANGE  12/03/2011   MDT [facility] ADDR01 generator change by Dr Encarnacion Chu for symptomatic bradycardia   PACEMAKER IMPLANT  2004   MDT        Family History  Problem Relation Age of Onset   Cirrhosis Mother    Stroke Father    Lung cancer Sister    Lung cancer Brother     Social History   Tobacco Use   Smoking status: Former    Packs/day: 2.00    Years: 12.00    Pack  years: 24.00    Types: Cigarettes    Quit date: 09/07/1962    Years since quitting: 58.6   Smokeless tobacco: Never  Vaping Use   Vaping Use: Never used  Substance Use Topics   Alcohol use: Yes    Alcohol/week: 2.0 standard drinks    Types: 2 Glasses of wine per week    Comment: daily   Drug use: Never    Home Medications Prior to Admission medications   Medication Sig Start Date End Date Taking? Authorizing Provider  Aflibercept (EYLEA) 2 MG/0.05ML SOLN 0.05 ml 04/23/20   [provider]  allopurinol (ZYLOPRIM) 100 MG tablet allopurinol 100 mg tablet  TAKE 1 TABLET BY MOUTH DAILY Orally Once a day 90 days    [provider]  carvedilol (COREG) 6.25 MG tablet 1 tablet with food    [provider]  cephALEXin (KEFLEX) 500 MG capsule Take 1 capsule (500 mg total) by mouth 2 (two) times daily for 7 days. 03/30/21 04/06/21  Henderly, Britni A, PA-C  Cholecalciferol (VITAMIN D3) 50 MCG (2000 UT) TABS Take 1 tablet by mouth 2 (two) times daily.    [provider]  dicyclomine (BENTYL) 10 MG capsule 2 capsules    [provider]  digoxin (LANOXIN) 0.125 MG tablet Take 0.125 mg by mouth as directed. Patient takes medication on M/W/F    [provider]  Digoxin 62.5 MCG TABS 1 tablet    [provider]  doxycycline (MONODOX) 100 MG capsule Take 100 mg by mouth 2 (two) times daily. 06/10/20   [provider]  doxycycline (VIBRA-TABS) 100 MG tablet doxycycline hyclate 100 mg tablet  TAKE ONE TABLET BY MOUTH TWICE DAILY    [provider]  ELIQUIS 2.5 MG TABS tablet Take 2.5 mg by mouth 2 (two) times daily. 02/24/21   [provider]  Ferrous Sulfate (IRON) 325 (65 Fe) MG TABS Take 1 tablet by mouth 2 (two) times daily.    [provider]  furosemide (LASIX) 40 MG tablet 1 tablet    [provider]  levothyroxine (SYNTHROID) 100 MCG tablet Take 100 mcg by mouth every morning. 06/04/20   [provider]  levothyroxine (SYNTHROID, LEVOTHROID) 88 MCG tablet Take 88 mcg by mouth daily before breakfast.    [provider]  Misc Natural Products (GLUCOSAMINE CHOND COMPLEX/MSM PO) Take 1 tablet by mouth daily.    [provider]  Multiple Vitamins-Minerals (PRESERVISION AREDS PO) Take 1 tablet by mouth 2 (two) times daily.    [provider]  mupirocin ointment (BACTROBAN) 2 % 3 (three) times daily. 06/26/20   [provider]  NON FORMULARY Antifungal nail cream from [facility] - faxed 03/27/2019  [facility]  [provider]  oxymetazoline (AFRIN 12 HOUR) 0.05 % nasal spray 2 sprays in each nostril as needed 09/16/20   [provider]  pantoprazole (PROTONIX) 40 MG tablet Take by mouth. 06/04/20   [provider]  potassium chloride SA (KLOR-CON) 20 MEQ tablet potassium chloride ER 20 mEq tablet,extended release(part/cryst)  TAKE ONE TABLET BY MOUTH WITH FOOD    [provider]  predniSONE (DELTASONE) 50 MG tablet Take 50 mg tablet 13 hours prior to CT scan, Take 50 mg tablet 7 hours prior to CT scan Take 50 mg tablet 1 hour prior to CT scan. Patient not taking: Reported on 01/16/2021 04/25/20   Melrose Nakayama, MD  predniSONE (DELTASONE) 50 MG tablet Take 50mg  prednisone on 3/1 @ 10:20pm.  Take 50mg  prednisone on 3/2 @ 4:20am.  Take 50mg  prednisone AND 50mg  benadryl on 3/2 @ 10:20am. Patient not taking: Reported on 01/16/2021 10/29/20   Logan Bores, MD  pregabalin (LYRICA) 75 MG capsule Take 75 mg by mouth 2 (two) times daily.    [provider]  QC NO DRIP NASAL RELIEF 0.05 % nasal spray  09/16/20   [provider]  Ranibizumab (LUCENTIS) 0.3 MG/0.05ML SOLN inject into eye once monthly Patient not taking: Reported on 01/16/2021 07/27/18   [provider]  rOPINIRole (REQUIP) 2 MG tablet Take 2 mg by mouth at bedtime. 11/26/19   [provider]  rosuvastatin (CRESTOR) 10 MG  tablet Take 10 mg by mouth daily.    [provider]  saw palmetto 500 MG capsule Take 500 mg by mouth 2 (two) times daily.    [provider]  silver nitrate applicators 26-94 % applicator Apply topically. 06/11/20   [provider]  tamsulosin (FLOMAX) 0.4 MG CAPS capsule Take 0.4 mg by mouth daily.    [provider]  traMADol (ULTRAM) 50 MG tablet Take 50 mg by mouth every 8 (eight) hours as needed. 01/22/21   [provider]  triamcinolone (NASACORT) 55 MCG/ACT AERO nasal inhaler 1 spray each nostril daily    [provider]  Vitamin D-Vitamin K (VITAMIN K2-VITAMIN D3) 45-2000 MCG-UNIT CAPS take 1 po bid 07/27/18   [provider]  warfarin (COUMADIN) 2 MG tablet Take 2 mg by mouth daily.    [provider]    Allergies    Iodinated diagnostic agents  Review of Systems   Review of Systems  All other systems reviewed and are negative.  Physical Exam Updated Vital Signs BP 132/78   Pulse (!) 57   Temp (!) 97.4 F (36.3 C)   Resp 15   SpO2 (!) 82%   Physical Exam Vitals and nursing note reviewed.  Constitutional:      General: He is not in acute distress.    Appearance: He is well-developed. He is not ill-appearing.     Comments: Alert No acute distress  HENT:     Head: Normocephalic and atraumatic.     Nose:     Comments: Nasal packing in place No significant bleeding Eyes:     Conjunctiva/sclera: Conjunctivae normal.  Cardiovascular:     Rate and Rhythm: Normal rate.  Pulmonary:     Effort: Pulmonary effort is normal. No respiratory distress.  Abdominal:     General: There is no distension.  Musculoskeletal:     Cervical back: Neck supple.     Comments: Moves all extremities  Skin:    General: Skin is warm and dry.  Neurological:  Mental Status: He is alert and oriented to person, place, and time.  Psychiatric:        Mood and Affect: Mood normal.        Behavior: Behavior normal.     ED Results / Procedures / Treatments   Labs (all labs ordered are listed, but only abnormal results are displayed) Labs Reviewed  BASIC METABOLIC PANEL - Abnormal; Notable for the following components:      Result Value   Glucose, Bld 109 (*)    BUN 57 (*)    Creatinine, Ser 2.37 (*)    GFR, Estimated 26 (*)    All other components within normal limits  CBC WITH DIFFERENTIAL/PLATELET - Abnormal; Notable for the following components:   RBC 3.26 (*)    Hemoglobin 11.1 (*)    HCT 34.3 (*)    MCV 105.2 (*)    Platelets 84 (*)    All other components within normal limits    EKG None  Radiology No results found.  Procedures Procedures   Medications Ordered in ED Medications - No data to display  ED Course  I have reviewed the triage vital signs and the nursing notes.  Pertinent labs & imaging results that were available during my care of the patient were reviewed by me and considered in my medical decision making (see chart for details).    MDM Rules/Calculators/A&P                           Patient here with nosebleed.  Has nasal packing in place, which seems to be working well.  He has had scant bleeding today.  HGB 11.1, is stable.  He does have baseline low platelets PLT 84.  He denies having any blood running down the back of his throat.  He states that he is tired and ready to go home.  Patient seen by and discussed with Dr. Dina Rich, who agrees with the plan. Final Clinical Impression(s) / ED Diagnoses Final diagnoses:  Epistaxis    Rx / DC Orders ED Discharge Orders     None        Montine Circle, PA-C 04/01/21 2336    Merryl Hacker, MD 04/06/21 [phone]

## 2021-04-01 NOTE — ED Triage Notes (Signed)
Pt with nosebleed out of L nostril, recurrent today. Seen for same Sunday and had packing placed. Minimal bleeding on arrival to ED.

## 2021-04-01 NOTE — ED Provider Notes (Signed)
Emergency Medicine Provider Triage Evaluation Note  Steven Williams , a 87 y.o. male  was evaluated in triage.  Pt complains of nose bleed x 1 day. Started this AM. Was seen for the same on Sunday and packing placed. Prescribed antibiotic although hasn't started it.   Review of Systems  Positive: epistaxis Negative: Dizziness, lightheaded, chest pain  Physical Exam  BP 115/67   Pulse 65   Temp (!) 97.4 F (36.3 C) (Oral)   Resp 14   SpO2 100%  Gen:   Awake, no distress   Resp:  Normal effort  MSK:   Moves extremities without difficulty  Other:  Packing in left nare, slow bleeding, no clots seen in nose or mouth  Medical Decision Making  Medically screening exam initiated at 12:41 PM.  Appropriate orders placed.  Cleophus Hindley was informed that the remainder of the evaluation will be completed by another provider, this initial triage assessment does not replace that evaluation, and the importance of remaining in the ED until their evaluation is complete.  Nose bleed, no signs of hemorrhage. CBC and BMP ordered.   Portions of this note were generated with [employer]. Dictation errors may occur despite best attempts at proofreading.    Barrie Folk, PA-C 04/01/21 1244    Lajean Saver, MD 04/03/21 1311

## 2021-04-08 ENCOUNTER — Other Ambulatory Visit: Payer: Self-pay | Admitting: Thoracic Surgery (Cardiothoracic Vascular Surgery)

## 2021-04-08 DIAGNOSIS — I712 Thoracic aortic aneurysm, without rupture, unspecified: Secondary | ICD-10-CM

## 2021-04-08 NOTE — Progress Notes (Unsigned)
ct 

## 2021-04-20 ENCOUNTER — Encounter (HOSPITAL_COMMUNITY): Payer: Self-pay | Admitting: Emergency Medicine

## 2021-04-20 ENCOUNTER — Emergency Department (HOSPITAL_COMMUNITY)
Admission: EM | Admit: 2021-04-20 | Discharge: 2021-04-20 | Disposition: A | Discharge status: Home / Self Care | Payer: Medicare Other | Attending: Emergency Medicine | Admitting: Emergency Medicine

## 2021-04-20 DIAGNOSIS — E039 Hypothyroidism, unspecified: Secondary | ICD-10-CM | POA: Diagnosis not present

## 2021-04-20 DIAGNOSIS — I2510 Atherosclerotic heart disease of native coronary artery without angina pectoris: Secondary | ICD-10-CM | POA: Diagnosis not present

## 2021-04-20 DIAGNOSIS — N1831 Chronic kidney disease, stage 3a: Secondary | ICD-10-CM | POA: Diagnosis not present

## 2021-04-20 DIAGNOSIS — Z87891 Personal history of nicotine dependence: Secondary | ICD-10-CM | POA: Insufficient documentation

## 2021-04-20 DIAGNOSIS — Z85828 Personal history of other malignant neoplasm of skin: Secondary | ICD-10-CM | POA: Diagnosis not present

## 2021-04-20 DIAGNOSIS — I129 Hypertensive chronic kidney disease with stage 1 through stage 4 chronic kidney disease, or unspecified chronic kidney disease: Secondary | ICD-10-CM | POA: Diagnosis not present

## 2021-04-20 DIAGNOSIS — R040 Epistaxis: Secondary | ICD-10-CM | POA: Diagnosis present

## 2021-04-20 DIAGNOSIS — Z7901 Long term (current) use of anticoagulants: Secondary | ICD-10-CM | POA: Diagnosis not present

## 2021-04-20 DIAGNOSIS — Z79899 Other long term (current) drug therapy: Secondary | ICD-10-CM | POA: Insufficient documentation

## 2021-04-20 DIAGNOSIS — Z950 Presence of cardiac pacemaker: Secondary | ICD-10-CM | POA: Diagnosis not present

## 2021-04-20 DIAGNOSIS — J449 Chronic obstructive pulmonary disease, unspecified: Secondary | ICD-10-CM | POA: Diagnosis not present

## 2021-04-20 LAB — CBC WITH DIFFERENTIAL/PLATELET
Abs Immature Granulocytes: 0.03 10*3/uL (ref 0.00–0.07)
Basophils Absolute: 0 10*3/uL (ref 0.0–0.1)
Basophils Relative: 1 %
Eosinophils Absolute: 0.1 10*3/uL (ref 0.0–0.5)
Eosinophils Relative: 3 %
HCT: 32.9 % — ABNORMAL LOW (ref 39.0–52.0)
Hemoglobin: 11 g/dL — ABNORMAL LOW (ref 13.0–17.0)
Immature Granulocytes: 1 %
Lymphocytes Relative: 18 %
Lymphs Abs: 0.8 10*3/uL (ref 0.7–4.0)
MCH: 34.4 pg — ABNORMAL HIGH (ref 26.0–34.0)
MCHC: 33.4 g/dL (ref 30.0–36.0)
MCV: 102.8 fL — ABNORMAL HIGH (ref 80.0–100.0)
Monocytes Absolute: 0.4 10*3/uL (ref 0.1–1.0)
Monocytes Relative: 10 %
Neutro Abs: 2.9 10*3/uL (ref 1.7–7.7)
Neutrophils Relative %: 67 %
Platelets: 111 10*3/uL — ABNORMAL LOW (ref 150–400)
RBC: 3.2 MIL/uL — ABNORMAL LOW (ref 4.22–5.81)
RDW: 13.7 % (ref 11.5–15.5)
WBC: 4.3 10*3/uL (ref 4.0–10.5)
nRBC: 0 % (ref 0.0–0.2)

## 2021-04-20 LAB — BASIC METABOLIC PANEL
Anion gap: 14 (ref 5–15)
BUN: 61 mg/dL — ABNORMAL HIGH (ref 8–23)
CO2: 23 mmol/L (ref 22–32)
Calcium: 9.5 mg/dL (ref 8.9–10.3)
Chloride: 102 mmol/L (ref 98–111)
Creatinine, Ser: 2.11 mg/dL — ABNORMAL HIGH (ref 0.61–1.24)
GFR, Estimated: 29 mL/min — ABNORMAL LOW (ref >60)
Glucose, Bld: 99 mg/dL (ref 70–99)
Potassium: 3.1 mmol/L — ABNORMAL LOW (ref 3.5–5.1)
Sodium: 139 mmol/L (ref 135–145)

## 2021-04-20 LAB — PROTIME-INR
INR: 1.1 (ref 0.8–1.2)
Prothrombin Time: 14.5 seconds (ref 11.4–15.2)

## 2021-04-20 MED ORDER — SILVER NITRATE-POT NITRATE 75-25 % EX MISC
1.0000 | CUTANEOUS | Status: AC
  Administered 2021-04-20: 1 via TOPICAL
  Filled 2021-04-20: qty 1

## 2021-04-20 MED ORDER — OXYMETAZOLINE HCL 0.05 % NA SOLN
2.0000 | Freq: Once | NASAL | Status: AC
  Administered 2021-04-20: 2 via NASAL

## 2021-04-20 NOTE — ED Notes (Signed)
MD applied afrin, Collie Siad RN at bedside with pressure to pt's bilateral nares at this time, awaiting MD re-assessment. Pt denies pain or needs, no distress noted.

## 2021-04-20 NOTE — ED Provider Notes (Signed)
[facility] EMERGENCY DEPARTMENT Provider Note   CSN: [id number] Arrival date & time: 04/20/21  1409     History CC - Epistaxis  Steven Williams is a 87 y.o. male.  HPI  87 year old male with a past medical history of antiphospholipid antibody positive, previous epistaxis, atrial fibrillation on Eliquis presenting to the emergency department with epistaxis.  Patient reports that his nose began bleeding this morning.  He has been seen by ENT for this before and he usually applies Vaseline to the inside of his nares but did not this morning.  He denies blowing his nose, denies hitting his nose.  He denies any digital trauma.  Patient states that his left-sided epistaxis has been constant for the past 4 hours.  He has a device that applies direct nasal pressure at home but it did not manage it.  He tried some Afrin also at home, but also did not work.  He denies any lightheadedness, syncope, shortness of breath.  Past Medical History:  Diagnosis Date   Anticoagulated 06/26/2020   Antiphospholipid antibody positive    Aortic aneurysm (HCC)    thoracic, Dr Roxan Hockey   Aortic dissection [facility])    BPH (benign prostatic hyperplasia)    Central retinal artery occlusion    Chronic anemia    Chronic renal insufficiency    COPD (chronic obstructive pulmonary disease) (HCC)    DDD (degenerative disc disease), cervical    DJD (degenerative joint disease)    DVT (deep venous thrombosis) (HCC)    Gastric dysmotility    GERD (gastroesophageal reflux disease)    Gout    HTN (hypertension)    Hyperlipidemia    Hypertensive retinopathy    OU   Hypothyroid    Macular degeneration    Myelodysplasia (myelodysplastic syndrome) ([place])    Nasal septal deviation 06/26/2020   Paget's disease of bone    Peripheral neuropathy    Permanent atrial fibrillation (HCC)    Recurrent epistaxis 06/26/2020   Rheumatoid arthritis (HCC)    Symptomatic bradycardia    TIA (transient ischemic  attack)     Patient Active Problem List   Diagnosis Date Noted   Pain of right hip joint 02/17/2021   Descending thoracic aortic aneurysm ([place]) 10/23/2020   Hypercoagulable state ([place]) 10/23/2020   Preventative health care 10/23/2020   Anticoagulated 06/26/2020   Nasal septal deviation 06/26/2020   Recurrent epistaxis 06/26/2020   Headache 01/19/2020   Localized edema 12/29/2019   Long term (current) use of anticoagulants 12/25/2019   Dysphagia 07/06/2019   Restless legs syndrome 05/19/2019   Thrombocytopenia ([place]) 11/08/2018   Basal cell carcinoma of scalp 08/26/2018   Atherosclerotic heart disease of native coronary artery without angina pectoris 08/10/2018   Chronic kidney disease, stage 3a ([place]) 07/28/2018   Anemia 07/27/2018   Benign prostatic hyperplasia without lower urinary tract symptoms 07/27/2018   Carotid artery occlusion 07/27/2018   Essential hypertension 07/27/2018   Gout 07/27/2018   Hyperlipidemia 07/27/2018   Hypothyroidism 07/27/2018   Macular degeneration 07/27/2018   Paroxysmal atrial fibrillation ([place]) 07/27/2018   Polyneuropathy 07/27/2018   Rheumatoid arthritis ([place]) 07/27/2018    Past Surgical History:  Procedure Laterality Date   ATRIAL FIBRILLATION ABLATION  09/23/2014   Dr Encarnacion Chu in [place]     lumbar, x 2   CAROTID ENDARTERECTOMY Right 1990   CAROTID STENT     CATARACT EXTRACTION Bilateral 2010   [place]   EYE SURGERY Bilateral 2010   Cat  Sx   LUNG DECORTICATION     VATS   PACEMAKER GENERATOR CHANGE  12/03/2011   MDT Adapta ADDR01 generator change by Dr Encarnacion Chu for symptomatic bradycardia   PACEMAKER IMPLANT  2004   MDT        Family History  Problem Relation Age of Onset   Cirrhosis Mother    Stroke Father    Lung cancer Sister    Lung cancer Brother     Social History   Tobacco Use   Smoking status: Former    Packs/day: 2.00    Years: 12.00    Pack years: 24.00    Types: Cigarettes    Quit date:  09/07/1962    Years since quitting: 58.6   Smokeless tobacco: Never  Vaping Use   Vaping Use: Never used  Substance Use Topics   Alcohol use: Yes    Alcohol/week: 2.0 standard drinks    Types: 2 Glasses of wine per week    Comment: daily   Drug use: Never    Home Medications Prior to Admission medications   Medication Sig Start Date End Date Taking? Authorizing Provider  Aflibercept (EYLEA) 2 MG/0.05ML SOLN 0.05 ml 04/23/20   [provider]  allopurinol (ZYLOPRIM) 100 MG tablet allopurinol 100 mg tablet  TAKE 1 TABLET BY MOUTH DAILY Orally Once a day 90 days    [provider]  carvedilol (COREG) 6.25 MG tablet 1 tablet with food    [provider]  Cholecalciferol (VITAMIN D3) 50 MCG (2000 UT) TABS Take 1 tablet by mouth 2 (two) times daily.    [provider]  dicyclomine (BENTYL) 10 MG capsule 2 capsules    [provider]  digoxin (LANOXIN) 0.125 MG tablet Take 0.125 mg by mouth as directed. Patient takes medication on M/W/F    [provider]  Digoxin 62.5 MCG TABS 1 tablet    [provider]  doxycycline (MONODOX) 100 MG capsule Take 100 mg by mouth 2 (two) times daily. 06/10/20   [provider]  doxycycline (VIBRA-TABS) 100 MG tablet doxycycline hyclate 100 mg tablet  TAKE ONE TABLET BY MOUTH TWICE DAILY    [provider]  ELIQUIS 2.5 MG TABS tablet Take 2.5 mg by mouth 2 (two) times daily. 02/24/21   [provider]  Ferrous Sulfate (IRON) 325 (65 Fe) MG TABS Take 1 tablet by mouth 2 (two) times daily.    [provider]  furosemide (LASIX) 40 MG tablet 1 tablet    [provider]  levothyroxine (SYNTHROID) 100 MCG tablet Take 100 mcg by mouth every morning. 06/04/20   [provider]  levothyroxine (SYNTHROID, LEVOTHROID) 88 MCG tablet Take 88 mcg by mouth daily before breakfast.    [provider]  Misc Natural Products (GLUCOSAMINE CHOND COMPLEX/MSM PO)  Take 1 tablet by mouth daily.    [provider]  Multiple Vitamins-Minerals (PRESERVISION AREDS PO) Take 1 tablet by mouth 2 (two) times daily.    [provider]  mupirocin ointment (BACTROBAN) 2 % 3 (three) times daily. 06/26/20   [provider]  NON FORMULARY Antifungal nail cream from [place] - faxed 03/27/2019  [facility]    [provider]  oxymetazoline (AFRIN 12 HOUR) 0.05 % nasal spray 2 sprays in each nostril as needed 09/16/20   [provider]  pantoprazole (PROTONIX) 40 MG tablet Take by mouth. 06/04/20   [provider]  potassium chloride SA (KLOR-CON) 20 MEQ tablet potassium chloride ER  20 mEq tablet,extended release(part/cryst)  TAKE ONE TABLET BY MOUTH WITH FOOD    [provider]  predniSONE (DELTASONE) 50 MG tablet Take 50 mg tablet 13 hours prior to CT scan, Take 50 mg tablet 7 hours prior to CT scan Take 50 mg tablet 1 hour prior to CT scan. Patient not taking: Reported on 01/16/2021 04/25/20   Melrose Nakayama, MD  predniSONE (DELTASONE) 50 MG tablet Take 50mg  prednisone on 3/1 @ 10:20pm.  Take 50mg  prednisone on 3/2 @ 4:20am.  Take 50mg  prednisone AND 50mg  benadryl on 3/2 @ 10:20am. Patient not taking: Reported on 01/16/2021 10/29/20   Logan Bores, MD  pregabalin (LYRICA) 75 MG capsule Take 75 mg by mouth 2 (two) times daily.    [provider]  QC NO DRIP NASAL RELIEF 0.05 % nasal spray  09/16/20   [provider]  Ranibizumab (LUCENTIS) 0.3 MG/0.05ML SOLN inject into eye once monthly Patient not taking: Reported on 01/16/2021 07/27/18   [provider]  rOPINIRole (REQUIP) 2 MG tablet Take 2 mg by mouth at bedtime. 11/26/19   [provider]  rosuvastatin (CRESTOR) 10 MG tablet Take 10 mg by mouth daily.    [provider]  saw palmetto 500 MG capsule Take 500 mg by mouth 2 (two) times daily.    [provider]  silver nitrate applicators 03-54 %  applicator Apply topically. 06/11/20   [provider]  tamsulosin (FLOMAX) 0.4 MG CAPS capsule Take 0.4 mg by mouth daily.    [provider]  traMADol (ULTRAM) 50 MG tablet Take 50 mg by mouth every 8 (eight) hours as needed. 01/22/21   [provider]  triamcinolone (NASACORT) 55 MCG/ACT AERO nasal inhaler 1 spray each nostril daily    [provider]  Vitamin D-Vitamin K (VITAMIN K2-VITAMIN D3) 45-2000 MCG-UNIT CAPS take 1 po bid 07/27/18   [provider]  warfarin (COUMADIN) 2 MG tablet Take 2 mg by mouth daily.    [provider]    Allergies    Iodinated diagnostic agents  Review of Systems   Review of Systems  Constitutional:  Negative for chills and fever.  HENT:  Positive for nosebleeds. Negative for ear pain and sore throat.   Eyes:  Negative for pain and visual disturbance.  Respiratory:  Negative for cough and shortness of breath.   Cardiovascular:  Negative for chest pain and palpitations.  Gastrointestinal:  Negative for abdominal pain and vomiting.  Genitourinary:  Negative for dysuria and hematuria.  Musculoskeletal:  Negative for arthralgias and back pain.  Skin:  Negative for color change and rash.  Neurological:  Negative for seizures and syncope.  All other systems reviewed and are negative.  Physical Exam Updated Vital Signs BP 135/72 (BP Location: Right Arm)   Pulse 76   Temp (!) 97.5 F (36.4 C) (Oral)   Resp 18   SpO2 99%   Physical Exam Vitals and nursing note reviewed.  Constitutional:      General: He is not in acute distress.    Appearance: He is well-developed. He is not ill-appearing or toxic-appearing.  HENT:     Head: Normocephalic and atraumatic.     Nose:     Comments: Bright red blood in bilateral nares, largest blood clot in the left naris.  Blood in the posterior oropharynx. Eyes:     Conjunctiva/sclera: Conjunctivae normal.  Cardiovascular:     Rate and Rhythm: Normal rate and  regular rhythm.  Heart sounds: No murmur heard. Pulmonary:     Effort: Pulmonary effort is normal. No respiratory distress.     Breath sounds: Normal breath sounds.  Abdominal:     Palpations: Abdomen is soft.     Tenderness: There is no abdominal tenderness.  Musculoskeletal:     Cervical back: Neck supple.  Skin:    General: Skin is warm and dry.  Neurological:     Mental Status: He is alert.   ED Results / Procedures / Treatments   Labs (all labs ordered are listed, but only abnormal results are displayed) Labs Reviewed  BASIC METABOLIC PANEL - Abnormal; Notable for the following components:      Result Value   Potassium 3.1 (*)    BUN 61 (*)    Creatinine, Ser 2.11 (*)    GFR, Estimated 29 (*)    All other components within normal limits  CBC WITH DIFFERENTIAL/PLATELET - Abnormal; Notable for the following components:   RBC 3.20 (*)    Hemoglobin 11.0 (*)    HCT 32.9 (*)    MCV 102.8 (*)    MCH 34.4 (*)    Platelets 111 (*)    All other components within normal limits  PROTIME-INR    EKG None  Radiology No results found.  Procedures Procedures   Medications Ordered in ED Medications  oxymetazoline (AFRIN) 0.05 % nasal spray 2 spray (2 sprays Each Nare Given 04/20/21 1508)  silver nitrate applicators applicator 1 Stick (1 Stick Topical Given 04/20/21 1525)    ED Course  I have reviewed the triage vital signs and the nursing notes.  Pertinent labs & imaging results that were available during my care of the patient were reviewed by me and considered in my medical decision making (see chart for details).    MDM Rules/Calculators/A&P                           87 year old male with recurrent epistaxis on anticoagulation presenting with epistaxis.  Vital signs reviewed, within acceptable limits.  Physical exam is notable for bilateral epistaxis.  Patient denies any symptoms of anemia, but will check his hemoglobin as the bleed has been going on for about 4  hours now.  At bedside, had the patient blow his nose and cleared a large clot from the left naris.  After this, applied Afrin and resumed to manual pressure.  We will recheck the patient in 20 minutes.  Manual pressure relieved.  No active bleeding noted at that time.  I rechecked 20 minutes later, patient still had no epistaxis noted.  Patient reports that he feels much better.  Hemoglobin stable at 11..  We will continue to monitor the patient without manual pressure to make sure his epistaxis does not recur.  After over an hour of no manual pressure, bleeding remained controlled and therefore I believe that the patient is stable for discharge in the emergency department.  He will be discharged to the care of his daughter.  Patient is comfortable with the plan.  Strict return precautions discussed and verbal and written format.  Final Clinical Impression(s) / ED Diagnoses Final diagnoses:  Epistaxis    Rx / DC Orders ED Discharge Orders     None        Claud Kelp, MD 04/20/21 1651    Dorie Rank, MD 04/20/21 2120    Dorie Rank, MD 04/20/21 2138

## 2021-04-20 NOTE — ED Notes (Signed)
Unable to obtain temp due to pt bleeding.

## 2021-04-20 NOTE — ED Notes (Signed)
Pressure device off of nares at this time, bleeding appears to have subsided. Pt expresses relief. VSS, no distress noted, resting in stretcher.

## 2021-04-20 NOTE — ED Triage Notes (Signed)
Pt to triage via [facility] from [place].  Reports increased nosebleeds over the past 2 weeks. Given Afrin.  Pt takes blood thinners.  Frequent nosebleeds.

## 2021-04-20 NOTE — ED Notes (Signed)
Pt nosebleed appears to have subsided. Pt verbalized understanding of protocol if bleeding resumes. RN sends pt home with bottle of afrin we used here in ED to stop bleeding. Pt verbalized understanding of all d/c instructions, meds, and followup care. Pt denies questions. VSS, no distress noted. W/C to front lobby where daughter picks pt up.

## 2021-04-29 ENCOUNTER — Encounter (HOSPITAL_COMMUNITY): Payer: Self-pay | Admitting: Emergency Medicine

## 2021-04-29 ENCOUNTER — Other Ambulatory Visit: Payer: Self-pay

## 2021-04-29 ENCOUNTER — Emergency Department (HOSPITAL_COMMUNITY)
Admission: EM | Admit: 2021-04-29 | Discharge: 2021-04-29 | Disposition: A | Discharge status: Home / Self Care | Payer: Medicare Other | Attending: Emergency Medicine | Admitting: Emergency Medicine

## 2021-04-29 ENCOUNTER — Telehealth: Payer: Self-pay | Admitting: Student

## 2021-04-29 DIAGNOSIS — Z87891 Personal history of nicotine dependence: Secondary | ICD-10-CM | POA: Insufficient documentation

## 2021-04-29 DIAGNOSIS — E039 Hypothyroidism, unspecified: Secondary | ICD-10-CM | POA: Diagnosis not present

## 2021-04-29 DIAGNOSIS — R040 Epistaxis: Secondary | ICD-10-CM | POA: Insufficient documentation

## 2021-04-29 DIAGNOSIS — J449 Chronic obstructive pulmonary disease, unspecified: Secondary | ICD-10-CM | POA: Diagnosis not present

## 2021-04-29 MED ORDER — CEPHALEXIN 500 MG PO CAPS: 500.0000 mg | ORAL_CAPSULE | Freq: Two times a day (BID) | ORAL | 0 refills | Status: DC

## 2021-04-29 MED ORDER — OXYMETAZOLINE HCL 0.05 % NA SOLN
1.0000 | Freq: Once | NASAL | Status: AC
  Administered 2021-04-29: 1 via NASAL
  Filled 2021-04-29: qty 30

## 2021-04-29 NOTE — ED Provider Notes (Addendum)
[facility] Emergency Department Provider Note MRN:  [id number]  Arrival date & time: 04/29/21     Chief Complaint   Epistaxis   History of Present Illness   Steven Williams is a 87 y.o. year-old male with a history of aortic aneurysm, antiphospholipid syndrome, A. fib presenting to the ED with chief complaint of nosebleed.  Location: Left nare Duration: 6 hours Onset: Gradual Timing: Constant Description: Slow ooze of red blood Severity: Mild Exacerbating/Alleviating Factors: None Associated Symptoms: None Pertinent Negatives: Denies any trauma, no cough or cold-like symptoms  Additional History: Went to ENT office yesterday and had some vessels cauterized.  Review of Systems  A complete 10 system review of systems was obtained and all systems are negative except as noted in the HPI and PMH.   Patient's Health History    Past Medical History:  Diagnosis Date   Anticoagulated 06/26/2020   Antiphospholipid antibody positive    Aortic aneurysm (HCC)    thoracic, Dr Roxan Hockey   Aortic dissection [facility])    BPH (benign prostatic hyperplasia)    Central retinal artery occlusion    Chronic anemia    Chronic renal insufficiency    COPD (chronic obstructive pulmonary disease) (HCC)    DDD (degenerative disc disease), cervical    DJD (degenerative joint disease)    DVT (deep venous thrombosis) (HCC)    Gastric dysmotility    GERD (gastroesophageal reflux disease)    Gout    HTN (hypertension)    Hyperlipidemia    Hypertensive retinopathy    OU   Hypothyroid    Macular degeneration    Myelodysplasia (myelodysplastic syndrome) (HCC)    Nasal septal deviation 06/26/2020   Paget's disease of bone    Peripheral neuropathy    Permanent atrial fibrillation (HCC)    Recurrent epistaxis 06/26/2020   Rheumatoid arthritis (HCC)    Symptomatic bradycardia    TIA (transient ischemic attack)     Past Surgical History:  Procedure Laterality Date    ATRIAL FIBRILLATION ABLATION  09/23/2014   Dr Encarnacion Chu in [place]     lumbar, x 2   CAROTID ENDARTERECTOMY Right 1990   CAROTID STENT     CATARACT EXTRACTION Bilateral 2010   [place]   EYE SURGERY Bilateral 2010   Cat Sx   LUNG DECORTICATION     VATS   PACEMAKER GENERATOR CHANGE  12/03/2011   MDT Adapta ADDR01 generator change by Dr Encarnacion Chu for symptomatic bradycardia   PACEMAKER IMPLANT  2004   MDT     Family History  Problem Relation Age of Onset   Cirrhosis Mother    Stroke Father    Lung cancer Sister    Lung cancer Brother     Social History   Socioeconomic History   Marital status: Widowed    Spouse name: Not on file   Number of children: 3   Years of education: Not on file   Highest education level: Bachelor's degree (e.g., BA, AB, BS)  Occupational History   Occupation: retired    Comment: retired [employer]  Tobacco Use   Smoking status: Former    Packs/day: 2.00    Years: 12.00    Pack years: 24.00    Types: Cigarettes    Quit date: 09/07/1962    Years since quitting: 58.6   Smokeless tobacco: Never  Vaping Use   Vaping Use: Never used  Substance and Sexual Activity   Alcohol use: Yes    Alcohol/week: 2.0  standard drinks    Types: 2 Glasses of wine per week    Comment: daily   Drug use: Never   Sexual activity: Not on file  Other Topics Concern   Not on file  Social History Narrative   Previously lived in [place]   03/12/20 Now lives in [place] ([place]   Retired Chief Financial Officer   Caffeine 2 a day   Social Determinants of Radio broadcast assistant Strain: Not on file  Food Insecurity: Not on file  Transportation Needs: Not on file  Physical Activity: Not on file  Stress: Not on file  Social Connections: Not on file  Intimate Partner Violence: Not on file     Physical Exam   Vitals:   04/29/21 0502 04/29/21 0615  BP:  128/67  Pulse:  60  Resp:  16  Temp: 97.6 F (36.4 C)   SpO2:  100%    CONSTITUTIONAL: Well-appearing,  NAD NEURO:  Alert and oriented x 3, no focal deficits EYES:  eyes equal and reactive ENT/NECK:  no LAD, no JVD; slow oozing of red blood from the left nare CARDIO: Regular rate, well-perfused, normal S1 and S2 PULM:  CTAB no wheezing or rhonchi GI/GU:  normal bowel sounds, non-distended, non-tender MSK/SPINE:  No gross deformities, no edema SKIN:  no rash, atraumatic PSYCH:  Appropriate speech and behavior  *Additional and/or pertinent findings included in MDM below  Diagnostic and Interventional Summary    EKG Interpretation  Date/Time:    Ventricular Rate:    PR Interval:    QRS Duration:   QT Interval:    QTC Calculation:   R Axis:     Text Interpretation:         Labs Reviewed - No data to display  No orders to display    Medications  oxymetazoline (AFRIN) 0.05 % nasal spray 1 spray (1 spray Each Nare Given 04/29/21 9833)     Procedures  /  Critical Care .Epistaxis Management  Date/Time: 04/29/2021 7:40 AM Performed by: Maudie Flakes, MD Authorized by: Maudie Flakes, MD   Consent:    Consent obtained:  Verbal   Consent given by:  Patient   Risks, benefits, and alternatives were discussed: yes     Risks discussed:  Bleeding, infection, nasal injury and pain   Alternatives discussed:  No treatment Universal protocol:    Procedure explained and questions answered to patient or proxy's satisfaction: yes     Immediately prior to procedure, a time out was called: yes     Patient identity confirmed:  Verbally with patient and arm band Anesthesia:    Anesthesia method:  None Procedure details:    Treatment site:  L anterior and L posterior   Treatment method:  Nasal tampon   Treatment complexity:  Limited   Treatment episode: recurring   Post-procedure details:    Assessment:  Bleeding decreased   Procedure completion:  Tolerated well, no immediate complications  ED Course and Medical Decision Making  I have reviewed the triage vital signs, the nursing  notes, and pertinent available records from the EMR.  Listed above are laboratory and imaging tests that I personally ordered, reviewed, and interpreted and then considered in my medical decision making (see below for details).  Mild epistaxis, anticoagulated, not responding to Afrin or pressure.  Applied [employer] nasal tampon.  Patient would like to avoid anything that needs to remain in place for several days.  Hoping we can fit him into the  office this morning given his recent office visit.  Signed out to oncoming provider at shift change.       Barth Kirks. Sedonia Small, MD [place] [email or web address]  Final Clinical Impressions(s) / ED Diagnoses     ICD-10-CM   1. Epistaxis  R04.0       ED Discharge Orders     None        Discharge Instructions Discussed with and Provided to Patient:   Discharge Instructions   None       Maudie Flakes, MD 04/29/21 4996    Maudie Flakes, MD 04/29/21 [phone]

## 2021-04-29 NOTE — Telephone Encounter (Signed)
Patient called and said that he has just gotten out of the ER due to severe nosebleeds. Been to ER 4 times in last month. Wants to know if  Eliquis should be stopped.

## 2021-04-29 NOTE — ED Provider Notes (Signed)
Signout from Dr. Sedonia Small.  87 year old male on thinners here with recurrent nosebleed.  He actually was in the ENT office yesterday and was cauterized.  Started again overnight.  No other complaints.  Patient hesitant to have any packing placed due to needing to attend a funeral this week.  Plan is to contact ENT when office opens this morning. Physical Exam  BP 129/61   Pulse 69   Temp 97.6 F (36.4 C) (Oral)   Resp 15   Ht 6\' 2"  (1.88 m)   Wt 85.3 kg   SpO2 98%   BMI 24.14 kg/m   Physical Exam  ED Course/Procedures     .Epistaxis Management  Date/Time: 04/29/2021 8:40 AM Performed by: Hayden Rasmussen, MD Authorized by: Hayden Rasmussen, MD   Consent:    Consent obtained:  Verbal   Consent given by:  Patient   Risks, benefits, and alternatives were discussed: yes     Risks discussed:  Bleeding and pain   Alternatives discussed:  Delayed treatment and referral Universal protocol:    Procedure explained and questions answered to patient or proxy's satisfaction: yes     Patient identity confirmed:  Verbally with patient Anesthesia:    Anesthesia method:  None Procedure details:    Treatment site:  L anterior   Treatment method:  Nasal balloon   Treatment complexity:  Extensive   Treatment episode: recurring   Post-procedure details:    Assessment:  Bleeding decreased   Procedure completion:  Tolerated well, no immediate complications  MDM  Discussed with Dr. Fredric Dine, ENT.  She said she cauterized anteriorly yesterday.  She would not recommend cauterizing again today so does not feel she has much to offer him other than a balloon packing in the office.  I reviewed this with the patient and he was okay with me putting in a balloon here in the department.  10 AM.  Patient is still having a little oozing but is much better controlled.  Patient is comfortable plan for discharge.  I recommended he follow-up in the office today if he continues to have problems per Dr. Fredric Dine  recommendations.  Return instructions discussed       Hayden Rasmussen, MD 04/29/21 [phone]

## 2021-04-29 NOTE — ED Notes (Signed)
Provider at bedside

## 2021-04-29 NOTE — Discharge Instructions (Addendum)
You are seen in the emergency department for recurrent nosebleed from your left nares.  A nasal balloon was placed that has slowed the bleeding.  I called Dr. Fredric Dine and she said that you can be seen in the office today if you are continuing to have problems.  Please drink plenty of fluids.  Please follow-up for balloon removal within 1 week at the ENT clinic.

## 2021-04-29 NOTE — ED Triage Notes (Signed)
Pt here by [facility] from [place] for nose bleed x 3 hours-pt on blood thinners; was given 2 sprays of Afrin en route; pt had blood vessels left cauterized 08/22

## 2021-04-29 NOTE — ED Notes (Signed)
No active bleeding at this time. Provider in to evaluate.

## 2021-04-29 NOTE — Telephone Encounter (Signed)
   [facility] ENT calling would requested to send high priority message about pt nose bleeds. She wants [place] to call pt as soon as possible

## 2021-04-29 NOTE — ED Notes (Signed)
Left nare dripping bright red blood. Scant amount. Provider aware. Nasal balloon in place. Patient to follow up with ENT this afternoon. Patient called daughter to pick him up.

## 2021-04-29 NOTE — ED Notes (Signed)
Nose bleed started around midnight. On blood thinners. Bleeding from lt nare

## 2021-04-29 NOTE — ED Notes (Signed)
Left nare began bleeding small amount of bright red blood again. Provider informed.

## 2021-04-29 NOTE — Telephone Encounter (Signed)
Patient stated he saw his ENT yesterday and went to the ER today for recurrent epistaxis. His ENT advised him to call us and see if he could hold his Eliquis for a few days to help it settle down.

## 2021-04-30 NOTE — Telephone Encounter (Signed)
Gave advisement to patient.  Discussed with Dr. Rayann Heman.   Steven Williams has a VERY high risk of thromboembolism given CHA2DS2-VASc of at least 6. He has history of stroke, DVT, and antiphospholipid antibody.   He should hold Eliquis at a maximum of 3-4 days if it is paired with some procedure (cauterization or packing by ENT)  or at a maximum of 4-5 days if no procedure planned.   Please schedule follow up with me next week. If he faired better on coumadin, may need to transition back to it with a modest INR goal.   [name]" [place], PA-C  04/30/2021 8:45 AM   Patient verbalized understanding. Patient wishes to call back later today to set up appointment for next week.

## 2021-05-05 NOTE — Telephone Encounter (Signed)
Pt aware and has call out to PCP Pt aware to call back if needs any other assistance Verbalized understanding ./cy

## 2021-05-05 NOTE — Telephone Encounter (Signed)
Follow Up:     Patient is returning Tina's call from today.

## 2021-05-05 NOTE — Telephone Encounter (Signed)
Spoke with pt and pt is wanting to go back on Coumadin due to several nosebleeds since starting the Eliquis per  pt has made 4 trips to ED to get stopped  Pt has also seen ENT  and was told to hold Eliquis for 5 days  Per pt PCP changed Warfarin to Eliquis and pt is going to contact Dr Brigitte Pulse about restarting Warfarin Appt made with [name] PA  for 05/14/21 at 9:20 pt could not come in tomorrow .Adonis Housekeeper

## 2021-05-05 NOTE — Telephone Encounter (Signed)
Left message to call back.  Needs to schedule appointment with Jonni Sanger this week.

## 2021-05-13 NOTE — Progress Notes (Signed)
[facility] Note  05/14/2021     CHIEF COMPLAINT Patient presents for Retina Follow Up   HISTORY OF PRESENT ILLNESS: Steven Williams is a 87 y.o. male who presents to the clinic today for:   HPI     Retina Follow Up   Patient presents with  Wet AMD.  In left eye.  Duration of 9 weeks.  Since onset it is stable.  I, the attending physician,  performed the HPI with the patient and updated documentation appropriately.        Comments   Pt here for 9 wk ret f/u for exu ARMD OS. Pt states vision is about the same, sometimes vision is fuzzier. No ocular pain or discomfort.       Last edited by [name], MD on 05/14/2021  9:15 PM.     pt states he feels like his vision is "fuzzy"  Referring physician:   HISTORICAL INFORMATION:   Selected notes from the [facility] Referred by [name], [place]   CURRENT MEDICATIONS: Current Outpatient Medications (Ophthalmic Drugs)  Medication Sig   Ranibizumab (LUCENTIS) 0.3 MG/0.05ML SOLN every 8 (eight) weeks.   No current facility-administered medications for this visit. (Ophthalmic Drugs)   Current Outpatient Medications (Other)  Medication Sig   acetaminophen (TYLENOL) 500 MG tablet Take 1,000 mg by mouth daily.   allopurinol (ZYLOPRIM) 100 MG tablet Take 100 mg by mouth daily.   carvedilol (COREG) 6.25 MG tablet Take 6.25 mg by mouth 2 (two) times daily with a meal.   Cholecalciferol (VITAMIN D3 PO) Take 1 tablet by mouth in the morning and at bedtime.   dicyclomine (BENTYL) 10 MG capsule Take 30 mg by mouth in the morning and at bedtime.   digoxin (LANOXIN) 0.125 MG tablet Take 0.125 mg by mouth every Monday, Wednesday, and Friday.   Ferrous Sulfate (IRON) 325 (65 Fe) MG TABS Take 1 tablet by mouth 2 (two) times daily.   furosemide (LASIX) 40 MG tablet Take 40 mg by mouth daily.   hydrocortisone (ANUSOL-HC) 2.5 % rectal cream Apply 1 application topically 2 (two) times daily as  needed for hemorrhoids or anal itching.   levothyroxine (SYNTHROID) 100 MCG tablet Take 100 mcg by mouth every morning.   Misc Natural Products (GLUCOSAMINE CHOND COMPLEX/MSM PO) Take 1 tablet by mouth daily.   Multiple Vitamins-Minerals (PRESERVISION AREDS PO) Take 1 tablet by mouth 2 (two) times daily.   oxymetazoline (AFRIN 12 HOUR) 0.05 % nasal spray Place 2 sprays into both nostrils 2 (two) times daily as needed for congestion.   pantoprazole (PROTONIX) 40 MG tablet Take 40 mg by mouth daily.   pregabalin (LYRICA) 75 MG capsule Take 75 mg by mouth at bedtime.   rOPINIRole (REQUIP) 2 MG tablet Take 2 mg by mouth at bedtime.   rosuvastatin (CRESTOR) 10 MG tablet Take 10 mg by mouth daily.   saw palmetto 500 MG capsule Take 500 mg by mouth 2 (two) times daily.   tamsulosin (FLOMAX) 0.4 MG CAPS capsule Take 0.4 mg by mouth daily.   warfarin (COUMADIN) 2 MG tablet Take by mouth daily.   Current Facility-Administered Medications (Other)  Medication Route   Bevacizumab (AVASTIN) SOLN 1.25 mg Intravitreal   diphenhydrAMINE (BENADRYL) capsule 50 mg Oral    REVIEW OF SYSTEMS: ROS   Positive for: Genitourinary, Musculoskeletal, Endocrine, Cardiovascular, Eyes, Heme/Lymph Negative for: Constitutional, Gastrointestinal, Neurological, Skin, HENT, Respiratory, Psychiatric, Allergic/Imm Last edited by [name], COT  on 05/14/2021  1:23 PM.      ALLERGIES Allergies  Allergen Reactions   Iodinated Diagnostic Agents Rash    Other reaction(s): rash    PAST MEDICAL HISTORY Past Medical History:  Diagnosis Date   Anticoagulated 06/26/2020   Antiphospholipid antibody positive    Aortic aneurysm (HCC)    thoracic, Dr Roxan Hockey   Aortic dissection [facility])    BPH (benign prostatic hyperplasia)    Central retinal artery occlusion    Chronic anemia    Chronic renal insufficiency    COPD (chronic obstructive pulmonary disease) (HCC)    DDD (degenerative disc disease), cervical    DJD  (degenerative joint disease)    DVT (deep venous thrombosis) (HCC)    Gastric dysmotility    GERD (gastroesophageal reflux disease)    Gout    HTN (hypertension)    Hyperlipidemia    Hypertensive retinopathy    OU   Hypothyroid    Macular degeneration    Myelodysplasia (myelodysplastic syndrome) ([place])    Nasal septal deviation 06/26/2020   Paget's disease of bone    Peripheral neuropathy    Permanent atrial fibrillation (HCC)    Recurrent epistaxis 06/26/2020   Rheumatoid arthritis (HCC)    Symptomatic bradycardia    TIA (transient ischemic attack)    Past Surgical History:  Procedure Laterality Date   ATRIAL FIBRILLATION ABLATION  09/23/2014   Dr Encarnacion Chu in [place]     lumbar, x 2   CAROTID ENDARTERECTOMY Right 1990   CAROTID STENT     CATARACT EXTRACTION Bilateral 2010   [place]   EYE SURGERY Bilateral 2010   [facility]   LUNG DECORTICATION     [facility]   PACEMAKER GENERATOR CHANGE  12/03/2011   [facility] Adapta ADDR01 generator change by Dr Encarnacion Chu for symptomatic bradycardia   PACEMAKER IMPLANT  2004   [facility]T     FAMILY HISTORY Family History  Problem Relation Age of Onset   Cirrhosis Mother    Stroke Father    Lung cancer Sister    Lung cancer Brother     SOCIAL HISTORY Social History   Tobacco Use   Smoking status: Former    Packs/day: 2.00    Years: 12.00    Pack years: 24.00    Types: Cigarettes    Quit date: 09/07/1962    Years since quitting: 58.7   Smokeless tobacco: Never  Vaping Use   Vaping Use: Never used  Substance Use Topics   Alcohol use: Yes    Alcohol/week: 2.0 standard drinks    Types: 2 Glasses of wine per week    Comment: daily   Drug use: Never         OPHTHALMIC EXAM:  Base Eye Exam     Visual Acuity (Snellen - Linear)       Right Left   Dist cc CF at 3' 20/60 +1   Dist ph cc  NI         Tonometry (Tonopen, 1:37 PM)       Right Left   Pressure 8 9         Pupils       Dark Light Shape React APD    Right 2 1 Round Sluggish None   Left 2 1 Round Sluggish None         Visual Fields (Counting fingers)       Left Right    Full Full  Extraocular Movement       Right Left    Full, Ortho Full, Ortho         Neuro/Psych     Oriented x3: Yes   Mood/Affect: Normal         Dilation     Both eyes: 1.0% Mydriacyl, 2.5% Phenylephrine @ 1:38 PM           Slit Lamp and Fundus Exam     Slit Lamp Exam       Right Left   Lids/Lashes Dermatochalasis - upper lid, Meibomian gland dysfunction Dermatochalasis - upper lid, Meibomian gland dysfunction   Conjunctiva/Sclera White and quiet White and quiet   Cornea Arcus, 1+Punctate epithelial erosions, Well healed cataract wounds Arcus, 1-2+inferior Punctate epithelial erosions centrally, Well healed cataract wounds, mild endo pigment   Anterior Chamber Deep and quiet Deep and quiet   Iris Round and moderately dilated Round and moderately dilated   Lens Posterior chamber intraocular lens, 1+ non-central Posterior capsular opacification Posterior chamber intraocular lens, open PC   Vitreous Vitreous syneresis, Posterior vitreous detachment Vitreous syneresis         Fundus Exam       Right Left   Disc +cupping, trace pallor, thin superior rim, 360 PPA mild pallor, 360 PPA, +cupping   C/D Ratio 0.7 0.7   Macula Flat, diffuse RPE atrophy, central pigment clumping, No heme or edema Flat, Blunted foveal reflex, Drusen, RPE mottling, clumping and atrophy, focal IRH ST macula - improved, No edema, trace cystic changes -- stably improved, peripapillary GA nasal macula   Vessels Vascular attenuation Vascular attenuation, Tortuous   Periphery Attached, mild reticular degeneration, mild paving stone inferiorly Attached, mild reticular degeneration           Refraction     Wearing Rx       Sphere Cylinder Axis Add   Right -1.75 +2.25 005 +2.50   Left -1.50 +2.50 175 +2.50    Type: PAL            IMAGING AND  PROCEDURES  Imaging and Procedures for '@TODAY' @  Basic metabolic panel      Component Value Flag Ref Range Units Status   Glucose 99      65 - 99 mg/dL Final   BUN 39      8 - 27 mg/dL Final   Creatinine, Ser 1.70      0.76 - 1.27 mg/dL Final   eGFR 38      >59 mL/min/1.73 Final   BUN/Creatinine Ratio 23      10 - 24  Final   Sodium 139      134 - 144 mmol/L Final   Potassium 4.6      3.5 - 5.2 mmol/L Final   Chloride 103      96 - 106 mmol/L Final   CO2 20      20 - 29 mmol/L Final   Calcium 9.2      8.6 - 10.2 mg/dL Final           CBC      Component Value Flag Ref Range Units Status   WBC 3.8      3.4 - 10.8 x10E3/uL Final   RBC 2.85      4.14 - 5.80 x10E6/uL Final   Hemoglobin 9.6      13.0 - 17.7 g/dL Final   Hematocrit 27.8      37.5 - 51.0 % Final   MCV 98  79 - 97 fL Final   MCH 33.7      26.6 - 33.0 pg Final   MCHC 34.5      31.5 - 35.7 g/dL Final   RDW 12.2      11.6 - 15.4 % Final   Platelets 121      150 - 450 x10E3/uL Final           CUP PACEART INCLINIC DEVICE CHECK      Component Value Flag Ref Range Units Status   Date Time Interrogation Session [id number]        Final   Pulse Generator Manufacturer MERM        Final   Pulse Gen Model ADDR01 Adapta        Final   Pulse Gen Serial Number [id number]        Final   Clinic Name [facility]        Final   Implantable Pulse Generator Type Implantable Pulse Generator        Final   Implantable Pulse Generator Implant Date [id number]        Final   Implantable Lead Manufacturer [facility]        Final   Implantable Lead Model 5076 CapSureFix Novus        Final   Implantable Lead Serial Number [id number]        Final   Implantable Lead Implant Date [id number]        Final   Implantable Lead Location Detail 1 UNKNOWN        Final   Implantable Lead Location [id number]        Final   Implantable Lead Manufacturer [facility]        Final   Implantable Lead Model 5076 CapSureFix Novus        Final   Implantable  Lead Serial Number [id number]        Final   Implantable Lead Implant Date [id number]        Final   Implantable Lead Location Detail 1 UNKNOWN        Final   Implantable Lead Location [id number]        Final   Lead Channel Setting Sensing Sensitivity 2.80       mV Final   Lead Channel Setting Pacing Pulse Width 1.00       ms Final   Lead Channel Setting Pacing Amplitude 2.500       V Final   Lead Channel Impedance Value 67       ohm Final   Lead Channel Impedance Value 835       ohm Final   Lead Channel Sensing Intrinsic Amplitude 8.00       mV Final   Lead Channel Pacing Threshold Amplitude 1.250       V Final   Lead Channel Pacing Threshold Pulse Width 1.00       ms Final   Battery Status OK        Final   Battery Remaining Longevity 25       mo Final   Battery Voltage 2.73       V Final   Battery Impedance 2,892       ohm Final   Brady Statistic RV Percent Paced 30       % Final   Eval Rhythm VS 60s        Final         Linked Images  Digoxin level      Component   Digoxin, Serum   WILL FOLLOW             OCT, Retina - OU - Both Eyes       Right Eye Quality was good. Central Foveal Thickness: 206. Progression has been stable. Findings include abnormal foveal contour, no IRF, no SRF, subretinal hyper-reflective material, retinal drusen , outer retinal atrophy, pigment epithelial detachment.   Left Eye Quality was good. Central Foveal Thickness: 248. Progression has been stable. Findings include abnormal foveal contour, no SRF, outer retinal atrophy, retinal drusen , pigment epithelial detachment, subretinal hyper-reflective material, no IRF (Stable improvement in cystic changes, persistent GA).   Notes *Images captured and stored on drive  Diagnosis / Impression:  Exudative ARMD OU OD: +atrophic macular scar - stable OS: Stable improvement in cystic changes, persistent GA  Clinical management:  See below  Abbreviations: NFP - Normal foveal  profile. CME - cystoid macular edema. PED - pigment epithelial detachment. IRF - intraretinal fluid. SRF - subretinal fluid. EZ - ellipsoid zone. ERM - epiretinal membrane. ORA - outer retinal atrophy. ORT - outer retinal tubulation. SRHM - subretinal hyper-reflective material      Intravitreal Injection, Pharmacologic Agent - OS - Left Eye       Time Out 05/14/2021. 2:11 PM. Confirmed correct patient, procedure, site, and patient consented.   Anesthesia Topical anesthesia was used. Anesthetic medications included Lidocaine 2%, Proparacaine 0.5%.   Procedure Preparation included 5% betadine to ocular surface, eyelid speculum. A (32g) needle was used.   Injection: 2 mg aflibercept 2 MG/0.05ML   Route: Intravitreal, Site: Left Eye   NDC: [id number], Lot: [id number], Expiration date: 03/06/2022, Waste: 0.05 mL   Post-op Post injection exam found visual acuity of at least counting fingers. The patient tolerated the procedure well. There were no complications. The patient received written and verbal post procedure care education. Post injection medications were not given.            ASSESSMENT/PLAN:    ICD-10-CM   1. Exudative age-related macular degeneration of left eye with active choroidal neovascularization (HCC)  H35.3221 Intravitreal Injection, Pharmacologic Agent - OS - Left Eye    aflibercept (EYLEA) SOLN 2 mg    2. Exudative age-related macular degeneration of right eye with inactive scar ([place])  H35.3213     3. Retinal edema  H35.81 OCT, Retina - OU - Both Eyes    4. Essential hypertension  I10     5. Hypertensive retinopathy of both eyes  H35.033     6. Pseudophakia of both eyes  Z96.1      1-3. 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    OD- w/ inactive scar -- history of low vision, CF 3'  OS- w/ CNV   - history of multiple IVL OS in [place], [place] w/ Dr. Doristine Church -- last injection 06/2018, q4-6 wk interval  - moved to [place] in November 2019 and  has been followed by Dr. Baird Cancer who had recommended observation, thus pt sought second opinion  - given functional monocular status and history of active CNV, has been receiving maintenance injections, s/p IVA #1 OS (02.25.20), #2 (04.07.20), #3 (05.05.20), #4 (06.22.20), #5 (08.03.20), #6 (03.22.21)  - s/p IVE OS #1 (09.14.20), #2 (10.23.20), #3 (11.30.20), #4 (01.11.21), #5 (02.15.21), #6 (03.22.21), #7 (05.03.21), #8 (06.16.21), #9 (07.26.21), #10 (09.07.21), #11 (10.19.21), #12 (12.01.2021), #13 (01.19.22), #14 (03.09.22), #15 (05.12.22), #16 (07.08.22)  - OCT shows PEDs,  CNV w/ stable improvement in cystic changes OS; OD with inactive scar  - BCVA decreased to 20/60 from 20/50 OS   - discussed likely progression of dry ARMD OS causing decline in [place]  - recommend IVE #17 OS today, 09.07.22, maintenance w/ f/u 9 wks (slow tx and ext)  - pt prefers aggressive maintenance given functional monocular stutus  - pt wishes to be treated with IVE OS  - RBA of procedure discussed, questions answered  - informed consent obtained   - Eylea informed consent form signed and scanned on 01.11.2021  - see procedure note  - Eylea4U benefits investigation started 9.14.20 -- approved for 2021  - f/u in 9 wks -- DFE, OCT, likely injection  4,5. Hypertensive retinopathy OU  - discussed importance of tight BP control  - monitor   6. Pseudophakia OU  - s/p CE/IOL OU  - monitor   Ophthalmic Meds Ordered this visit:  Meds ordered this encounter  Medications   aflibercept (EYLEA) SOLN 2 mg      Return in about 9 weeks (around 07/16/2021) for f/u exu ARMD OU, DFE, OCT.  There are no Patient Instructions on file for this visit.  This document serves as a record of services personally performed by Gardiner Sleeper, MD, PhD. It was created on their behalf by Orvan Falconer, an ophthalmic technician. The creation of this record is the provider's dictation and/or activities during the visit.     Electronically signed by: Orvan Falconer, OA, 05/14/21  9:18 PM   Gardiner Sleeper, M.D., Ph.D. Diseases & Surgery of the Retina and [place] 05/14/2021   I have reviewed the above documentation for accuracy and completeness, and I agree with the above. Gardiner Sleeper, M.D., Ph.D. 05/14/21 9:18 PM   Abbreviations: M myopia (nearsighted); A astigmatism; H hyperopia (farsighted); P presbyopia; Mrx spectacle prescription;  CTL contact lenses; OD right eye; OS left eye; OU both eyes  XT exotropia; ET esotropia; PEK punctate epithelial keratitis; PEE punctate epithelial erosions; DES dry eye syndrome; MGD meibomian gland dysfunction; ATs artificial tears; PFAT's preservative free artificial tears; [place] nuclear sclerotic cataract; PSC posterior subcapsular cataract; ERM epi-retinal membrane; PVD posterior vitreous detachment; RD retinal detachment; DM diabetes mellitus; DR diabetic retinopathy; NPDR non-proliferative diabetic retinopathy; PDR proliferative diabetic retinopathy; CSME clinically significant macular edema; DME diabetic macular edema; dbh dot blot hemorrhages; CWS cotton wool spot; POAG primary open angle glaucoma; C/D cup-to-disc ratio; HVF humphrey visual field; GVF goldmann visual field; OCT optical coherence tomography; IOP intraocular pressure; BRVO Branch retinal vein occlusion; CRVO central retinal vein occlusion; CRAO central retinal artery occlusion; BRAO branch retinal artery occlusion; RT retinal tear; SB scleral buckle; PPV pars plana vitrectomy; VH Vitreous hemorrhage; PRP panretinal laser photocoagulation; IVK intravitreal kenalog; VMT vitreomacular traction; MH Macular hole;  NVD neovascularization of the disc; NVE neovascularization elsewhere; AREDS age related eye disease study; ARMD age related macular degeneration; POAG primary open angle glaucoma; EBMD epithelial/anterior basement membrane dystrophy; ACIOL anterior chamber intraocular  lens; IOL intraocular lens; PCIOL posterior chamber intraocular lens; Phaco/IOL phacoemulsification with intraocular lens placement; [place] photorefractive keratectomy; LASIK laser assisted in situ keratomileusis; HTN hypertension; DM diabetes mellitus; COPD chronic obstructive pulmonary disease

## 2021-05-13 NOTE — Progress Notes (Signed)
PCP:  Ginger Organ., MD Primary Cardiologist: None Electrophysiologist: Thompson Grayer, MD   Steven Williams is a 87 y.o. male seen today for Thompson Grayer, MD for routine electrophysiology followup.  Since last being seen in our clinic the patient has struggled with frequent nose bleeds.   He was on coumadin last I saw him, and attempted to make switch to Eliquis. He had multiple ED and ENT visits with nosebleeds on Eliquis, ultimately requesting he go back on coumadin, which has previously been managed by Dr. Brigitte Pulse.   Today he is doing well. He is back on coumadin, INR per PCP. He has not had any further nose bleeds in approx 3 weeks. (Since holding eliquis and transitioning back to coumadin. He has intermittent short episodes of SOB, sometimes at rest. Otherwise he is able to do his ADLs without difficulty.   Past Medical History:  Diagnosis Date   Anticoagulated 06/26/2020   Antiphospholipid antibody positive    Aortic aneurysm (HCC)    thoracic, Dr Roxan Hockey   Aortic dissection [facility])    BPH (benign prostatic hyperplasia)    Central retinal artery occlusion    Chronic anemia    Chronic renal insufficiency    COPD (chronic obstructive pulmonary disease) (HCC)    DDD (degenerative disc disease), cervical    DJD (degenerative joint disease)    DVT (deep venous thrombosis) (HCC)    Gastric dysmotility    GERD (gastroesophageal reflux disease)    Gout    HTN (hypertension)    Hyperlipidemia    Hypertensive retinopathy    OU   Hypothyroid    Macular degeneration    Myelodysplasia (myelodysplastic syndrome) ([place])    Nasal septal deviation 06/26/2020   Paget's disease of bone    Peripheral neuropathy    Permanent atrial fibrillation (HCC)    Recurrent epistaxis 06/26/2020   Rheumatoid arthritis (HCC)    Symptomatic bradycardia    TIA (transient ischemic attack)    Past Surgical History:  Procedure Laterality Date   ATRIAL FIBRILLATION ABLATION  09/23/2014   Dr  Encarnacion Chu in [place]     lumbar, x 2   CAROTID ENDARTERECTOMY Right 1990   CAROTID STENT     CATARACT EXTRACTION Bilateral 2010   [place]   EYE SURGERY Bilateral 2010   Cat Sx   LUNG DECORTICATION     VATS   PACEMAKER GENERATOR CHANGE  12/03/2011   MDT [facility] ADDR01 generator change by Dr Encarnacion Chu for symptomatic bradycardia   PACEMAKER IMPLANT  2004   MDT     Current Outpatient Medications  Medication Sig Dispense Refill   acetaminophen (TYLENOL) 500 MG tablet Take 1,000 mg by mouth daily.     allopurinol (ZYLOPRIM) 100 MG tablet Take 100 mg by mouth daily.     carvedilol (COREG) 6.25 MG tablet Take 6.25 mg by mouth 2 (two) times daily with a meal.     cephALEXin (KEFLEX) 500 MG capsule Take 1 capsule (500 mg total) by mouth 2 (two) times daily. 10 capsule 0   Cholecalciferol (VITAMIN D3 PO) Take 1 tablet by mouth in the morning and at bedtime.     dicyclomine (BENTYL) 10 MG capsule Take 30 mg by mouth in the morning and at bedtime.     digoxin (LANOXIN) 0.125 MG tablet Take 0.125 mg by mouth every Monday, Wednesday, and Friday.     ELIQUIS 2.5 MG TABS tablet Take 2.5 mg by mouth 2 (two) times daily.  Ferrous Sulfate (IRON) 325 (65 Fe) MG TABS Take 1 tablet by mouth 2 (two) times daily.     furosemide (LASIX) 40 MG tablet Take 40 mg by mouth daily.     hydrocortisone (ANUSOL-HC) 2.5 % rectal cream Apply 1 application topically 2 (two) times daily as needed for hemorrhoids or anal itching.     levothyroxine (SYNTHROID) 100 MCG tablet Take 100 mcg by mouth every morning.     Misc Natural Products (GLUCOSAMINE CHOND COMPLEX/MSM PO) Take 1 tablet by mouth daily.     Multiple Vitamins-Minerals (PRESERVISION AREDS PO) Take 1 tablet by mouth 2 (two) times daily.     NON FORMULARY Apply 1 application topically in the morning and at bedtime. Antifungal nail cream from [place] - faxed 03/27/2019  [facility]     oxymetazoline (AFRIN 12 HOUR) 0.05 % nasal spray Place 2  sprays into both nostrils 2 (two) times daily as needed for congestion.     pantoprazole (PROTONIX) 40 MG tablet Take 40 mg by mouth daily.     potassium chloride SA (KLOR-CON) 20 MEQ tablet Take 20 mEq by mouth daily.     predniSONE (DELTASONE) 50 MG tablet Take 50 mg tablet 13 hours prior to CT scan, Take 50 mg tablet 7 hours prior to CT scan Take 50 mg tablet 1 hour prior to CT scan. (Patient not taking: No sig reported) 3 tablet 0   predniSONE (DELTASONE) 50 MG tablet Take 50mg  prednisone on 3/1 @ 10:20pm.  Take 50mg  prednisone on 3/2 @ 4:20am.  Take 50mg  prednisone AND 50mg  benadryl on 3/2 @ 10:20am. (Patient not taking: No sig reported) 3 tablet 0   pregabalin (LYRICA) 75 MG capsule Take 75 mg by mouth at bedtime.     Ranibizumab (LUCENTIS) 0.3 MG/0.05ML SOLN      rOPINIRole (REQUIP) 2 MG tablet Take 2 mg by mouth at bedtime.     rosuvastatin (CRESTOR) 10 MG tablet Take 10 mg by mouth daily.     saw palmetto 500 MG capsule Take 500 mg by mouth 2 (two) times daily.     tamsulosin (FLOMAX) 0.4 MG CAPS capsule Take 0.4 mg by mouth daily.     White Petrolatum-Mineral Oil (ARTIFICIAL EYE OP) Place 1 drop into both eyes daily as needed (dry eyes).     Current Facility-Administered Medications  Medication Dose Route Frequency Provider Last Rate Last Admin   Bevacizumab (AVASTIN) SOLN 1.25 mg  1.25 mg Intravitreal  [name], MD   1.25 mg at 11/01/18 2343   diphenhydrAMINE (BENADRYL) capsule 50 mg  50 mg Oral Once Logan Bores, MD        Allergies  Allergen Reactions   Iodinated Diagnostic Agents Rash    Other reaction(s): rash    Social History   Socioeconomic History   Marital status: Widowed    Spouse name: Not on file   Number of children: 3   Years of education: Not on file   Highest education level: Bachelor's degree (e.g., BA, AB, BS)  Occupational History   Occupation: retired    Comment: retired ME  Tobacco Use   Smoking status: Former    Packs/day: 2.00    Years:  12.00    Pack years: 24.00    Types: Cigarettes    Quit date: 09/07/1962    Years since quitting: 58.7   Smokeless tobacco: Never  Vaping Use   Vaping Use: Never used  Substance and Sexual Activity   Alcohol use: Yes  Alcohol/week: 2.0 standard drinks    Types: 2 Glasses of wine per week    Comment: daily   Drug use: Never   Sexual activity: Not on file  Other Topics Concern   Not on file  Social History Narrative   Previously lived in [place]   03/12/20 Now lives in [place]   Retired Chief Financial Officer   Caffeine 2 a day   Social Determinants of Radio broadcast assistant Strain: Not on file  Food Insecurity: Not on file  Transportation Needs: Not on file  Physical Activity: Not on file  Stress: Not on file  Social Connections: Not on file  Intimate Partner Violence: Not on file     Review of Systems: General: No chills, fever, night sweats or weight changes  Cardiovascular:  No chest pain, dyspnea on exertion, edema, orthopnea, palpitations, paroxysmal nocturnal dyspnea Dermatological: No rash, lesions or masses Respiratory: No cough, dyspnea Urologic: No hematuria, dysuria Abdominal: No nausea, vomiting, diarrhea, bright red blood per rectum, melena, or hematemesis Neurologic: No visual changes, weakness, changes in mental status All other systems reviewed and are otherwise negative except as noted above.  Physical Exam: There were no vitals filed for this visit.  GEN- The patient is well appearing, alert and oriented x 3 today.   HEENT: normocephalic, atraumatic; sclera clear, conjunctiva pink; hearing intact; oropharynx clear; neck supple, no JVP Lymph- no cervical lymphadenopathy Lungs- Clear to ausculation bilaterally, normal work of breathing.  No wheezes, rales, rhonchi Heart- Regular rate and rhythm, no murmurs, rubs or gallops, PMI not laterally displaced GI- soft, non-tender, non-distended, bowel sounds present, no  hepatosplenomegaly Extremities- no clubbing, cyanosis, or edema; DP/PT/radial pulses 2+ bilaterally MS- no significant deformity or atrophy Skin- warm and dry, no rash or lesion Psych- euthymic mood, full affect Neuro- strength and sensation are intact  EKG is not ordered.   Additional studies reviewed include: Previous EP office notes.   Assessment and Plan:  1. Symptomatic bradycardia s/p Medtronic PPM  Normal PPM function by recent visit and remote.  Device not checked today.    2. Permanent Afib Rates well controlled overall.  Continue coumadin for CHA2DS2VASC of at least 6   Continue digoxin.    3. HTN Stable on current regimen   4. High risk drug monitoring Digoxin level check today.   5. Chronic diastolic CHF LVEF 23-30% 0/7622 Volume status stable on exam today. .  Continue lasix 40 mg every other day. Discussed sliding scale diuretics, sodium restriction, fluid restriction. He should take 20 mg lasix EXTRA as needed for weight gain of 3 lbs overnight, or 5 lbs within one week.  BMET today. Watch sodium.   6. Epistaxis Resolved off Eliquis. PCP following INR.   RTC 6 months. Sooner with issues.   Shirley Friar, PA-C  05/13/21 9:48 AM

## 2021-05-14 ENCOUNTER — Encounter (INDEPENDENT_AMBULATORY_CARE_PROVIDER_SITE_OTHER): Payer: Self-pay | Admitting: Ophthalmology

## 2021-05-14 ENCOUNTER — Other Ambulatory Visit: Payer: Self-pay

## 2021-05-14 ENCOUNTER — Ambulatory Visit (INDEPENDENT_AMBULATORY_CARE_PROVIDER_SITE_OTHER): Payer: Medicare Other | Admitting: Ophthalmology

## 2021-05-14 ENCOUNTER — Ambulatory Visit (INDEPENDENT_AMBULATORY_CARE_PROVIDER_SITE_OTHER): Payer: Medicare Other | Admitting: Student

## 2021-05-14 ENCOUNTER — Encounter: Payer: Self-pay | Admitting: Student

## 2021-05-14 VITALS — BP 116/62 | HR 65 | Ht 74.0 in | Wt 190.6 lb

## 2021-05-14 DIAGNOSIS — H353213 Exudative age-related macular degeneration, right eye, with inactive scar: Secondary | ICD-10-CM | POA: Diagnosis not present

## 2021-05-14 DIAGNOSIS — H35033 Hypertensive retinopathy, bilateral: Secondary | ICD-10-CM

## 2021-05-14 DIAGNOSIS — I10 Essential (primary) hypertension: Secondary | ICD-10-CM | POA: Diagnosis not present

## 2021-05-14 DIAGNOSIS — I4821 Permanent atrial fibrillation: Secondary | ICD-10-CM

## 2021-05-14 DIAGNOSIS — H3581 Retinal edema: Secondary | ICD-10-CM

## 2021-05-14 DIAGNOSIS — I5032 Chronic diastolic (congestive) heart failure: Secondary | ICD-10-CM | POA: Diagnosis not present

## 2021-05-14 DIAGNOSIS — R001 Bradycardia, unspecified: Secondary | ICD-10-CM

## 2021-05-14 DIAGNOSIS — H353221 Exudative age-related macular degeneration, left eye, with active choroidal neovascularization: Secondary | ICD-10-CM | POA: Diagnosis not present

## 2021-05-14 DIAGNOSIS — Z961 Presence of intraocular lens: Secondary | ICD-10-CM

## 2021-05-14 LAB — CUP PACEART INCLINIC DEVICE CHECK
Battery Impedance: 2892 Ohm
Battery Remaining Longevity: 25 mo
Battery Voltage: 2.73 V
Brady Statistic RV Percent Paced: 30 %
Date Time Interrogation Session: 20220907095451
Implantable Lead Implant Date: 20041008
Implantable Lead Implant Date: 20041008
Implantable Lead Location: 753859
Implantable Lead Location: 753860
Implantable Lead Model: 5076
Implantable Lead Model: 5076
Implantable Pulse Generator Implant Date: 20130328
Lead Channel Impedance Value: 67 Ohm
Lead Channel Impedance Value: 835 Ohm
Lead Channel Pacing Threshold Amplitude: 1.25 V
Lead Channel Pacing Threshold Pulse Width: 1 ms
Lead Channel Sensing Intrinsic Amplitude: 8 mV
Lead Channel Setting Pacing Amplitude: 2.5 V
Lead Channel Setting Pacing Pulse Width: 1 ms
Lead Channel Setting Sensing Sensitivity: 2.8 mV

## 2021-05-14 MED ORDER — AFLIBERCEPT 2MG/0.05ML IZ SOLN FOR KALEIDOSCOPE
2.0000 mg | INTRAVITREAL | Status: AC | PRN
  Administered 2021-05-14: 2 mg via INTRAVITREAL

## 2021-05-14 NOTE — Patient Instructions (Signed)
Medication Instructions:  Your physician recommends that you continue on your current medications as directed. Please refer to the Current Medication list given to you today.  *If you need a refill on your cardiac medications before your next appointment, please call your pharmacy*   Lab Work: TODAY: BMET, CBC, Digoxin level  If you have labs (blood work) drawn today and your tests are completely normal, you will receive your results only by: [place] (if you have MyChart) OR A paper copy in the mail If you have any lab test that is abnormal or we need to change your treatment, we will call you to review the results.   Follow-Up: At [facility], you and your health needs are our priority.  As part of our continuing mission to provide you with exceptional heart care, we have created designated Provider Care Teams.  These Care Teams include your primary Cardiologist (physician) and Advanced Practice Providers (APPs -  Physician Assistants and Nurse Practitioners) who all work together to provide you with the care you need, when you need it.  We recommend signing up for the patient portal called "MyChart".  Sign up information is provided on this After Visit Summary.  MyChart is used to connect with patients for Virtual Visits (Telemedicine).  Patients are able to view lab/test results, encounter notes, upcoming appointments, etc.  Non-urgent messages can be sent to your provider as well.   To learn more about what you can do with MyChart, go to [email or web address].    Your next appointment:   6 month(s)  The format for your next appointment:   In Person  Provider:   Legrand Como "[name], PA-C

## 2021-05-15 ENCOUNTER — Ambulatory Visit
Admission: RE | Admit: 2021-05-15 | Discharge: 2021-05-15 | Disposition: A | Discharge status: Home / Self Care | Payer: Medicare Other | Source: Ambulatory Visit | Attending: Thoracic Surgery (Cardiothoracic Vascular Surgery) | Admitting: Thoracic Surgery (Cardiothoracic Vascular Surgery)

## 2021-05-15 DIAGNOSIS — I712 Thoracic aortic aneurysm, without rupture, unspecified: Secondary | ICD-10-CM

## 2021-05-15 LAB — CBC
Hematocrit: 27.8 % — ABNORMAL LOW (ref 37.5–51.0)
Hemoglobin: 9.6 g/dL — ABNORMAL LOW (ref 13.0–17.7)
MCH: 33.7 pg — ABNORMAL HIGH (ref 26.6–33.0)
MCHC: 34.5 g/dL (ref 31.5–35.7)
MCV: 98 fL — ABNORMAL HIGH (ref 79–97)
Platelets: 121 10*3/uL — ABNORMAL LOW (ref 150–450)
RBC: 2.85 x10E6/uL — ABNORMAL LOW (ref 4.14–5.80)
RDW: 12.2 % (ref 11.6–15.4)
WBC: 3.8 10*3/uL (ref 3.4–10.8)

## 2021-05-15 LAB — DIGOXIN LEVEL: Digoxin, Serum: 0.6 ng/mL (ref 0.5–0.9)

## 2021-05-15 LAB — BASIC METABOLIC PANEL
BUN/Creatinine Ratio: 23 (ref 10–24)
BUN: 39 mg/dL — ABNORMAL HIGH (ref 8–27)
CO2: 20 mmol/L (ref 20–29)
Calcium: 9.2 mg/dL (ref 8.6–10.2)
Chloride: 103 mmol/L (ref 96–106)
Creatinine, Ser: 1.7 mg/dL — ABNORMAL HIGH (ref 0.76–1.27)
Glucose: 99 mg/dL (ref 65–99)
Potassium: 4.6 mmol/L (ref 3.5–5.2)
Sodium: 139 mmol/L (ref 134–144)
eGFR: 38 mL/min/{1.73_m2} — ABNORMAL LOW (ref >59)

## 2021-05-20 ENCOUNTER — Ambulatory Visit (INDEPENDENT_AMBULATORY_CARE_PROVIDER_SITE_OTHER): Payer: Medicare Other | Admitting: Physician Assistant

## 2021-05-20 ENCOUNTER — Other Ambulatory Visit: Payer: Self-pay

## 2021-05-20 VITALS — BP 91/54 | HR 66 | Resp 20 | Ht 74.0 in | Wt 187.0 lb

## 2021-05-20 DIAGNOSIS — I712 Thoracic aortic aneurysm, without rupture, unspecified: Secondary | ICD-10-CM

## 2021-05-20 NOTE — Patient Instructions (Addendum)
Water 8 glasses a day Vitamin water 1-2 day  [place]  Fruit in water Lemon, lime, watermelon

## 2021-05-20 NOTE — Progress Notes (Signed)
Mr.     [address]       [place] [address]             [phone]    HPI: [name] returns for follow-up of his thoracic aortic aneurysms.  Steven Williams is an 87 year old man with a history of thoracic aortic atherosclerosis, aortic dissection, descending thoracic aneurysm, hypertension, hyperlipidemia, heart murmur, bradycardia, pacemaker, permanent atrial fibrillation, DVT, patient is disease, rheumatoid arthritis, TIA, COPD, and chronic kidney disease.  He had an aortic dissection in 2011 when he was living in [place].  He was followed for that.  He moved to [place] in 2019, and we have assumed follow-up.  He was last seen in March 2022 by Dr. Roxan Hockey and his aneurysms were stable at that time. He has been doing well since we last saw him.     Past Medical History:  Diagnosis Date   Anticoagulated 06/26/2020   Antiphospholipid antibody positive    Aortic aneurysm (HCC)    thoracic, Dr Roxan Hockey   Aortic dissection [facility])    BPH (benign prostatic hyperplasia)    Central retinal artery occlusion    Chronic anemia    Chronic renal insufficiency    COPD (chronic obstructive pulmonary disease) (HCC)    DDD (degenerative disc disease), cervical    DJD (degenerative joint disease)    DVT (deep venous thrombosis) (HCC)    Gastric dysmotility    GERD (gastroesophageal reflux disease)    Gout    HTN (hypertension)    Hyperlipidemia    Hypertensive retinopathy    OU   Hypothyroid    Macular degeneration    Myelodysplasia (myelodysplastic syndrome) ([place])    Nasal septal deviation 06/26/2020   Paget's disease of bone    Peripheral neuropathy    Permanent atrial fibrillation (HCC)    Recurrent epistaxis 06/26/2020   Rheumatoid arthritis (HCC)    Symptomatic bradycardia    TIA (transient ischemic attack)       Current Facility-Administered Medications  Medication Dose Route Frequency Provider Last Rate Last Admin   Bevacizumab  (AVASTIN) SOLN 1.25 mg  1.25 mg Intravitreal  [name], MD   1.25 mg at 11/01/18 2343   diphenhydrAMINE (BENADRYL) capsule 50 mg  50 mg Oral Once Logan Bores, MD        Physical Exam Vitals:   05/20/21 1310  BP: (!) 91/54  Pulse: 66  Resp: 20  SpO2: 97%    Current Outpatient Medications on File Prior to Visit  Medication Sig Dispense Refill   acetaminophen (TYLENOL) 500 MG tablet Take 1,000 mg by mouth daily.     allopurinol (ZYLOPRIM) 100 MG tablet Take 100 mg by mouth daily.     carvedilol (COREG) 6.25 MG tablet Take 6.25 mg by mouth 2 (two) times daily with a meal.     Cholecalciferol (VITAMIN D3 PO) Take 1 tablet by mouth in the morning and at bedtime.     dicyclomine (BENTYL) 10 MG capsule Take 30 mg by mouth in the morning and at bedtime.     digoxin (LANOXIN) 0.125 MG tablet Take 0.125 mg by mouth every Monday, Wednesday, and Friday.     Ferrous Sulfate (IRON) 325 (65 Fe) MG TABS Take 1 tablet by mouth 2 (two) times daily.     furosemide (LASIX) 40 MG tablet Take 40 mg by mouth daily.     hydrocortisone (ANUSOL-HC) 2.5 % rectal cream Apply 1 application topically 2 (two) times daily as needed  for hemorrhoids or anal itching.     levothyroxine (SYNTHROID) 100 MCG tablet Take 100 mcg by mouth every morning.     Misc Natural Products (GLUCOSAMINE CHOND COMPLEX/MSM PO) Take 1 tablet by mouth daily.     Multiple Vitamins-Minerals (PRESERVISION AREDS PO) Take 1 tablet by mouth 2 (two) times daily.     oxymetazoline (AFRIN 12 HOUR) 0.05 % nasal spray Place 2 sprays into both nostrils 2 (two) times daily as needed for congestion.     pantoprazole (PROTONIX) 40 MG tablet Take 40 mg by mouth daily.     pregabalin (LYRICA) 75 MG capsule Take 75 mg by mouth at bedtime.     Ranibizumab (LUCENTIS) 0.3 MG/0.05ML SOLN every 8 (eight) weeks.     rOPINIRole (REQUIP) 2 MG tablet Take 2 mg by mouth at bedtime.     rosuvastatin (CRESTOR) 10 MG tablet Take 10 mg by mouth daily.     saw  palmetto 500 MG capsule Take 500 mg by mouth 2 (two) times daily.     tamsulosin (FLOMAX) 0.4 MG CAPS capsule Take 0.4 mg by mouth daily.     warfarin (COUMADIN) 2 MG tablet Take by mouth daily.     Current Facility-Administered Medications on File Prior to Visit  Medication Dose Route Frequency Provider Last Rate Last Admin   Bevacizumab (AVASTIN) SOLN 1.25 mg  1.25 mg Intravitreal  [name], MD   1.25 mg at 11/01/18 2343   diphenhydrAMINE (BENADRYL) capsule 50 mg  50 mg Oral Once Logan Bores, MD         87 year old man in no acute distress Alert and oriented x3 NSR, no murmur No carotid bruits Cardiac regular rate and rhythm with a 2/6 systolic murmur Lungs clear bilaterally Trace edema with venous stasis changes both lower extremities  Diagnostic Tests:  CLINICAL DATA:  Follow-up thoracic aortic aneurysm   EXAM: CT CHEST WITHOUT CONTRAST   TECHNIQUE: Multidetector CT imaging of the chest was performed following the standard protocol without IV contrast.   COMPARISON:  11/06/2020   FINDINGS: Cardiovascular: Cardiac enlargement. Aortic atherosclerosis and coronary artery calcifications. The ascending thoracic aorta measures 4.1 cm, image 92/2. This is stable compared with the previous exam. Saccular aneurysm is seen in the proximal descending thoracic aorta measuring 5 cm, image 68/2. This is stable when compared to the previous study using the same measuring technique. The descending thoracic aorta measures 4 cm, image 109/2 distally. Also unchanged.   Mediastinum/Nodes: No enlarged mediastinal or axillary lymph nodes. Thyroid gland, trachea, and esophagus demonstrate no significant findings.   Lungs/Pleura: Paraseptal and centrilobular emphysema. 3 mm right middle lobe lung nodule is unchanged, image 91/8. Stable since 04/06/2019 compatible with a benign abnormality. Pleural thickening and rounded atelectasis in the posteromedial right lower lobe as before.  Scarring is again seen within the lingula. No pleural effusion, airspace consolidation, or pneumothorax. Calcified granuloma is present in the left base.   Upper Abdomen: No acute abnormality within the imaged portions of the upper abdomen. Calcified granulomas identified within the spleen. Stable cyst within lateral segment of left hepatic lobe measuring 1.8 cm.   Musculoskeletal: Degenerative changes noted within the thoracic spine. No acute or suspicious osseous findings.   IMPRESSION: 1. Stable aneurysmal dilatation of the ascending and descending thoracic aorta. Recommend annual imaging followup by CTA or MRA. This recommendation follows 2010 ACCF/AHA/AATS/ACR/ASA/SCA/SCAI/SIR/STS/SVM Guidelines for the Diagnosis and Management of Patients with Thoracic Aortic Disease. Circulation. 2010; 121: I712-W580. Aortic aneurysm NOS (ICD10-I71.9) 2. Stable  5 cm saccular aneurysm arising from the proximal descending thoracic aorta. 3. Coronary artery calcifications.   Aortic Atherosclerosis (ICD10-I70.0) and Emphysema (ICD10-J43.9).     Electronically Signed   By: Kerby Moors M.D.   On: 05/15/2021 12:27 CT CHEST WITHOUT CONTRAST   TECHNIQUE: Multidetector CT imaging of the chest was performed following the standard protocol without IV contrast.   COMPARISON:  05/08/2020 and 04/06/2019.   FINDINGS: Cardiovascular: Atherosclerotic calcification of the aorta, aortic valve and coronary arteries. Ascending aorta measures 4.4 cm, stable when remeasured. A saccular aneurysm is again seen in the proximal descending thoracic aorta, due to lack of IV contrast. However, the lumen collectively measures 5.0 cm (2/74), stable when remeasured on the prior study in a similar fashion. Descending thoracic aorta measures up to 4.2 cm distally. Heart is enlarged. No pericardial effusion.   Mediastinum/Nodes: Mediastinal lymph nodes are not enlarged by CT size criteria. Hilar regions are  difficult to evaluate without IV contrast. Axillary lymph nodes are subcentimeter in size. Esophagus is grossly unremarkable.   Lungs/Pleura: Centrilobular and paraseptal emphysema. Calcified granulomas. 3 mm right middle lobe nodule (8/94), unchanged from 04/06/2019 and benign. Pleural thickening and rounded atelectasis in the posteromedial right lower lobe, as before. Scarring in the lingula. Lungs are otherwise clear. No pleural fluid airway is unremarkable.   Upper Abdomen: Low-attenuation lesions in the liver measure up to 1.9 cm, unchanged and likely cysts. Visualized portions of the liver, gallbladder, adrenal glands and right kidney are otherwise unremarkable. Low-attenuation lesion in the medial left kidney measures 11 mm, incompletely visualized. Visualized portions of the spleen, pancreas, stomach and bowel are unremarkable.   Musculoskeletal: Degenerative changes in the spine. No worrisome lytic or sclerotic lesions.   IMPRESSION:  1. Saccular descending thoracic aortic aneurysm, stable. Fusiform aneurysms of the ascending and descending thoracic aorta, also stable. Recommend annual imaging followup by CTA or MRA or as clinically indicated. This recommendation follows 2010 ACCF/AHA/AATS/ACR/ASA/SCA/SCAI/SIR/STS/SVM Guidelines for the Diagnosis and Management of Patients with Thoracic Aortic Disease. Circulation. 2010; 121: I097-D532. Aortic aneurysm NOS (ICD10-I71.9). 2. Aortic atherosclerosis (ICD10-I70.0). Coronary artery calcification. 3.  Emphysema (ICD10-J43.9).     Electronically Signed   By: Lorin Picket M.D.   On: 11/06/2020 11:30  Impression:  Steven Williams is an 87 year old man with a history of thoracic aortic atherosclerosis, aortic dissection, descending thoracic aneurysm, hypertension, hyperlipidemia, heart murmur, bradycardia, pacemaker, permanent atrial fibrillation, DVT, patient is disease, rheumatoid arthritis, TIA, COPD, and chronic kidney  disease.  Thoracic aneurysms-fusiform aneurysms of ascending and descending thoracic aorta, saccular aneurysm at site of limited prior dissection.  All unchanged on current CT.  We will plan to continue with semiannual follow-up with a non-contrast CT scan  Orthostatic hypotension-recommended increasing water intake. His BP was a little low in the office but the patient states at home its usually in the 992E systolic  Chronic kidney disease-his creatinine is 1.7 on his last set of labs. He is on lasix per his PCP. He states he is on it for a hx of pleural effusion and would like to stop taking it. I will leave this up to his primary doctors.   We discussed a low-sodium diet- He watches his salt and sugar intake.   Plan:  Return in 6 months for CT chest without contrast for follow-up of thoracic aortic aneurysms.  I spent over 30 minutes in review of records, images, and consultation with Mr. Charters today.  Nicholes Rough, PA-C [facility] [phone]

## 2021-06-02 ENCOUNTER — Ambulatory Visit (INDEPENDENT_AMBULATORY_CARE_PROVIDER_SITE_OTHER): Payer: Medicare Other | Admitting: Diagnostic Neuroimaging

## 2021-06-02 ENCOUNTER — Other Ambulatory Visit: Payer: Self-pay | Admitting: Diagnostic Neuroimaging

## 2021-06-02 ENCOUNTER — Encounter: Payer: Self-pay | Admitting: Diagnostic Neuroimaging

## 2021-06-02 VITALS — BP 144/69 | HR 61 | Ht 74.0 in | Wt 188.0 lb

## 2021-06-02 DIAGNOSIS — G2581 Restless legs syndrome: Secondary | ICD-10-CM | POA: Diagnosis not present

## 2021-06-02 MED ORDER — ROTIGOTINE 2 MG/24HR TD PT24: 1.0000 | MEDICATED_PATCH | Freq: Every day | TRANSDERMAL | 12 refills | Status: DC

## 2021-06-02 NOTE — Patient Instructions (Addendum)
  NEUROPATHY / RLS  - continue lyrica 150mg  twice a day   - change ropinirole to rotigotine (neupro) patch (2mg  / 24 hour)  - Mental alerting activities, such as working on a computer or doing crossword puzzles, at times of rest or boredom  -Moderate regular exercise  -A trial of abstinence from caffeine and alcohol  -For symptomatic relief - walking, bicycling, soaking the affected limbs, and leg massage, including pneumatic compression

## 2021-06-02 NOTE — Progress Notes (Signed)
[facility]  PATIENT: Steven Williams DOB: Oct 06, 1929  REFERRING CLINICIAN: Ginger Organ., MD HISTORY FROM: patient  REASON FOR VISIT: follow up   HISTORICAL  CHIEF COMPLAINT:  Chief Complaint  Patient presents with   restless leg    Rm 7 alone Pt is well, having some RLS in both legs. In the last week or so it has slightly gotten better but previously he couldn't sleep well with it.     HISTORY OF PRESENT ILLNESS:   UPDATE (06/02/21, VRP): Since last visit, doing worse with RLS. Sxs better in last few days. Worse in the evening. On lyrica and ropinirole.   PRIOR HPI: 87 year old male here for evaluation of neuropathy and restless leg syndrome.  For past 5 years patient has had numbness and tingling in his toes and feet, progressively increasing up to his shins.  He also has history of low back surgeries x2.  Patient was diagnosed with neuropathy, but no specific cause was found.  He has been started on gabapentin, switched to Lyrica with mild relief.  Also was developing restless leg symptoms at nighttime and starting ropinirole which seems to have helped.  No numbness or tingling in fingers or hands.    REVIEW OF SYSTEMS: Full 14 system review of systems performed and negative with exception of: As per HPI.  ALLERGIES: Allergies  Allergen Reactions   Iodinated Diagnostic Agents Rash    Other reaction(s): rash    HOME MEDICATIONS: Outpatient Medications Prior to Visit  Medication Sig Dispense Refill   acetaminophen (TYLENOL) 500 MG tablet Take 1,000 mg by mouth daily.     allopurinol (ZYLOPRIM) 100 MG tablet Take 100 mg by mouth daily.     carvedilol (COREG) 6.25 MG tablet Take 6.25 mg by mouth 2 (two) times daily with a meal.     Cholecalciferol (VITAMIN D3 PO) Take 1 tablet by mouth in the morning and at bedtime.     dicyclomine (BENTYL) 10 MG capsule Take 30 mg by mouth in the morning and at bedtime.     digoxin (LANOXIN) 0.125 MG tablet  Take 0.125 mg by mouth every Monday, Wednesday, and Friday.     Ferrous Sulfate (IRON) 325 (65 Fe) MG TABS Take 1 tablet by mouth 2 (two) times daily.     furosemide (LASIX) 40 MG tablet Take 40 mg by mouth daily.     hydrocortisone (ANUSOL-HC) 2.5 % rectal cream Apply 1 application topically 2 (two) times daily as needed for hemorrhoids or anal itching.     levothyroxine (SYNTHROID) 100 MCG tablet Take 100 mcg by mouth every morning.     Misc Natural Products (GLUCOSAMINE CHOND COMPLEX/MSM PO) Take 1 tablet by mouth daily.     Multiple Vitamins-Minerals (PRESERVISION AREDS PO) Take 1 tablet by mouth 2 (two) times daily.     oxymetazoline (AFRIN 12 HOUR) 0.05 % nasal spray Place 2 sprays into both nostrils 2 (two) times daily as needed for congestion.     pantoprazole (PROTONIX) 40 MG tablet Take 40 mg by mouth daily.     pregabalin (LYRICA) 75 MG capsule Take 75 mg by mouth at bedtime.     Ranibizumab (LUCENTIS) 0.3 MG/0.05ML SOLN every 8 (eight) weeks.     rosuvastatin (CRESTOR) 10 MG tablet Take 10 mg by mouth daily.     saw palmetto 500 MG capsule Take 500 mg by mouth 2 (two) times daily.     tamsulosin (FLOMAX) 0.4 MG CAPS capsule Take 0.4 mg  by mouth daily.     warfarin (COUMADIN) 2 MG tablet Take by mouth daily.     rOPINIRole (REQUIP) 2 MG tablet Take 2 mg by mouth at bedtime.     Facility-Administered Medications Prior to Visit  Medication Dose Route Frequency Provider Last Rate Last Admin   Bevacizumab (AVASTIN) SOLN 1.25 mg  1.25 mg Intravitreal  [name], MD   1.25 mg at 11/01/18 2343   diphenhydrAMINE (BENADRYL) capsule 50 mg  50 mg Oral Once Logan Bores, MD        PAST MEDICAL HISTORY: Past Medical History:  Diagnosis Date   Anticoagulated 06/26/2020   Antiphospholipid antibody positive    Aortic aneurysm ([place])    thoracic, Dr Roxan Hockey   Aortic dissection [facility])    BPH (benign prostatic hyperplasia)    Central retinal artery occlusion    Chronic anemia     Chronic renal insufficiency    COPD (chronic obstructive pulmonary disease) (HCC)    DDD (degenerative disc disease), cervical    DJD (degenerative joint disease)    DVT (deep venous thrombosis) (HCC)    Gastric dysmotility    GERD (gastroesophageal reflux disease)    Gout    HTN (hypertension)    Hyperlipidemia    Hypertensive retinopathy    OU   Hypothyroid    Macular degeneration    Myelodysplasia (myelodysplastic syndrome) (HCC)    Nasal septal deviation 06/26/2020   Paget's disease of bone    Peripheral neuropathy    Permanent atrial fibrillation (HCC)    Recurrent epistaxis 06/26/2020   Rheumatoid arthritis (HCC)    Symptomatic bradycardia    TIA (transient ischemic attack)     PAST SURGICAL HISTORY: Past Surgical History:  Procedure Laterality Date   ATRIAL FIBRILLATION ABLATION  09/23/2014   Dr Encarnacion Chu in [place]     lumbar, x 2   CAROTID ENDARTERECTOMY Right 1990   CAROTID STENT     CATARACT EXTRACTION Bilateral 2010   [place]   EYE SURGERY Bilateral 2010   [facility]   LUNG DECORTICATION     VATS   PACEMAKER GENERATOR CHANGE  12/03/2011   MDT Adapta ADDR01 generator change by Dr Encarnacion Chu for symptomatic bradycardia   PACEMAKER IMPLANT  2004   MDT     FAMILY HISTORY: Family History  Problem Relation Age of Onset   Cirrhosis Mother    Stroke Father    Lung cancer Sister    Lung cancer Brother     SOCIAL HISTORY: Social History   Socioeconomic History   Marital status: Widowed    Spouse name: Not on file   Number of children: 3   Years of education: Not on file   Highest education level: Bachelor's degree (e.g., BA, AB, BS)  Occupational History   Occupation: retired    Comment: retired [employer]  Tobacco Use   Smoking status: Former    Packs/day: 2.00    Years: 12.00    Pack years: 24.00    Types: Cigarettes    Quit date: 09/07/1962    Years since quitting: 58.7   Smokeless tobacco: Never  Vaping Use   Vaping Use: Never used   Substance and Sexual Activity   Alcohol use: Yes    Alcohol/week: 2.0 standard drinks    Types: 2 Glasses of wine per week    Comment: daily   Drug use: Never   Sexual activity: Not on file  Other Topics Concern   Not on file  Social History Narrative   Previously lived in [place]   03/12/20 Now lives in [place] ([place]   Retired Chief Financial Officer   Caffeine 2 a day   Social Determinants of Radio broadcast assistant Strain: Not on file  Food Insecurity: Not on file  Transportation Needs: Not on file  Physical Activity: Not on file  Stress: Not on file  Social Connections: Not on file  Intimate Partner Violence: Not on file     PHYSICAL EXAM  GENERAL EXAM/CONSTITUTIONAL: Vitals:  Vitals:   06/02/21 1326  BP: (!) 144/69  Pulse: 61  Weight: 188 lb (85.3 kg)  Height: 6\' 2"  (1.88 m)   Body mass index is 24.14 kg/m. Wt Readings from Last 3 Encounters:  06/02/21 188 lb (85.3 kg)  05/20/21 187 lb (84.8 kg)  05/14/21 190 lb 9.6 oz (86.5 kg)   Patient is in no distress; well developed, nourished and groomed; neck is supple  CARDIOVASCULAR: Examination of carotid arteries is normal; no carotid bruits Regular rate and rhythm, no murmurs Examination of peripheral vascular system by observation and palpation is normal  EYES: Ophthalmoscopic exam of optic discs and posterior segments is normal; no papilledema or hemorrhages No results found.  MUSCULOSKELETAL: Gait, strength, tone, movements noted in Neurologic exam below  NEUROLOGIC: MENTAL STATUS:  No flowsheet data found. awake, alert, oriented to person, place and time recent and remote memory intact normal attention and concentration language fluent, comprehension intact, naming intact fund of knowledge appropriate  CRANIAL NERVE:  2nd - no papilledema on fundoscopic exam 2nd, 3rd, 4th, 6th - pupils equal and reactive to light, visual fields full to confrontation, extraocular muscles intact, no  nystagmus 5th - facial sensation symmetric 7th - facial strength symmetric 8th - hearing intact 9th - palate elevates symmetrically, uvula midline 11th - shoulder shrug symmetric 12th - tongue protrusion midline  MOTOR:  normal bulk and tone, full strength in the BUE, BLE  SENSORY:  normal and symmetric to light touch, temperature, vibration; EXCEPT DECR IN FEET AND ANKLES  COORDINATION:  finger-nose-finger, fine finger movements normal  REFLEXES:  deep tendon reflexes TRACE and symmetric  GAIT/STATION:  narrow based gait; careful walking with single point cane     DIAGNOSTIC DATA (LABS, IMAGING, TESTING) - I reviewed patient records, labs, notes, testing and imaging myself where available.  Lab Results  Component Value Date   WBC 3.8 05/14/2021   HGB 9.6 (L) 05/14/2021   HCT 27.8 (L) 05/14/2021   MCV 98 (H) 05/14/2021   PLT 121 (L) 05/14/2021      Component Value Date/Time   NA 139 05/14/2021 1001   K 4.6 05/14/2021 1001   CL 103 05/14/2021 1001   CO2 20 05/14/2021 1001   GLUCOSE 99 05/14/2021 1001   GLUCOSE 99 04/20/2021 1415   BUN 39 (H) 05/14/2021 1001   CREATININE 1.70 (H) 05/14/2021 1001   CALCIUM 9.2 05/14/2021 1001   GFRNONAA 29 (L) 04/20/2021 1415   GFRAA 35 (L) 10/24/2020 1141   No results found for: CHOL, HDL, LDLCALC, LDLDIRECT, TRIG, CHOLHDL No results found for: HGBA1C No results found for: VITAMINB12 No results found for: TSH  2021 - ferritin 220 - iron 66 - B12 645 - TSH 7.07    ASSESSMENT AND PLAN  87 y.o. year old male here with:  Dx:  1. Restless leg syndrome      PLAN:  NEUROPATHY / RLS (? Idiopathic / age related; CKD) - continue lyrica 150mg  twice a day   -  change ropinirole to rotigotine patch (2mg  / 24 hour)  - consider carb/levo or clonazepam in future  - Mental alerting activities, such as working on a computer or doing crossword puzzles, at times of rest or boredom  -Moderate regular exercise  -A trial  of abstinence from caffeine and alcohol  -For symptomatic relief - walking, bicycling, soaking the affected limbs, and leg massage, including pneumatic compression  Meds ordered this encounter  Medications   rotigotine (NEUPRO) 2 MG/24HR    Sig: Place 1 patch onto the skin daily.    Dispense:  30 patch    Refill:  12   Return in about 6 months (around 11/30/2021).    Penni Bombard, MD 3/73/6681, 5:94 PM Certified in Neurology, Neurophysiology and Neuroimaging  [facility] [address] [place], [place] [address] [phone]

## 2021-06-03 ENCOUNTER — Telehealth: Payer: Self-pay | Admitting: Diagnostic Neuroimaging

## 2021-06-03 NOTE — Telephone Encounter (Signed)
Any other suggestions? I tried [employer] to see any discounts but none are available

## 2021-06-03 NOTE — Telephone Encounter (Signed)
Pt states the new medication suggested by Dr Leta Baptist is $400.00 Pt states he will stay on current medication as a result of this unless there are other options. Please call

## 2021-06-06 ENCOUNTER — Telehealth: Payer: Self-pay | Admitting: Hematology and Oncology

## 2021-06-06 NOTE — Telephone Encounter (Signed)
Scheduled appt per 9/28 referral. Pt is aware of appt date and time. Per pt request will mail updated calendar to pt.

## 2021-06-16 ENCOUNTER — Telehealth: Payer: Self-pay | Admitting: Hematology and Oncology

## 2021-06-16 ENCOUNTER — Ambulatory Visit (HOSPITAL_COMMUNITY)
Admission: RE | Admit: 2021-06-16 | Discharge: 2021-06-16 | Disposition: A | Discharge status: Home / Self Care | Payer: Medicare Other | Source: Ambulatory Visit | Attending: Internal Medicine | Admitting: Internal Medicine

## 2021-06-16 ENCOUNTER — Other Ambulatory Visit: Payer: Self-pay

## 2021-06-16 ENCOUNTER — Other Ambulatory Visit (HOSPITAL_COMMUNITY): Payer: Self-pay | Admitting: Internal Medicine

## 2021-06-16 DIAGNOSIS — I6529 Occlusion and stenosis of unspecified carotid artery: Secondary | ICD-10-CM

## 2021-06-16 NOTE — Telephone Encounter (Signed)
R/s appt per pt's request. Pt is aware of new appt date and time.

## 2021-06-18 ENCOUNTER — Inpatient Hospital Stay: Payer: Medicare Other

## 2021-06-18 ENCOUNTER — Inpatient Hospital Stay: Payer: Medicare Other | Admitting: Hematology and Oncology

## 2021-06-23 ENCOUNTER — Other Ambulatory Visit: Payer: Self-pay

## 2021-06-23 ENCOUNTER — Inpatient Hospital Stay: Payer: Medicare Other

## 2021-06-23 ENCOUNTER — Inpatient Hospital Stay: Payer: Medicare Other | Attending: Hematology and Oncology | Admitting: Hematology and Oncology

## 2021-06-23 VITALS — BP 118/58 | HR 77 | Temp 97.6°F | Resp 18 | Wt 184.3 lb

## 2021-06-23 DIAGNOSIS — D696 Thrombocytopenia, unspecified: Secondary | ICD-10-CM

## 2021-06-23 DIAGNOSIS — D469 Myelodysplastic syndrome, unspecified: Secondary | ICD-10-CM | POA: Insufficient documentation

## 2021-06-23 DIAGNOSIS — E039 Hypothyroidism, unspecified: Secondary | ICD-10-CM | POA: Diagnosis not present

## 2021-06-23 DIAGNOSIS — I129 Hypertensive chronic kidney disease with stage 1 through stage 4 chronic kidney disease, or unspecified chronic kidney disease: Secondary | ICD-10-CM | POA: Diagnosis not present

## 2021-06-23 DIAGNOSIS — D649 Anemia, unspecified: Secondary | ICD-10-CM

## 2021-06-23 DIAGNOSIS — N189 Chronic kidney disease, unspecified: Secondary | ICD-10-CM | POA: Diagnosis not present

## 2021-06-23 DIAGNOSIS — E038 Other specified hypothyroidism: Secondary | ICD-10-CM

## 2021-06-23 LAB — RETIC PANEL
Immature Retic Fract: 13.8 % (ref 2.3–15.9)
RBC.: 3.17 MIL/uL — ABNORMAL LOW (ref 4.22–5.81)
Retic Count, Absolute: 52.3 10*3/uL (ref 19.0–186.0)
Retic Ct Pct: 1.7 % (ref 0.4–3.1)
Reticulocyte Hemoglobin: 36.3 pg (ref >27.9)

## 2021-06-23 LAB — CMP (CANCER CENTER ONLY)
ALT: 13 U/L (ref 0–44)
AST: 21 U/L (ref 15–41)
Albumin: 4.3 g/dL (ref 3.5–5.0)
Alkaline Phosphatase: 185 U/L — ABNORMAL HIGH (ref 38–126)
Anion gap: 13 (ref 5–15)
BUN: 59 mg/dL — ABNORMAL HIGH (ref 8–23)
CO2: 23 mmol/L (ref 22–32)
Calcium: 10.4 mg/dL — ABNORMAL HIGH (ref 8.9–10.3)
Chloride: 107 mmol/L (ref 98–111)
Creatinine: 2.18 mg/dL — ABNORMAL HIGH (ref 0.61–1.24)
GFR, Estimated: 28 mL/min — ABNORMAL LOW (ref >60)
Glucose, Bld: 105 mg/dL — ABNORMAL HIGH (ref 70–99)
Potassium: 3.9 mmol/L (ref 3.5–5.1)
Sodium: 143 mmol/L (ref 135–145)
Total Bilirubin: 0.6 mg/dL (ref 0.3–1.2)
Total Protein: 7.6 g/dL (ref 6.5–8.1)

## 2021-06-23 LAB — CBC WITH DIFFERENTIAL (CANCER CENTER ONLY)
Abs Immature Granulocytes: 0.02 10*3/uL (ref 0.00–0.07)
Basophils Absolute: 0 10*3/uL (ref 0.0–0.1)
Basophils Relative: 1 %
Eosinophils Absolute: 0.1 10*3/uL (ref 0.0–0.5)
Eosinophils Relative: 3 %
HCT: 33.1 % — ABNORMAL LOW (ref 39.0–52.0)
Hemoglobin: 10.9 g/dL — ABNORMAL LOW (ref 13.0–17.0)
Immature Granulocytes: 0 %
Lymphocytes Relative: 17 %
Lymphs Abs: 0.9 10*3/uL (ref 0.7–4.0)
MCH: 33.7 pg (ref 26.0–34.0)
MCHC: 32.9 g/dL (ref 30.0–36.0)
MCV: 102.5 fL — ABNORMAL HIGH (ref 80.0–100.0)
Monocytes Absolute: 0.5 10*3/uL (ref 0.1–1.0)
Monocytes Relative: 10 %
Neutro Abs: 3.7 10*3/uL (ref 1.7–7.7)
Neutrophils Relative %: 69 %
Platelet Count: 114 10*3/uL — ABNORMAL LOW (ref 150–400)
RBC: 3.23 MIL/uL — ABNORMAL LOW (ref 4.22–5.81)
RDW: 14.1 % (ref 11.5–15.5)
WBC Count: 5.4 10*3/uL (ref 4.0–10.5)
nRBC: 0 % (ref 0.0–0.2)

## 2021-06-23 LAB — LACTATE DEHYDROGENASE: LDH: 186 U/L (ref 98–192)

## 2021-06-23 LAB — VITAMIN B12: Vitamin B-12: 470 pg/mL (ref 180–914)

## 2021-06-23 LAB — FOLATE: Folate: 31.7 ng/mL (ref >5.9)

## 2021-06-23 NOTE — Progress Notes (Signed)
[place] [phone]   Fax:([phone] [place] NOTE  Patient Care Team: Ginger Organ., MD as PCP - General (Internal Medicine) Thompson Grayer, MD as PCP - Electrophysiology (Cardiology) Ginger Organ., MD as Referring Physician (Internal Medicine)  Hematological/Oncological History # Thrombocytopenia 08/30/2019: WBC 4.9, Hgb 10.3, MCV 100, Plt 109 03/06/2020: WBC 4.2, Hgb 10.7, MCV 100, Plt 78 05/14/2021: WBC 3.8, Hgb 9.6, MCV 98, Plt 121 06/23/2021: establish care with Dr. Lorenso Courier   CHIEF COMPLAINTS/PURPOSE OF CONSULTATION:  "Thrombocytopenia "  HISTORY OF PRESENTING ILLNESS:  Steven Williams 87 y.o. male with medical history significant for COPD, GERD, hypertension, hyperlipidemia, myelodysplastic syndrome, Paget's disease and recurrent nosebleeds who presents for evaluation of anemia and thrombocytopenia.  On review of the previous records Steven Williams does have reported history of myelodysplastic syndrome though no further details or bone marrow biopsy are available in the records.  He has had anemia and thrombocytopenia dating back to at least 08/30/2019 at which time he was noted to have a hemoglobin of 10.3 with an MCV of 100 and platelet count of 109.  He is remained relatively stable since that time.  Last checked on 05/15/2019 the patient was noted to have a hemoglobin 9.6 with an MCV of 98 and a platelet count of 121.  Due to concern for these findings the patient was referred to hematology for further evaluation and management.  On exam today Steven Williams notes that he does have issues with recurrent nosebleeds.  He reports that when he moved from [place] to [place] he began having nosebleeds more frequently.  He notes that he has been hospitalized 3 times for these when he has difficulty getting the bleeding stopped.  He has established care with ENT who is helping him keep these under control.  He notes his last major  nosebleed was about 2 months ago.  He does endorse having occasional dark stools but no other overt signs of bleeding.  On further discussion Steven Williams reports that he does not have any dietary restrictions.  He typically does not eat red meat but does eat fish, slashes, and vegetables without restriction.  He notes that he was a former smoker having quit at the age of 27 roughly 39 years ago.  He notes he drinks 1 glass of wine nightly with a preference for red wine.  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  His family history is remarkable for stroke in his father and cirrhosis in his mother.  His brother passed away of lung cancer and did have an older sister with breast cancer.  He notes no other family history of any blood disorders.  Regarding symptoms he notes that he exercises every day typically for about 30 minutes.  He reports that he used to jog quite a lot but does not have the strength with it anymore.  Has had no major changes in his health otherwise.  He currently denies any fevers, chills, sweats, nausea, vomiting or diarrhea.  Full 10 point ROS is listed below.  MEDICAL HISTORY:  Past Medical History:  Diagnosis Date   Anticoagulated 06/26/2020   Antiphospholipid antibody positive    Aortic aneurysm ([place])    thoracic, Dr Roxan Hockey   Aortic dissection [facility])    BPH (benign prostatic hyperplasia)    Central retinal artery occlusion    Chronic anemia    Chronic renal insufficiency    COPD (chronic  obstructive pulmonary disease) (HCC)    DDD (degenerative disc disease), cervical    DJD (degenerative joint disease)    DVT (deep venous thrombosis) (HCC)    Gastric dysmotility    GERD (gastroesophageal reflux disease)    Gout    HTN (hypertension)    Hyperlipidemia    Hypertensive retinopathy    OU   Hypothyroid    Macular degeneration    Myelodysplasia (myelodysplastic syndrome) ([place])    Nasal septal deviation 06/26/2020    Paget's disease of bone    Peripheral neuropathy    Permanent atrial fibrillation (HCC)    Recurrent epistaxis 06/26/2020   Rheumatoid arthritis (HCC)    Symptomatic bradycardia    TIA (transient ischemic attack)     SURGICAL HISTORY: Past Surgical History:  Procedure Laterality Date   ATRIAL FIBRILLATION ABLATION  09/23/2014   Dr Encarnacion Chu in [place]     lumbar, x 2   CAROTID ENDARTERECTOMY Right 1990   CAROTID STENT     CATARACT EXTRACTION Bilateral 2010   [place]   EYE SURGERY Bilateral 2010   [facility]   LUNG DECORTICATION     VATS   PACEMAKER GENERATOR CHANGE  12/03/2011   [facility] Adapta ADDR01 generator change by Dr Encarnacion Chu for symptomatic bradycardia   PACEMAKER IMPLANT  2004   MDT     SOCIAL HISTORY: Social History   Socioeconomic History   Marital status: Widowed    Spouse name: Not on file   Number of children: 3   Years of education: Not on file   Highest education level: Bachelor's degree (e.g., BA, AB, BS)  Occupational History   Occupation: retired    Comment: retired ME  Tobacco Use   Smoking status: Former    Packs/day: 2.00    Years: 12.00    Pack years: 24.00    Types: Cigarettes    Quit date: 09/07/1962    Years since quitting: 58.8   Smokeless tobacco: Never  Vaping Use   Vaping Use: Never used  Substance and Sexual Activity   Alcohol use: Yes    Alcohol/week: 2.0 standard drinks    Types: 2 Glasses of wine per week    Comment: daily   Drug use: Never   Sexual activity: Not on file  Other Topics Concern   Not on file  Social History Narrative   Previously lived in [place]   03/12/20 Now lives in [place]   Retired Chief Financial Officer   Caffeine 2 a day   Social Determinants of Radio broadcast assistant Strain: Not on file  Food Insecurity: Not on file  Transportation Needs: Not on file  Physical Activity: Not on file  Stress: Not on file  Social Connections: Not on file  Intimate Partner Violence: Not on  file    FAMILY HISTORY: Family History  Problem Relation Age of Onset   Cirrhosis Mother    Stroke Father    Lung cancer Sister    Lung cancer Brother     ALLERGIES:  is allergic to iodinated diagnostic agents.  MEDICATIONS:  Current Outpatient Medications  Medication Sig Dispense Refill   acetaminophen (TYLENOL) 500 MG tablet Take 1,000 mg by mouth daily.     allopurinol (ZYLOPRIM) 100 MG tablet Take 100 mg by mouth daily.     carvedilol (COREG) 6.25 MG tablet Take 6.25 mg by mouth 2 (two) times daily with a meal.     Cholecalciferol (VITAMIN D3 PO) Take 1 tablet  by mouth in the morning and at bedtime.     dicyclomine (BENTYL) 10 MG capsule Take 30 mg by mouth in the morning and at bedtime.     digoxin (LANOXIN) 0.125 MG tablet Take 0.125 mg by mouth every Monday, Wednesday, and Friday.     Ferrous Sulfate (IRON) 325 (65 Fe) MG TABS Take 1 tablet by mouth 2 (two) times daily.     furosemide (LASIX) 40 MG tablet Take 40 mg by mouth daily.     hydrocortisone (ANUSOL-HC) 2.5 % rectal cream Apply 1 application topically 2 (two) times daily as needed for hemorrhoids or anal itching.     levothyroxine (SYNTHROID) 100 MCG tablet Take 100 mcg by mouth every morning.     Misc Natural Products (GLUCOSAMINE CHOND COMPLEX/MSM PO) Take 1 tablet by mouth daily.     Multiple Vitamins-Minerals (PRESERVISION AREDS PO) Take 1 tablet by mouth 2 (two) times daily.     pantoprazole (PROTONIX) 40 MG tablet Take 40 mg by mouth daily.     pregabalin (LYRICA) 75 MG capsule Take 75 mg by mouth at bedtime.     Ranibizumab (LUCENTIS) 0.3 MG/0.05ML SOLN every 8 (eight) weeks.     rosuvastatin (CRESTOR) 10 MG tablet Take 10 mg by mouth daily.     saw palmetto 500 MG capsule Take 500 mg by mouth 2 (two) times daily.     tamsulosin (FLOMAX) 0.4 MG CAPS capsule Take 0.4 mg by mouth daily.     warfarin (COUMADIN) 2 MG tablet Take by mouth daily.     oxymetazoline (AFRIN 12 HOUR) 0.05 % nasal spray Place 2 sprays  into both nostrils 2 (two) times daily as needed for congestion. (Patient not taking: Reported on 06/23/2021)     rotigotine (NEUPRO) 2 MG/24HR Place 1 patch onto the skin daily. (Patient not taking: Reported on 06/23/2021) 30 patch 12   Current Facility-Administered Medications  Medication Dose Route Frequency Provider Last Rate Last Admin   Bevacizumab (AVASTIN) SOLN 1.25 mg  1.25 mg Intravitreal  [name], MD   1.25 mg at 11/01/18 2343   diphenhydrAMINE (BENADRYL) capsule 50 mg  50 mg Oral Once Logan Bores, MD        REVIEW OF SYSTEMS:   Constitutional: ( - ) fevers, ( - )  chills , ( - ) night sweats Eyes: ( - ) blurriness of vision, ( - ) double vision, ( - ) watery eyes Ears, nose, mouth, throat, and face: ( - ) mucositis, ( - ) sore throat Respiratory: ( - ) cough, ( - ) dyspnea, ( - ) wheezes Cardiovascular: ( - ) palpitation, ( - ) chest discomfort, ( - ) lower extremity swelling Gastrointestinal:  ( - ) nausea, ( - ) heartburn, ( - ) change in bowel habits Skin: ( - ) abnormal skin rashes Lymphatics: ( - ) new lymphadenopathy, ( - ) easy bruising Neurological: ( - ) numbness, ( - ) tingling, ( - ) new weaknesses Behavioral/Psych: ( - ) mood change, ( - ) new changes  All other systems were reviewed with the patient and are negative.  PHYSICAL EXAMINATION: ECOG PERFORMANCE STATUS: 2 - Symptomatic, <50% confined to bed  Vitals:   06/23/21 1435  BP: (!) 118/58  Pulse: 77  Resp: 18  Temp: 97.6 F (36.4 C)  SpO2: 98%   Filed Weights   06/23/21 1435  Weight: 184 lb 4.8 oz (83.6 kg)    GENERAL: well appearing appearing elderly Caucasian male in  NAD  SKIN: skin color, texture, turgor are normal, no rashes or significant lesions EYES: conjunctiva are pink and non-injected, sclera clear LUNGS: clear to auscultation and percussion with normal breathing effort HEART: regular rate & rhythm and no murmurs and no lower extremity edema Musculoskeletal: no cyanosis of  digits and no clubbing  PSYCH: alert & oriented x 3, fluent speech NEURO: no focal motor/sensory deficits  LABORATORY DATA:  I have reviewed the data as listed CBC Latest Ref Rng & Units 06/23/2021 05/14/2021 04/20/2021  WBC 4.0 - 10.5 K/uL 5.4 3.8 4.3  Hemoglobin 13.0 - 17.0 g/dL 10.9(L) 9.6(L) 11.0(L)  Hematocrit 39.0 - 52.0 % 33.1(L) 27.8(L) 32.9(L)  Platelets 150 - 400 K/uL 114(L) 121(L) 111(L)    CMP Latest Ref Rng & Units 06/23/2021 05/14/2021 04/20/2021  Glucose 70 - 99 mg/dL 105(H) 99 99  BUN 8 - 23 mg/dL 59(H) 39(H) 61(H)  Creatinine 0.61 - 1.24 mg/dL 2.18(H) 1.70(H) 2.11(H)  Sodium 135 - 145 mmol/L 143 139 139  Potassium 3.5 - 5.1 mmol/L 3.9 4.6 3.1(L)  Chloride 98 - 111 mmol/L 107 103 102  CO2 22 - 32 mmol/L _0 Calcium 8.9 - 10.3 mg/dL 10.4(H) 9.2 9.5  Total Protein 6.5 - 8.1 g/dL 7.6 - -  Total Bilirubin 0.3 - 1.2 mg/dL 0.6 - -  Alkaline Phos 38 - 126 U/L 185(H) - -  AST 15 - 41 U/L 21 - -  ALT 0 - 44 U/L 13 - -    RADIOGRAPHIC STUDIES: I have personally reviewed the radiological images as listed and agreed with the findings in the report. VAS US CAROTID  Result Date: 06/16/2021 Carotid Arterial Duplex Study Patient Name:  STYLES FAMBRO  Date of Exam:   06/16/2021 Medical Rec #: [id number]      Accession #:    [id number] Date of Birth: 1930/02/01      Patient Gender: M Patient Age:   78 years Exam Location:  [facility] Procedure:      VAS US CAROTID Referring Phys: [name] --------------------------------------------------------------------------------  Indications:  Carotid artery disease               Right endarterectomy, unknown date or location. Risk Factors: Hypertension. Performing Technologist: Ronal Fear RVS, RCS  Examination Guidelines: A complete evaluation includes B-mode imaging, spectral Doppler, color Doppler, and power Doppler as needed of all accessible portions of each vessel. Bilateral testing is considered an integral part  of a complete examination. Limited examinations for reoccurring indications may be performed as noted.  Right Carotid Findings: +----------+--------+--------+--------+------------------+--------+           PSV cm/sEDV cm/sStenosisPlaque DescriptionComments +----------+--------+--------+--------+------------------+--------+ CCA Prox  33      7                                          +----------+--------+--------+--------+------------------+--------+ CCA Mid   32      7                                          +----------+--------+--------+--------+------------------+--------+ CCA Distal38      9               heterogenous               +----------+--------+--------+--------+------------------+--------+  ICA Prox  37      11      Normal                             +----------+--------+--------+--------+------------------+--------+ ICA Mid   87      28                                         +----------+--------+--------+--------+------------------+--------+ ICA Distal76      21                                         +----------+--------+--------+--------+------------------+--------+ ECA       89                                                 +----------+--------+--------+--------+------------------+--------+ +----------+--------+-------+----------------+-------------------+           PSV cm/sEDV cmsDescribe        Arm Pressure (mmHG) +----------+--------+-------+----------------+-------------------+ Williams             Multiphasic, WNL                    +----------+--------+-------+----------------+-------------------+ +---------+--------+--------+--------+ VertebralPSV cm/sEDV cm/sAtypical +---------+--------+--------+--------+  Left Carotid Findings: +----------+--------+--------+--------+------------------+--------+           PSV cm/sEDV cm/sStenosisPlaque DescriptionComments  +----------+--------+--------+--------+------------------+--------+ CCA Prox  44      10                                         +----------+--------+--------+--------+------------------+--------+ CCA Mid   41      8                                          +----------+--------+--------+--------+------------------+--------+ CCA Distal39      9               heterogenous               +----------+--------+--------+--------+------------------+--------+ ICA Prox  39      9       1-39%   heterogenous               +----------+--------+--------+--------+------------------+--------+ ICA Mid   86      26                                         +----------+--------+--------+--------+------------------+--------+ ICA Distal83      22                                         +----------+--------+--------+--------+------------------+--------+ ECA       44                      heterogenous               +----------+--------+--------+--------+------------------+--------+ +----------+--------+--------+----------------+-------------------+  PSV cm/sEDV cm/sDescribe        Arm Pressure (mmHG) +----------+--------+--------+----------------+-------------------+ Subclavian114             Multiphasic, WNL                    +----------+--------+--------+----------------+-------------------+ +---------+--------+--------+--------------+ VertebralPSV cm/sEDV cm/sNot identified +---------+--------+--------+--------------+   Summary: Right Carotid: There is no evidence of stenosis in the right ICA. Left Carotid: Velocities in the left ICA are consistent with a 1-39% stenosis. Vertebrals:  Right vertebral artery demonstrates antegrade flow. Left vertebral              artery was not visualized. Subclavians: Normal flow hemodynamics were seen in bilateral subclavian              arteries. *See table(s) above for measurements and observations.  Electronically signed by  Harold Barban MD on 06/16/2021 at 5:52:00 PM.    Final     ASSESSMENT & PLAN Steven Williams 87 y.o. male with medical history significant for COPD, GERD, hypertension, hyperlipidemia, myelodysplastic syndrome, Paget's disease and recurrent nosebleeds who presents for evaluation of anemia and thrombocytopenia.  After review of the labs, review of the records, and discussion with the patient the patients findings are most consistent with anemia and thrombocytopenia of unclear etiology.  The patient does carry a reported history of myelodysplastic syndrome which fits well with his current findings.  We will need to try to track down where this diagnosis was made and if there is a bone marrow biopsy department.  His blood counts have been relatively stable over the last 2 years.  Today we will do further evaluation to assure that there is no nutritional deficiency or other possible causes for his findings.  #Macrocytic Anemia #Thrombocytopenia -- Today we will order nutritional studies to include vitamin B12, folate, methylmalonic acid, homocystine --We will work-up multiple myeloma with SPEP and serum free light chains --If no clear etiology can be found would recommend pursuing a bone marrow biopsy --Patient has reported prior history of myelodysplastic syndrome.  We need to further evaluate where this diagnosis came from and if it is based on bone marrow biopsy --Return to clinic pending the results of the above studies.  Orders Placed This Encounter  Procedures   CBC with Differential ([place] Only)    Standing Status:   Future    Number of Occurrences:   1    Standing Expiration Date:   06/23/2022   CMP ([place] only)    Standing Status:   Future    Number of Occurrences:   1    Standing Expiration Date:   06/23/2022   Ferritin    Standing Status:   Future    Number of Occurrences:   1    Standing Expiration Date:   06/23/2022   Iron and TIBC    Standing Status:   Future     Number of Occurrences:   1    Standing Expiration Date:   06/23/2022   Lactate dehydrogenase (LDH)    Standing Status:   Future    Number of Occurrences:   1    Standing Expiration Date:   06/23/2022   Retic Panel    Standing Status:   Future    Number of Occurrences:   1    Standing Expiration Date:   06/23/2022   TSH    Standing Status:   Future    Number of Occurrences:   1    Standing Expiration  Date:   06/23/2022   Vitamin B12    Standing Status:   Future    Number of Occurrences:   1    Standing Expiration Date:   06/23/2022   Folate, Serum    Standing Status:   Future    Number of Occurrences:   1    Standing Expiration Date:   06/23/2022   Homocysteine, serum    Standing Status:   Future    Number of Occurrences:   1    Standing Expiration Date:   06/23/2022   Methylmalonic acid, serum    Standing Status:   Future    Number of Occurrences:   1    Standing Expiration Date:   06/23/2022   Multiple Myeloma Panel (SPEP&IFE w/QIG)    Standing Status:   Future    Number of Occurrences:   1    Standing Expiration Date:   06/23/2022   Kappa/lambda light chains    Standing Status:   Future    Number of Occurrences:   1    Standing Expiration Date:   06/23/2022   Copper, serum    Standing Status:   Future    Number of Occurrences:   1    Standing Expiration Date:   06/23/2022    All questions were answered. The patient knows to call the clinic with any problems, questions or concerns.  A total of more than 60 minutes were spent on this encounter with face-to-face time and non-face-to-face time, including preparing to see the patient, ordering tests and/or medications, counseling the patient and coordination of care as outlined above.   Ledell Peoples, MD Department of Hematology/Oncology [place] at [facility] Phone: [phone] Pager: [phone] Email: [email or web address]  06/23/2021 5:13 PM

## 2021-06-24 LAB — IRON AND TIBC
Iron: 113 ug/dL (ref 42–163)
Saturation Ratios: 38 % (ref 20–55)
TIBC: 296 ug/dL (ref 202–409)
UIBC: 183 ug/dL (ref 117–376)

## 2021-06-24 LAB — KAPPA/LAMBDA LIGHT CHAINS
Kappa free light chain: 75.7 mg/L — ABNORMAL HIGH (ref 3.3–19.4)
Kappa, lambda light chain ratio: 1.93 — ABNORMAL HIGH (ref 0.26–1.65)
Lambda free light chains: 39.3 mg/L — ABNORMAL HIGH (ref 5.7–26.3)

## 2021-06-24 LAB — FERRITIN: Ferritin: 88 ng/mL (ref 24–336)

## 2021-06-24 LAB — COPPER, SERUM: Copper: 134 ug/dL — ABNORMAL HIGH (ref 69–132)

## 2021-06-24 LAB — HOMOCYSTEINE: Homocysteine: 25.6 umol/L — ABNORMAL HIGH (ref 0.0–21.3)

## 2021-06-24 LAB — TSH: TSH: 3.876 u[IU]/mL (ref 0.320–4.118)

## 2021-06-25 LAB — MULTIPLE MYELOMA PANEL, SERUM
Albumin SerPl Elph-Mcnc: 3.9 g/dL (ref 2.9–4.4)
Albumin/Glob SerPl: 1.6 (ref 0.7–1.7)
Alpha 1: 0.2 g/dL (ref 0.0–0.4)
Alpha2 Glob SerPl Elph-Mcnc: 0.6 g/dL (ref 0.4–1.0)
B-Globulin SerPl Elph-Mcnc: 0.8 g/dL (ref 0.7–1.3)
Gamma Glob SerPl Elph-Mcnc: 1 g/dL (ref 0.4–1.8)
Globulin, Total: 2.6 g/dL (ref 2.2–3.9)
IgA: 129 mg/dL (ref 61–437)
IgG (Immunoglobin G), Serum: 1045 mg/dL (ref 603–1613)
IgM (Immunoglobulin M), Srm: 99 mg/dL (ref 15–143)
Total Protein ELP: 6.5 g/dL (ref 6.0–8.5)

## 2021-06-25 LAB — METHYLMALONIC ACID, SERUM: Methylmalonic Acid, Quantitative: 496 nmol/L — ABNORMAL HIGH (ref 0–378)

## 2021-06-30 ENCOUNTER — Telehealth: Payer: Self-pay | Admitting: Internal Medicine

## 2021-06-30 NOTE — Telephone Encounter (Signed)
°  1. Has your device fired? no ° °2. Is you device beeping? no ° °3. Are you experiencing draining or swelling at device site?no ° °4. Are you calling to see if we received your device transmission?yes ° °5. Have you passed out? no ° ° ° °Please route to Device Clinic Pool °

## 2021-07-01 NOTE — Telephone Encounter (Signed)
I spoke with the patient and he states he did try to send the transmission but it was unsuccessful. I let him know that he has an older model monitor and it may need to be replace. He agreed to call tech support to get the monitor upgraded.

## 2021-07-02 ENCOUNTER — Other Ambulatory Visit: Payer: Self-pay

## 2021-07-02 ENCOUNTER — Ambulatory Visit (INDEPENDENT_AMBULATORY_CARE_PROVIDER_SITE_OTHER): Payer: Medicare Other | Admitting: Podiatry

## 2021-07-02 DIAGNOSIS — M79675 Pain in left toe(s): Secondary | ICD-10-CM

## 2021-07-02 DIAGNOSIS — I739 Peripheral vascular disease, unspecified: Secondary | ICD-10-CM | POA: Diagnosis not present

## 2021-07-02 DIAGNOSIS — B351 Tinea unguium: Secondary | ICD-10-CM | POA: Diagnosis not present

## 2021-07-02 DIAGNOSIS — M79674 Pain in right toe(s): Secondary | ICD-10-CM

## 2021-07-02 DIAGNOSIS — L84 Corns and callosities: Secondary | ICD-10-CM

## 2021-07-03 NOTE — Telephone Encounter (Signed)
I called the patient to follow up to see if he called tech support. He did not. I called [employer] on conference with the patient.  He is receiving a new monitor.

## 2021-07-07 ENCOUNTER — Encounter: Payer: Self-pay | Admitting: Podiatry

## 2021-07-07 NOTE — Progress Notes (Signed)
  Subjective:  Patient ID: Steven Williams, male    DOB: 1930-03-04,  MRN: [id number]  Steven Williams presents to clinic today for at risk foot care with h/o clotting disorder and PAD. He is seen for callus(es) right foot and painful thick toenails that are difficult to trim. Painful toenails interfere with ambulation. Aggravating factors include wearing enclosed shoe gear. Pain is relieved with periodic professional debridement. Painful calluses are aggravated when weightbearing with and without shoegear. Pain is relieved with periodic professional debridement.  Steven Williams voices no new pedal concerns on today's visit.  PCP is Ginger Organ., MD , and last visit was 06/23/2021.  Allergies  Allergen Reactions   Iodinated Diagnostic Agents Rash    Other reaction(s): rash    Review of Systems: Negative except as noted in the HPI. Objective:   Constitutional Steven Williams is a pleasant 87 y.o. Caucasian male, WD, WN in NAD. AAO x 3.   Vascular CFT <4 seconds b/l LE. Nonpalpable pedal pulses b/l. Pedal hair absent. Lower extremity skin temperature gradient warm to cool. No pain with calf compression b/l. No edema noted b/l LE. No cyanosis or clubbing noted.  Neurologic Normal speech. Oriented to person, place, and time. Protective sensation intact 5/5 intact bilaterally with 10g monofilament b/l. Vibratory sensation intact b/l. Proprioception intact bilaterally.  Dermatologic Pedal skin thin, shiny and atrophic b/l LE. No open wounds b/l LE. No interdigital macerations noted b/l LE. Toenails 1-5 b/l elongated, discolored, dystrophic, thickened, crumbly with subungual debris and tenderness to dorsal palpation. Hyperkeratotic lesion(s) submet head 1 right foot.  No erythema, no edema, no drainage, no fluctuance.  Orthopedic: Normal muscle strength 5/5 to all lower extremity muscle groups bilaterally. Hammertoe deformity noted 1-5 b/l.   Radiographs: None Assessment:  No diagnosis found. Plan:   Patient was evaluated and treated and all questions answered. Consent given for treatment as described below: -No new findings. No new orders. -Patient to continue soft, supportive shoe gear daily. -Toenails 1-5 left and 1-5 right debrided in length and girth without iatrogenic bleeding with sterile nail nipper and dremel.  -Callus(es) submet head 1 right foot pared utilizing sterile scalpel blade without complication or incident. Total number debrided =1. -Patient/POA to call should there be question/concern in the interim.  Return in about 3 months (around 10/02/2021).  Marzetta Board, DPM

## 2021-07-09 ENCOUNTER — Telehealth: Payer: Self-pay

## 2021-07-09 MED ORDER — VITAMIN B-12 1000 MCG PO TABS: 1000.0000 ug | ORAL_TABLET | Freq: Every day | ORAL | 3 refills | Status: DC

## 2021-07-09 NOTE — Telephone Encounter (Signed)
Called pt and advised of lab results.  Pt with VU.  Rx e scribed to [facility] per pt request.

## 2021-07-09 NOTE — Telephone Encounter (Signed)
-----   Message from Otila Kluver, RN sent at 07/09/2021  3:17 PM EDT -----  ----- Message ----- From: Orson Slick, MD Sent: 07/09/2021   9:03 AM EDT To: Otila Kluver, RN  Please let Steven Williams know that his labs are consistent with Vitamin b12 deficiency. Please call in Vitamin B12 1060mcg PO daily to a pharmacy of his choosing. We will see him back in Jan 2023.   ----- Message ----- From: Buel Ream, [facility] [place] Sent: 06/23/2021   4:15 PM EDT To: Orson Slick, MD

## 2021-07-15 NOTE — Progress Notes (Signed)
[facility] Note  07/16/2021     CHIEF COMPLAINT Patient presents for Retina Follow Up   HISTORY OF PRESENT ILLNESS: Steven Williams is a 87 y.o. male who presents to the clinic today for:   HPI     Retina Follow Up   Patient presents with  Wet AMD.  In both eyes.  Duration of 9 weeks.  Since onset it is stable.  I, the attending physician,  performed the HPI with the patient and updated documentation appropriately.        Comments   9 week follow up Exu ARMD OU-  Vision appears about the same as last visit OU.       Last edited by [name], MD on 07/16/2021 11:56 PM.     Referring physician:   HISTORICAL INFORMATION:   Selected notes from the [place] Referred by [name], [place]   CURRENT MEDICATIONS: Current Outpatient Medications (Ophthalmic Drugs)  Medication Sig   Ranibizumab (LUCENTIS) 0.3 MG/0.05ML SOLN every 8 (eight) weeks.   No current facility-administered medications for this visit. (Ophthalmic Drugs)   Current Outpatient Medications (Other)  Medication Sig   acetaminophen (TYLENOL) 500 MG tablet Take 1,000 mg by mouth daily.   allopurinol (ZYLOPRIM) 100 MG tablet Take 100 mg by mouth daily.   carvedilol (COREG) 6.25 MG tablet Take 6.25 mg by mouth 2 (two) times daily with a meal.   Cholecalciferol (VITAMIN D3 PO) Take 1 tablet by mouth in the morning and at bedtime.   dicyclomine (BENTYL) 10 MG capsule Take 30 mg by mouth in the morning and at bedtime.   digoxin (LANOXIN) 0.125 MG tablet Take 0.125 mg by mouth every Monday, Wednesday, and Friday.   Ferrous Sulfate (IRON) 325 (65 Fe) MG TABS Take 1 tablet by mouth 2 (two) times daily.   furosemide (LASIX) 40 MG tablet Take 40 mg by mouth daily.   hydrocortisone (ANUSOL-HC) 2.5 % rectal cream Apply 1 application topically 2 (two) times daily as needed for hemorrhoids or anal itching.   levothyroxine (SYNTHROID) 100 MCG tablet Take 100 mcg by mouth  every morning.   Misc Natural Products (GLUCOSAMINE CHOND COMPLEX/MSM PO) Take 1 tablet by mouth daily.   Multiple Vitamins-Minerals (PRESERVISION AREDS PO) Take 1 tablet by mouth 2 (two) times daily.   pantoprazole (PROTONIX) 40 MG tablet Take 40 mg by mouth daily.   pregabalin (LYRICA) 75 MG capsule Take 75 mg by mouth at bedtime.   rosuvastatin (CRESTOR) 10 MG tablet Take 10 mg by mouth daily.   saw palmetto 500 MG capsule Take 500 mg by mouth 2 (two) times daily.   tamsulosin (FLOMAX) 0.4 MG CAPS capsule Take 0.4 mg by mouth daily.   vitamin B-12 (CYANOCOBALAMIN) 1000 MCG tablet Take 1 tablet (1,000 mcg total) by mouth daily.   warfarin (COUMADIN) 2 MG tablet Take by mouth daily.   oxymetazoline (AFRIN 12 HOUR) 0.05 % nasal spray Place 2 sprays into both nostrils 2 (two) times daily as needed for congestion. (Patient not taking: No sig reported)   rotigotine (NEUPRO) 2 MG/24HR Place 1 patch onto the skin daily. (Patient not taking: No sig reported)   Current Facility-Administered Medications (Other)  Medication Route   Bevacizumab (AVASTIN) SOLN 1.25 mg Intravitreal   diphenhydrAMINE (BENADRYL) capsule 50 mg Oral    REVIEW OF SYSTEMS: ROS   Positive for: Genitourinary, Musculoskeletal, Endocrine, Cardiovascular, Eyes, Heme/Lymph Negative for: Constitutional, Gastrointestinal, Neurological, Skin, HENT, Respiratory, Psychiatric, Allergic/Imm  Last edited by Leonie Douglas, COA on 07/16/2021  1:27 PM.     ALLERGIES Allergies  Allergen Reactions   Iodinated Diagnostic Agents Rash    Other reaction(s): rash    PAST MEDICAL HISTORY Past Medical History:  Diagnosis Date   Anticoagulated 06/26/2020   Antiphospholipid antibody positive    Aortic aneurysm ([place])    thoracic, Dr Roxan Hockey   Aortic dissection [facility])    BPH (benign prostatic hyperplasia)    Central retinal artery occlusion    Chronic anemia    Chronic renal insufficiency    COPD (chronic obstructive pulmonary  disease) (HCC)    DDD (degenerative disc disease), cervical    DJD (degenerative joint disease)    DVT (deep venous thrombosis) (HCC)    Gastric dysmotility    GERD (gastroesophageal reflux disease)    Gout    HTN (hypertension)    Hyperlipidemia    Hypertensive retinopathy    OU   Hypothyroid    Macular degeneration    Myelodysplasia (myelodysplastic syndrome) ([place])    Nasal septal deviation 06/26/2020   Paget's disease of bone    Peripheral neuropathy    Permanent atrial fibrillation (HCC)    Recurrent epistaxis 06/26/2020   Rheumatoid arthritis (HCC)    Symptomatic bradycardia    TIA (transient ischemic attack)    Past Surgical History:  Procedure Laterality Date   ATRIAL FIBRILLATION ABLATION  09/23/2014   Dr Encarnacion Chu in [place]     lumbar, x 2   CAROTID ENDARTERECTOMY Right 1990   CAROTID STENT     CATARACT EXTRACTION Bilateral 2010   [facility]   EYE SURGERY Bilateral 2010   Cat Sx   LUNG DECORTICATION     VATS   PACEMAKER GENERATOR CHANGE  12/03/2011   MDT Adapta ADDR01 generator change by Dr Encarnacion Chu for symptomatic bradycardia   PACEMAKER IMPLANT  2004   MDT     FAMILY HISTORY Family History  Problem Relation Age of Onset   Cirrhosis Mother    Stroke Father    Lung cancer Sister    Lung cancer Brother     SOCIAL HISTORY Social History   Tobacco Use   Smoking status: Former    Packs/day: 2.00    Years: 12.00    Pack years: 24.00    Types: Cigarettes    Quit date: 09/07/1962    Years since quitting: 58.8   Smokeless tobacco: Never  Vaping Use   Vaping Use: Never used  Substance Use Topics   Alcohol use: Yes    Alcohol/week: 2.0 standard drinks    Types: 2 Glasses of wine per week    Comment: daily   Drug use: Never       OPHTHALMIC EXAM: Base Eye Exam     Visual Acuity (Snellen - Linear)       Right Left   Dist cc CF 2' 20/60 +2   Dist ph cc  20/50 +1         Tonometry (Tonopen, 1:36 PM)       Right Left   Pressure  12 13         Pupils       Dark Light Shape React APD   Right 2 1 Round Minimal None   Left 2 1 Round Minimal None         Visual Fields (Counting fingers)       Left Right    Full Full  Extraocular Movement       Right Left    Full Full         Neuro/Psych     Oriented x3: Yes   Mood/Affect: Normal         Dilation     Both eyes: 1.0% Mydriacyl, 2.5% Phenylephrine @ 1:36 PM           Slit Lamp and Fundus Exam     Slit Lamp Exam       Right Left   Lids/Lashes Dermatochalasis - upper lid, Meibomian gland dysfunction Dermatochalasis - upper lid, Meibomian gland dysfunction   Conjunctiva/Sclera White and quiet White and quiet   Cornea Arcus, 1-2+Punctate epithelial erosions, Well healed cataract wounds Arcus, 1-2+inferior Punctate epithelial erosions centrally, Well healed cataract wounds, mild endo pigment   Anterior Chamber Deep and quiet Deep and quiet   Iris Round and moderately dilated Round and moderately dilated   Lens Posterior chamber intraocular lens, 1+ non-central Posterior capsular opacification Posterior chamber intraocular lens, open PC   Vitreous Vitreous syneresis, Posterior vitreous detachment Vitreous syneresis         Fundus Exam       Right Left   Disc +cupping, trace pallor, thin superior rim, 360 PPA mild pallor, 360 PPA, +cupping   C/D Ratio 0.7 0.7   Macula Flat, diffuse RPE atrophy, central pigment clumping, No heme or edema Flat, Blunted foveal reflex, Drusen, RPE mottling, clumping and atrophy, focal IRH ST macula - resolved, No edema, trace cystic changes -- stably improved, peripapillary GA nasal macula   Vessels Vascular attenuation Vascular attenuation, Tortuous   Periphery Attached, mild reticular degeneration, mild paving stone inferiorly Attached, mild reticular degeneration            IMAGING AND PROCEDURES  Imaging and Procedures for @TODAY @  OCT, Retina - OU - Both Eyes       Right  Eye Quality was good. Central Foveal Thickness: 239. Progression has been stable. Findings include abnormal foveal contour, no IRF, no SRF, subretinal hyper-reflective material, retinal drusen , outer retinal atrophy, pigment epithelial detachment.   Left Eye Quality was good. Central Foveal Thickness: 285. Progression has been stable. Findings include abnormal foveal contour, no SRF, outer retinal atrophy, retinal drusen , pigment epithelial detachment, subretinal hyper-reflective material, no IRF (Stable improvement in cystic changes, persistent GA).   Notes *Images captured and stored on drive  Diagnosis / Impression:  Exudative ARMD OU OD: +atrophic macular scar - stable OS: Stable improvement in cystic changes, persistent GA  Clinical management:  See below  Abbreviations: NFP - Normal foveal profile. CME - cystoid macular edema. PED - pigment epithelial detachment. IRF - intraretinal fluid. SRF - subretinal fluid. EZ - ellipsoid zone. ERM - epiretinal membrane. ORA - outer retinal atrophy. ORT - outer retinal tubulation. SRHM - subretinal hyper-reflective material      Intravitreal Injection, Pharmacologic Agent - OS - Left Eye       Time Out 07/16/2021. 2:15 PM. Confirmed correct patient, procedure, site, and patient consented.   Anesthesia Topical anesthesia was used. Anesthetic medications included Lidocaine 2%, Proparacaine 0.5%.   Procedure Preparation included 5% betadine to ocular surface, eyelid speculum. A (32g) needle was used.   Injection: 2 mg aflibercept 2 MG/0.05ML   Route: Intravitreal, Site: Left Eye   NDC: [id number], Lot: [id number], Expiration date: 05/07/2022, Waste: 0.05 mL   Post-op Post injection exam found visual acuity of at least counting fingers. The patient tolerated the procedure well. There  were no complications. The patient received written and verbal post procedure care education. Post injection medications were not given.             ASSESSMENT/PLAN:    ICD-10-CM   1. Exudative age-related macular degeneration of left eye with active choroidal neovascularization (HCC)  H35.3221 Intravitreal Injection, Pharmacologic Agent - OS - Left Eye    aflibercept (EYLEA) SOLN 2 mg    2. Retinal edema  H35.81 OCT, Retina - OU - Both Eyes    3. Exudative age-related macular degeneration of right eye with inactive scar ([place])  H35.3213     4. Essential hypertension  I10     5. Hypertensive retinopathy of both eyes  H35.033     6. Pseudophakia of both eyes  Z96.1       1-3. 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    OD- w/ inactive scar -- history of low vision, CF 3'  OS- w/ CNV   - history of multiple IVL OS in [place], [place] w/ Dr. Doristine Church -- last injection 06/2018, q4-6 wk interval  - moved to [place] in November 2019 and was followed by Dr. Baird Cancer who had recommended observation, thus pt sought second opinion  - given functional monocular status and history of active CNV, has been receiving maintenance injections, s/p IVA #1 OS (02.25.20), #2 (04.07.20), #3 (05.05.20), #4 (06.22.20), #5 (08.03.20), #6 (03.22.21)  - s/p IVE OS #1 (09.14.20), #2 (10.23.20), #3 (11.30.20), #4 (01.11.21), #5 (02.15.21), #6 (03.22.21), #7 (05.03.21), #8 (06.16.21), #9 (07.26.21), #10 (09.07.21), #11 (10.19.21), #12 (12.01.2021), #13 (01.19.22), #14 (03.09.22), #15 (05.12.22), #16 (07.08.22), #17 (09.07.22)  - OCT shows PEDs, CNV w/ stable improvement in cystic changes OS at 9 wks; OD with inactive scar  - BCVA improved to 20/50 from 20/60 OS   - recommend IVE #18 OS today 11.09.22, maintenance w/ f/u 9 wks (slow tx and ext)  - pt prefers aggressive maintenance given functional monocular stutus  - pt wishes to be treated with IVE OS  - RBA of procedure discussed, questions answered  - informed consent obtained   - Eylea informed consent form signed and scanned on 01.11.2021  - see procedure note  - [email or web address] benefits investigation  started 9.14.20 -- approved for 2021  - f/u in 9 wks -- DFE, OCT, likely injection  4,5. Hypertensive retinopathy OU  - discussed importance of tight BP control  - monitor   6. Pseudophakia OU  - s/p CE/IOL OU  - monitor   Ophthalmic Meds Ordered this visit:  Meds ordered this encounter  Medications   aflibercept (EYLEA) SOLN 2 mg     Return in about 9 weeks (around 09/17/2021) for f/u exu ARMD OS, DFE, OCT.  There are no Patient Instructions on file for this visit.  This document serves as a record of services personally performed by Gardiner Sleeper, MD, PhD. It was created on their behalf by Orvan Falconer, an ophthalmic technician. The creation of this record is the provider's dictation and/or activities during the visit.    Electronically signed by: Orvan Falconer, OA, 07/16/21  11:58 PM   This document serves as a record of services personally performed by Gardiner Sleeper, MD, PhD. It was created on their behalf by San Jetty. Owens Shark, OA an ophthalmic technician. The creation of this record is the provider's dictation and/or activities during the visit.    Electronically signed by: San Jetty. Owens Shark, [place] 11.09.2022 11:58 PM  Gardiner Sleeper, M.D.,  Ph.D. Diseases & Surgery of the Retina and Vitreous [facility]  I have reviewed the above documentation for accuracy and completeness, and I agree with the above. Gardiner Sleeper, M.D., Ph.D. 07/17/21 12:00 AM  Abbreviations: M myopia (nearsighted); A astigmatism; H hyperopia (farsighted); P presbyopia; Mrx spectacle prescription;  CTL contact lenses; OD right eye; OS left eye; OU both eyes  XT exotropia; ET esotropia; PEK punctate epithelial keratitis; PEE punctate epithelial erosions; DES dry eye syndrome; MGD meibomian gland dysfunction; ATs artificial tears; PFAT's preservative free artificial tears; [place] nuclear sclerotic cataract; PSC posterior subcapsular cataract; ERM epi-retinal membrane; PVD posterior  vitreous detachment; RD retinal detachment; DM diabetes mellitus; DR diabetic retinopathy; NPDR non-proliferative diabetic retinopathy; PDR proliferative diabetic retinopathy; CSME clinically significant macular edema; DME diabetic macular edema; dbh dot blot hemorrhages; CWS cotton wool spot; POAG primary open angle glaucoma; C/D cup-to-disc ratio; HVF humphrey visual field; GVF goldmann visual field; OCT optical coherence tomography; IOP intraocular pressure; BRVO Branch retinal vein occlusion; CRVO central retinal vein occlusion; CRAO central retinal artery occlusion; BRAO branch retinal artery occlusion; RT retinal tear; SB scleral buckle; PPV pars plana vitrectomy; VH Vitreous hemorrhage; PRP panretinal laser photocoagulation; IVK intravitreal kenalog; VMT vitreomacular traction; MH Macular hole;  NVD neovascularization of the disc; NVE neovascularization elsewhere; AREDS age related eye disease study; ARMD age related macular degeneration; POAG primary open angle glaucoma; EBMD epithelial/anterior basement membrane dystrophy; ACIOL anterior chamber intraocular lens; IOL intraocular lens; PCIOL posterior chamber intraocular lens; Phaco/IOL phacoemulsification with intraocular lens placement; [place] photorefractive keratectomy; LASIK laser assisted in situ keratomileusis; HTN hypertension; DM diabetes mellitus; COPD chronic obstructive pulmonary disease

## 2021-07-16 ENCOUNTER — Encounter (INDEPENDENT_AMBULATORY_CARE_PROVIDER_SITE_OTHER): Payer: Self-pay | Admitting: Ophthalmology

## 2021-07-16 ENCOUNTER — Ambulatory Visit (INDEPENDENT_AMBULATORY_CARE_PROVIDER_SITE_OTHER): Payer: Medicare Other | Admitting: Ophthalmology

## 2021-07-16 ENCOUNTER — Other Ambulatory Visit: Payer: Self-pay

## 2021-07-16 DIAGNOSIS — I10 Essential (primary) hypertension: Secondary | ICD-10-CM

## 2021-07-16 DIAGNOSIS — H35033 Hypertensive retinopathy, bilateral: Secondary | ICD-10-CM | POA: Diagnosis not present

## 2021-07-16 DIAGNOSIS — Z961 Presence of intraocular lens: Secondary | ICD-10-CM

## 2021-07-16 DIAGNOSIS — H353221 Exudative age-related macular degeneration, left eye, with active choroidal neovascularization: Secondary | ICD-10-CM

## 2021-07-16 DIAGNOSIS — H3581 Retinal edema: Secondary | ICD-10-CM

## 2021-07-16 DIAGNOSIS — H353213 Exudative age-related macular degeneration, right eye, with inactive scar: Secondary | ICD-10-CM

## 2021-07-16 MED ORDER — AFLIBERCEPT 2MG/0.05ML IZ SOLN FOR KALEIDOSCOPE
2.0000 mg | INTRAVITREAL | Status: AC | PRN
  Administered 2021-07-16: 2 mg via INTRAVITREAL

## 2021-07-16 NOTE — Telephone Encounter (Signed)
LMOVM for patient to return my call. I was following up to see if he received his new monitor.

## 2021-07-18 NOTE — Telephone Encounter (Signed)
I spoke with the patient and he states the monitor is a little difficult for him. I asked him did he have time for me to help him set the monitor up? He states no. I gave him the device clinic number and told him either myself or Frances Furbish will be more than happy to help him set up the monitor. The patient thanked me for the call.

## 2021-09-10 ENCOUNTER — Ambulatory Visit (INDEPENDENT_AMBULATORY_CARE_PROVIDER_SITE_OTHER): Payer: Medicare Other

## 2021-09-10 ENCOUNTER — Other Ambulatory Visit: Payer: Self-pay

## 2021-09-10 DIAGNOSIS — I480 Paroxysmal atrial fibrillation: Secondary | ICD-10-CM

## 2021-09-10 LAB — CUP PACEART REMOTE DEVICE CHECK
Battery Impedance: 3135 Ohm
Battery Remaining Longevity: 23 mo
Battery Voltage: 2.72 V
Brady Statistic RV Percent Paced: 22 %
Date Time Interrogation Session: 20230104104942
Implantable Lead Implant Date: 20041008
Implantable Lead Implant Date: 20041008
Implantable Lead Location: 753859
Implantable Lead Location: 753860
Implantable Lead Model: 5076
Implantable Lead Model: 5076
Implantable Pulse Generator Implant Date: 20130328
Lead Channel Impedance Value: 67 Ohm
Lead Channel Impedance Value: 893 Ohm
Lead Channel Setting Pacing Amplitude: 2.5 V
Lead Channel Setting Pacing Pulse Width: 1 ms
Lead Channel Setting Sensing Sensitivity: 4 mV

## 2021-09-10 NOTE — Progress Notes (Signed)
Pateitn in office with [employer] device to set up.  Assisted patient with pairing device and sending transmission.  Confirmed transmission received.  Patient educated on future use and next scheduled transmisison/

## 2021-09-10 NOTE — Patient Instructions (Addendum)
Next Transmission due 09/29/21.

## 2021-09-16 NOTE — Progress Notes (Signed)
[facility] Note  09/17/2021     CHIEF COMPLAINT Patient presents for Retina Follow Up    HISTORY OF PRESENT ILLNESS: Steven Williams is a 88 y.o. male who presents to the clinic today for:   HPI     Retina Follow Up   Patient presents with  Wet AMD.  In both eyes.  This started years ago.  Severity is moderate.  Duration of 9 weeks.  I, the attending physician,  performed the HPI with the patient and updated documentation appropriately.        Comments   87 y/o male pt here for 9 wk f/u for exu ARMD OU.  No change in DVA OU.  Feels it is progressively harder to read, especially fine print.  Denies FOL, floaters.  OS has been sore to the touch for about 1 wk.  Pt suspects he may have gotten something in his left eye.  AT prn OU.      Last edited by [name], MD on 09/19/2021  4:08 PM.    Pt states he is having a hard time seeing, he is having to rely on voices to identify people  Referring physician:   HISTORICAL INFORMATION:   Selected notes from the [place] Referred by [name], [place]   CURRENT MEDICATIONS: Current Outpatient Medications (Ophthalmic Drugs)  Medication Sig   Ranibizumab (LUCENTIS) 0.3 MG/0.05ML SOLN every 8 (eight) weeks.   No current facility-administered medications for this visit. (Ophthalmic Drugs)   Current Outpatient Medications (Other)  Medication Sig   acetaminophen (TYLENOL) 500 MG tablet Take 1,000 mg by mouth daily.   allopurinol (ZYLOPRIM) 100 MG tablet Take 100 mg by mouth daily.   allopurinol (ZYLOPRIM) 100 MG tablet Take 1 tablet by mouth daily.   carvedilol (COREG) 6.25 MG tablet Take 6.25 mg by mouth 2 (two) times daily with a meal.   Cholecalciferol (VITAMIN D3 PO) Take 1 tablet by mouth in the morning and at bedtime.   dicyclomine (BENTYL) 10 MG capsule Take 30 mg by mouth in the morning and at bedtime.   digoxin (LANOXIN) 0.125 MG tablet Take 0.125 mg by mouth every  Monday, Wednesday, and Friday.   Ferrous Sulfate (IRON) 325 (65 Fe) MG TABS Take 1 tablet by mouth 2 (two) times daily.   fluticasone (FLONASE) 50 MCG/ACT nasal spray Administer 2 sprays in the right nostril daily.   furosemide (LASIX) 40 MG tablet Take 40 mg by mouth daily.   hydrocortisone (ANUSOL-HC) 2.5 % rectal cream Apply 1 application topically 2 (two) times daily as needed for hemorrhoids or anal itching.   levothyroxine (SYNTHROID) 100 MCG tablet Take 100 mcg by mouth every morning.   Misc Natural Products (GLUCOSAMINE CHOND COMPLEX/MSM PO) Take 1 tablet by mouth daily.   Multiple Vitamins-Minerals (PRESERVISION AREDS PO) Take 1 tablet by mouth 2 (two) times daily.   oxymetazoline (AFRIN 12 HOUR) 0.05 % nasal spray Place 2 sprays into both nostrils 2 (two) times daily as needed for congestion.   pantoprazole (PROTONIX) 40 MG tablet Take 40 mg by mouth daily.   pregabalin (LYRICA) 75 MG capsule Take 75 mg by mouth at bedtime.   rOPINIRole (REQUIP) 2 MG tablet Take 1/2 tablet po q hs   rosuvastatin (CRESTOR) 10 MG tablet Take 10 mg by mouth daily.   rotigotine (NEUPRO) 2 MG/24HR Place 1 patch onto the skin daily.   saw palmetto 500 MG capsule Take 500 mg by mouth  2 (two) times daily.   tamsulosin (FLOMAX) 0.4 MG CAPS capsule Take 0.4 mg by mouth daily.   vitamin B-12 (CYANOCOBALAMIN) 1000 MCG tablet Take 1 tablet (1,000 mcg total) by mouth daily.   warfarin (COUMADIN) 2 MG tablet Take by mouth daily.   Current Facility-Administered Medications (Other)  Medication Route   Bevacizumab (AVASTIN) SOLN 1.25 mg Intravitreal   diphenhydrAMINE (BENADRYL) capsule 50 mg Oral    REVIEW OF SYSTEMS: ROS   Positive for: Gastrointestinal, Genitourinary, Musculoskeletal, Cardiovascular, Eyes, Respiratory Negative for: Constitutional, Neurological, Skin, HENT, Endocrine, Psychiatric, Allergic/Imm, Heme/Lymph Last edited by Matthew Folks, COA on 09/17/2021  1:36 PM.      ALLERGIES Allergies   Allergen Reactions   Iodinated Contrast Media Rash    Other reaction(s): rash    PAST MEDICAL HISTORY Past Medical History:  Diagnosis Date   Anticoagulated 06/26/2020   Antiphospholipid antibody positive    Aortic aneurysm (HCC)    thoracic, Dr Roxan Hockey   Aortic dissection [facility])    BPH (benign prostatic hyperplasia)    Central retinal artery occlusion    Chronic anemia    Chronic renal insufficiency    COPD (chronic obstructive pulmonary disease) (HCC)    DDD (degenerative disc disease), cervical    DJD (degenerative joint disease)    DVT (deep venous thrombosis) (HCC)    Gastric dysmotility    GERD (gastroesophageal reflux disease)    Gout    HTN (hypertension)    Hyperlipidemia    Hypertensive retinopathy    OU   Hypothyroid    Macular degeneration    Myelodysplasia (myelodysplastic syndrome) (HCC)    Nasal septal deviation 06/26/2020   Paget's disease of bone    Peripheral neuropathy    Permanent atrial fibrillation (HCC)    Recurrent epistaxis 06/26/2020   Rheumatoid arthritis (HCC)    Symptomatic bradycardia    TIA (transient ischemic attack)    Past Surgical History:  Procedure Laterality Date   ATRIAL FIBRILLATION ABLATION  09/23/2014   Dr Encarnacion Chu in [place]     lumbar, x 2   CAROTID ENDARTERECTOMY Right 1990   CAROTID STENT     CATARACT EXTRACTION Bilateral 2010   [facility]   EYE SURGERY Bilateral 2010   [facility]   LUNG DECORTICATION     VATS   PACEMAKER GENERATOR CHANGE  12/03/2011   MDT Adapta [id number] generator change by Dr Encarnacion Chu for symptomatic bradycardia   PACEMAKER IMPLANT  2004   MDT    FAMILY HISTORY Family History  Problem Relation Age of Onset   Cirrhosis Mother    Stroke Father    Lung cancer Sister    Lung cancer Brother    SOCIAL HISTORY Social History   Tobacco Use   Smoking status: Former    Packs/day: 2.00    Years: 12.00    Pack years: 24.00    Types: Cigarettes    Quit date: 09/07/1962    Years since  quitting: 59.0   Smokeless tobacco: Never  Vaping Use   Vaping Use: Never used  Substance Use Topics   Alcohol use: Yes    Alcohol/week: 2.0 standard drinks    Types: 2 Glasses of wine per week    Comment: daily   Drug use: Never       OPHTHALMIC EXAM: Base Eye Exam     Visual Acuity (Snellen - Linear)       Right Left   Dist cc CF @ 2' 20/60 -2  Dist ph cc NI 20/50 -2    Correction: Glasses         Tonometry (Tonopen, 2:03 PM)       Right Left   Pressure 12 12         Pupils       Dark Light Shape React APD   Right 2 1 Round Minimal None   Left 2 1 Round Minimal None         Visual Fields (Counting fingers)       Left Right    Full Full         Extraocular Movement       Right Left    Full Full         Neuro/Psych     Oriented x3: Yes   Mood/Affect: Normal         Dilation     Both eyes: 1.0% Mydriacyl, 2.5% Phenylephrine @ 2:03 PM           Slit Lamp and Fundus Exam     Slit Lamp Exam       Right Left   Lids/Lashes Dermatochalasis - upper lid, Meibomian gland dysfunction Dermatochalasis - upper lid, Meibomian gland dysfunction   Conjunctiva/Sclera White and quiet White and quiet   Cornea Arcus, 1-2+Punctate epithelial erosions, Well healed cataract wounds Arcus, 1-2+inferior Punctate epithelial erosions centrally, Well healed cataract wounds, mild endo pigment   Anterior Chamber Deep and quiet Deep and quiet   Iris Round and moderately dilated Round and moderately dilated   Lens Posterior chamber intraocular lens, 1+ non-central Posterior capsular opacification Posterior chamber intraocular lens, open PC   Anterior Vitreous Vitreous syneresis, Posterior vitreous detachment Vitreous syneresis         Fundus Exam       Right Left   Disc +cupping, trace pallor, thin superior rim, 360 PPA mild pallor, 360 PPA, +cupping   C/D Ratio 0.7 0.7   Macula Flat, diffuse RPE atrophy, central pigment clumping, No heme or edema  Flat, Blunted foveal reflex, Drusen, RPE mottling, clumping and atrophy, No edema, trace cystic changes -- stably improved, peripapillary GA nasal macula, no heme   Vessels Vascular attenuation Vascular attenuation, Tortuous   Periphery Attached, mild reticular degeneration, mild paving stone inferiorly Attached, mild reticular degeneration           IMAGING AND PROCEDURES  Imaging and Procedures for @TODAY @  OCT, Retina - OU - Both Eyes       Right Eye Quality was good. Central Foveal Thickness: 215. Progression has been stable. Findings include abnormal foveal contour, no IRF, no SRF, subretinal hyper-reflective material, retinal drusen , outer retinal atrophy, pigment epithelial detachment.   Left Eye Quality was good. Central Foveal Thickness: 254. Progression has been stable. Findings include abnormal foveal contour, no SRF, outer retinal atrophy, retinal drusen , pigment epithelial detachment, subretinal hyper-reflective material, no IRF (Stable improvement in cystic changes, persistent GA).   Notes *Images captured and stored on drive  Diagnosis / Impression:  Exudative ARMD OU OD: +atrophic macular scar - stable OS: Stable improvement in cystic changes, persistent GA  Clinical management:  See below  Abbreviations: NFP - Normal foveal profile. CME - cystoid macular edema. PED - pigment epithelial detachment. IRF - intraretinal fluid. SRF - subretinal fluid. EZ - ellipsoid zone. ERM - epiretinal membrane. ORA - outer retinal atrophy. ORT - outer retinal tubulation. SRHM - subretinal hyper-reflective material      Intravitreal Injection, Pharmacologic Agent - OS -  Left Eye       Time Out 09/17/2021. 2:49 PM. Confirmed correct patient, procedure, site, and patient consented.   Anesthesia Topical anesthesia was used. Anesthetic medications included Lidocaine 2%, Proparacaine 0.5%.   Procedure Preparation included 5% betadine to ocular surface, eyelid speculum. A  (32g) needle was used.   Injection: 2 mg aflibercept 2 MG/0.05ML   Route: Intravitreal, Site: Left Eye   NDC: [id number], Lot: [id number], Expiration date: 07/08/2022, Waste: 0.05 mL   Post-op Post injection exam found visual acuity of at least counting fingers. The patient tolerated the procedure well. There were no complications. The patient received written and verbal post procedure care education. Post injection medications were not given.            ASSESSMENT/PLAN:    ICD-10-CM   1. Exudative age-related macular degeneration of left eye with active choroidal neovascularization (HCC)  H35.3221 OCT, Retina - OU - Both Eyes    Intravitreal Injection, Pharmacologic Agent - OS - Left Eye    aflibercept (EYLEA) SOLN 2 mg    2. Exudative age-related macular degeneration of right eye with inactive scar ([place])  H35.3213     3. Essential hypertension  I10     4. Hypertensive retinopathy of both eyes  H35.033     5. Pseudophakia of both eyes  Z96.1      1,2. 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    OD- w/ inactive scar -- history of low vision, CF 3'  OS- w/ CNV   - history of multiple IVL OS in [place], [place] w/ Dr. Doristine Church -- last injection 06/2018, q4-6 wk interval  - moved to [place] in November 2019 and was followed by Dr. Baird Cancer who had recommended observation, thus pt sought second opinion  - given functional monocular status and history of active CNV, has been receiving maintenance injections, s/p IVA #1 OS (02.25.20), #2 (04.07.20), #3 (05.05.20), #4 (06.22.20), #5 (08.03.20), #6 (03.22.21)  - s/p IVE OS #1 (09.14.20), #2 (10.23.20), #3 (11.30.20), #4 (01.11.21), #5 (02.15.21), #6 (03.22.21), #7 (05.03.21), #8 (06.16.21), #9 (07.26.21), #10 (09.07.21), #11 (10.19.21), #12 (12.01.2021), #13 (01.19.22), #14 (03.09.22), #15 (05.12.22), #16 (07.08.22), #17 (09.07.22), #18 (11.09.22)  - OCT shows PEDs, CNV w/ stable improvement in cystic changes OS at 9 wks; OD  with inactive scar  - BCVA stable at 20/50 OS   - recommend IVE #19 OS today 01.11.23, maintenance w/ ext to 10 wks (slow tx and ext)  - pt wishes to be treated with IVE OS  - RBA of procedure discussed, questions answered  - informed consent obtained   - Eylea informed consent form signed and scanned on 01.11.2021  - see procedure note  - [email or web address] benefits investigation started 9.14.20 -- approved for 2023  - f/u in 10 wks -- DFE, OCT, likely injection  3,4. Hypertensive retinopathy OU  - discussed importance of tight BP control  - monitor   5. Pseudophakia OU  - s/p CE/IOL OU  - monitor   Ophthalmic Meds Ordered this visit:  Meds ordered this encounter  Medications   aflibercept (EYLEA) SOLN 2 mg     Return in about 10 weeks (around 11/26/2021) for f/u exu ARMD OU, DFE, OCT.  There are no Patient Instructions on file for this visit.  This document serves as a record of services personally performed by Gardiner Sleeper, MD, PhD. It was created on their behalf by Orvan Falconer, an ophthalmic technician. The creation of this record is  the provider's dictation and/or activities during the visit.    Electronically signed by: Orvan Falconer, OA, 09/19/21  4:10 PM   This document serves as a record of services personally performed by Gardiner Sleeper, MD, PhD. It was created on their behalf by San Jetty. Owens Shark, OA an ophthalmic technician. The creation of this record is the provider's dictation and/or activities during the visit.    Electronically signed by: San Jetty. Owens Shark, [place] 01.11.2023 4:10 PM  Gardiner Sleeper, M.D., Ph.D. Diseases & Surgery of the Retina and Vitreous [facility]  I have reviewed the above documentation for accuracy and completeness, and I agree with the above. Gardiner Sleeper, M.D., Ph.D. 09/19/21 4:12 PM  Abbreviations: M myopia (nearsighted); A astigmatism; H hyperopia (farsighted); P presbyopia; Mrx spectacle prescription;  CTL  contact lenses; OD right eye; OS left eye; OU both eyes  XT exotropia; ET esotropia; PEK punctate epithelial keratitis; PEE punctate epithelial erosions; DES dry eye syndrome; MGD meibomian gland dysfunction; ATs artificial tears; PFAT's preservative free artificial tears; [place]na nuclear sclerotic cataract; PSC posterior subcapsular cataract; ERM epi-retinal membrane; PVD posterior vitreous detachment; RD retinal detachment; DM diabetes mellitus; DR diabetic retinopathy; NPDR non-proliferative diabetic retinopathy; PDR proliferative diabetic retinopathy; CSME clinically significant macular edema; DME diabetic macular edema; dbh dot blot hemorrhages; CWS cotton wool spot; POAG primary open angle glaucoma; C/D cup-to-disc ratio; HVF humphrey visual field; GVF goldmann visual field; OCT optical coherence tomography; IOP intraocular pressure; BRVO Branch retinal vein occlusion; CRVO central retinal vein occlusion; CRAO central retinal artery occlusion; BRAO branch retinal artery occlusion; RT retinal tear; SB scleral buckle; PPV pars plana vitrectomy; VH Vitreous hemorrhage; PRP panretinal laser photocoagulation; IVK intravitreal kenalog; VMT vitreomacular traction; MH Macular hole;  NVD neovascularization of the disc; NVE neovascularization elsewhere; AREDS age related eye disease study; ARMD age related macular degeneration; POAG primary open angle glaucoma; EBMD epithelial/anterior basement membrane dystrophy; ACIOL anterior chamber intraocular lens; IOL intraocular lens; PCIOL posterior chamber intraocular lens; Phaco/IOL phacoemulsification with intraocular lens placement; Philip photorefractive keratectomy; LASIK laser assisted in situ keratomileusis; HTN hypertension; DM diabetes mellitus; COPD chronic obstructive pulmonary disease

## 2021-09-17 ENCOUNTER — Ambulatory Visit (INDEPENDENT_AMBULATORY_CARE_PROVIDER_SITE_OTHER): Payer: Medicare Other | Admitting: Ophthalmology

## 2021-09-17 ENCOUNTER — Encounter (INDEPENDENT_AMBULATORY_CARE_PROVIDER_SITE_OTHER): Payer: Medicare Other | Admitting: Ophthalmology

## 2021-09-17 ENCOUNTER — Other Ambulatory Visit: Payer: Self-pay

## 2021-09-17 ENCOUNTER — Encounter (INDEPENDENT_AMBULATORY_CARE_PROVIDER_SITE_OTHER): Payer: Self-pay | Admitting: Ophthalmology

## 2021-09-17 DIAGNOSIS — H353221 Exudative age-related macular degeneration, left eye, with active choroidal neovascularization: Secondary | ICD-10-CM | POA: Diagnosis not present

## 2021-09-17 DIAGNOSIS — H35033 Hypertensive retinopathy, bilateral: Secondary | ICD-10-CM | POA: Diagnosis not present

## 2021-09-17 DIAGNOSIS — Z961 Presence of intraocular lens: Secondary | ICD-10-CM

## 2021-09-17 DIAGNOSIS — H353213 Exudative age-related macular degeneration, right eye, with inactive scar: Secondary | ICD-10-CM | POA: Diagnosis not present

## 2021-09-17 DIAGNOSIS — I10 Essential (primary) hypertension: Secondary | ICD-10-CM | POA: Diagnosis not present

## 2021-09-19 ENCOUNTER — Encounter (INDEPENDENT_AMBULATORY_CARE_PROVIDER_SITE_OTHER): Payer: Self-pay | Admitting: Ophthalmology

## 2021-09-19 DIAGNOSIS — H353221 Exudative age-related macular degeneration, left eye, with active choroidal neovascularization: Secondary | ICD-10-CM

## 2021-09-19 MED ORDER — AFLIBERCEPT 2MG/0.05ML IZ SOLN FOR KALEIDOSCOPE
2.0000 mg | INTRAVITREAL | Status: AC | PRN
  Administered 2021-09-19: 2 mg via INTRAVITREAL

## 2021-09-22 ENCOUNTER — Inpatient Hospital Stay: Payer: Medicare Other | Admitting: Hematology and Oncology

## 2021-09-22 ENCOUNTER — Other Ambulatory Visit: Payer: Self-pay | Admitting: Hematology and Oncology

## 2021-09-22 ENCOUNTER — Inpatient Hospital Stay: Payer: Medicare Other

## 2021-09-22 DIAGNOSIS — D696 Thrombocytopenia, unspecified: Secondary | ICD-10-CM

## 2021-09-22 NOTE — Progress Notes (Signed)
Remote pacemaker transmission.   

## 2021-10-08 ENCOUNTER — Ambulatory Visit: Payer: Medicare Other | Admitting: Podiatry

## 2021-10-13 ENCOUNTER — Ambulatory Visit: Payer: Medicare Other | Admitting: Podiatry

## 2021-10-13 ENCOUNTER — Encounter: Payer: Self-pay | Admitting: Podiatry

## 2021-10-13 ENCOUNTER — Other Ambulatory Visit: Payer: Self-pay

## 2021-10-13 ENCOUNTER — Ambulatory Visit (INDEPENDENT_AMBULATORY_CARE_PROVIDER_SITE_OTHER): Payer: Medicare Other | Admitting: Podiatry

## 2021-10-13 DIAGNOSIS — M79675 Pain in left toe(s): Secondary | ICD-10-CM | POA: Diagnosis not present

## 2021-10-13 DIAGNOSIS — M79674 Pain in right toe(s): Secondary | ICD-10-CM

## 2021-10-13 DIAGNOSIS — B351 Tinea unguium: Secondary | ICD-10-CM | POA: Diagnosis not present

## 2021-10-13 DIAGNOSIS — I739 Peripheral vascular disease, unspecified: Secondary | ICD-10-CM | POA: Diagnosis not present

## 2021-10-13 NOTE — Progress Notes (Signed)
[facility] Note  10/14/2021     CHIEF COMPLAINT Patient presents for Eye Problem    HISTORY OF PRESENT ILLNESS: Steven Williams is a 88 y.o. male who presents to the clinic today for:   HPI   When reading the newspaper with glasses or with even with magnifying glasses he is having difficulties seeing.  Even the TV is a little fuzzier than before.  Walking down the hall seeing someone at a distance.  He has to get close to the person to see who it is.  Some days are better than others.  He has been using Loteprednol Etabonate BID but has found he sees better when he doesn't use it.  He is using TheraTears and Refresh as well.  Last edited by Leonie Douglas, [place] on 10/14/2021  1:59 PM.     Pt is here early for a follow up for decreased VA OS, he states about 2 weeks ago, he noticed he was unable to read the newspaper, however, the next day vision seemed better, he was given Loteprednol Etabonate for dry eyes, he is also using Refresh when needed  Referring physician:   HISTORICAL INFORMATION:   Selected notes from the [facility] Referred by [name], [place]   CURRENT MEDICATIONS: Current Outpatient Medications (Ophthalmic Drugs)  Medication Sig   Ranibizumab (LUCENTIS) 0.3 MG/0.05ML SOLN every 8 (eight) weeks.   loteprednol (LOTEMAX) 0.5 % ophthalmic suspension SMARTSIG:In Eye(s) (Patient not taking: Reported on 10/14/2021)   No current facility-administered medications for this visit. (Ophthalmic Drugs)   Current Outpatient Medications (Other)  Medication Sig   acetaminophen (TYLENOL) 500 MG tablet Take 1,000 mg by mouth daily.   allopurinol (ZYLOPRIM) 100 MG tablet Take 100 mg by mouth daily.   carvedilol (COREG) 6.25 MG tablet Take 6.25 mg by mouth 2 (two) times daily with a meal.   Cholecalciferol (VITAMIN D3 PO) Take 1 tablet by mouth in the morning and at bedtime.   dicyclomine (BENTYL) 10 MG capsule Take 30 mg by mouth in the  morning and at bedtime.   digoxin (LANOXIN) 0.125 MG tablet Take 0.125 mg by mouth every Monday, Wednesday, and Friday.   Ferrous Sulfate (IRON) 325 (65 Fe) MG TABS Take 1 tablet by mouth 2 (two) times daily.   fluticasone (FLONASE) 50 MCG/ACT nasal spray Administer 2 sprays in the right nostril daily.   furosemide (LASIX) 40 MG tablet Take 40 mg by mouth daily.   hydrocortisone (ANUSOL-HC) 2.5 % rectal cream Apply 1 application topically 2 (two) times daily as needed for hemorrhoids or anal itching.   levothyroxine (SYNTHROID) 100 MCG tablet Take 100 mcg by mouth every morning.   Misc Natural Products (GLUCOSAMINE CHOND COMPLEX/MSM PO) Take 1 tablet by mouth daily.   Multiple Vitamins-Minerals (PRESERVISION AREDS PO) Take 1 tablet by mouth 2 (two) times daily.   oxymetazoline (AFRIN 12 HOUR) 0.05 % nasal spray Place 2 sprays into both nostrils 2 (two) times daily as needed for congestion.   pantoprazole (PROTONIX) 40 MG tablet Take 40 mg by mouth daily.   pregabalin (LYRICA) 75 MG capsule Take 75 mg by mouth at bedtime.   rOPINIRole (REQUIP) 2 MG tablet Take 1/2 tablet po q hs   rosuvastatin (CRESTOR) 10 MG tablet Take 10 mg by mouth daily.   rotigotine (NEUPRO) 2 MG/24HR Place 1 patch onto the skin daily.   saw palmetto 500 MG capsule Take 500 mg by mouth 2 (two) times daily.  tamsulosin (FLOMAX) 0.4 MG CAPS capsule Take 0.4 mg by mouth daily.   vitamin B-12 (CYANOCOBALAMIN) 1000 MCG tablet Take 1 tablet (1,000 mcg total) by mouth daily.   warfarin (COUMADIN) 2 MG tablet Take by mouth daily.   allopurinol (ZYLOPRIM) 100 MG tablet Take 1 tablet by mouth daily.   Current Facility-Administered Medications (Other)  Medication Route   Bevacizumab (AVASTIN) SOLN 1.25 mg Intravitreal   diphenhydrAMINE (BENADRYL) capsule 50 mg Oral   REVIEW OF SYSTEMS: ROS   Positive for: Gastrointestinal, Genitourinary, Musculoskeletal, Cardiovascular, Eyes, Respiratory Negative for: Constitutional,  Neurological, Skin, HENT, Endocrine, Psychiatric, Allergic/Imm, Heme/Lymph Last edited by Leonie Douglas, [place] on 10/14/2021  1:57 PM.     ALLERGIES Allergies  Allergen Reactions   Iodinated Contrast Media Rash    Other reaction(s): rash   PAST MEDICAL HISTORY Past Medical History:  Diagnosis Date   Anticoagulated 06/26/2020   Antiphospholipid antibody positive    Aortic aneurysm (HCC)    thoracic, Dr Roxan Hockey   Aortic dissection [facility])    BPH (benign prostatic hyperplasia)    Central retinal artery occlusion    Chronic anemia    Chronic renal insufficiency    COPD (chronic obstructive pulmonary disease) (HCC)    DDD (degenerative disc disease), cervical    DJD (degenerative joint disease)    DVT (deep venous thrombosis) (HCC)    Gastric dysmotility    GERD (gastroesophageal reflux disease)    Gout    HTN (hypertension)    Hyperlipidemia    Hypertensive retinopathy    OU   Hypothyroid    Macular degeneration    Myelodysplasia (myelodysplastic syndrome) (HCC)    Nasal septal deviation 06/26/2020   Paget's disease of bone    Peripheral neuropathy    Permanent atrial fibrillation (HCC)    Recurrent epistaxis 06/26/2020   Rheumatoid arthritis (HCC)    Symptomatic bradycardia    TIA (transient ischemic attack)    Past Surgical History:  Procedure Laterality Date   ATRIAL FIBRILLATION ABLATION  09/23/2014   Dr Encarnacion Chu in [place]     lumbar, x 2   CAROTID ENDARTERECTOMY Right 1990   CAROTID STENT     CATARACT EXTRACTION Bilateral 2010   [place]   EYE SURGERY Bilateral 2010   [facility]   LUNG DECORTICATION     VATS   PACEMAKER GENERATOR CHANGE  12/03/2011   MDT Adapta [id number] generator change by Dr Encarnacion Chu for symptomatic bradycardia   PACEMAKER IMPLANT  2004   MDT    FAMILY HISTORY Family History  Problem Relation Age of Onset   Cirrhosis Mother    Stroke Father    Lung cancer Sister    Lung cancer Brother    SOCIAL HISTORY Social History    Tobacco Use   Smoking status: Former    Packs/day: 2.00    Years: 12.00    Pack years: 24.00    Types: Cigarettes    Quit date: 09/07/1962    Years since quitting: 59.1   Smokeless tobacco: Never  Vaping Use   Vaping Use: Never used  Substance Use Topics   Alcohol use: Yes    Alcohol/week: 2.0 standard drinks    Types: 2 Glasses of wine per week    Comment: daily   Drug use: Never       OPHTHALMIC EXAM: Base Eye Exam     Visual Acuity (Snellen - Linear)       Right Left   Dist cc CF 18'  20/200 -1   Dist ph cc  NI    Correction: Glasses         Tonometry (Tonopen, 2:11 PM)       Right Left   Pressure 12 12         Pupils       Dark Light Shape React APD   Right 2 1 Round Minimal None   Left 2 1 Round Minimal None         Visual Fields (Counting fingers)       Left Right     Full   Restrictions Partial outer inferior temporal deficiency          Extraocular Movement       Right Left    Full Full         Neuro/Psych     Oriented x3: Yes   Mood/Affect: Normal         Dilation     Both eyes: 1.0% Mydriacyl, 2.5% Phenylephrine @ 2:11 PM           Slit Lamp and Fundus Exam     Slit Lamp Exam       Right Left   Lids/Lashes Dermatochalasis - upper lid, Meibomian gland dysfunction Dermatochalasis - upper lid, Meibomian gland dysfunction   Conjunctiva/Sclera White and quiet White and quiet   Cornea Arcus, 3+Punctate epithelial erosions, Well healed cataract wounds Arcus, 3-4+inferior Punctate epithelial erosions centrally, Well healed cataract wounds, mild endo pigment   Anterior Chamber Deep and quiet Deep and quiet   Iris Round and moderately dilated Round and moderately dilated   Lens Posterior chamber intraocular lens, 1+ non-central Posterior capsular opacification Posterior chamber intraocular lens, open PC   Anterior Vitreous Vitreous syneresis, Posterior vitreous detachment Vitreous syneresis         Fundus Exam        Right Left   Disc +cupping, trace pallor, thin superior rim, 360 PPA mild pallor, 360 PPA, +cupping   C/D Ratio 0.7 0.7   Macula Flat, diffuse RPE atrophy, central pigment clumping, No heme or edema Flat, Blunted foveal reflex, Drusen, RPE mottling, clumping and atrophy, No edema, trace cystic changes -- stably improved, peripapillary GA nasal macula, no heme   Vessels Vascular attenuation Vascular attenuation, Tortuous   Periphery Attached, mild reticular degeneration, mild paving stone inferiorly Attached, mild reticular degeneration           Refraction     Wearing Rx       Sphere Cylinder Axis Add   Right -1.75 +2.25 005 +2.50   Left -1.50 +2.50 175 +2.50    Type: PAL         Manifest Refraction       Sphere Cylinder Axis Dist VA   Right       Left -2.00 +2.50 175 20/100+1           IMAGING AND PROCEDURES  Imaging and Procedures for @TODAY @  OCT, Retina - OU - Both Eyes       Right Eye Quality was good. Central Foveal Thickness: 204. Progression has been stable. Findings include abnormal foveal contour, no IRF, no SRF, subretinal hyper-reflective material, retinal drusen , outer retinal atrophy, pigment epithelial detachment, inner retinal atrophy.   Left Eye Quality was good. Central Foveal Thickness: 272. Progression has been stable. Findings include abnormal foveal contour, no SRF, outer retinal atrophy, retinal drusen , pigment epithelial detachment, subretinal hyper-reflective material, no IRF (Stable improvement in cystic changes, persistent GA --  mild progression of central ORA/ellipsoid loss).   Notes *Images captured and stored on drive  Diagnosis / Impression:  Exudative ARMD OU OD: +atrophic macular scar - stable OS: Stable improvement in cystic changes, persistent GA -- mild progression of central ORA/ellipsoid loss  Clinical management:  See below  Abbreviations: NFP - Normal foveal profile. CME - cystoid macular edema. PED - pigment  epithelial detachment. IRF - intraretinal fluid. SRF - subretinal fluid. EZ - ellipsoid zone. ERM - epiretinal membrane. ORA - outer retinal atrophy. ORT - outer retinal tubulation. SRHM - subretinal hyper-reflective material            ASSESSMENT/PLAN:    ICD-10-CM   1. Exudative age-related macular degeneration of left eye with active choroidal neovascularization (HCC)  H35.3221 OCT, Retina - OU - Both Eyes    2. Exudative age-related macular degeneration of right eye with inactive scar ([place])  H35.3213     3. Essential hypertension  I10     4. Hypertensive retinopathy of both eyes  H35.033     5. Pseudophakia of both eyes  Z96.1     6. Dry eyes  H04.123      **Pt presents for urgent f/u -- decreased vision OS, intermittent**  - pt reports 1-2 wk history of intermittent decreased vision OS --unable to read at times despite reading glasses and magnifiers  - BCVA 20/200 OS from 20/50  - OCT shows relatively stable ARMD  - exam shows severe dry eyes OU  - see plan below  1,2. 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    OD- w/ inactive scar -- history of low vision, CF 3'  OS- w/ CNV   - history of multiple IVL OS in [place], [place] w/ Dr. Doristine Church -- last injection 06/2018, q4-6 wk interval  - moved to [place] in November 2019 and was followed by Dr. Baird Cancer who had recommended observation, thus pt sought second opinion  - given functional monocular status and history of active CNV, has been receiving maintenance injections, s/p IVA #1 OS (02.25.20), #2 (04.07.20), #3 (05.05.20), #4 (06.22.20), #5 (08.03.20), #6 (03.22.21)  - s/p IVE OS #1 (09.14.20), #2 (10.23.20), #3 (11.30.20), #4 (01.11.21), #5 (02.15.21), #6 (03.22.21), #7 (05.03.21), #8 (06.16.21), #9 (07.26.21), #10 (09.07.21), #11 (10.19.21), #12 (12.01.2021), #13 (01.19.22), #14 (03.09.22), #15 (05.12.22), #16 (07.08.22), #17 (09.07.22), #18 (11.09.22), #19 (01.11.23)  - OCT shows Stable improvement in cystic  changes, persistent GA -- mild progression of central ORA/ellipsoid loss 4 wks; OD with inactive scar  - BCVA decreased to 20/200 from 20/50 OS   - [employer] benefits investigation started 9.14.20 -- approved for 2023  - f/u as scheduled -- DFE, OCT, likely injection  3,4. Hypertensive retinopathy OU  - discussed importance of tight BP control  - monitor   5. Pseudophakia OU  - s/p CE/IOL OU  - monitor   6. Dry eyes OU  - 2-3+ PEE OU (OS > OD)  - pt using AT ~BID OU - recommend inc artificial tears to 4-6x/day - lubricating ointment at night - f/u 2-3 weeks for K check   Ophthalmic Meds Ordered this visit:  No orders of the defined types were placed in this encounter.    Return for f/u 2-3 weeks, dry eye and vision check OS, DFE, OCT.  There are no Patient Instructions on file for this visit.  This document serves as a record of services personally performed by Gardiner Sleeper, MD, PhD. It was created on their  behalf by San Jetty. Owens Shark, OA an ophthalmic technician. The creation of this record is the provider's dictation and/or activities during the visit.    Electronically signed by: San Jetty. Owens Shark, [place] 02.06.2023 4:21 PM   Gardiner Sleeper, M.D., Ph.D. Diseases & Surgery of the Retina and Vitreous [facility]  I have reviewed the above documentation for accuracy and completeness, and I agree with the above. Gardiner Sleeper, M.D., Ph.D. 10/15/21 4:22 PM   Abbreviations: M myopia (nearsighted); A astigmatism; H hyperopia (farsighted); P presbyopia; Mrx spectacle prescription;  CTL contact lenses; OD right eye; OS left eye; OU both eyes  XT exotropia; ET esotropia; PEK punctate epithelial keratitis; PEE punctate epithelial erosions; DES dry eye syndrome; MGD meibomian gland dysfunction; ATs artificial tears; PFAT's preservative free artificial tears; [place] nuclear sclerotic cataract; PSC posterior subcapsular cataract; ERM epi-retinal membrane; PVD posterior  vitreous detachment; RD retinal detachment; DM diabetes mellitus; DR diabetic retinopathy; NPDR non-proliferative diabetic retinopathy; PDR proliferative diabetic retinopathy; CSME clinically significant macular edema; DME diabetic macular edema; dbh dot blot hemorrhages; CWS cotton wool spot; POAG primary open angle glaucoma; C/D cup-to-disc ratio; HVF humphrey visual field; GVF goldmann visual field; OCT optical coherence tomography; IOP intraocular pressure; BRVO Branch retinal vein occlusion; CRVO central retinal vein occlusion; CRAO central retinal artery occlusion; BRAO branch retinal artery occlusion; RT retinal tear; SB scleral buckle; PPV pars plana vitrectomy; VH Vitreous hemorrhage; PRP panretinal laser photocoagulation; IVK intravitreal kenalog; VMT vitreomacular traction; MH Macular hole;  NVD neovascularization of the disc; NVE neovascularization elsewhere; AREDS age related eye disease study; ARMD age related macular degeneration; POAG primary open angle glaucoma; EBMD epithelial/anterior basement membrane dystrophy; ACIOL anterior chamber intraocular lens; IOL intraocular lens; PCIOL posterior chamber intraocular lens; Phaco/IOL phacoemulsification with intraocular lens placement; [place] photorefractive keratectomy; LASIK laser assisted in situ keratomileusis; HTN hypertension; DM diabetes mellitus; COPD chronic obstructive pulmonary disease

## 2021-10-14 ENCOUNTER — Encounter (INDEPENDENT_AMBULATORY_CARE_PROVIDER_SITE_OTHER): Payer: Self-pay | Admitting: Ophthalmology

## 2021-10-14 ENCOUNTER — Ambulatory Visit (INDEPENDENT_AMBULATORY_CARE_PROVIDER_SITE_OTHER): Payer: Medicare Other | Admitting: Ophthalmology

## 2021-10-14 DIAGNOSIS — H353221 Exudative age-related macular degeneration, left eye, with active choroidal neovascularization: Secondary | ICD-10-CM

## 2021-10-14 DIAGNOSIS — H35033 Hypertensive retinopathy, bilateral: Secondary | ICD-10-CM

## 2021-10-14 DIAGNOSIS — H353213 Exudative age-related macular degeneration, right eye, with inactive scar: Secondary | ICD-10-CM

## 2021-10-14 DIAGNOSIS — I10 Essential (primary) hypertension: Secondary | ICD-10-CM

## 2021-10-14 DIAGNOSIS — H04123 Dry eye syndrome of bilateral lacrimal glands: Secondary | ICD-10-CM

## 2021-10-14 DIAGNOSIS — Z961 Presence of intraocular lens: Secondary | ICD-10-CM

## 2021-10-15 ENCOUNTER — Inpatient Hospital Stay: Payer: Medicare Other

## 2021-10-15 ENCOUNTER — Inpatient Hospital Stay: Payer: Medicare Other | Admitting: Hematology and Oncology

## 2021-10-15 ENCOUNTER — Encounter (INDEPENDENT_AMBULATORY_CARE_PROVIDER_SITE_OTHER): Payer: Self-pay | Admitting: Ophthalmology

## 2021-10-18 DIAGNOSIS — L97919 Non-pressure chronic ulcer of unspecified part of right lower leg with unspecified severity: Secondary | ICD-10-CM | POA: Insufficient documentation

## 2021-10-18 DIAGNOSIS — I1310 Hypertensive heart and chronic kidney disease without heart failure, with stage 1 through stage 4 chronic kidney disease, or unspecified chronic kidney disease: Secondary | ICD-10-CM | POA: Insufficient documentation

## 2021-10-18 DIAGNOSIS — G459 Transient cerebral ischemic attack, unspecified: Secondary | ICD-10-CM | POA: Insufficient documentation

## 2021-10-18 DIAGNOSIS — R2681 Unsteadiness on feet: Secondary | ICD-10-CM | POA: Insufficient documentation

## 2021-10-18 DIAGNOSIS — M5416 Radiculopathy, lumbar region: Secondary | ICD-10-CM | POA: Insufficient documentation

## 2021-10-18 DIAGNOSIS — I87333 Chronic venous hypertension (idiopathic) with ulcer and inflammation of bilateral lower extremity: Secondary | ICD-10-CM | POA: Insufficient documentation

## 2021-10-18 NOTE — Progress Notes (Signed)
Subjective: Steven Williams is a 88 y.o. male patient seen today for follow up of  for at risk foot care. Patient has h/o PAD and painful elongated mycotic toenails 1-5 bilaterally which are tender when wearing enclosed shoe gear. Pain is relieved with periodic professional debridement..   New problem(s)/concern(s) today: None    PCP is Ginger Organ., MD. Last visit was: 09/03/2021.  Allergies  Allergen Reactions   Iodinated Contrast Media Rash    Other reaction(s): rash    Objective: Physical Exam  General: Patient is a pleasant 88 y.o. Caucasian male WD, WN in NAD. AAO x 3.   Neurovascular Examination: CFT <4 seconds b/l LE. Diminished pedal pulses b/l. Pedal hair absent b/l. Skin temperature gradient warm to cool b/l. No pain with calf compression b/l. No cyanosis or clubbing noted b/l LE.  Protective sensation intact 5/5 intact bilaterally with 10g monofilament b/l. Vibratory sensation intact b/l.  Dermatological:  Pedal skin thin, shiny and atrophic b/l LE.  No open wounds b/l. No interdigital macerations b/l. Toenails 1-5 b/l elongated, thickened, discolored with subungual debris. +Tenderness with dorsal palpation of nailplates. No hyperkeratotic or porokeratotic lesions present.  Musculoskeletal:  Normal muscle strength 5/5 to all lower extremity muscle groups bilaterally. Hammertoe deformity noted 1-5 b/l. Pes planovalgus deformity noted b/l lower extremities.. No pain, crepitus or joint limitation noted with ROM b/l LE.  Patient ambulates independently without assistive aids.  Assessment: 1. Pain due to onychomycosis of toenails of both feet   2. PAD (peripheral artery disease) ([place])    Plan: Patient was evaluated and treated and all questions answered. Consent given for treatment as described below: -Toenails 1-5 bilaterally were debrided in length and girth with sterile nail nippers and dremel. Pinpoint bleeding of R 5th toe addressed with Lumicain Hemostatic  Solution, cleansed with alcohol. triple antibiotic ointment applied. Patient instructed to remove band-aid on tomorrow. No further treatment required by patient. -Patient/POA to call should there be question/concern in the interim.  Return in about 3 months (around 01/10/2022).  Marzetta Board, DPM

## 2021-10-24 ENCOUNTER — Other Ambulatory Visit: Payer: Self-pay | Admitting: Thoracic Surgery (Cardiothoracic Vascular Surgery)

## 2021-10-24 DIAGNOSIS — I7123 Aneurysm of the descending thoracic aorta, without rupture: Secondary | ICD-10-CM

## 2021-10-29 ENCOUNTER — Inpatient Hospital Stay: Payer: Medicare Other

## 2021-10-29 ENCOUNTER — Inpatient Hospital Stay: Payer: Medicare Other | Admitting: Hematology and Oncology

## 2021-11-03 NOTE — Progress Notes (Signed)
[facility] Note  11/05/2021     CHIEF COMPLAINT Patient presents for Retina Follow Up    HISTORY OF PRESENT ILLNESS: Steven Williams is a 88 y.o. male who presents to the clinic today for:   HPI     Retina Follow Up   Patient presents with  Wet AMD.  In left eye.  Severity is moderate.  Duration of 3 weeks.  Since onset it is stable.  I, the attending physician,  performed the HPI with the patient and updated documentation appropriately.        Comments   Pt here for 3 wk ret f/u for dry eye check/K check OU. Pt states VA is about the same, no change. He does report blurriness improvement since beginning use of the art tears.       Last edited by [name], MD on 11/06/2021  1:23 AM.    Pt reports improvement in vision since starting AT's, he uses Refresh 4-5 times a day and Refresh PM at bedtime  Referring physician:   HISTORICAL INFORMATION:   Selected notes from the MEDICAL RECORD NUMBER Referred by [name], [place]   CURRENT MEDICATIONS: Current Outpatient Medications (Ophthalmic Drugs)  Medication Sig   loteprednol (LOTEMAX) 0.5 % ophthalmic suspension    Ranibizumab (LUCENTIS) 0.3 MG/0.05ML SOLN every 8 (eight) weeks.   No current facility-administered medications for this visit. (Ophthalmic Drugs)   Current Outpatient Medications (Other)  Medication Sig   acetaminophen (TYLENOL) 500 MG tablet Take 1,000 mg by mouth daily.   allopurinol (ZYLOPRIM) 100 MG tablet Take 100 mg by mouth daily.   allopurinol (ZYLOPRIM) 100 MG tablet Take 1 tablet by mouth daily.   carvedilol (COREG) 6.25 MG tablet Take 6.25 mg by mouth 2 (two) times daily with a meal.   Cholecalciferol (VITAMIN D3 PO) Take 1 tablet by mouth in the morning and at bedtime.   dicyclomine (BENTYL) 10 MG capsule Take 30 mg by mouth in the morning and at bedtime.   digoxin (LANOXIN) 0.125 MG tablet Take 0.125 mg by mouth every Monday, Wednesday, and Friday.    Ferrous Sulfate (IRON) 325 (65 Fe) MG TABS Take 1 tablet by mouth 2 (two) times daily.   fluticasone (FLONASE) 50 MCG/ACT nasal spray Administer 2 sprays in the right nostril daily.   furosemide (LASIX) 40 MG tablet Take 40 mg by mouth daily.   hydrocortisone (ANUSOL-HC) 2.5 % rectal cream Apply 1 application topically 2 (two) times daily as needed for hemorrhoids or anal itching.   levothyroxine (SYNTHROID) 100 MCG tablet Take 100 mcg by mouth every morning.   Misc Natural Products (GLUCOSAMINE CHOND COMPLEX/MSM PO) Take 1 tablet by mouth daily.   Multiple Vitamins-Minerals (PRESERVISION AREDS PO) Take 1 tablet by mouth 2 (two) times daily.   oxymetazoline (AFRIN 12 HOUR) 0.05 % nasal spray Place 2 sprays into both nostrils 2 (two) times daily as needed for congestion.   pantoprazole (PROTONIX) 40 MG tablet Take 40 mg by mouth daily.   pregabalin (LYRICA) 75 MG capsule Take 75 mg by mouth at bedtime.   rOPINIRole (REQUIP) 2 MG tablet Take 1/2 tablet po q hs   rosuvastatin (CRESTOR) 10 MG tablet Take 10 mg by mouth daily.   rotigotine (NEUPRO) 2 MG/24HR Place 1 patch onto the skin daily.   saw palmetto 500 MG capsule Take 500 mg by mouth 2 (two) times daily.   tamsulosin (FLOMAX) 0.4 MG CAPS capsule Take 0.4 mg  by mouth daily.   vitamin B-12 (CYANOCOBALAMIN) 1000 MCG tablet Take 1 tablet (1,000 mcg total) by mouth daily.   warfarin (COUMADIN) 2 MG tablet Take by mouth daily.   Current Facility-Administered Medications (Other)  Medication Route   Bevacizumab (AVASTIN) SOLN 1.25 mg Intravitreal   diphenhydrAMINE (BENADRYL) capsule 50 mg Oral   REVIEW OF SYSTEMS: ROS   Positive for: Gastrointestinal, Genitourinary, Musculoskeletal, Cardiovascular, Eyes, Respiratory Negative for: Constitutional, Neurological, Skin, HENT, Endocrine, Psychiatric, Allergic/Imm, Heme/Lymph Last edited by [name], COT on 11/05/2021  1:21 PM.      ALLERGIES Allergies  Allergen Reactions   Iodinated  Contrast Media Rash    Other reaction(s): rash   PAST MEDICAL HISTORY Past Medical History:  Diagnosis Date   Anticoagulated 06/26/2020   Antiphospholipid antibody positive    Aortic aneurysm ([place])    thoracic, Dr Roxan Hockey   Aortic dissection [facility])    BPH (benign prostatic hyperplasia)    Central retinal artery occlusion    Chronic anemia    Chronic renal insufficiency    COPD (chronic obstructive pulmonary disease) (HCC)    DDD (degenerative disc disease), cervical    DJD (degenerative joint disease)    DVT (deep venous thrombosis) (HCC)    Gastric dysmotility    GERD (gastroesophageal reflux disease)    Gout    HTN (hypertension)    Hyperlipidemia    Hypertensive retinopathy    OU   Hypothyroid    Macular degeneration    Myelodysplasia (myelodysplastic syndrome) (HCC)    Nasal septal deviation 06/26/2020   Paget's disease of bone    Peripheral neuropathy    Permanent atrial fibrillation (HCC)    Recurrent epistaxis 06/26/2020   Rheumatoid arthritis (HCC)    Symptomatic bradycardia    TIA (transient ischemic attack)    Past Surgical History:  Procedure Laterality Date   ATRIAL FIBRILLATION ABLATION  09/23/2014   Dr Encarnacion Chu in [place]     lumbar, x 2   CAROTID ENDARTERECTOMY Right 1990   CAROTID STENT     CATARACT EXTRACTION Bilateral 2010   [place]   EYE SURGERY Bilateral 2010   Cat Sx   LUNG DECORTICATION     VATS   PACEMAKER GENERATOR CHANGE  12/03/2011   MDT Adapta ADDR01 generator change by Dr Encarnacion Chu for symptomatic bradycardia   PACEMAKER IMPLANT  2004   MDT    FAMILY HISTORY Family History  Problem Relation Age of Onset   Cirrhosis Mother    Stroke Father    Lung cancer Sister    Lung cancer Brother    SOCIAL HISTORY Social History   Tobacco Use   Smoking status: Former    Packs/day: 2.00    Years: 12.00    Pack years: 24.00    Types: Cigarettes    Quit date: 09/07/1962    Years since quitting: 59.2   Smokeless tobacco:  Never  Vaping Use   Vaping Use: Never used  Substance Use Topics   Alcohol use: Yes    Alcohol/week: 2.0 standard drinks    Types: 2 Glasses of wine per week    Comment: daily   Drug use: Never       OPHTHALMIC EXAM: Base Eye Exam     Visual Acuity (Snellen - Linear)       Right Left   Dist [place] CF @ 30f 20/60 -1   Dist ph [place] NI 20/50 -1         Tonometry (  Tonopen, 1:31 PM)       Right Left   Pressure 10 11         Pupils       Dark Light Shape React APD   Right 2 1 Round Minimal None   Left 2 1 Round Minimal None         Visual Fields (Counting fingers)       Left Right     Full   Restrictions Partial outer inferior temporal deficiency          Extraocular Movement       Right Left    Full, Ortho Full, Ortho         Neuro/Psych     Oriented x3: Yes   Mood/Affect: Normal         Dilation     Both eyes: 1.0% Mydriacyl, 2.5% Phenylephrine @ 1:32 PM           Slit Lamp and Fundus Exam     Slit Lamp Exam       Right Left   Lids/Lashes Dermatochalasis - upper lid, Meibomian gland dysfunction Dermatochalasis - upper lid, Meibomian gland dysfunction   Conjunctiva/Sclera White and quiet White and quiet, +mucus   Cornea Arcus, 2-3+inferior Punctate epithelial erosions, Well healed cataract wounds Arcus, 3+inferior Punctate epithelial erosions centrally, Well healed cataract wounds, mild endo pigment   Anterior Chamber Deep and quiet Deep and quiet   Iris Round and moderately dilated Round and moderately dilated   Lens Posterior chamber intraocular lens, 1+ non-central Posterior capsular opacification Posterior chamber intraocular lens, open PC   Anterior Vitreous Vitreous syneresis, Posterior vitreous detachment Vitreous syneresis         Fundus Exam       Right Left   Disc +cupping, trace pallor, thin superior rim, 360 PPA mild pallor, 360 PPA, +cupping   C/D Ratio 0.7 0.7   Macula Flat, diffuse RPE atrophy, central pigment  clumping, No heme or edema Flat, Blunted foveal reflex, Drusen, RPE mottling, clumping and atrophy, No edema, trace cystic changes -- stably improved, peripapillary GA nasal macula, no heme   Vessels Vascular attenuation Vascular attenuation, Tortuous   Periphery Attached, mild reticular degeneration, mild paving stone inferiorly Attached, mild reticular degeneration           IMAGING AND PROCEDURES  Imaging and Procedures for _0 @  OCT, Retina - OU - Both Eyes       Right Eye Quality was good. Central Foveal Thickness: 199. Progression has been stable. Findings include abnormal foveal contour, no IRF, no SRF, subretinal hyper-reflective material, retinal drusen , outer retinal atrophy, pigment epithelial detachment, inner retinal atrophy.   Left Eye Quality was good. Central Foveal Thickness: 295. Progression has been stable. Findings include abnormal foveal contour, no SRF, outer retinal atrophy, retinal drusen , pigment epithelial detachment, subretinal hyper-reflective material, no IRF (Stable improvement in cystic changes, +GA ).   Notes *Images captured and stored on drive  Diagnosis / Impression:  Exudative ARMD OU OD: +atrophic macular scar - stable OS: Stable improvement in cystic changes, +GA   Clinical management:  See below  Abbreviations: NFP - Normal foveal profile. CME - cystoid macular edema. PED - pigment epithelial detachment. IRF - intraretinal fluid. SRF - subretinal fluid. EZ - ellipsoid zone. ERM - epiretinal membrane. ORA - outer retinal atrophy. ORT - outer retinal tubulation. SRHM - subretinal hyper-reflective material             ASSESSMENT/PLAN:  ICD-10-CM   1. Exudative age-related macular degeneration of left eye with active choroidal neovascularization (HCC)  H35.3221 OCT, Retina - OU - Both Eyes    2. Exudative age-related macular degeneration of right eye with inactive scar ([place])  H35.3213     3. Essential hypertension  I10     4.  Hypertensive retinopathy of both eyes  H35.033     5. Pseudophakia of both eyes  Z96.1     6. Dry eyes  H04.123      1,2. 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    OD- w/ inactive scar -- history of low vision, CF 3'  OS- w/ CNV   - history of multiple IVL OS in [place], [place] w/ Dr. Doristine Church -- last injection 06/2018, q4-6 wk interval  - moved to [place] in November 2019 and was followed by Dr. Baird Cancer who had recommended observation, thus pt sought second opinion  - given functional monocular status and history of active CNV, has been receiving maintenance injections, s/p IVA #1 OS (02.25.20), #2 (04.07.20), #3 (05.05.20), #4 (06.22.20), #5 (08.03.20), #6 (03.22.21)  - s/p IVE OS #1 (09.14.20), #2 (10.23.20), #3 (11.30.20), #4 (01.11.21), #5 (02.15.21), #6 (03.22.21), #7 (05.03.21), #8 (06.16.21), #9 (07.26.21), #10 (09.07.21), #11 (10.19.21), #12 (12.01.2021), #13 (01.19.22), #14 (03.09.22), #15 (05.12.22), #16 (07.08.22), #17 (09.07.22), #18 (11.09.22), #19 (01.11.23)  - OCT shows Stable improvement in cystic changes, persistent GA; OD with inactive scar  - BCVA back to 20/50 OS -- improved dry eye (see below)  - [employer] benefits investigation started 9.14.20 -- approved for 2023  - March 22 -- DFE, OCT, likely injection  3,4. Hypertensive retinopathy OU  - discussed importance of tight BP control  - monitor   5. Pseudophakia OU  - s/p CE/IOL OU  - monitor   6. Dry eyes OU  - 2-3+ PEE OU (OS > OD)  - pt using AT 4-5x a day OU + lubricating ointment at night  - BCVA improved back to baseline OS -- 20/50 - recommend inc artificial tears to 5-6x/day - cont lubricating ointment at night - f/u March 22 for K check   Ophthalmic Meds Ordered this visit:  No orders of the defined types were placed in this encounter.    Return for f/u March 22 as scheduled exu ARMD OS, DFE, OCT.  There are no Patient Instructions on file for this visit.  This document serves as a  record of services personally performed by Gardiner Sleeper, MD, PhD. It was created on their behalf by San Jetty. Owens Shark, OA an ophthalmic technician. The creation of this record is the provider's dictation and/or activities during the visit.    Electronically signed by: San Jetty. Owens Shark, [place] 02.06.2023 1:27 AM   Gardiner Sleeper, M.D., Ph.D. Diseases & Surgery of the Retina and Vitreous [facility] [place]  I have reviewed the above documentation for accuracy and completeness, and I agree with the above. Gardiner Sleeper, M.D., Ph.D. 11/06/21 1:29 AM   Abbreviations: M myopia (nearsighted); A astigmatism; H hyperopia (farsighted); P presbyopia; Mrx spectacle prescription;  CTL contact lenses; OD right eye; OS left eye; OU both eyes  XT exotropia; ET esotropia; PEK punctate epithelial keratitis; PEE punctate epithelial erosions; DES dry eye syndrome; MGD meibomian gland dysfunction; ATs artificial tears; PFAT's preservative free artificial tears; [place] nuclear sclerotic cataract; PSC posterior subcapsular cataract; ERM epi-retinal membrane; PVD posterior vitreous detachment; RD retinal detachment; DM diabetes mellitus; DR diabetic  retinopathy; NPDR non-proliferative diabetic retinopathy; PDR proliferative diabetic retinopathy; CSME clinically significant macular edema; DME diabetic macular edema; dbh dot blot hemorrhages; CWS cotton wool spot; POAG primary open angle glaucoma; C/D cup-to-disc ratio; HVF humphrey visual field; GVF goldmann visual field; OCT optical coherence tomography; IOP intraocular pressure; BRVO Branch retinal vein occlusion; CRVO central retinal vein occlusion; CRAO central retinal artery occlusion; BRAO branch retinal artery occlusion; RT retinal tear; SB scleral buckle; PPV pars plana vitrectomy; VH Vitreous hemorrhage; PRP panretinal laser photocoagulation; IVK intravitreal kenalog; VMT vitreomacular traction; MH Macular hole;  NVD neovascularization of the disc; NVE  neovascularization elsewhere; AREDS age related eye disease study; ARMD age related macular degeneration; POAG primary open angle glaucoma; EBMD epithelial/anterior basement membrane dystrophy; ACIOL anterior chamber intraocular lens; IOL intraocular lens; PCIOL posterior chamber intraocular lens; Phaco/IOL phacoemulsification with intraocular lens placement; [place] photorefractive keratectomy; LASIK laser assisted in situ keratomileusis; HTN hypertension; DM diabetes mellitus; COPD chronic obstructive pulmonary disease

## 2021-11-05 ENCOUNTER — Ambulatory Visit (INDEPENDENT_AMBULATORY_CARE_PROVIDER_SITE_OTHER): Payer: Medicare Other | Admitting: Ophthalmology

## 2021-11-05 ENCOUNTER — Other Ambulatory Visit: Payer: Self-pay

## 2021-11-05 ENCOUNTER — Encounter (INDEPENDENT_AMBULATORY_CARE_PROVIDER_SITE_OTHER): Payer: Self-pay | Admitting: Ophthalmology

## 2021-11-05 DIAGNOSIS — H353221 Exudative age-related macular degeneration, left eye, with active choroidal neovascularization: Secondary | ICD-10-CM

## 2021-11-05 DIAGNOSIS — H35033 Hypertensive retinopathy, bilateral: Secondary | ICD-10-CM | POA: Diagnosis not present

## 2021-11-05 DIAGNOSIS — H04123 Dry eye syndrome of bilateral lacrimal glands: Secondary | ICD-10-CM

## 2021-11-05 DIAGNOSIS — I10 Essential (primary) hypertension: Secondary | ICD-10-CM | POA: Diagnosis not present

## 2021-11-05 DIAGNOSIS — H353213 Exudative age-related macular degeneration, right eye, with inactive scar: Secondary | ICD-10-CM | POA: Diagnosis not present

## 2021-11-05 DIAGNOSIS — Z961 Presence of intraocular lens: Secondary | ICD-10-CM

## 2021-11-06 ENCOUNTER — Encounter (INDEPENDENT_AMBULATORY_CARE_PROVIDER_SITE_OTHER): Payer: Self-pay | Admitting: Ophthalmology

## 2021-11-10 ENCOUNTER — Other Ambulatory Visit: Payer: Self-pay

## 2021-11-10 ENCOUNTER — Inpatient Hospital Stay: Payer: Medicare Other | Attending: Hematology and Oncology

## 2021-11-10 ENCOUNTER — Inpatient Hospital Stay (HOSPITAL_BASED_OUTPATIENT_CLINIC_OR_DEPARTMENT_OTHER): Payer: Medicare Other | Admitting: Hematology and Oncology

## 2021-11-10 VITALS — BP 128/83 | HR 74 | Temp 97.4°F | Resp 16 | Wt 196.7 lb

## 2021-11-10 DIAGNOSIS — D649 Anemia, unspecified: Secondary | ICD-10-CM | POA: Diagnosis not present

## 2021-11-10 DIAGNOSIS — D696 Thrombocytopenia, unspecified: Secondary | ICD-10-CM

## 2021-11-10 DIAGNOSIS — D539 Nutritional anemia, unspecified: Secondary | ICD-10-CM | POA: Insufficient documentation

## 2021-11-10 LAB — CBC WITH DIFFERENTIAL (CANCER CENTER ONLY)
Abs Immature Granulocytes: 0.03 10*3/uL (ref 0.00–0.07)
Basophils Absolute: 0 10*3/uL (ref 0.0–0.1)
Basophils Relative: 1 %
Eosinophils Absolute: 0.1 10*3/uL (ref 0.0–0.5)
Eosinophils Relative: 3 %
HCT: 31 % — ABNORMAL LOW (ref 39.0–52.0)
Hemoglobin: 10 g/dL — ABNORMAL LOW (ref 13.0–17.0)
Immature Granulocytes: 1 %
Lymphocytes Relative: 14 %
Lymphs Abs: 0.7 10*3/uL (ref 0.7–4.0)
MCH: 33.3 pg (ref 26.0–34.0)
MCHC: 32.3 g/dL (ref 30.0–36.0)
MCV: 103.3 fL — ABNORMAL HIGH (ref 80.0–100.0)
Monocytes Absolute: 0.6 10*3/uL (ref 0.1–1.0)
Monocytes Relative: 11 %
Neutro Abs: 3.6 10*3/uL (ref 1.7–7.7)
Neutrophils Relative %: 70 %
Platelet Count: 92 10*3/uL — ABNORMAL LOW (ref 150–400)
RBC: 3 MIL/uL — ABNORMAL LOW (ref 4.22–5.81)
RDW: 14.4 % (ref 11.5–15.5)
WBC Count: 5.1 10*3/uL (ref 4.0–10.5)
nRBC: 0 % (ref 0.0–0.2)

## 2021-11-10 LAB — CMP (CANCER CENTER ONLY)
ALT: 12 U/L (ref 0–44)
AST: 17 U/L (ref 15–41)
Albumin: 4.1 g/dL (ref 3.5–5.0)
Alkaline Phosphatase: 160 U/L — ABNORMAL HIGH (ref 38–126)
Anion gap: 9 (ref 5–15)
BUN: 52 mg/dL — ABNORMAL HIGH (ref 8–23)
CO2: 26 mmol/L (ref 22–32)
Calcium: 10.8 mg/dL — ABNORMAL HIGH (ref 8.9–10.3)
Chloride: 106 mmol/L (ref 98–111)
Creatinine: 2.71 mg/dL — ABNORMAL HIGH (ref 0.61–1.24)
GFR, Estimated: 22 mL/min — ABNORMAL LOW (ref >60)
Glucose, Bld: 105 mg/dL — ABNORMAL HIGH (ref 70–99)
Potassium: 4.3 mmol/L (ref 3.5–5.1)
Sodium: 141 mmol/L (ref 135–145)
Total Bilirubin: 0.5 mg/dL (ref 0.3–1.2)
Total Protein: 6.8 g/dL (ref 6.5–8.1)

## 2021-11-10 LAB — FOLATE: Folate: 24 ng/mL (ref >5.9)

## 2021-11-10 LAB — VITAMIN B12: Vitamin B-12: 992 pg/mL — ABNORMAL HIGH (ref 180–914)

## 2021-11-10 NOTE — Progress Notes (Deleted)
? ?PCP:  Ginger Organ., MD ?Primary Cardiologist: None ?Electrophysiologist: Thompson Grayer, MD  ? ?Steven Williams is a 88 y.o. male seen today for Thompson Grayer, MD for routine electrophysiology followup.  Since last being seen in our clinic the patient has *** ? ?*** ? ?He was on coumadin last I saw him, and attempted to make switch to Eliquis. He had multiple ED and ENT visits with nosebleeds on Eliquis, ultimately requesting he go back on coumadin, which has previously been managed by Dr. Brigitte Pulse.  ? ?Today he is doing well. He is back on coumadin, INR per PCP. He has not had any further nose bleeds in approx 3 weeks. (Since holding eliquis and transitioning back to coumadin. He has intermittent short episodes of SOB, sometimes at rest. Otherwise he is able to do his ADLs without difficulty.  ? ?Device History: ?Medtronic Dual Chamber PPM implanted 11/2011 for symptomatic bradycardia ? ?Past Medical History:  ?Diagnosis Date  ? Anticoagulated 06/26/2020  ? Antiphospholipid antibody positive   ? Aortic aneurysm ([place])   ? thoracic, Dr Roxan Hockey  ? Aortic dissection ([place])   ? BPH (benign prostatic hyperplasia)   ? Central retinal artery occlusion   ? Chronic anemia   ? Chronic renal insufficiency   ? COPD (chronic obstructive pulmonary disease) ([place])   ? DDD (degenerative disc disease), cervical   ? DJD (degenerative joint disease)   ? DVT (deep venous thrombosis) ([place])   ? Gastric dysmotility   ? GERD (gastroesophageal reflux disease)   ? Gout   ? HTN (hypertension)   ? Hyperlipidemia   ? Hypertensive retinopathy   ? OU  ? Hypothyroid   ? Macular degeneration   ? Myelodysplasia (myelodysplastic syndrome) ([place])   ? Nasal septal deviation 06/26/2020  ? Paget's disease of bone   ? Peripheral neuropathy   ? Permanent atrial fibrillation ([place])   ? Recurrent epistaxis 06/26/2020  ? Rheumatoid arthritis ([place])   ? Symptomatic bradycardia   ? TIA (transient ischemic attack)   ? ?Past Surgical History:  ?Procedure  Laterality Date  ? ATRIAL FIBRILLATION ABLATION  09/23/2014  ? Dr Encarnacion Chu in [place]  ? BACK SURGERY    ? lumbar, x 2  ? CAROTID ENDARTERECTOMY Right 1990  ? CAROTID STENT    ? CATARACT EXTRACTION Bilateral 2010  ? [place]  ? EYE SURGERY Bilateral 2010  ? Cat Sx  ? LUNG DECORTICATION    ? VATS  ? PACEMAKER GENERATOR CHANGE  12/03/2011  ? MDT Adapta ADDR01 generator change by Dr Encarnacion Chu for symptomatic bradycardia  ? PACEMAKER IMPLANT  2004  ? MDT   ? ? ?Current Outpatient Medications  ?Medication Sig Dispense Refill  ? acetaminophen (TYLENOL) 500 MG tablet Take 1,000 mg by mouth daily.    ? allopurinol (ZYLOPRIM) 100 MG tablet Take 100 mg by mouth daily.    ? allopurinol (ZYLOPRIM) 100 MG tablet Take 1 tablet by mouth daily.    ? carvedilol (COREG) 6.25 MG tablet Take 6.25 mg by mouth 2 (two) times daily with a meal.    ? Cholecalciferol (VITAMIN D3 PO) Take 1 tablet by mouth in the morning and at bedtime.    ? dicyclomine (BENTYL) 10 MG capsule Take 30 mg by mouth in the morning and at bedtime.    ? digoxin (LANOXIN) 0.125 MG tablet Take 0.125 mg by mouth every Monday, Wednesday, and Friday.    ? Ferrous Sulfate (IRON) 325 (65 Fe) MG TABS Take 1 tablet by mouth  2 (two) times daily.    ? fluticasone (FLONASE) 50 MCG/ACT nasal spray Administer 2 sprays in the right nostril daily.    ? furosemide (LASIX) 40 MG tablet Take 40 mg by mouth daily.    ? hydrocortisone (ANUSOL-HC) 2.5 % rectal cream Apply 1 application topically 2 (two) times daily as needed for hemorrhoids or anal itching.    ? levothyroxine (SYNTHROID) 100 MCG tablet Take 100 mcg by mouth every morning.    ? loteprednol (LOTEMAX) 0.5 % ophthalmic suspension     ? Misc Natural Products (GLUCOSAMINE CHOND COMPLEX/MSM PO) Take 1 tablet by mouth daily.    ? Multiple Vitamins-Minerals (PRESERVISION AREDS PO) Take 1 tablet by mouth 2 (two) times daily.    ? oxymetazoline (AFRIN 12 HOUR) 0.05 % nasal spray Place 2 sprays into both nostrils 2 (two) times daily as  needed for congestion.    ? pantoprazole (PROTONIX) 40 MG tablet Take 40 mg by mouth daily.    ? pregabalin (LYRICA) 75 MG capsule Take 75 mg by mouth at bedtime.    ? Ranibizumab (LUCENTIS) 0.3 MG/0.05ML SOLN every 8 (eight) weeks.    ? rOPINIRole (REQUIP) 2 MG tablet Take 1/2 tablet po q hs    ? rosuvastatin (CRESTOR) 10 MG tablet Take 10 mg by mouth daily.    ? rotigotine (NEUPRO) 2 MG/24HR Place 1 patch onto the skin daily. 30 patch 12  ? saw palmetto 500 MG capsule Take 500 mg by mouth 2 (two) times daily.    ? tamsulosin (FLOMAX) 0.4 MG CAPS capsule Take 0.4 mg by mouth daily.    ? vitamin B-12 (CYANOCOBALAMIN) 1000 MCG tablet Take 1 tablet (1,000 mcg total) by mouth daily. 30 tablet 3  ? warfarin (COUMADIN) 2 MG tablet Take by mouth daily.    ? ?Current Facility-Administered Medications  ?Medication Dose Route Frequency Provider Last Rate Last Admin  ? Bevacizumab (AVASTIN) SOLN 1.25 mg  1.25 mg Intravitreal  [name], MD   1.25 mg at 11/01/18 2343  ? diphenhydrAMINE (BENADRYL) capsule 50 mg  50 mg Oral Once Logan Bores, MD      ? ? ?Allergies  ?Allergen Reactions  ? Iodinated Contrast Media Rash  ?  Other reaction(s): rash  ? ? ?Social History  ? ?Socioeconomic History  ? Marital status: Widowed  ?  Spouse name: Not on file  ? Number of children: 3  ? Years of education: Not on file  ? Highest education level: Bachelor's degree (e.g., BA, AB, BS)  ?Occupational History  ? Occupation: retired  ?  Comment: retired ME  ?Tobacco Use  ? Smoking status: Former  ?  Packs/day: 2.00  ?  Years: 12.00  ?  Pack years: 24.00  ?  Types: Cigarettes  ?  Quit date: 09/07/1962  ?  Years since quitting: 59.2  ? Smokeless tobacco: Never  ?Vaping Use  ? Vaping Use: Never used  ?Substance and Sexual Activity  ? Alcohol use: Yes  ?  Alcohol/week: 2.0 standard drinks  ?  Types: 2 Glasses of wine per week  ?  Comment: daily  ? Drug use: Never  ? Sexual activity: Not Currently  ?Other Topics Concern  ? Not on file  ?Social  History Narrative  ? Previously lived in [place]  ? 03/12/20 Now lives in [place]  ? Retired Chief Financial Officer  ? Caffeine 2 a day  ? ?Social Determinants of Health  ? ?Financial Resource Strain: Not on file  ?Food Insecurity:  Not on file  ?Transportation Needs: Not on file  ?Physical Activity: Not on file  ?Stress: Not on file  ?Social Connections: Not on file  ?Intimate Partner Violence: Not on file  ? ? ?Review of systems complete and found to be negative unless listed in HPI.   ? ?Physical Exam: ?There were no vitals filed for this visit. ? ?General: Pleasant, NAD. No resp difficulty ?Psych: Normal affect. ?HEENT:  Normal, without mass or lesion.         ?Neck: Supple, no bruits or JVD. Carotids 2+. No lymphadenopathy/thyromegaly appreciated. ?Heart: PMI nondisplaced. RRR no s3, s4, or murmurs. ?Lungs:  Resp regular and unlabored, CTA. ?Abdomen: Soft, non-tender, non-distended, No HSM, BS + x 4.   ?Extremities: No clubbing, cyanosis or edema. DP/PT/Radials 2+ and equal bilaterally. ?Neuro: Alert and oriented X 3. Moves all extremities spontaneously.  ? ?EKG is not ordered.  ? ?Additional studies reviewed include: ?Previous EP office notes.  ? ?Assessment and Plan: ? ?1. Symptomatic bradycardia s/p Medtronic PPM  ?Normal PPM function today.  ?See PaceART report ?  ?2. Permanent Afib ?Rates well controlled overall by device ?Continue coumadin for CHA2DS2VASC of at least 6   ?Continue digoxin.  Level today.  ?  ?3. HTN ?Stable on current regimen  ?  ?4. High risk drug monitoring ?Digoxin level today ?  ?5. Chronic diastolic CHF ?LVEF 65-70% 09/2018 ?Volume status *** on exam today. Marland Kitchen  ?Continue lasix 40 mg qod. Discussed sliding scale diuretics, sodium restriction, fluid restriction. ?He should take 20 mg lasix EXTRA as needed for weight gain of 3 lbs overnight, or 5 lbs within one week.  ?Labs today.  ? ?6. Epistaxis ?*** on coumadin ?PCP following INR.  ? ?RTC 6 months. Sooner with issues.  ? ?Shirley Friar, PA-C  ?11/10/21 ?9:46 AM ? ?

## 2021-11-10 NOTE — Progress Notes (Signed)
[place] [phone]   [phone]  PROGRESS NOTE  Patient Care Team: Ginger Organ., MD as PCP - General (Internal Medicine) Thompson Grayer, MD as PCP - Electrophysiology (Cardiology) Ginger Organ., MD as Referring Physician (Internal Medicine)  Hematological/Oncological History # Thrombocytopenia # Macrocytic Anemia 08/30/2019: WBC 4.9, Hgb 10.3, MCV 100, Plt 109 03/06/2020: WBC 4.2, Hgb 10.7, MCV 100, Plt 78 05/14/2021: WBC 3.8, Hgb 9.6, MCV 98, Plt 121 06/23/2021: establish care with Dr. Lorenso Courier.  Found to have elevated methylmalonic acid and started on oral vitamin B12 therapy.  Interval History:  Steven Williams 88 y.o. male with medical history significant for thrombocytopenia who presents for a follow up visit. The patient's last visit was on 06/23/2021. In the interim since the last visit he was found to have modest increase in MMA levels, concern for possible vitamin b12 deficiency.  On exam today Steven Williams reports that he has been well in the interim since our last visit 6 months ago.  He reports has been compliant with his vitamin B12 p.o. therapy and is not having difficulties with the pills.  He notes he has noticed a difference with the B12 and that he is "more alert".  He reports his energy levels are an 8 out of 10.  He tends to workout daily in the gym.  He reports that he is eating "too good".  His weight is up to 196 pounds up from 184 pounds on 06/23/2021.  He denies any bleeding or bruising though he does occasionally have some nosebleeds and this is improved with hydration of his nostrils.  He otherwise denies any fevers, chills, sweats, nausea, vomiting or diarrhea.  A full 10 point ROS is listed below.  MEDICAL HISTORY:  Past Medical History:  Diagnosis Date   Anticoagulated 06/26/2020   Antiphospholipid antibody positive    Aortic aneurysm (HCC)    thoracic, Dr Roxan Hockey   Aortic dissection [facility])    BPH (benign  prostatic hyperplasia)    Central retinal artery occlusion    Chronic anemia    Chronic renal insufficiency    COPD (chronic obstructive pulmonary disease) (HCC)    DDD (degenerative disc disease), cervical    DJD (degenerative joint disease)    DVT (deep venous thrombosis) (HCC)    Gastric dysmotility    GERD (gastroesophageal reflux disease)    Gout    HTN (hypertension)    Hyperlipidemia    Hypertensive retinopathy    OU   Hypothyroid    Macular degeneration    Myelodysplasia (myelodysplastic syndrome) ([place])    Nasal septal deviation 06/26/2020   Paget's disease of bone    Peripheral neuropathy    Permanent atrial fibrillation (HCC)    Recurrent epistaxis 06/26/2020   Rheumatoid arthritis (HCC)    Symptomatic bradycardia    TIA (transient ischemic attack)     SURGICAL HISTORY: Past Surgical History:  Procedure Laterality Date   ATRIAL FIBRILLATION ABLATION  09/23/2014   Dr Encarnacion Chu in [place]     lumbar, x 2   CAROTID ENDARTERECTOMY Right 1990   CAROTID STENT     CATARACT EXTRACTION Bilateral 2010   [place]   EYE SURGERY Bilateral 2010   [place] [facility]   LUNG DECORTICATION     VATS   PACEMAKER GENERATOR CHANGE  12/03/2011   MDT [facility] ADDR01 generator change by Dr Encarnacion Chu for symptomatic bradycardia   PACEMAKER IMPLANT  2004   [facility]  SOCIAL HISTORY: Social History   Socioeconomic History   Marital status: Widowed    Spouse name: Not on file   Number of children: 3   Years of education: Not on file   Highest education level: Bachelor's degree (e.g., BA, AB, BS)  Occupational History   Occupation: retired    Comment: retired ME  Tobacco Use   Smoking status: Former    Packs/day: 2.00    Years: 12.00    Pack years: 24.00    Types: Cigarettes    Quit date: 09/07/1962    Years since quitting: 59.2   Smokeless tobacco: Never  Vaping Use   Vaping Use: Never used  Substance and Sexual Activity   Alcohol use: Yes    Alcohol/week: 2.0 standard  drinks    Types: 2 Glasses of wine per week    Comment: daily   Drug use: Never   Sexual activity: Not Currently  Other Topics Concern   Not on file  Social History Narrative   Previously lived in [place]   03/12/20 Now lives in [place] ([place]   Retired Chief Financial Officer   Caffeine 2 a day   Social Determinants of Radio broadcast assistant Strain: Not on file  Food Insecurity: Not on file  Transportation Needs: Not on file  Physical Activity: Not on file  Stress: Not on file  Social Connections: Not on file  Intimate Partner Violence: Not on file    FAMILY HISTORY: Family History  Problem Relation Age of Onset   Cirrhosis Mother    Stroke Father    Lung cancer Sister    Lung cancer Brother     ALLERGIES:  is allergic to iodinated contrast media.  MEDICATIONS:  Current Outpatient Medications  Medication Sig Dispense Refill   acetaminophen (TYLENOL) 500 MG tablet Take 1,000 mg by mouth daily.     allopurinol (ZYLOPRIM) 100 MG tablet Take 100 mg by mouth daily.     allopurinol (ZYLOPRIM) 100 MG tablet Take 1 tablet by mouth daily.     carvedilol (COREG) 6.25 MG tablet Take 6.25 mg by mouth 2 (two) times daily with a meal.     Cholecalciferol (VITAMIN D3 PO) Take 1 tablet by mouth in the morning and at bedtime.     dicyclomine (BENTYL) 10 MG capsule Take 30 mg by mouth in the morning and at bedtime.     digoxin (LANOXIN) 0.125 MG tablet Take 0.125 mg by mouth every Monday, Wednesday, and Friday.     Ferrous Sulfate (IRON) 325 (65 Fe) MG TABS Take 1 tablet by mouth 2 (two) times daily.     fluticasone (FLONASE) 50 MCG/ACT nasal spray Administer 2 sprays in the right nostril daily.     furosemide (LASIX) 40 MG tablet Take 40 mg by mouth daily.     hydrocortisone (ANUSOL-HC) 2.5 % rectal cream Apply 1 application topically 2 (two) times daily as needed for hemorrhoids or anal itching.     levothyroxine (SYNTHROID) 100 MCG tablet Take 100 mcg by mouth every morning.      loteprednol (LOTEMAX) 0.5 % ophthalmic suspension      Misc Natural Products (GLUCOSAMINE CHOND COMPLEX/MSM PO) Take 1 tablet by mouth daily.     Multiple Vitamins-Minerals (PRESERVISION AREDS PO) Take 1 tablet by mouth 2 (two) times daily.     oxymetazoline (AFRIN 12 HOUR) 0.05 % nasal spray Place 2 sprays into both nostrils 2 (two) times daily as needed for congestion.     pantoprazole (PROTONIX)  40 MG tablet Take 40 mg by mouth daily.     pregabalin (LYRICA) 75 MG capsule Take 75 mg by mouth at bedtime.     Ranibizumab (LUCENTIS) 0.3 MG/0.05ML SOLN every 8 (eight) weeks.     rOPINIRole (REQUIP) 2 MG tablet Take 1/2 tablet po q hs     rosuvastatin (CRESTOR) 10 MG tablet Take 10 mg by mouth daily.     rotigotine (NEUPRO) 2 MG/24HR Place 1 patch onto the skin daily. 30 patch 12   saw palmetto 500 MG capsule Take 500 mg by mouth 2 (two) times daily.     tamsulosin (FLOMAX) 0.4 MG CAPS capsule Take 0.4 mg by mouth daily.     vitamin B-12 (CYANOCOBALAMIN) 1000 MCG tablet Take 1 tablet (1,000 mcg total) by mouth daily. 30 tablet 3   warfarin (COUMADIN) 2 MG tablet Take by mouth daily.     Current Facility-Administered Medications  Medication Dose Route Frequency Provider Last Rate Last Admin   Bevacizumab (AVASTIN) SOLN 1.25 mg  1.25 mg Intravitreal  [name], MD   1.25 mg at 11/01/18 2343   diphenhydrAMINE (BENADRYL) capsule 50 mg  50 mg Oral Once Logan Bores, MD        REVIEW OF SYSTEMS:   Constitutional: ( - ) fevers, ( - )  chills , ( - ) night sweats Eyes: ( - ) blurriness of vision, ( - ) double vision, ( - ) watery eyes Ears, nose, mouth, throat, and face: ( - ) mucositis, ( - ) sore throat Respiratory: ( - ) cough, ( - ) dyspnea, ( - ) wheezes Cardiovascular: ( - ) palpitation, ( - ) chest discomfort, ( - ) lower extremity swelling Gastrointestinal:  ( - ) nausea, ( - ) heartburn, ( - ) change in bowel habits Skin: ( - ) abnormal skin rashes Lymphatics: ( - ) new  lymphadenopathy, ( - ) easy bruising Neurological: ( - ) numbness, ( - ) tingling, ( - ) new weaknesses Behavioral/Psych: ( - ) mood change, ( - ) new changes  All other systems were reviewed with the patient and are negative.  PHYSICAL EXAMINATION:  Vitals:   11/10/21 1409  BP: 128/83  Pulse: 74  Resp: 16  Temp: (!) 97.4 F (36.3 C)  SpO2: 99%   Filed Weights   11/10/21 1409  Weight: 196 lb 11.2 oz (89.2 kg)    GENERAL: Chronically ill-appearing elderly Caucasian male, alert, no distress and comfortable SKIN: skin color, texture, turgor are normal, no rashes or significant lesions EYES: conjunctiva are pink and non-injected, sclera clear LUNGS: clear to auscultation and percussion with normal breathing effort HEART: regular rate & rhythm and no murmurs and no lower extremity edema Musculoskeletal: no cyanosis of digits and no clubbing  PSYCH: alert & oriented x 3, fluent speech NEURO: no focal motor/sensory deficits  LABORATORY DATA:  I have reviewed the data as listed CBC Latest Ref Rng & Units 11/10/2021 06/23/2021 05/14/2021  WBC 4.0 - 10.5 K/uL 5.1 5.4 3.8  Hemoglobin 13.0 - 17.0 g/dL 10.0(L) 10.9(L) 9.6(L)  Hematocrit 39.0 - 52.0 % 31.0(L) 33.1(L) 27.8(L)  Platelets 150 - 400 K/uL 92(L) 114(L) 121(L)    CMP Latest Ref Rng & Units 11/10/2021 06/23/2021 05/14/2021  Glucose 70 - 99 mg/dL 105(H) 105(H) 99  BUN 8 - 23 mg/dL 52(H) 59(H) 39(H)  Creatinine 0.61 - 1.24 mg/dL 2.71(H) 2.18(H) 1.70(H)  Sodium 135 - 145 mmol/L 141 143 139  Potassium 3.5 - 5.1 mmol/L  4.3 3.9 4.6  Chloride 98 - 111 mmol/L 106 107 103  CO2 22 - 32 mmol/L '26 23 20  ' Calcium 8.9 - 10.3 mg/dL 10.8(H) 10.4(H) 9.2  Total Protein 6.5 - 8.1 g/dL 6.8 7.6 -  Total Bilirubin 0.3 - 1.2 mg/dL 0.5 0.6 -  Alkaline Phos 38 - 126 U/L 160(H) 185(H) -  AST 15 - 41 U/L 17 21 -  ALT 0 - 44 U/L 12 13 -    Lab Results  Component Value Date   MPROTEIN Not Observed 06/23/2021   Lab Results  Component Value Date    KPAFRELGTCHN 75.7 (H) 06/23/2021   LAMBDASER 39.3 (H) 06/23/2021   KAPLAMBRATIO 1.93 (H) 06/23/2021    RADIOGRAPHIC STUDIES: OCT, Retina - OU - Both Eyes  Result Date: 11/06/2021 Right Eye Quality was good. Central Foveal Thickness: 199. Progression has been stable. Findings include abnormal foveal contour, no IRF, no SRF, subretinal hyper-reflective material, retinal drusen , outer retinal atrophy, pigment epithelial detachment, inner retinal atrophy. Left Eye Quality was good. Central Foveal Thickness: 295. Progression has been stable. Findings include abnormal foveal contour, no SRF, outer retinal atrophy, retinal drusen , pigment epithelial detachment, subretinal hyper-reflective material, no IRF (Stable improvement in cystic changes, +GA ). 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 CME - cystoid macular edema. PED - pigment epithelial detachment. IRF - intraretinal fluid. SRF - subretinal fluid. EZ - ellipsoid zone. ERM - epiretinal membrane. ORA - outer retinal atrophy. ORT - outer retinal tubulation. SRHM - subretinal hyper-reflective material   OCT, Retina - OU - Both Eyes  Result Date: 10/15/2021 Right Eye Quality was good. Central Foveal Thickness: 204. Progression has been stable. Findings include abnormal foveal contour, no IRF, no SRF, subretinal hyper-reflective material, retinal drusen , outer retinal atrophy, pigment epithelial detachment, inner retinal atrophy. Left Eye Quality was good. Central Foveal Thickness: 272. Progression has been stable. Findings include abnormal foveal contour, no SRF, outer retinal atrophy, retinal drusen , pigment epithelial detachment, subretinal hyper-reflective material, no IRF (Stable improvement in cystic changes, persistent GA -- mild progression of central  ORA/ellipsoid loss). Notes *Images captured and stored on drive Diagnosis / Impression: Exudative ARMD OU OD: +atrophic macular scar - stable OS: Stable improvement in cystic changes, persistent GA -- mild progression of central ORA/ellipsoid loss Clinical management: See below Abbreviations: NFP - Normal foveal profile. CME - cystoid macular edema. PED - pigment epithelial detachment. IRF - intraretinal fluid. SRF - subretinal fluid. EZ - ellipsoid zone. ERM - epiretinal membrane. ORA - outer retinal atrophy. ORT - outer retinal tubulation. SRHM - subretinal hyper-reflective material    ASSESSMENT & PLAN Steven Williams 88 y.o. male with medical history significant for thrombocytopenia who presents for a follow up visit.   At this time the etiology of the patient's macrocytic anemia and thrombocytopenia is unclear.  He does have chronic kidney disease but that normally does not cause a macrocytic anemia.  He does carry a diagnosis of myelodysplastic syndrome though he has never had a bone marrow biopsy and we cannot find one on record.  He notes that he is compliant with his vitamin B12 therapy.  Assuming his vitamin B12 levels are normalizing would recommend pursuing a bone marrow biopsy.  If vitamin B12 levels remain low we need to consider IM supplementation of vitamin B12.  #  Macrocytic Anemia #Thrombocytopenia #Elevated Methylmalonic Acid -- repeat vitamin B12 and methylmalonic acid today to see if levels have improved on p.o. therapy. -- If no improvement in his hemoglobin or platelets today would recommend pursuing a bone marrow biopsy --Patient has reported prior history of myelodysplastic syndrome though there is no record of bone marrow biopsy and the patient denies having the procedure performed in the past. -- Placeholder visit scheduled in 3 months time though will need to be seen sooner if a bone marrow biopsy is required.  No orders of the defined types were placed in this  encounter.   All questions were answered. The patient knows to call the clinic with any problems, questions or concerns.  A total of more than 30 minutes were spent on this encounter with face-to-face time and non-face-to-face time, including preparing to see the patient, ordering tests and/or medications, counseling the patient and coordination of care as outlined above.   Ledell Peoples, MD Department of Hematology/Oncology [place] at [facility] Phone: [phone] Pager: [phone] Email: [email or web address]  11/10/2021 2:57 PM

## 2021-11-13 LAB — METHYLMALONIC ACID, SERUM: Methylmalonic Acid, Quantitative: 383 nmol/L — ABNORMAL HIGH (ref 0–378)

## 2021-11-17 ENCOUNTER — Emergency Department (HOSPITAL_COMMUNITY)
Admission: EM | Admit: 2021-11-17 | Discharge: 2021-11-17 | Disposition: A | Discharge status: Home / Self Care | Payer: Medicare Other | Attending: Emergency Medicine | Admitting: Emergency Medicine

## 2021-11-17 ENCOUNTER — Other Ambulatory Visit: Payer: Self-pay

## 2021-11-17 ENCOUNTER — Encounter (HOSPITAL_COMMUNITY): Payer: Self-pay

## 2021-11-17 ENCOUNTER — Encounter: Payer: Medicare Other | Admitting: Student

## 2021-11-17 DIAGNOSIS — I5032 Chronic diastolic (congestive) heart failure: Secondary | ICD-10-CM

## 2021-11-17 DIAGNOSIS — Z79899 Other long term (current) drug therapy: Secondary | ICD-10-CM | POA: Diagnosis not present

## 2021-11-17 DIAGNOSIS — I10 Essential (primary) hypertension: Secondary | ICD-10-CM | POA: Diagnosis not present

## 2021-11-17 DIAGNOSIS — Z7901 Long term (current) use of anticoagulants: Secondary | ICD-10-CM | POA: Diagnosis not present

## 2021-11-17 DIAGNOSIS — I4821 Permanent atrial fibrillation: Secondary | ICD-10-CM

## 2021-11-17 DIAGNOSIS — R040 Epistaxis: Secondary | ICD-10-CM | POA: Diagnosis present

## 2021-11-17 DIAGNOSIS — I340 Nonrheumatic mitral (valve) insufficiency: Secondary | ICD-10-CM

## 2021-11-17 LAB — COMPREHENSIVE METABOLIC PANEL
ALT: 14 U/L (ref 0–44)
AST: 23 U/L (ref 15–41)
Albumin: 3.8 g/dL (ref 3.5–5.0)
Alkaline Phosphatase: 173 U/L — ABNORMAL HIGH (ref 38–126)
Anion gap: 13 (ref 5–15)
BUN: 47 mg/dL — ABNORMAL HIGH (ref 8–23)
CO2: 25 mmol/L (ref 22–32)
Calcium: 10.1 mg/dL (ref 8.9–10.3)
Chloride: 103 mmol/L (ref 98–111)
Creatinine, Ser: 2.35 mg/dL — ABNORMAL HIGH (ref 0.61–1.24)
GFR, Estimated: 26 mL/min — ABNORMAL LOW (ref >60)
Glucose, Bld: 122 mg/dL — ABNORMAL HIGH (ref 70–99)
Potassium: 3.7 mmol/L (ref 3.5–5.1)
Sodium: 141 mmol/L (ref 135–145)
Total Bilirubin: 0.7 mg/dL (ref 0.3–1.2)
Total Protein: 6.8 g/dL (ref 6.5–8.1)

## 2021-11-17 LAB — CBC
HCT: 32.4 % — ABNORMAL LOW (ref 39.0–52.0)
Hemoglobin: 10.7 g/dL — ABNORMAL LOW (ref 13.0–17.0)
MCH: 34.5 pg — ABNORMAL HIGH (ref 26.0–34.0)
MCHC: 33 g/dL (ref 30.0–36.0)
MCV: 104.5 fL — ABNORMAL HIGH (ref 80.0–100.0)
Platelets: 86 10*3/uL — ABNORMAL LOW (ref 150–400)
RBC: 3.1 MIL/uL — ABNORMAL LOW (ref 4.22–5.81)
RDW: 14.6 % (ref 11.5–15.5)
WBC: 4.7 10*3/uL (ref 4.0–10.5)
nRBC: 0 % (ref 0.0–0.2)

## 2021-11-17 LAB — PROTIME-INR
INR: 2.7 — ABNORMAL HIGH (ref 0.8–1.2)
Prothrombin Time: 28.8 seconds — ABNORMAL HIGH (ref 11.4–15.2)

## 2021-11-17 MED ORDER — OXYMETAZOLINE HCL 0.05 % NA SOLN
3.0000 | Freq: Once | NASAL | Status: AC
  Administered 2021-11-17: 3 via NASAL
  Filled 2021-11-17: qty 30

## 2021-11-17 MED ORDER — SILVER NITRATE-POT NITRATE 75-25 % EX MISC
1.0000 | CUTANEOUS | Status: AC
  Administered 2021-11-17: 1 via TOPICAL
  Filled 2021-11-17: qty 1

## 2021-11-17 NOTE — ED Notes (Signed)
Attempted to ambulate pt, pt stated "that his nose was still bleeding and he didn't think it was wise for him to get up and walk around while he was bleeding". RN and PA made aware, pt was digging in his nose with a tissue when I entered his room.  ?

## 2021-11-17 NOTE — ED Notes (Signed)
Ambulated pt to bathroom w/ 2 assist. Pt uses cane or walker at baseline. ?

## 2021-11-17 NOTE — ED Provider Notes (Signed)
[facility] EMERGENCY DEPARTMENT Provider Note   CSN: [id number] Arrival date & time: 11/17/21  4656     History  Chief Complaint  Patient presents with   Epistaxis    Steven Williams is a 88 y.o. male with a past medical history of A-fib currently on Coumadin, recurrent epistaxis, hypertension presenting to the ED with a chief complaint of epistaxis.  He noticed approximately 2 hours ago that he had a nosebleed.  States that this woke him up from his sleep when he felt something in his throat.  States that although he has a history of recurrent epistaxis he has not had one for about a month due to nasal hygiene instructions given by ENT.  Denies any trauma to the area.  Denies any shortness of breath   Epistaxis Associated symptoms: no cough, no dizziness, no fever, no sneezing and no sore throat       Home Medications Prior to Admission medications   Medication Sig Start Date End Date Taking? Authorizing Provider  acetaminophen (TYLENOL) 500 MG tablet Take 1,000 mg by mouth daily.    [provider]  allopurinol (ZYLOPRIM) 100 MG tablet Take 100 mg by mouth daily.    [provider]  allopurinol (ZYLOPRIM) 100 MG tablet Take 1 tablet by mouth daily.    [provider]  carvedilol (COREG) 6.25 MG tablet Take 6.25 mg by mouth 2 (two) times daily with a meal.    [provider]  Cholecalciferol (VITAMIN D3 PO) Take 1 tablet by mouth in the morning and at bedtime.    [provider]  dicyclomine (BENTYL) 10 MG capsule Take 30 mg by mouth in the morning and at bedtime.    [provider]  digoxin (LANOXIN) 0.125 MG tablet Take 0.125 mg by mouth every Monday, Wednesday, and Friday.    [provider]  Ferrous Sulfate (IRON) 325 (65 Fe) MG TABS Take 1 tablet by mouth 2 (two) times daily.    [provider]  fluticasone (FLONASE) 50 MCG/ACT nasal spray Administer 2 sprays in the right nostril daily.  06/13/21 06/13/22  [provider]  furosemide (LASIX) 40 MG tablet Take 40 mg by mouth daily.    [provider]  hydrocortisone (ANUSOL-HC) 2.5 % rectal cream Apply 1 application topically 2 (two) times daily as needed for hemorrhoids or anal itching. 04/15/21   [provider]  levothyroxine (SYNTHROID) 100 MCG tablet Take 100 mcg by mouth every morning. 06/04/20   [provider]  loteprednol (LOTEMAX) 0.5 % ophthalmic suspension  09/18/21   [provider]  Misc Natural Products (GLUCOSAMINE CHOND COMPLEX/MSM PO) Take 1 tablet by mouth daily.    [provider]  Multiple Vitamins-Minerals (PRESERVISION AREDS PO) Take 1 tablet by mouth 2 (two) times daily.    [provider]  oxymetazoline (AFRIN 12 HOUR) 0.05 % nasal spray Place 2 sprays into both nostrils 2 (two) times daily as needed for congestion. 09/16/20   [provider]  pantoprazole (PROTONIX) 40 MG tablet Take 40 mg by mouth daily. 06/04/20   [provider]  pregabalin (LYRICA) 75 MG capsule Take 75 mg by mouth at bedtime.    [provider]  Ranibizumab (LUCENTIS) 0.3 MG/0.05ML SOLN every 8 (eight) weeks. 07/27/18   [provider]  rOPINIRole (REQUIP) 2 MG tablet Take 1/2 tablet po q hs 03/06/20   [provider]  rosuvastatin (CRESTOR) 10 MG tablet Take 10 mg by mouth daily.  [provider]  rotigotine (NEUPRO) 2 MG/24HR Place 1 patch onto the skin daily. 06/02/21   Penumalli, Earlean Polka, MD  saw palmetto 500 MG capsule Take 500 mg by mouth 2 (two) times daily.    [provider]  tamsulosin (FLOMAX) 0.4 MG CAPS capsule Take 0.4 mg by mouth daily.    [provider]  vitamin B-12 (CYANOCOBALAMIN) 1000 MCG tablet Take 1 tablet (1,000 mcg total) by mouth daily. 07/09/21   Orson Slick, MD  warfarin (COUMADIN) 2 MG tablet Take by mouth daily. 05/13/21   [provider]      Allergies     Iodinated contrast media    Review of Systems   Review of Systems  Constitutional:  Negative for appetite change, chills and fever.  HENT:  Positive for nosebleeds. Negative for ear pain, rhinorrhea, sneezing and sore throat.   Eyes:  Negative for photophobia and visual disturbance.  Respiratory:  Negative for cough, chest tightness, shortness of breath and wheezing.   Cardiovascular:  Negative for chest pain and palpitations.  Gastrointestinal:  Negative for abdominal pain, blood in stool, constipation, diarrhea, nausea and vomiting.  Genitourinary:  Negative for dysuria, hematuria and urgency.  Musculoskeletal:  Negative for myalgias.  Skin:  Negative for rash.  Neurological:  Negative for dizziness, weakness and light-headedness.   Physical Exam Updated Vital Signs BP 133/68    Pulse 67    Temp 97.6 F (36.4 C) (Oral)    Resp 16    Ht '6\' 2"'$  (1.88 m)    Wt 83.9 kg    SpO2 97%    BMI 23.75 kg/m  Physical Exam Vitals and nursing note reviewed.  Constitutional:      General: He is not in acute distress.    Appearance: He is well-developed.  HENT:     Head: Normocephalic and atraumatic.     Nose:     Left Nostril: Epistaxis present.  Eyes:     General: No scleral icterus.       Right eye: No discharge.        Left eye: No discharge.     Conjunctiva/sclera: Conjunctivae normal.  Cardiovascular:     Rate and Rhythm: Normal rate and regular rhythm.     Heart sounds: Normal heart sounds. No murmur heard.   No friction rub. No gallop.  Pulmonary:     Effort: Pulmonary effort is normal. No respiratory distress.     Breath sounds: Normal breath sounds.  Abdominal:     General: Bowel sounds are normal. There is no distension.     Palpations: Abdomen is soft.     Tenderness: There is no abdominal tenderness. There is no guarding.  Musculoskeletal:        General: Normal range of motion.     Cervical back: Normal range of motion and neck supple.  Skin:    General: Skin is warm  and dry.     Findings: No rash.  Neurological:     Mental Status: He is alert.     Motor: No abnormal muscle tone.     Coordination: Coordination normal.    ED Results / Procedures / Treatments   Labs (all labs ordered are listed, but only abnormal results are displayed) Labs Reviewed  CBC - Abnormal; Notable for the following components:      Result Value   RBC 3.10 (*)    Hemoglobin 10.7 (*)    HCT 32.4 (*)    MCV  104.5 (*)    MCH 34.5 (*)    Platelets 86 (*)    All other components within normal limits  PROTIME-INR - Abnormal; Notable for the following components:   Prothrombin Time 28.8 (*)    INR 2.7 (*)    All other components within normal limits  COMPREHENSIVE METABOLIC PANEL - Abnormal; Notable for the following components:   Glucose, Bld 122 (*)    BUN 47 (*)    Creatinine, Ser 2.35 (*)    Alkaline Phosphatase 173 (*)    GFR, Estimated 26 (*)    All other components within normal limits    EKG None  Radiology No results found.  Procedures .Epistaxis Management  Date/Time: 11/17/2021 10:10 AM Performed by: Delia Heady, PA-C Authorized by: Delia Heady, PA-C   Consent:    Consent obtained:  Verbal   Consent given by:  Patient   Risks discussed:  Bleeding, infection, nasal injury and pain   Alternatives discussed:  No treatment, delayed treatment and observation Universal protocol:    Patient identity confirmed:  Verbally with patient Procedure details:    Treatment site:  L septum   Treatment method:  Silver nitrate Post-procedure details:    Assessment:  Bleeding stopped   Procedure completion:  Tolerated well, no immediate complications    Medications Ordered in ED Medications  oxymetazoline (AFRIN) 0.05 % nasal spray 3 spray (3 sprays Each Nare Given 11/17/21 0639)  silver nitrate applicators applicator 1 Stick (1 Stick Topical Given 11/17/21 1012)    ED Course/ Medical Decision Making/ A&P Clinical Course as of 11/17/21 1317  Mon Nov 17, 2021  0717 INR(!): 2.7 [HK]  0719 Creatinine(!): 2.35 Baseline [HK]  0731 Bleeding has significantly improved, still slow oozing at this time we will continue to hold pressure [HK]    Clinical Course User Index [HK] Delia Heady, PA-C                           Medical Decision Making Amount and/or Complexity of Data Reviewed Labs: ordered. Decision-making details documented in ED Course.  Risk Prescription drug management.   88 year old male currently anticoagulated on Coumadin presenting to the ED for epistaxis.  He has a history of recurrent epistaxis although this is his first 1 in about a month.  His lab work is reassuring and his INR is 2.7 his creatinine is at baseline.  His hemoglobin is at baseline as well.  He is currently bleeding from the left nare. Attempted Afrin here without improvement. Held pressure for quite some time but patient concerned that he is still bleeding. There is blood noted in in the nare but I do not see any active bleeding. However patient continues to dab blood from his nose. We tried Afrin and continued pressure but patient reports similar bleeding with dabbing the area. Eventually cauterized the area with improvement.  However patient reports continued oozing.  I had a long discussion with the patient regarding temporary packing with gauze versus packing with Rhino Rocket or other longer term treatment.  Patient is adamant that he does not want packing in his nose due to discomfort and "my birthday coming up in my family visiting."  States that he would rather has gauze placed in the area that he can change out if his bleeding continues.  I do think that his bleeding has significantly improved since arrival here and while I do not think packing is necessarily warranted I feel  that because there is still a slight ooze from him manipulating the area it may improve this.  However he again declines this.  He knows to return for any worsening symptoms.  He remains  hemodynamically stable here Patient discussed with and seen by the attending, Dr. Billy Fischer   Patient is hemodynamically stable, in NAD, and able to ambulate in the ED. Evaluation does not show pathology that would require ongoing emergent intervention or inpatient treatment. I explained the diagnosis to the patient. Pain has been managed and has no complaints prior to discharge. Patient is comfortable with above plan and is stable for discharge at this time. All questions were answered prior to disposition. Strict return precautions for returning to the ED were discussed. Encouraged follow up with PCP.   An After Visit Summary was printed and given to the patient.   Portions of this note were generated with Lobby[employer]. Dictation errors may occur despite best attempts at proofreading.         Final Clinical Impression(s) / ED Diagnoses Final diagnoses:  Left-sided epistaxis    Rx / DC Orders ED Discharge Orders     None         Delia Heady, PA-C 11/17/21 1317    Gareth Morgan, MD 11/17/21 2326

## 2021-11-17 NOTE — Discharge Instructions (Addendum)
I have placed gauze in your nare to help with bleeding.  We discussed nasal packing and if you decide that you would like nasal packing if bleeding continues you can return to the ER at any time. ?Follow-up with your ENT doctor. ?Return to the ER for worsening bleeding, lightheadedness, chest pain or shortness of breath. ?

## 2021-11-17 NOTE — ED Notes (Signed)
Pt educated to not blow his nose, touch his nose or put anything up his nose d/t it starting bleeding again. Pt is resistant to education and denies doing anything with his nose after pt educated to not mess with his nose after sliver nitrate application by PA. Placed nose clamp back on nose and instructed pt to not touch his nose. PA aware.  ?

## 2021-11-17 NOTE — ED Notes (Signed)
No bleeding noted after silver nitrate application. Pt educated to not mess with his nose. Pt verbalized understanding. ?

## 2021-11-17 NOTE — ED Notes (Signed)
ENT cart placed at bedside at this time ?

## 2021-11-17 NOTE — ED Notes (Signed)
Notified provider that pts nose is still bleeding and that I placed the nose clip back on pt's nose ?

## 2021-11-17 NOTE — ED Notes (Signed)
Reviewed discharge instructions with patient. Follow-up care reviewed. Patient verbalized understanding. Patient A&Ox4, VSS, and ambulatory upon discharge.  ?

## 2021-11-17 NOTE — ED Triage Notes (Signed)
BIB [facility] from [facility] for nose bleed. Pt is bleeding from bilat nares. Pt is on blood thinners. Staff at [employer] used Afrin x 3. Bleeding for approximately 1 hr. ?

## 2021-11-21 NOTE — Progress Notes (Signed)
?[facility] - Clinic Note ? ?11/26/2021 ? ?  ? ?CHIEF COMPLAINT ?Patient presents for Retina Follow Up ? ? ? ?HISTORY OF PRESENT ILLNESS: ?Steven Williams is a 88 y.o. male who presents to the clinic today for:  ? ?HPI   ? ? Retina Follow Up   ?Patient presents with  Wet AMD.  In left eye.  Severity is moderate.  Duration of 4 weeks.  Since onset it is stable.  I, the attending physician,  performed the HPI with the patient and updated documentation appropriately. ? ?  ?  ? ? Comments   ?Pt here for 4 wk ret f/u exu [facility] OS. Pt states VA is about the same, some days better than others. Pt did not sleep well last night due to restless leg syndrome, feeling tired today.  ? ?  ?  ?Last edited by Zamora, Brian, MD on 11/29/2021  2:24 AM.  ?  ?Pt states he did not get any sleep 2 nights ago bc of restless leg syndrome, he states his vision fluctuates depending on how dry his eyes are ? ?Referring physician: ? ? ?HISTORICAL INFORMATION:  ? ?Selected notes from the medical record:  ?Referred by [facility], [place]  ? ?CURRENT MEDICATIONS: ?Current Outpatient Medications (Ophthalmic Drugs)  ?Medication Sig  ? loteprednol (LOTEMAX) 0.5 % ophthalmic suspension   ? Ranibizumab (LUCENTIS) 0.3 MG/0.05ML SOLN every 8 (eight) weeks.  ? ?No current facility-administered medications for this visit. (Ophthalmic Drugs)  ? ?Current Outpatient Medications (Other)  ?Medication Sig  ? acetaminophen (TYLENOL) 500 MG tablet Take 1,000 mg by mouth daily.  ? allopurinol (ZYLOPRIM) 100 MG tablet Take 1 tablet by mouth daily.  ? carvedilol (COREG) 6.25 MG tablet Take 6.25 mg by mouth 2 (two) times daily with a meal.  ? Cholecalciferol (VITAMIN D3 PO) Take 1 tablet by mouth in the morning and at bedtime.  ? dicyclomine (BENTYL) 10 MG capsule Take 30 mg by mouth in the morning and at bedtime.  ? digoxin (LANOXIN) 0.125 MG tablet Take 0.125 mg by mouth every Monday, Wednesday, and Friday.  ? Ferrous Sulfate (IRON) 325  (65 Fe) MG TABS Take 1 tablet by mouth 2 (two) times daily.  ? fluticasone (FLONASE) 50 MCG/ACT nasal spray Administer 2 sprays in the right nostril daily.  ? furosemide (LASIX) 40 MG tablet Take 40 mg by mouth daily.  ? hydrocortisone (ANUSOL-HC) 2.5 % rectal cream Apply 1 application topically 2 (two) times daily as needed for hemorrhoids or anal itching.  ? levothyroxine (SYNTHROID) 100 MCG tablet Take 100 mcg by mouth every morning.  ? Misc Natural Products (GLUCOSAMINE CHOND COMPLEX/MSM PO) Take 1 tablet by mouth daily.  ? Multiple Vitamins-Minerals (PRESERVISION AREDS PO) Take 1 tablet by mouth 2 (two) times daily.  ? oxymetazoline (AFRIN 12 HOUR) 0.05 % nasal spray Place 2 sprays into both nostrils 2 (two) times daily as needed for congestion.  ? pantoprazole (PROTONIX) 40 MG tablet Take 40 mg by mouth daily.  ? pregabalin (LYRICA) 75 MG capsule Take 75 mg by mouth at bedtime.  ? rOPINIRole (REQUIP) 2 MG tablet Take 1/2 tablet po q hs  ? rosuvastatin (CRESTOR) 10 MG tablet Take 10 mg by mouth daily.  ? rotigotine (NEUPRO) 2 MG/24HR Place 1 patch onto the skin daily.  ? saw palmetto 500 MG capsule Take 500 mg by mouth 2 (two) times daily.  ? tamsulosin (FLOMAX) 0.4 MG CAPS capsule Take 0.4 mg by mouth daily.  ?   vitamin B-12 (CYANOCOBALAMIN) 1000 MCG tablet Take 1 tablet (1,000 mcg total) by mouth daily.  ? warfarin (COUMADIN) 2 MG tablet Take by mouth daily.  ? ?Current Facility-Administered Medications (Other)  ?Medication Route  ? Bevacizumab (AVASTIN) SOLN 1.25 mg Intravitreal  ? diphenhydrAMINE (BENADRYL) capsule 50 mg Oral  ? ?REVIEW OF SYSTEMS: ?ROS   ?Positive for: Gastrointestinal, Genitourinary, Musculoskeletal, Cardiovascular, Eyes, Respiratory ?Negative for: Constitutional, Neurological, Skin, HENT, Endocrine, Psychiatric, Allergic/Imm, Heme/Lymph ?Last edited by Simpson, Makenzie E, COT on 11/26/2021  1:40 PM.  ?  ? ?ALLERGIES ?Allergies  ?Allergen Reactions  ? Iodinated Contrast Media Rash  ?   Other reaction(s): rash  ? ?PAST MEDICAL HISTORY ?Past Medical History:  ?Diagnosis Date  ? Anticoagulated 06/26/2020  ? Antiphospholipid antibody positive   ? Aortic aneurysm (HCC)   ? thoracic, Dr Hendrickson  ? Aortic dissection (HCC)   ? BPH (benign prostatic hyperplasia)   ? Central retinal artery occlusion   ? Chronic anemia   ? Chronic renal insufficiency   ? COPD (chronic obstructive pulmonary disease) (HCC)   ? DDD (degenerative disc disease), cervical   ? DJD (degenerative joint disease)   ? DVT (deep venous thrombosis) (HCC)   ? Gastric dysmotility   ? GERD (gastroesophageal reflux disease)   ? Gout   ? HTN (hypertension)   ? Hyperlipidemia   ? Hypertensive retinopathy   ? OU  ? Hypothyroid   ? Macular degeneration   ? Myelodysplasia (myelodysplastic syndrome) (HCC)   ? Nasal septal deviation 06/26/2020  ? Paget's disease of bone   ? Peripheral neuropathy   ? Permanent atrial fibrillation (HCC)   ? Recurrent epistaxis 06/26/2020  ? Rheumatoid arthritis (HCC)   ? Symptomatic bradycardia   ? TIA (transient ischemic attack)   ? ?Past Surgical History:  ?Procedure Laterality Date  ? ATRIAL FIBRILLATION ABLATION  09/23/2014  ? Dr Mandrola in [place]  ? BACK SURGERY    ? lumbar, x 2  ? CAROTID ENDARTERECTOMY Right 1990  ? CAROTID STENT    ? CATARACT EXTRACTION Bilateral 2010  ? [place]  ? EYE SURGERY Bilateral 2010  ? Cat Sx  ? LUNG DECORTICATION    ? VATS  ? PACEMAKER GENERATOR CHANGE  12/03/2011  ? MDT Adapta [id number] generator change by Dr Mandrola for symptomatic bradycardia  ? PACEMAKER IMPLANT  2004  ? MDT   ? ?FAMILY HISTORY ?Family History  ?Problem Relation Age of Onset  ? Cirrhosis Mother   ? Stroke Father   ? Lung cancer Sister   ? Lung cancer Brother   ? ?SOCIAL HISTORY ?Social History  ? ?Tobacco Use  ? Smoking status: Former  ?  Packs/day: 2.00  ?  Years: 12.00  ?  Pack years: 24.00  ?  Types: Cigarettes  ?  Quit date: 09/07/1962  ?  Years since quitting: 59.2  ? Smokeless tobacco: Never  ?Vaping Use  ?  Vaping Use: Never used  ?Substance Use Topics  ? Alcohol use: Yes  ?  Alcohol/week: 2.0 standard drinks  ?  Types: 2 Glasses of wine per week  ?  Comment: daily  ? Drug use: Never  ?  ? ?  ?OPHTHALMIC EXAM: ?Base Eye Exam   ? ? Visual Acuity (Snellen - Linear)   ? ?   Right Left  ? Dist cc CF at 3' 20/70  ? Dist ph cc NI NI  ? ? Correction: Glasses  ?Pt was falling asleep during acuity. MS ? ?  ?  ? ?   Tonometry (Tonopen, 1:52 PM)   ? ?   Right Left  ? Pressure 14 16  ? ?  ?  ? ? Pupils   ? ?   Dark Light Shape React APD  ? Right 2 1 Round Minimal None  ? Left 2 1 Round Minimal None  ? ?  ?  ? ? Visual Fields (Counting fingers)   ? ?   Left Right  ?   Full  ? Restrictions Partial outer inferior temporal deficiency   ? ?  ?  ? ? Extraocular Movement   ? ?   Right Left  ?  Full, Ortho Full, Ortho  ? ?  ?  ? ? Neuro/Psych   ? ? Oriented x3: Yes  ? Mood/Affect: Normal  ? ?  ?  ? ? Dilation   ? ? Both eyes: 1.0% Mydriacyl, 2.5% Phenylephrine @ 1:55 PM  ? ?  ?  ? ?  ? ?Slit Lamp and Fundus Exam   ? ? Slit Lamp Exam   ? ?   Right Left  ? Lids/Lashes Dermatochalasis - upper lid, Meibomian gland dysfunction Dermatochalasis - upper lid, Meibomian gland dysfunction  ? Conjunctiva/Sclera White and quiet White and quiet, +mucus  ? Cornea Arcus, 2-3+inferior Punctate epithelial erosions, Well healed cataract wounds Arcus, 2-3+inferior Punctate epithelial erosions centrally, Well healed cataract wounds, mild endo pigment  ? Anterior Chamber Deep and quiet Deep and quiet  ? Iris Round and moderately dilated Round and moderately dilated  ? Lens Posterior chamber intraocular lens, 1+ non-central Posterior capsular opacification Posterior chamber intraocular lens, open PC  ? Anterior Vitreous Vitreous syneresis, Posterior vitreous detachment Vitreous syneresis  ? ?  ?  ? ? Fundus Exam   ? ?   Right Left  ? Disc +cupping, trace pallor, thin superior rim, 360 PPA mild pallor, 360 PPA, +cupping  ? C/D Ratio 0.7 0.7  ? Macula Flat, diffuse  RPE atrophy, central pigment clumping, No heme or edema Flat, Blunted foveal reflex, Drusen, RPE mottling, clumping and atrophy, No edema, trace cystic changes -- stably improved, peripapillary GA nasal m

## 2021-11-24 ENCOUNTER — Ambulatory Visit
Admission: RE | Admit: 2021-11-24 | Discharge: 2021-11-24 | Disposition: A | Discharge status: Home / Self Care | Payer: Medicare Other | Source: Ambulatory Visit | Attending: Thoracic Surgery (Cardiothoracic Vascular Surgery) | Admitting: Thoracic Surgery (Cardiothoracic Vascular Surgery)

## 2021-11-24 ENCOUNTER — Other Ambulatory Visit: Payer: Self-pay

## 2021-11-24 ENCOUNTER — Ambulatory Visit (INDEPENDENT_AMBULATORY_CARE_PROVIDER_SITE_OTHER): Payer: Medicare Other | Admitting: Surgical

## 2021-11-24 VITALS — BP 104/60 | HR 61 | Resp 20 | Ht 74.0 in | Wt 197.0 lb

## 2021-11-24 DIAGNOSIS — I7123 Aneurysm of the descending thoracic aorta, without rupture: Secondary | ICD-10-CM

## 2021-11-24 DIAGNOSIS — I712 Thoracic aortic aneurysm, without rupture, unspecified: Secondary | ICD-10-CM | POA: Diagnosis not present

## 2021-11-24 NOTE — Progress Notes (Signed)
? ? ? ?Subjective:  ? ? Patient ID: Steven Williams, male    DOB: 06-15-1930, 88 y.o.   MRN: [id number] ? ?Chief Complaint  ?Patient presents with  ? Thoracic Aortic Aneurysm  ?  6 month f/u with CT chest today   ? ? ?HPI ?Patient is in today for follow-up of his thoracic aortic aneurysms including both ascending and descending component.  He remains relatively asymptomatic with only occasional shortness of breath which resolves fairly quickly with rest.  He also has occasional palpitations but does have history of atrial fibrillation.  He does not have any chest pain.  He has some lower extremity edema but does have chronic venous stasis disease.  He exercises regularly at his assisted living community and in general feels pretty well and is maintaining his current lifestyle. ? ?Past Medical History:  ?Diagnosis Date  ? Anticoagulated 06/26/2020  ? Antiphospholipid antibody positive   ? Aortic aneurysm ([place])   ? thoracic, Dr Roxan Hockey  ? Aortic dissection ([place])   ? BPH (benign prostatic hyperplasia)   ? Central retinal artery occlusion   ? Chronic anemia   ? Chronic renal insufficiency   ? COPD (chronic obstructive pulmonary disease) ([place])   ? DDD (degenerative disc disease), cervical   ? DJD (degenerative joint disease)   ? DVT (deep venous thrombosis) ([place])   ? Gastric dysmotility   ? GERD (gastroesophageal reflux disease)   ? Gout   ? HTN (hypertension)   ? Hyperlipidemia   ? Hypertensive retinopathy   ? OU  ? Hypothyroid   ? Macular degeneration   ? Myelodysplasia (myelodysplastic syndrome) ([place])   ? Nasal septal deviation 06/26/2020  ? Paget's disease of bone   ? Peripheral neuropathy   ? Permanent atrial fibrillation ([place])   ? Recurrent epistaxis 06/26/2020  ? Rheumatoid arthritis ([place])   ? Symptomatic bradycardia   ? TIA (transient ischemic attack)   ? ? ?Past Surgical History:  ?Procedure Laterality Date  ? ATRIAL FIBRILLATION ABLATION  09/23/2014  ? Dr Encarnacion Chu in [place]  ? BACK SURGERY    ? lumbar, x 2  ?  CAROTID ENDARTERECTOMY Right 1990  ? CAROTID STENT    ? CATARACT EXTRACTION Bilateral 2010  ? [place]  ? EYE SURGERY Bilateral 2010  ? Cat Sx  ? LUNG DECORTICATION    ? VATS  ? PACEMAKER GENERATOR CHANGE  12/03/2011  ? MDT Adapta ADDR01 generator change by Dr Encarnacion Chu for symptomatic bradycardia  ? PACEMAKER IMPLANT  2004  ? MDT   ? ? ?Family History  ?Problem Relation Age of Onset  ? Cirrhosis Mother   ? Stroke Father   ? Lung cancer Sister   ? Lung cancer Brother   ? ? ?Social History  ? ?Socioeconomic History  ? Marital status: Widowed  ?  Spouse name: Not on file  ? Number of children: 3  ? Years of education: Not on file  ? Highest education level: Bachelor's degree (e.g., BA, AB, BS)  ?Occupational History  ? Occupation: retired  ?  Comment: retired ME  ?Tobacco Use  ? Smoking status: Former  ?  Packs/day: 2.00  ?  Years: 12.00  ?  Pack years: 24.00  ?  Types: Cigarettes  ?  Quit date: 09/07/1962  ?  Years since quitting: 59.2  ? Smokeless tobacco: Never  ?Vaping Use  ? Vaping Use: Never used  ?Substance and Sexual Activity  ? Alcohol use: Yes  ?  Alcohol/week: 2.0 standard drinks  ?  Types: 2 Glasses of wine per week  ?  Comment: daily  ? Drug use: Never  ? Sexual activity: Not Currently  ?Other Topics Concern  ? Not on file  ?Social History Narrative  ? Previously lived in [place]  ? 03/12/20 Now lives in [place] ([place]) [place]  ? Retired Chief Financial Officer  ? Caffeine 2 a day  ? ?Social Determinants of Health  ? ?Financial Resource Strain: Not on file  ?Food Insecurity: Not on file  ?Transportation Needs: Not on file  ?Physical Activity: Not on file  ?Stress: Not on file  ?Social Connections: Not on file  ?Intimate Partner Violence: Not on file  ? ? ?Outpatient Medications Prior to Visit  ?Medication Sig Dispense Refill  ? acetaminophen (TYLENOL) 500 MG tablet Take 1,000 mg by mouth daily.    ? allopurinol (ZYLOPRIM) 100 MG tablet Take 1 tablet by mouth daily.    ? carvedilol (COREG) 6.25 MG tablet Take 6.25 mg by  mouth 2 (two) times daily with a meal.    ? Cholecalciferol (VITAMIN D3 PO) Take 1 tablet by mouth in the morning and at bedtime.    ? dicyclomine (BENTYL) 10 MG capsule Take 30 mg by mouth in the morning and at bedtime.    ? digoxin (LANOXIN) 0.125 MG tablet Take 0.125 mg by mouth every Monday, Wednesday, and Friday.    ? Ferrous Sulfate (IRON) 325 (65 Fe) MG TABS Take 1 tablet by mouth 2 (two) times daily.    ? fluticasone (FLONASE) 50 MCG/ACT nasal spray Administer 2 sprays in the right nostril daily.    ? furosemide (LASIX) 40 MG tablet Take 40 mg by mouth daily.    ? hydrocortisone (ANUSOL-HC) 2.5 % rectal cream Apply 1 application topically 2 (two) times daily as needed for hemorrhoids or anal itching.    ? levothyroxine (SYNTHROID) 100 MCG tablet Take 100 mcg by mouth every morning.    ? loteprednol (LOTEMAX) 0.5 % ophthalmic suspension     ? Misc Natural Products (GLUCOSAMINE CHOND COMPLEX/MSM PO) Take 1 tablet by mouth daily.    ? Multiple Vitamins-Minerals (PRESERVISION AREDS PO) Take 1 tablet by mouth 2 (two) times daily.    ? oxymetazoline (AFRIN 12 HOUR) 0.05 % nasal spray Place 2 sprays into both nostrils 2 (two) times daily as needed for congestion.    ? pantoprazole (PROTONIX) 40 MG tablet Take 40 mg by mouth daily.    ? pregabalin (LYRICA) 75 MG capsule Take 75 mg by mouth at bedtime.    ? Ranibizumab (LUCENTIS) 0.3 MG/0.05ML SOLN every 8 (eight) weeks.    ? rOPINIRole (REQUIP) 2 MG tablet Take 1/2 tablet po q hs    ? rosuvastatin (CRESTOR) 10 MG tablet Take 10 mg by mouth daily.    ? rotigotine (NEUPRO) 2 MG/24HR Place 1 patch onto the skin daily. 30 patch 12  ? saw palmetto 500 MG capsule Take 500 mg by mouth 2 (two) times daily.    ? tamsulosin (FLOMAX) 0.4 MG CAPS capsule Take 0.4 mg by mouth daily.    ? vitamin B-12 (CYANOCOBALAMIN) 1000 MCG tablet Take 1 tablet (1,000 mcg total) by mouth daily. 30 tablet 3  ? warfarin (COUMADIN) 2 MG tablet Take by mouth daily.    ? allopurinol (ZYLOPRIM) 100  MG tablet Take 100 mg by mouth daily.    ? ?Facility-Administered Medications Prior to Visit  ?Medication Dose Route Frequency Provider Last Rate Last Admin  ? Bevacizumab (AVASTIN) SOLN 1.25 mg  1.25  mg Intravitreal  [name], MD   1.25 mg at 11/01/18 2343  ? diphenhydrAMINE (BENADRYL) capsule 50 mg  50 mg Oral Once Logan Bores, MD      ? ? ?Allergies  ?Allergen Reactions  ? Iodinated Contrast Media Rash  ?  Other reaction(s): rash  ? ? ?ROS: As per HPI ? ?   ?Objective:  ?  ?Physical Exam ?Constitutional:   ?   General: He is not in acute distress. ?   Appearance: Normal appearance.  ?HENT:  ?   Head: Normocephalic and atraumatic.  ?Cardiovascular:  ?   Rate and Rhythm: Normal rate and regular rhythm.  ?   Heart sounds: Murmur heard.  ?Pulmonary:  ?   Effort: Pulmonary effort is normal.  ?   Breath sounds: Normal breath sounds.  ?Abdominal:  ?   General: Abdomen is flat. There is no distension.  ?   Palpations: Abdomen is soft. There is no mass.  ?Musculoskeletal:  ?   Cervical back: Normal range of motion.  ?   Right lower leg: Edema present.  ?   Left lower leg: Edema present.  ?   Comments: Chronic venous stasis changes  ?Neurological:  ?   General: No focal deficit present.  ?   Mental Status: He is alert and oriented to person, place, and time.  ?Psychiatric:     ?   Mood and Affect: Mood normal.     ?   Behavior: Behavior normal.  ? ? ?BP 104/60 (BP Location: Right Arm, Patient Position: Sitting)   Pulse 61   Resp 20   Ht '6\' 2"'$  (1.88 m)   Wt 197 lb (89.4 kg)   SpO2 91% Comment: RA  BMI 25.29 kg/m?  ?Wt Readings from Last 3 Encounters:  ?11/24/21 197 lb (89.4 kg)  ?11/17/21 185 lb (83.9 kg)  ?11/10/21 196 lb 11.2 oz (89.2 kg)  ? ? ?Health Maintenance Due  ?Topic Date Due  ? TETANUS/TDAP  Never done  ? Zoster Vaccines- Shingrix (1 of 2) Never done  ? Pneumonia Vaccine 10+ Years old (1 - PCV) Never done  ? COVID-19 Vaccine (5 - Booster for Moderna series) 03/13/2021  ? ? ?There are no preventive  care reminders to display for this patient. ? ? ?Lab Results  ?Component Value Date  ? TSH 3.876 06/23/2021  ? ?Lab Results  ?Component Value Date  ? WBC 4.7 11/17/2021  ? HGB 10.7 (L) 11/17/2021  ? HCT 32.4 (L)

## 2021-11-24 NOTE — Patient Instructions (Signed)
Discussed activity and lifestyle management.  Discussed medication management. ?

## 2021-11-26 ENCOUNTER — Ambulatory Visit (INDEPENDENT_AMBULATORY_CARE_PROVIDER_SITE_OTHER): Payer: Medicare Other | Admitting: Ophthalmology

## 2021-11-26 ENCOUNTER — Encounter (INDEPENDENT_AMBULATORY_CARE_PROVIDER_SITE_OTHER): Payer: Self-pay | Admitting: Ophthalmology

## 2021-11-26 ENCOUNTER — Other Ambulatory Visit: Payer: Self-pay

## 2021-11-26 DIAGNOSIS — I10 Essential (primary) hypertension: Secondary | ICD-10-CM | POA: Diagnosis not present

## 2021-11-26 DIAGNOSIS — H353221 Exudative age-related macular degeneration, left eye, with active choroidal neovascularization: Secondary | ICD-10-CM

## 2021-11-26 DIAGNOSIS — H353213 Exudative age-related macular degeneration, right eye, with inactive scar: Secondary | ICD-10-CM

## 2021-11-26 DIAGNOSIS — Z961 Presence of intraocular lens: Secondary | ICD-10-CM

## 2021-11-26 DIAGNOSIS — H35033 Hypertensive retinopathy, bilateral: Secondary | ICD-10-CM

## 2021-11-26 DIAGNOSIS — H04123 Dry eye syndrome of bilateral lacrimal glands: Secondary | ICD-10-CM

## 2021-11-29 ENCOUNTER — Encounter (INDEPENDENT_AMBULATORY_CARE_PROVIDER_SITE_OTHER): Payer: Self-pay | Admitting: Ophthalmology

## 2021-11-29 MED ORDER — AFLIBERCEPT 2MG/0.05ML IZ SOLN FOR KALEIDOSCOPE
2.0000 mg | INTRAVITREAL | Status: AC | PRN
  Administered 2021-11-29: 2 mg via INTRAVITREAL

## 2021-12-01 ENCOUNTER — Other Ambulatory Visit: Payer: Self-pay

## 2021-12-01 ENCOUNTER — Ambulatory Visit (INDEPENDENT_AMBULATORY_CARE_PROVIDER_SITE_OTHER): Payer: Medicare Other | Admitting: Diagnostic Neuroimaging

## 2021-12-01 ENCOUNTER — Encounter: Payer: Self-pay | Admitting: Diagnostic Neuroimaging

## 2021-12-01 VITALS — BP 122/80 | HR 75 | Ht 72.0 in | Wt 194.0 lb

## 2021-12-01 DIAGNOSIS — G629 Polyneuropathy, unspecified: Secondary | ICD-10-CM | POA: Diagnosis not present

## 2021-12-01 DIAGNOSIS — G2581 Restless legs syndrome: Secondary | ICD-10-CM | POA: Diagnosis not present

## 2021-12-01 NOTE — Progress Notes (Signed)
? ?[facility] ? ?PATIENT: Steven Williams ?DOB: 28-Oct-1929 ? ?REFERRING CLINICIAN: Ginger Organ., MD ?HISTORY FROM: patient  ?REASON FOR VISIT: follow up ? ? ?HISTORICAL ? ?CHIEF COMPLAINT:  ?Chief Complaint  ?Patient presents with  ? Follow-up  ?  RM 7 alone here for 6 month f/u. Reports sx have been worse since last f/u.   ? ? ?HISTORY OF PRESENT ILLNESS:  ? ?UPDATE (12/01/21, [facility]): Since last visit, doing about the same, but sometimes RLS are worse and sleep is worse. ? ?UPDATE (06/02/21, [facility]): Since last visit, doing worse with RLS. Sxs better in last few days. Worse in the evening. On lyrica and ropinirole.  ? ?PRIOR HPI (03/13/20): 88 year old male here for evaluation of neuropathy and restless leg syndrome. ? ?For past 5 years patient has had numbness and tingling in his toes and feet, progressively increasing up to his shins.  He also has history of low back surgeries x2.  Patient was diagnosed with neuropathy, but no specific cause was found.  He has been started on gabapentin, switched to Lyrica with mild relief.  Also was developing restless leg symptoms at nighttime and starting ropinirole which seems to have helped.  No numbness or tingling in fingers or hands. ? ? ? ?REVIEW OF SYSTEMS: Full 14 system review of systems performed and negative with exception of: As per HPI. ? ?ALLERGIES: ?Allergies  ?Allergen Reactions  ? Iodinated Contrast Media Rash  ?  Other reaction(s): rash  ? ? ?HOME MEDICATIONS: ?Outpatient Medications Prior to Visit  ?Medication Sig Dispense Refill  ? acetaminophen (TYLENOL) 500 MG tablet Take 1,000 mg by mouth daily.    ? allopurinol (ZYLOPRIM) 100 MG tablet Take 1 tablet by mouth daily.    ? carvedilol (COREG) 6.25 MG tablet Take 6.25 mg by mouth 2 (two) times daily with a meal.    ? Cholecalciferol (VITAMIN D3 PO) Take 1 tablet by mouth in the morning and at bedtime.    ? dicyclomine (BENTYL) 10 MG capsule Take 30 mg by mouth in the morning and at bedtime.     ? digoxin (LANOXIN) 0.125 MG tablet Take 0.125 mg by mouth every Monday, Wednesday, and Friday.    ? Ferrous Sulfate (IRON) 325 (65 Fe) MG TABS Take 1 tablet by mouth 2 (two) times daily.    ? fluticasone (FLONASE) 50 MCG/ACT nasal spray Administer 2 sprays in the right nostril daily.    ? furosemide (LASIX) 40 MG tablet Take 40 mg by mouth daily.    ? gabapentin (NEURONTIN) 400 MG capsule Take 400 mg by mouth at bedtime.    ? hydrocortisone (ANUSOL-HC) 2.5 % rectal cream Apply 1 application topically 2 (two) times daily as needed for hemorrhoids or anal itching.    ? levothyroxine (SYNTHROID) 100 MCG tablet Take 100 mcg by mouth every morning.    ? loteprednol (LOTEMAX) 0.5 % ophthalmic suspension     ? Misc Natural Products (GLUCOSAMINE CHOND COMPLEX/MSM PO) Take 1 tablet by mouth daily.    ? Multiple Vitamins-Minerals (PRESERVISION AREDS PO) Take 1 tablet by mouth 2 (two) times daily.    ? oxymetazoline (AFRIN 12 HOUR) 0.05 % nasal spray Place 2 sprays into both nostrils 2 (two) times daily as needed for congestion.    ? pantoprazole (PROTONIX) 40 MG tablet Take 40 mg by mouth daily.    ? pregabalin (LYRICA) 75 MG capsule Take 75 mg by mouth 2 (two) times daily.    ? Ranibizumab (LUCENTIS) 0.3  MG/0.05ML SOLN every 8 (eight) weeks.    ? rOPINIRole (REQUIP) 2 MG tablet Take 2 mg by mouth at bedtime.    ? rosuvastatin (CRESTOR) 10 MG tablet Take 10 mg by mouth daily.    ? saw palmetto 500 MG capsule Take 500 mg by mouth 2 (two) times daily.    ? tamsulosin (FLOMAX) 0.4 MG CAPS capsule Take 0.4 mg by mouth daily.    ? vitamin B-12 (CYANOCOBALAMIN) 1000 MCG tablet Take 1 tablet (1,000 mcg total) by mouth daily. 30 tablet 3  ? warfarin (COUMADIN) 2 MG tablet Take by mouth daily.    ? rotigotine (NEUPRO) 2 MG/24HR Place 1 patch onto the skin daily. (Patient not taking: Reported on 12/01/2021) 30 patch 12  ? ?Facility-Administered Medications Prior to Visit  ?Medication Dose Route Frequency Provider Last Rate Last Admin   ? Bevacizumab (AVASTIN) SOLN 1.25 mg  1.25 mg Intravitreal  [name], MD   1.25 mg at 11/01/18 2343  ? diphenhydrAMINE (BENADRYL) capsule 50 mg  50 mg Oral Once Logan Bores, MD      ? ? ?PAST MEDICAL HISTORY: ?Past Medical History:  ?Diagnosis Date  ? Anticoagulated 06/26/2020  ? Antiphospholipid antibody positive   ? Aortic aneurysm ([place])   ? thoracic, Dr Roxan Hockey  ? Aortic dissection ([place])   ? BPH (benign prostatic hyperplasia)   ? Central retinal artery occlusion   ? Chronic anemia   ? Chronic renal insufficiency   ? COPD (chronic obstructive pulmonary disease) ([place])   ? DDD (degenerative disc disease), cervical   ? DJD (degenerative joint disease)   ? DVT (deep venous thrombosis) ([place])   ? Gastric dysmotility   ? GERD (gastroesophageal reflux disease)   ? Gout   ? HTN (hypertension)   ? Hyperlipidemia   ? Hypertensive retinopathy   ? OU  ? Hypothyroid   ? Macular degeneration   ? Myelodysplasia (myelodysplastic syndrome) ([place])   ? Nasal septal deviation 06/26/2020  ? Paget's disease of bone   ? Peripheral neuropathy   ? Permanent atrial fibrillation ([place])   ? Recurrent epistaxis 06/26/2020  ? Rheumatoid arthritis ([place])   ? Symptomatic bradycardia   ? TIA (transient ischemic attack)   ? ? ?PAST SURGICAL HISTORY: ?Past Surgical History:  ?Procedure Laterality Date  ? ATRIAL FIBRILLATION ABLATION  09/23/2014  ? Dr Encarnacion Chu in [place]  ? BACK SURGERY    ? lumbar, x 2  ? CAROTID ENDARTERECTOMY Right 1990  ? CAROTID STENT    ? CATARACT EXTRACTION Bilateral 2010  ? [place]  ? EYE SURGERY Bilateral 2010  ? Cat Sx  ? LUNG DECORTICATION    ? VATS  ? PACEMAKER GENERATOR CHANGE  12/03/2011  ? MDT Adapta ADDR01 generator change by Dr Encarnacion Chu for symptomatic bradycardia  ? PACEMAKER IMPLANT  2004  ? MDT   ? ? ?FAMILY HISTORY: ?Family History  ?Problem Relation Age of Onset  ? Cirrhosis Mother   ? Stroke Father   ? Lung cancer Sister   ? Lung cancer Brother   ? ? ?SOCIAL HISTORY: ?Social History  ? ?Socioeconomic  History  ? Marital status: Widowed  ?  Spouse name: Not on file  ? Number of children: 3  ? Years of education: Not on file  ? Highest education level: Bachelor's degree (e.g., BA, AB, BS)  ?Occupational History  ? Occupation: retired  ?  Comment: retired ME  ?Tobacco Use  ? Smoking status: Former  ?  Packs/day: 2.00  ?  Years: 12.00  ?  Pack years: 24.00  ?  Types: Cigarettes  ?  Quit date: 09/07/1962  ?  Years since quitting: 59.2  ? Smokeless tobacco: Never  ?Vaping Use  ? Vaping Use: Never used  ?Substance and Sexual Activity  ? Alcohol use: Yes  ?  Alcohol/week: 2.0 standard drinks  ?  Types: 2 Glasses of wine per week  ?  Comment: daily  ? Drug use: Never  ? Sexual activity: Not Currently  ?Other Topics Concern  ? Not on file  ?Social History Narrative  ? Previously lived in [place]  ? 03/12/20 Now lives in [place] ([place]) [place]  ? Retired Chief Financial Officer  ? Caffeine 2 a day  ? ?Social Determinants of Health  ? ?Financial Resource Strain: Not on file  ?Food Insecurity: Not on file  ?Transportation Needs: Not on file  ?Physical Activity: Not on file  ?Stress: Not on file  ?Social Connections: Not on file  ?Intimate Partner Violence: Not on file  ? ? ? ?PHYSICAL EXAM ? ?GENERAL EXAM/CONSTITUTIONAL: ?Vitals:  ?Vitals:  ? 12/01/21 1312  ?BP: 122/80  ?Pulse: 75  ?SpO2: 97%  ?Weight: 194 lb (88 kg)  ?Height: 6' (1.829 m)  ? ?Body mass index is 26.31 kg/m?. ?Wt Readings from Last 3 Encounters:  ?12/01/21 194 lb (88 kg)  ?11/24/21 197 lb (89.4 kg)  ?11/17/21 185 lb (83.9 kg)  ? ?Patient is in no distress; well developed, nourished and groomed; neck is supple ? ?CARDIOVASCULAR: ?Examination of carotid arteries is normal; no carotid bruits ?Regular rate and rhythm, no murmurs ?Examination of peripheral vascular system by observation and palpation is normal ? ?EYES: ?Ophthalmoscopic exam of optic discs and posterior segments is normal; no papilledema or hemorrhages ?No results found. ? ?MUSCULOSKELETAL: ?Gait, strength,  tone, movements noted in Neurologic exam below ? ?NEUROLOGIC: ?MENTAL STATUS:  ?   ? View : No data to display.  ?  ?  ?  ? ?awake, alert, oriented to person, place and time ?recent and remote memory intact ?n

## 2021-12-10 ENCOUNTER — Ambulatory Visit (INDEPENDENT_AMBULATORY_CARE_PROVIDER_SITE_OTHER): Payer: Medicare Other

## 2021-12-10 DIAGNOSIS — G459 Transient cerebral ischemic attack, unspecified: Secondary | ICD-10-CM | POA: Diagnosis not present

## 2021-12-10 LAB — CUP PACEART REMOTE DEVICE CHECK
Battery Impedance: 3248 Ohm
Battery Remaining Longevity: 22 mo
Battery Voltage: 2.73 V
Brady Statistic RV Percent Paced: 21 %
Date Time Interrogation Session: 20230405143359
Implantable Lead Implant Date: 20041008
Implantable Lead Implant Date: 20041008
Implantable Lead Location: 753859
Implantable Lead Location: 753860
Implantable Lead Model: 5076
Implantable Lead Model: 5076
Implantable Pulse Generator Implant Date: 20130328
Lead Channel Impedance Value: 67 Ohm
Lead Channel Impedance Value: 908 Ohm
Lead Channel Setting Pacing Amplitude: 2.5 V
Lead Channel Setting Pacing Pulse Width: 1 ms
Lead Channel Setting Sensing Sensitivity: 4 mV

## 2021-12-25 NOTE — Progress Notes (Signed)
Remote pacemaker transmission.   

## 2022-01-09 ENCOUNTER — Inpatient Hospital Stay (HOSPITAL_COMMUNITY)
Admission: EM | Admit: 2022-01-09 | Discharge: 2022-01-11 | DRG: 151 | Disposition: A | Discharge status: Home Health | Payer: Medicare Other | Attending: Family Medicine | Admitting: Family Medicine

## 2022-01-09 ENCOUNTER — Other Ambulatory Visit: Payer: Self-pay

## 2022-01-09 ENCOUNTER — Encounter (HOSPITAL_COMMUNITY): Payer: Self-pay | Admitting: Internal Medicine

## 2022-01-09 DIAGNOSIS — Z8673 Personal history of transient ischemic attack (TIA), and cerebral infarction without residual deficits: Secondary | ICD-10-CM

## 2022-01-09 DIAGNOSIS — Z79899 Other long term (current) drug therapy: Secondary | ICD-10-CM

## 2022-01-09 DIAGNOSIS — Z801 Family history of malignant neoplasm of trachea, bronchus and lung: Secondary | ICD-10-CM

## 2022-01-09 DIAGNOSIS — I482 Chronic atrial fibrillation, unspecified: Secondary | ICD-10-CM | POA: Diagnosis present

## 2022-01-09 DIAGNOSIS — Z87891 Personal history of nicotine dependence: Secondary | ICD-10-CM

## 2022-01-09 DIAGNOSIS — I129 Hypertensive chronic kidney disease with stage 1 through stage 4 chronic kidney disease, or unspecified chronic kidney disease: Secondary | ICD-10-CM | POA: Diagnosis present

## 2022-01-09 DIAGNOSIS — D469 Myelodysplastic syndrome, unspecified: Secondary | ICD-10-CM | POA: Diagnosis present

## 2022-01-09 DIAGNOSIS — Z950 Presence of cardiac pacemaker: Secondary | ICD-10-CM

## 2022-01-09 DIAGNOSIS — Z823 Family history of stroke: Secondary | ICD-10-CM

## 2022-01-09 DIAGNOSIS — R040 Epistaxis: Principal | ICD-10-CM

## 2022-01-09 DIAGNOSIS — I480 Paroxysmal atrial fibrillation: Secondary | ICD-10-CM | POA: Diagnosis present

## 2022-01-09 DIAGNOSIS — N189 Chronic kidney disease, unspecified: Secondary | ICD-10-CM | POA: Diagnosis present

## 2022-01-09 DIAGNOSIS — N184 Chronic kidney disease, stage 4 (severe): Secondary | ICD-10-CM | POA: Diagnosis present

## 2022-01-09 DIAGNOSIS — K219 Gastro-esophageal reflux disease without esophagitis: Secondary | ICD-10-CM | POA: Diagnosis present

## 2022-01-09 DIAGNOSIS — E785 Hyperlipidemia, unspecified: Secondary | ICD-10-CM | POA: Diagnosis present

## 2022-01-09 DIAGNOSIS — Z66 Do not resuscitate: Secondary | ICD-10-CM | POA: Diagnosis present

## 2022-01-09 DIAGNOSIS — G2581 Restless legs syndrome: Secondary | ICD-10-CM | POA: Diagnosis present

## 2022-01-09 DIAGNOSIS — J449 Chronic obstructive pulmonary disease, unspecified: Secondary | ICD-10-CM | POA: Diagnosis present

## 2022-01-09 DIAGNOSIS — N400 Enlarged prostate without lower urinary tract symptoms: Secondary | ICD-10-CM | POA: Diagnosis present

## 2022-01-09 DIAGNOSIS — I4821 Permanent atrial fibrillation: Secondary | ICD-10-CM | POA: Diagnosis present

## 2022-01-09 DIAGNOSIS — M5030 Other cervical disc degeneration, unspecified cervical region: Secondary | ICD-10-CM | POA: Diagnosis present

## 2022-01-09 DIAGNOSIS — Z79890 Hormone replacement therapy (postmenopausal): Secondary | ICD-10-CM

## 2022-01-09 DIAGNOSIS — I714 Abdominal aortic aneurysm, without rupture, unspecified: Secondary | ICD-10-CM | POA: Diagnosis present

## 2022-01-09 DIAGNOSIS — G629 Polyneuropathy, unspecified: Secondary | ICD-10-CM | POA: Diagnosis present

## 2022-01-09 DIAGNOSIS — E039 Hypothyroidism, unspecified: Secondary | ICD-10-CM | POA: Diagnosis present

## 2022-01-09 DIAGNOSIS — Z7901 Long term (current) use of anticoagulants: Secondary | ICD-10-CM

## 2022-01-09 DIAGNOSIS — M069 Rheumatoid arthritis, unspecified: Secondary | ICD-10-CM | POA: Diagnosis present

## 2022-01-09 DIAGNOSIS — I10 Essential (primary) hypertension: Secondary | ICD-10-CM | POA: Diagnosis present

## 2022-01-09 DIAGNOSIS — M109 Gout, unspecified: Secondary | ICD-10-CM | POA: Diagnosis present

## 2022-01-09 LAB — BASIC METABOLIC PANEL
Anion gap: 11 (ref 5–15)
BUN: 54 mg/dL — ABNORMAL HIGH (ref 8–23)
CO2: 26 mmol/L (ref 22–32)
Calcium: 11 mg/dL — ABNORMAL HIGH (ref 8.9–10.3)
Chloride: 106 mmol/L (ref 98–111)
Creatinine, Ser: 2.57 mg/dL — ABNORMAL HIGH (ref 0.61–1.24)
GFR, Estimated: 23 mL/min — ABNORMAL LOW (ref >60)
Glucose, Bld: 117 mg/dL — ABNORMAL HIGH (ref 70–99)
Potassium: 3.7 mmol/L (ref 3.5–5.1)
Sodium: 143 mmol/L (ref 135–145)

## 2022-01-09 LAB — PROTIME-INR
INR: 2.7 — ABNORMAL HIGH (ref 0.8–1.2)
Prothrombin Time: 28.3 seconds — ABNORMAL HIGH (ref 11.4–15.2)

## 2022-01-09 LAB — CBC
HCT: 31.5 % — ABNORMAL LOW (ref 39.0–52.0)
Hemoglobin: 10.3 g/dL — ABNORMAL LOW (ref 13.0–17.0)
MCH: 34.1 pg — ABNORMAL HIGH (ref 26.0–34.0)
MCHC: 32.7 g/dL (ref 30.0–36.0)
MCV: 104.3 fL — ABNORMAL HIGH (ref 80.0–100.0)
Platelets: 87 10*3/uL — ABNORMAL LOW (ref 150–400)
RBC: 3.02 MIL/uL — ABNORMAL LOW (ref 4.22–5.81)
RDW: 13.5 % (ref 11.5–15.5)
WBC: 4.9 10*3/uL (ref 4.0–10.5)
nRBC: 0 % (ref 0.0–0.2)

## 2022-01-09 MED ORDER — OXYMETAZOLINE HCL 0.05 % NA SOLN
1.0000 | Freq: Once | NASAL | Status: AC
  Administered 2022-01-09: 1 via NASAL

## 2022-01-09 MED ORDER — LEVOTHYROXINE SODIUM 100 MCG PO TABS
100.0000 ug | ORAL_TABLET | Freq: Every morning | ORAL | Status: DC
  Administered 2022-01-10 – 2022-01-11 (×3): 100 ug via ORAL
  Filled 2022-01-09 (×4): qty 1

## 2022-01-09 MED ORDER — ROSUVASTATIN CALCIUM 5 MG PO TABS: 10.0000 mg | ORAL_TABLET | Freq: Every day | ORAL | Status: DC

## 2022-01-09 MED ORDER — ONDANSETRON HCL 4 MG/2ML IJ SOLN: 4.0000 mg | Freq: Four times a day (QID) | INTRAMUSCULAR | Status: DC | PRN

## 2022-01-09 MED ORDER — DICYCLOMINE HCL 10 MG PO CAPS
30.0000 mg | ORAL_CAPSULE | Freq: Two times a day (BID) | ORAL | Status: DC
  Administered 2022-01-10 – 2022-01-11 (×3): 30 mg via ORAL
  Filled 2022-01-09 (×4): qty 3

## 2022-01-09 MED ORDER — CARVEDILOL 6.25 MG PO TABS
6.2500 mg | ORAL_TABLET | Freq: Two times a day (BID) | ORAL | Status: DC
  Administered 2022-01-10 – 2022-01-11 (×3): 6.25 mg via ORAL
  Filled 2022-01-09 (×3): qty 1

## 2022-01-09 MED ORDER — PANTOPRAZOLE SODIUM 40 MG PO TBEC
40.0000 mg | DELAYED_RELEASE_TABLET | Freq: Every day | ORAL | Status: DC
  Administered 2022-01-10 – 2022-01-11 (×2): 40 mg via ORAL
  Filled 2022-01-09 (×2): qty 1

## 2022-01-09 MED ORDER — LACTATED RINGERS IV SOLN: INTRAVENOUS | Status: DC

## 2022-01-09 MED ORDER — PREGABALIN 75 MG PO CAPS
75.0000 mg | ORAL_CAPSULE | Freq: Two times a day (BID) | ORAL | Status: DC
  Administered 2022-01-10 – 2022-01-11 (×4): 75 mg via ORAL
  Filled 2022-01-09 (×4): qty 1

## 2022-01-09 MED ORDER — POLYVINYL ALCOHOL 1.4 % OP SOLN
1.0000 [drp] | Freq: Four times a day (QID) | OPHTHALMIC | Status: DC
Start: 2022-01-09 — End: 2022-01-11
  Administered 2022-01-10 – 2022-01-11 (×5): 1 [drp] via OPHTHALMIC
  Filled 2022-01-09: qty 15

## 2022-01-09 MED ORDER — ALLOPURINOL 100 MG PO TABS
100.0000 mg | ORAL_TABLET | Freq: Every day | ORAL | Status: DC
  Administered 2022-01-10 – 2022-01-11 (×2): 100 mg via ORAL
  Filled 2022-01-09 (×2): qty 1

## 2022-01-09 MED ORDER — TRANEXAMIC ACID FOR EPISTAXIS
500.0000 mg | Freq: Once | TOPICAL | Status: DC
  Filled 2022-01-09: qty 10

## 2022-01-09 MED ORDER — ACETAMINOPHEN 650 MG RE SUPP: 650.0000 mg | Freq: Four times a day (QID) | RECTAL | Status: DC | PRN

## 2022-01-09 MED ORDER — ROPINIROLE HCL 1 MG PO TABS
2.0000 mg | ORAL_TABLET | Freq: Every day | ORAL | Status: DC
  Administered 2022-01-10 (×2): 2 mg via ORAL
  Filled 2022-01-09 (×2): qty 2

## 2022-01-09 MED ORDER — PROSIGHT PO TABS
1.0000 | ORAL_TABLET | Freq: Every day | ORAL | Status: DC
  Administered 2022-01-10 – 2022-01-11 (×2): 1 via ORAL
  Filled 2022-01-09 (×2): qty 1

## 2022-01-09 MED ORDER — DIGOXIN 125 MCG PO TABS
0.1250 mg | ORAL_TABLET | ORAL | Status: DC
  Filled 2022-01-09: qty 1

## 2022-01-09 MED ORDER — LIDOCAINE HCL URETHRAL/MUCOSAL 2 % EX GEL
1.0000 "application " | Freq: Once | CUTANEOUS | Status: AC
  Administered 2022-01-09: 1 via TOPICAL
  Filled 2022-01-09: qty 11

## 2022-01-09 MED ORDER — POLYETHYLENE GLYCOL 3350 17 G PO PACK: 17.0000 g | PACK | Freq: Every day | ORAL | Status: DC | PRN

## 2022-01-09 MED ORDER — HYDRALAZINE HCL 20 MG/ML IJ SOLN: 5.0000 mg | INTRAMUSCULAR | Status: DC | PRN

## 2022-01-09 MED ORDER — ONDANSETRON HCL 4 MG PO TABS: 4.0000 mg | ORAL_TABLET | Freq: Four times a day (QID) | ORAL | Status: DC | PRN

## 2022-01-09 MED ORDER — BISACODYL 5 MG PO TBEC: 5.0000 mg | DELAYED_RELEASE_TABLET | Freq: Every day | ORAL | Status: DC | PRN

## 2022-01-09 MED ORDER — ACETAMINOPHEN 325 MG PO TABS: 650.0000 mg | ORAL_TABLET | Freq: Four times a day (QID) | ORAL | Status: DC | PRN

## 2022-01-09 MED ORDER — OXYMETAZOLINE HCL 0.05 % NA SOLN
2.0000 | Freq: Once | NASAL | Status: AC
  Administered 2022-01-09: 2 via NASAL
  Filled 2022-01-09: qty 30

## 2022-01-09 MED ORDER — ONDANSETRON 4 MG PO TBDP
4.0000 mg | ORAL_TABLET | Freq: Once | ORAL | Status: AC
  Administered 2022-01-09: 4 mg via ORAL
  Filled 2022-01-09: qty 1

## 2022-01-09 MED ORDER — FUROSEMIDE 20 MG PO TABS: 40.0000 mg | ORAL_TABLET | Freq: Every day | ORAL | Status: DC

## 2022-01-09 MED ORDER — RACEPINEPHRINE HCL 2.25 % IN NEBU
0.5000 mL | INHALATION_SOLUTION | Freq: Once | RESPIRATORY_TRACT | Status: AC
  Administered 2022-01-09: 0.5 mL via RESPIRATORY_TRACT
  Filled 2022-01-09: qty 0.5

## 2022-01-09 MED ORDER — TAMSULOSIN HCL 0.4 MG PO CAPS
0.4000 mg | ORAL_CAPSULE | Freq: Every day | ORAL | Status: DC
  Administered 2022-01-10 – 2022-01-11 (×2): 0.4 mg via ORAL
  Filled 2022-01-09 (×2): qty 1

## 2022-01-09 MED ORDER — PRESERVISION AREDS PO CAPS: ORAL_CAPSULE | Freq: Two times a day (BID) | ORAL | Status: DC

## 2022-01-09 MED ORDER — DOCUSATE SODIUM 100 MG PO CAPS
100.0000 mg | ORAL_CAPSULE | Freq: Two times a day (BID) | ORAL | Status: DC
  Administered 2022-01-10 – 2022-01-11 (×4): 100 mg via ORAL
  Filled 2022-01-09 (×4): qty 1

## 2022-01-09 MED ORDER — TRANEXAMIC ACID FOR EPISTAXIS
500.0000 mg | Freq: Once | TOPICAL | Status: AC
  Administered 2022-01-09: 500 mg via TOPICAL
  Filled 2022-01-09 (×2): qty 10

## 2022-01-09 NOTE — ED Notes (Signed)
The pt insisted  on  walking to the br to have a bm  while he was up his nose started to bleed again ?

## 2022-01-09 NOTE — H&P (Signed)
?History and Physical  ? ? ?Patient: Steven Williams OEV:[id number] DOB: [birth year] ?DOA: 01/09/2022 ?DOS: the patient was seen and examined on 01/09/2022 ?PCP: Ginger Organ., MD  ?Patient coming from: ALF/ILF - [facility]; NOK:  Daughter, Julious Payer, [phone] ? ? ?Chief Complaint: Epistaxis ? ?HPI: Steven Williams is a 88 y.o. male with medical history significant of AAA; BPH; COPD; HTN; HLD; pacemaker placement; hypothyroidism; MDS; afib on Coumadin; recurrent epistaxis; and RA presenting with epistaxis.  He was last seen by ENT (Dr. Fredric Dine) for this issue and seemed to be improved at that time.  He reports that he started with nosebleeds when he moved to [place] 3 years ago.  He was having recurrent bleeds and then tried vaseline which worked for a period of time.  He had another recent nosebleed and then started with this one last night and it just won't stop bleeding.  Packing was placed in the ER and he is now experiencing posterior bleeding and spitting clots.  It is very uncomfortable.   ? ?His daughter reports that he was in the hospital 4 times last summer because the epistaxis would not stop.  The Coumadin has been a better option until recently - ?allergies.   ? ?The patient and I discussed possible options including embolization and Coumadin reversal - both short-term and long-term. ?I called and left a message for Dr. Fredric Dine; Dr. Langston Masker already spoke with her. ? ?If also spoke with neurointerventional radiology - They only embolize if packing fails.  ENT usually tries packing before calling IR.  Dr. Kathee Delton is on call this weekend for embolization, if needed.   ? ? ? ?ER Course:  Afib with recurrent nosebleeds, here with persistent nosebleed.  Has had pressure, cauterization, Merocele.  Has improved but still oozing.  INR 2.7.  Talked with ENT - he bleeds frequently, usually stops on his own, recommends observation and holding Coumadin.  Reports not a candidate for ablation or  embolization.   ? ? ? ? ?Review of Systems: As mentioned in the history of present illness. All other systems reviewed and are negative. ?Past Medical History:  ?Diagnosis Date  ? Antiphospholipid antibody positive   ? Aortic aneurysm ([place])   ? thoracic, Dr Roxan Hockey, with h/o dissection  ? BPH (benign prostatic hyperplasia)   ? Central retinal artery occlusion   ? Chronic anemia   ? Chronic renal insufficiency   ? COPD (chronic obstructive pulmonary disease) ([place])   ? DDD (degenerative disc disease), cervical   ? DJD (degenerative joint disease)   ? DVT (deep venous thrombosis) ([place])   ? Gastric dysmotility   ? GERD (gastroesophageal reflux disease)   ? Gout   ? HTN (hypertension)   ? Hyperlipidemia   ? Hypertensive retinopathy   ? OU  ? Hypothyroid   ? Macular degeneration   ? Myelodysplasia (myelodysplastic syndrome) ([place])   ? Paget's disease of bone   ? Peripheral neuropathy   ? Permanent atrial fibrillation ([place])   ? Recurrent epistaxis 06/26/2020  ? Rheumatoid arthritis ([place])   ? TIA (transient ischemic attack)   ? ?Past Surgical History:  ?Procedure Laterality Date  ? ATRIAL FIBRILLATION ABLATION  09/23/2014  ? Dr Encarnacion Chu in [place]  ? BACK SURGERY    ? lumbar, x 2  ? CAROTID ENDARTERECTOMY Right 1990  ? CAROTID STENT    ? CATARACT EXTRACTION Bilateral 2010  ? [place]  ? EYE SURGERY Bilateral 2010  ? Cat Sx  ? LUNG DECORTICATION    ?  VATS  ? PACEMAKER GENERATOR CHANGE  12/03/2011  ? MDT Adapta [id number] generator change by Dr Encarnacion Chu for symptomatic bradycardia  ? PACEMAKER IMPLANT  2004  ? MDT   ? ?Social History:  reports that he quit smoking about 59 years ago. His smoking use included cigarettes. He has a 24.00 pack-year smoking history. He has never used smokeless tobacco. He reports current alcohol use of about 2.0 standard drinks per week. He reports that he does not use drugs. ? ?Allergies  ?Allergen Reactions  ? Iodinated Contrast Media Rash  ? ? ?Family History  ?Problem Relation Age of Onset  ?  Cirrhosis Mother   ? Stroke Father   ? Lung cancer Sister   ? Lung cancer Brother   ? ? ?Prior to Admission medications   ?Medication Sig Start Date End Date Taking? Authorizing Provider  ?acetaminophen (TYLENOL) 500 MG tablet Take 1,000 mg by mouth daily.    [provider]  ?allopurinol (ZYLOPRIM) 100 MG tablet Take 1 tablet by mouth daily.    [provider]  ?carvedilol (COREG) 6.25 MG tablet Take 6.25 mg by mouth 2 (two) times daily with a meal.    [provider]  ?Cholecalciferol (VITAMIN D3 PO) Take 1 tablet by mouth in the morning and at bedtime.    [provider]  ?dicyclomine (BENTYL) 10 MG capsule Take 30 mg by mouth in the morning and at bedtime.    [provider]  ?digoxin (LANOXIN) 0.125 MG tablet Take 0.125 mg by mouth every Monday, Wednesday, and Friday.    [provider]  ?Ferrous Sulfate (IRON) 325 (65 Fe) MG TABS Take 1 tablet by mouth 2 (two) times daily.    [provider]  ?fluticasone (FLONASE) 50 MCG/ACT nasal spray Administer 2 sprays in the right nostril daily. 06/13/21 06/13/22  [provider]  ?furosemide (LASIX) 40 MG tablet Take 40 mg by mouth daily.    [provider]  ?gabapentin (NEURONTIN) 400 MG capsule Take 400 mg by mouth at bedtime.    [provider]  ?hydrocortisone (ANUSOL-HC) 2.5 % rectal cream Apply 1 application topically 2 (two) times daily as needed for hemorrhoids or anal itching. 04/15/21   [provider]  ?levothyroxine (SYNTHROID) 100 MCG tablet Take 100 mcg by mouth every morning. 06/04/20   [provider]  ?loteprednol (LOTEMAX) 0.5 % ophthalmic suspension  09/18/21   [provider]  ?Misc Natural Products (GLUCOSAMINE CHOND COMPLEX/MSM PO) Take 1 tablet by mouth daily.    [provider]  ?Multiple Vitamins-Minerals (PRESERVISION AREDS PO) Take 1 tablet by mouth 2 (two) times daily.    [provider]  ?oxymetazoline (AFRIN 12  HOUR) 0.05 % nasal spray Place 2 sprays into both nostrils 2 (two) times daily as needed for congestion. 09/16/20   [provider]  ?pantoprazole (PROTONIX) 40 MG tablet Take 40 mg by mouth daily. 06/04/20   [provider]  ?pregabalin (LYRICA) 75 MG capsule Take 75 mg by mouth 2 (two) times daily.    [provider]  ?Ranibizumab (LUCENTIS) 0.3 MG/0.05ML SOLN every 8 (eight) weeks. 07/27/18   [provider]  ?rOPINIRole (REQUIP) 2 MG tablet Take 2 mg by mouth at bedtime. 03/06/20   [provider]  ?rosuvastatin (CRESTOR) 10 MG tablet Take 10 mg by mouth daily.    [provider]  ?saw palmetto 500 MG capsule Take 500 mg by mouth 2 (two) times daily.    [provider]  ?  tamsulosin (FLOMAX) 0.4 MG CAPS capsule Take 0.4 mg by mouth daily.    [provider]  ?vitamin B-12 (CYANOCOBALAMIN) 1000 MCG tablet Take 1 tablet (1,000 mcg total) by mouth daily. 07/09/21   Orson Slick, MD  ?warfarin (COUMADIN) 2 MG tablet Take by mouth daily. 05/13/21   [provider]  ? ? ?Physical Exam: ?Vitals:  ? 01/09/22 1200 01/09/22 1245 01/09/22 1339 01/09/22 1545  ?BP: (!) 145/78 137/67 134/77 (!) 138/95  ?Pulse: 65  71 67  ?Resp: (!) '24 19 19 14  '$ ?Temp:      ?TempSrc:      ?SpO2: 98%  96% 100%  ? ?General:  Appears calm and comfortable and is in NAD; continuously spitting blood clots from posterior drainage ?Eyes:  PERRL, EOMI, normal lids, iris ?ENT:  grossly normal hearing, lips & tongue, mmm ?Neck:  no LAD, masses or thyromegaly ?Cardiovascular:  RRR, no m/r/g. No LE edema.  ?Respiratory:   CTA bilaterally with no wheezes/rales/rhonchi.  Normal respiratory effort. ?Abdomen:  soft, NT, ND ?Skin:  no rash or induration seen on limited exam other than B symmetric stasis dermatitis ?Musculoskeletal:  grossly normal tone BUE/BLE, good ROM, no bony abnormality ?Psychiatric:  grossly normal mood and affect, speech fluent and appropriate,  AOx3 ?Neurologic:  CN 2-12 grossly intact, moves all extremities in coordinated fashion ? ? ?Radiological Exams on Admission: ?Independently reviewed - see discussion in A/P where applicable ? ?No results found. ? ?EKG: not done ? ? ?L

## 2022-01-09 NOTE — Consult Note (Signed)
ENT CONSULT: ? ?Reason for Consult: Epistaxis ? ?Referring Physician: Dr. Langston Masker, ED provider ? ?HPI: ?Steven Williams is an 88 y.o. male with history of atrial fibrillation, anticoagulated on Coumadin who presented to the ED earlier this morning with left-sided epistaxis.  Patient has history of recurrent epistaxis, and is well-known to me.  I recently saw him in the office for evaluation, at that time patient had been stable with no recent significant bleeding.  He had honey colored crusting noted in his left nare consistent with staph, and had been advised to apply mupirocin ointment twice daily for 14 days.  While in the ED, he was treated with Afrin, TXA and silver nitrate with persistent bleeding noted.  A 10 cm Merisel was then placed in the left nare, but patient continued to have bloody drainage and ENT was consulted. ? ?Past Medical History:  ?Diagnosis Date  ? Antiphospholipid antibody positive   ? Aortic aneurysm ([place])   ? thoracic, Dr Roxan Hockey, with h/o dissection  ? BPH (benign prostatic hyperplasia)   ? Central retinal artery occlusion   ? Chronic anemia   ? Chronic renal insufficiency   ? COPD (chronic obstructive pulmonary disease) ([place])   ? DDD (degenerative disc disease), cervical   ? DJD (degenerative joint disease)   ? DVT (deep venous thrombosis) ([place])   ? Gastric dysmotility   ? GERD (gastroesophageal reflux disease)   ? Gout   ? HTN (hypertension)   ? Hyperlipidemia   ? Hypertensive retinopathy   ? OU  ? Hypothyroid   ? Macular degeneration   ? Myelodysplasia (myelodysplastic syndrome) ([place])   ? Paget's disease of bone   ? Peripheral neuropathy   ? Permanent atrial fibrillation ([place])   ? Recurrent epistaxis 06/26/2020  ? Rheumatoid arthritis ([place])   ? TIA (transient ischemic attack)   ? ? ?Past Surgical History:  ?Procedure Laterality Date  ? ATRIAL FIBRILLATION ABLATION  09/23/2014  ? Dr Encarnacion Chu in [place]  ? BACK SURGERY    ? lumbar, x 2  ? CAROTID ENDARTERECTOMY Right 1990  ? CAROTID STENT     ? CATARACT EXTRACTION Bilateral 2010  ? [place]  ? EYE SURGERY Bilateral 2010  ? Cat Sx  ? LUNG DECORTICATION    ? VATS  ? PACEMAKER GENERATOR CHANGE  12/03/2011  ? MDT Adapta ADDR01 generator change by Dr Encarnacion Chu for symptomatic bradycardia  ? PACEMAKER IMPLANT  2004  ? MDT   ? ? ?Family History  ?Problem Relation Age of Onset  ? Cirrhosis Mother   ? Stroke Father   ? Lung cancer Sister   ? Lung cancer Brother   ? ? ?Social History:  reports that he quit smoking about 59 years ago. His smoking use included cigarettes. He has a 24.00 pack-year smoking history. He has never used smokeless tobacco. He reports current alcohol use of about 2.0 standard drinks per week. He reports that he does not use drugs. ? ?Allergies:  ?Allergies  ?Allergen Reactions  ? Iodinated Contrast Media Rash  ? ? ?Medications: I have reviewed the patient's current medications. ? ?Results for orders placed or performed during the hospital encounter of 01/09/22 (from the past 48 hour(s))  ?CBC     Status: Abnormal  ? Collection Time: 01/09/22  8:48 AM  ?Result Value Ref Range  ? WBC 4.9 4.0 - 10.5 K/uL  ? RBC 3.02 (L) 4.22 - 5.81 MIL/uL  ? Hemoglobin 10.3 (L) 13.0 - 17.0 g/dL  ? HCT 31.5 (L) 39.0 -  52.0 %  ? MCV 104.3 (H) 80.0 - 100.0 fL  ? MCH 34.1 (H) 26.0 - 34.0 pg  ? MCHC 32.7 30.0 - 36.0 g/dL  ? RDW 13.5 11.5 - 15.5 %  ? Platelets 87 (L) 150 - 400 K/uL  ?  Comment: Immature Platelet Fraction may be ?clinically indicated, consider ?ordering this additional test ?[id number] ?CONSISTENT WITH PREVIOUS RESULT ?REPEATED TO VERIFY ?  ? nRBC 0.0 0.0 - 0.2 %  ?  Comment: Performed at [facility], [place] [address] [place], [place] [address]  ?Protime-INR     Status: Abnormal  ? Collection Time: 01/09/22  8:48 AM  ?Result Value Ref Range  ? Prothrombin Time 28.3 (H) 11.4 - 15.2 seconds  ? INR 2.7 (H) 0.8 - 1.2  ?  Comment: (NOTE) ?INR goal varies based on device and disease states. ?Performed at [facility], [place] [address]  [place], [place] ?[address] ?  ?Basic metabolic panel     Status: Abnormal  ? Collection Time: 01/09/22  8:48 AM  ?Result Value Ref Range  ? Sodium 143 135 - 145 mmol/L  ? Potassium 3.7 3.5 - 5.1 mmol/L  ? Chloride 106 98 - 111 mmol/L  ? CO2 26 22 - 32 mmol/L  ? Glucose, Bld 117 (H) 70 - 99 mg/dL  ?  Comment: Glucose reference range applies only to samples taken after fasting for at least 8 hours.  ? BUN 54 (H) 8 - 23 mg/dL  ? Creatinine, Ser 2.57 (H) 0.61 - 1.24 mg/dL  ? Calcium 11.0 (H) 8.9 - 10.3 mg/dL  ? GFR, Estimated 23 (L) >60 mL/min  ?  Comment: (NOTE) ?Calculated using the CKD-EPI Creatinine Equation (2021) ?  ? Anion gap 11 5 - 15  ?  Comment: Performed at [facility], [place] [address], [place], [place] [address]  ? ? ?No results found. ? ?ROS:ROS ? ?Blood pressure 134/66, pulse 68, temperature 97.6 ?F (36.4 ?C), temperature source Oral, resp. rate 19, SpO2 97 %. ? ?PHYSICAL EXAM: ?CONSTITUTIONAL: well developed, nourished, no distress and alert and oriented x 3 ?PULMONARY/CHEST WALL: effort normal and no stridor, no stertor, no dysphonia ?HENT: Head : normocephalic and atraumatic ?Ears: Right ear:   canal normal, external ear normal and hearing normal ?Left ear:   canal normal, external ear normal and hearing normal ?Nose: Merisel pack in left nare noted, saturated, with intermittent bloody drainage noted anteriorly.  Following removal of Merisel, left nasal cavity was copiously irrigated and suctioned.  Some sloughing of anterior septal mucosa noted corresponding to previous cautery site.  Small amount of active bleeding noted from cautery site.  No evidence of bleeding posteriorly ?Mouth/Throat:  Mouth: uvula midline and no oral lesions ?Throat: oropharynx clear and moist ?Mucous membranes: normal ?EYES: conjunctiva normal, EOM normal and PERRL ?NECK: supple, trachea normal and no thyromegaly or cervical LAD ? ?Studies Reviewed: None ? ?Procedure: Control of Epistaxis ?Following nasal packing removal,  the left nasal cavity was copiously irrigated with saline and Afrin. Topical lidocaine was placed on a cotton ball, and following removal, only a small amount of bleeding was noted from the previous cautery sites.  Nasal cavity was suctioned and Floseal was placed along bleeding sites, and used to fill the left nasal cavity.  Posisep dissolvable packing was then placed in the left nasal cavity and hydrated using Afrin.  Following this, no further bleeding was appreciated. ? ? ?Assessment/Plan: ?Steven Williams is a 88 y/o M with  history of atrial fibrillation, anticoagulated on Coumadin with history of recurrent epistaxis.  Patient failed interventions performed by ED provider to include Afrin, TXA, silver nitrate and 10 cm Merisel.  Nasal packing was removed and following removal, only a small amount of active bleeding was noted from previous cautery sites along left caudal septum.  Floseal dissolvable packing was placed in the left nasal cavity, as well as Posisep (also dissolvable), with achievement of hemostasis. ?-Patient to be admitted to hospital medicine for overnight observation. ?-Recommend keeping head of bed elevated ?-Counseled patient to avoid nasal manipulation.  Drip pad placed, can change as needed.  ?-To begin use of nasal saline tomorrow, recommend 2-4 sprays to bilateral naris every 4-6 hours ?-Patient can follow-up with me next week for recheck ?-If bleeding resumes, place Afrin soaked cotton ball in left nare, and apply pressure to nares for 15 min and notify ENT ? ?Thank you for allowing me to participate in the care of this patient. Please do not hesitate to contact me with any questions or concerns.  ? ?[place], DO ?Otolaryngology ?[facility] ?Cell: [phone] ? ? ?01/09/2022, 6:00 PM  ? ? ? ?

## 2022-01-09 NOTE — Discharge Instructions (Addendum)
Please call your ENT doctor's office to arrange for follow-up on Monday or Tuesday to have your packing removed in your nose looked at again.  The packing should not stay in for longer than 3-5 days MAXIMUM.  It should ideally be removed Monday morning.  If this cannot be done at a specialist office, you can also call your general practitioner. ?

## 2022-01-09 NOTE — ED Notes (Signed)
No bleeding from the lt nostril at present  he still has some blood goin down his thoat ?

## 2022-01-09 NOTE — ED Provider Notes (Signed)
?[facility] ?Provider Note ? ? ?CSN: [id number] ?Arrival date & time: 01/09/22  0805 ? ?  ? ?History ? ?Chief Complaint  ?Patient presents with  ?? Epistaxis  ? ? ?Steven Williams is a 88 y.o. male with a history of A-fib on Coumadin, history of recurring nosebleeds, presented ED with another nosebleed.  He says it began around 4 AM this morning.  EMS applied Afrin in route to the hospital.  Patient feels bleeding is stopped since arrival.  He has had there been no changes in his Coumadin dosing. ? ?Per review of external records he has been seen in the ER as well as ENT at [facility] several times for these recurring nosebleeds.  He was advised to apply mupirocin ointment and use Afrin as needed at home. ? ?HPI ? ?  ? ?Home Medications ?Prior to Admission medications   ?Medication Sig Start Date End Date Taking? Authorizing Provider  ?acetaminophen (TYLENOL) 500 MG tablet Take 1,000 mg by mouth daily.    [provider]  ?allopurinol (ZYLOPRIM) 100 MG tablet Take 1 tablet by mouth daily.    [provider]  ?carvedilol (COREG) 6.25 MG tablet Take 6.25 mg by mouth 2 (two) times daily with a meal.    [provider]  ?Cholecalciferol (VITAMIN D3 PO) Take 1 tablet by mouth in the morning and at bedtime.    [provider]  ?dicyclomine (BENTYL) 10 MG capsule Take 30 mg by mouth in the morning and at bedtime.    [provider]  ?digoxin (LANOXIN) 0.125 MG tablet Take 0.125 mg by mouth every Monday, Wednesday, and Friday.    [provider]  ?Ferrous Sulfate (IRON) 325 (65 Fe) MG TABS Take 1 tablet by mouth 2 (two) times daily.    [provider]  ?fluticasone (FLONASE) 50 MCG/ACT nasal spray Administer 2 sprays in the right nostril daily. 06/13/21 06/13/22  [provider]  ?furosemide (LASIX) 40 MG tablet Take 40 mg by mouth daily.    [provider]  ?gabapentin (NEURONTIN) 400 MG capsule Take 400 mg by  mouth at bedtime.    [provider]  ?hydrocortisone (ANUSOL-HC) 2.5 % rectal cream Apply 1 application topically 2 (two) times daily as needed for hemorrhoids or anal itching. 04/15/21   [provider]  ?levothyroxine (SYNTHROID) 100 MCG tablet Take 100 mcg by mouth every morning. 06/04/20   [provider]  ?loteprednol (LOTEMAX) 0.5 % ophthalmic suspension  09/18/21   [provider]  ?Misc Natural Products (GLUCOSAMINE CHOND COMPLEX/MSM PO) Take 1 tablet by mouth daily.    [provider]  ?Multiple Vitamins-Minerals (PRESERVISION AREDS PO) Take 1 tablet by mouth 2 (two) times daily.    [provider]  ?oxymetazoline (AFRIN 12 HOUR) 0.05 % nasal spray Place 2 sprays into both nostrils 2 (two) times daily as needed for congestion. 09/16/20   [provider]  ?pantoprazole (PROTONIX) 40 MG tablet Take 40 mg by mouth daily. 06/04/20   [provider]  ?pregabalin (LYRICA) 75 MG capsule Take 75 mg by mouth 2 (two) times daily.    [provider]  ?Ranibizumab (LUCENTIS) 0.3 MG/0.05ML SOLN every 8 (eight) weeks. 07/27/18   [provider]  ?rOPINIRole (REQUIP) 2 MG tablet Take 2 mg by mouth at bedtime. 03/06/20   [provider]  ?rosuvastatin (CRESTOR) 10 MG tablet Take 10 mg by mouth daily.    [provider]  ?saw palmetto 500  MG capsule Take 500 mg by mouth 2 (two) times daily.    [provider]  ?tamsulosin (FLOMAX) 0.4 MG CAPS capsule Take 0.4 mg by mouth daily.    [provider]  ?vitamin B-12 (CYANOCOBALAMIN) 1000 MCG tablet Take 1 tablet (1,000 mcg total) by mouth daily. 07/09/21   Orson Slick, MD  ?warfarin (COUMADIN) 2 MG tablet Take by mouth daily. 05/13/21   [provider]  ?   ? ?Allergies    ?Iodinated contrast media   ? ?Review of Systems   ?Review of Systems ? ?Physical Exam ?Updated Vital Signs ?BP 126/74   Pulse 63   Temp 97.6 ?F (36.4 ?C) (Oral)   Resp (!) 21    SpO2 98%  ?Physical Exam ?Constitutional:   ?   General: He is not in acute distress. ?HENT:  ?   Head: Normocephalic and atraumatic.  ?   Nose:  ?   Right Nostril: No epistaxis, septal hematoma or occlusion.  ?   Left Nostril: No septal hematoma or occlusion.  ?   Comments: No active bleeding or visible bleeding source in the left naris. ?Eyes:  ?   Conjunctiva/sclera: Conjunctivae normal.  ?   Pupils: Pupils are equal, round, and reactive to light.  ?Cardiovascular:  ?   Rate and Rhythm: Normal rate. Rhythm irregular.  ?Pulmonary:  ?   Effort: Pulmonary effort is normal. No respiratory distress.  ?Skin: ?   General: Skin is warm and dry.  ?Neurological:  ?   General: No focal deficit present.  ?   Mental Status: He is alert. Mental status is at baseline.  ?Psychiatric:     ?   Mood and Affect: Mood normal.     ?   Behavior: Behavior normal.  ? ? ?ED Results / Procedures / Treatments   ?Labs ?(all labs ordered are listed, but only abnormal results are displayed) ?Labs Reviewed  ?CBC - Abnormal; Notable for the following components:  ?    Result Value  ? RBC 3.02 (*)   ? Hemoglobin 10.3 (*)   ? HCT 31.5 (*)   ? MCV 104.3 (*)   ? MCH 34.1 (*)   ? Platelets 87 (*)   ? All other components within normal limits  ?PROTIME-INR - Abnormal; Notable for the following components:  ? Prothrombin Time 28.3 (*)   ? INR 2.7 (*)   ? All other components within normal limits  ?BASIC METABOLIC PANEL - Abnormal; Notable for the following components:  ? Glucose, Bld 117 (*)   ? BUN 54 (*)   ? Creatinine, Ser 2.57 (*)   ? Calcium 11.0 (*)   ? GFR, Estimated 23 (*)   ? All other components within normal limits  ? ? ?EKG ?None ? ?Radiology ?No results found. ? ?Procedures ?.Epistaxis Management ? ?Date/Time: 01/09/2022 10:39 AM ?Performed by: Wyvonnia Dusky, MD ?Authorized by: Wyvonnia Dusky, MD  ? ?Consent:  ?  Consent obtained:  Verbal ?  Consent given by:  Patient ?  Risks, benefits, and alternatives were discussed: yes   ?   Risks discussed:  Bleeding, infection, nasal injury and pain ?  Alternatives discussed:  No treatment and observation ?Universal protocol:  ?  Procedure explained and questions answered to patient or proxy's satisfaction: yes   ?  Immediately prior to procedure, a time out was called: yes   ?  Patient identity confirmed:  Arm band ?Anesthesia:  ?  Anesthesia method:  None ?Procedure details:  ?  Treatment site:  L septum ?  Treatment method:  Silver nitrate ?  Treatment complexity:  Limited ?  Treatment episode: initial   ?Post-procedure details:  ?  Assessment:  No improvement ?  Procedure completion:  Tolerated well, no immediate complications  ? ? ?Medications Ordered in ED ?Medications - No data to display ? ?ED Course/ Medical Decision Making/ A&P ?Clinical Course as of 01/09/22 1352  ?Fri Jan 09, 2022  ?0920 Hemoglobin is at baseline.  INR is therapeutic. [MT]  ?0933 Cr at baseline with chronic CKD [MT]  ?1038 Patient reassessed and remained stable.  I did locate a small punctate source of bleed in the left naris, discussed with the patient had cauterization at this time, which she would prefer to have done, and this was done with silver nitrate.  He is stable for discharge and will call a ride. [MT]  ?1219 On reassessment after clearing the blood clots were doing Phexxi another punctate anterior bleed, discussed with patient at this point attempted recauterization, with the risks of pain and further bleeding, versus packing.  He would strongly prefer to avoid packing and did want a temporary cauterization, which was done.  We are now observing him to see if he continues bleeding [MT]  ?17 Dr Janace Hoard ENT recommending medical observation admission, hold coumadin dose, allow merocele a chance to tamponade.   [MT]  ?0881 I spoke to Dr Barbra Sarks ENT who is the patient's ENT doctor, she recommends admitting of observation and holding his Coumadin dose, he has a very poor candidate for any kind of  surgical intervention.  Likely the bleeding will stop on its own overnight.  Patient is agreeable to admission [MT]  ?  ?Clinical Course User Index ?[MT] Wyvonnia Dusky, MD  ? ?                        ?Medical

## 2022-01-09 NOTE — ED Notes (Signed)
I called  47mto get purple man placed ?

## 2022-01-09 NOTE — Progress Notes (Signed)
?   01/09/22 2258  ?Level of Consciousness  ?Level of Consciousness Alert  ?MEWS COLOR  ?MEWS Score Color Green  ?Oxygen Therapy  ?SpO2 98 %  ?O2 Device Room Air  ?Pain Assessment  ?Pain Scale 0-10  ?Pain Score 0  ?Complaints & Interventions  ?Complains of Coughing;Nausea /  Vomiting ?(Coughing up and gagging on clots)  ?Interventions Other (comment) ?(Nose currently packed with Afrin soaked cotton balls)  ?Nausea relieved by Nothing  ?Patients response to intervention Unchanged  ?MEWS Score  ?MEWS Temp 0  ?MEWS Systolic 0  ?MEWS Pulse 0  ?MEWS RR 0  ?MEWS LOC 0  ?MEWS Score 0  ?Provider Notification  ?Provider Name/Title Marlyce Huge, MD and Fredric Dine, MD  ?Date Provider Notified 01/09/22  ?Time Provider Notified 2301  ?Method of Notification Page ?(Secure chat)  ?Notification Reason Requested by patient/family  ? ? ?

## 2022-01-09 NOTE — ED Triage Notes (Addendum)
Pt BIB EMS due to epistaxis that started at 3am. Pt is from [facility]. Pt is axox4. VSS. Pt takes warfarin for afib. Pt received afrin via EMS.  ?

## 2022-01-09 NOTE — ED Notes (Signed)
Dr stotnicki notified that the pts nose was bleeding again  her instructions were to use a cotton ball soaked in afrin  and hold pressure for 15 minutes ?

## 2022-01-09 NOTE — ED Provider Notes (Addendum)
Following discharge, patient began complaining that he was still "spitting up blood".  I examined him again I do not see any anterior bleeding in the left or right nares.  This possibly is a posterior bleed or else is having residual clearing of clots that he swallowed already.  We have sprayed additional Afrin in the left nostril, now having him upright and bent forward with pressure on the nose for 10 minutes.  I will reassess him. ? ?Update, after second attempt at cautery, the patient continued having bleeding, and at this point proceeded with packing. 10 merocele was used with both lidocaine numbing spray and Uro-Jet lidocaine jelly.  Unfortunately, although we initially thought we achieved hemostasis, at the time of discharge the patient again began bleeding around his packing site.  He continues to have oozing around the [place].  I subsequently spoke with the ENT specialist by phone, see ED course, we have agreed for medical observation admission, holding next Coumadin dose, trending of hemoglobin levels.  The ENT doctor is optimistic that the bleeding would likely stop with some time today.  The patient is not a candidate for operative intervention at this time. ? ?Hospitalist has been paged for admission. ? ?Patient is updated and agreeable to staying in the hospital ? ?Clinical Course as of 01/09/22 1418  ?Fri Jan 09, 2022  ?0920 Hemoglobin is at baseline.  INR is therapeutic. [MT]  ?0933 Cr at baseline with chronic CKD [MT]  ?1038 Patient reassessed and remained stable.  I did locate a small punctate source of bleed in the left naris, discussed with the patient had cauterization at this time, which she would prefer to have done, and this was done with silver nitrate.  He is stable for discharge and will call a ride. [MT]  ?1219 On reassessment after clearing the blood clots were doing Phexxi another punctate anterior bleed, discussed with patient at this point attempted recauterization, with the risks of  pain and further bleeding, versus packing.  He would strongly prefer to avoid packing and did want a temporary cauterization, which was done.  We are now observing him to see if he continues bleeding [MT]  ?43 Dr Janace Hoard ENT recommending medical observation admission, hold coumadin dose, allow merocele a chance to tamponade.   [MT]  ?1017 I spoke to Dr Barbra Sarks ENT who is the patient's ENT doctor, she recommends admitting of observation and holding his Coumadin dose, he has a very poor candidate for any kind of surgical intervention.  Likely the bleeding will stop on its own overnight.  Patient is agreeable to admission [MT]  ?  ?Clinical Course User Index ?[MT] Wyvonnia Dusky, MD  ? ? ?.Epistaxis Management ? ?Date/Time: 01/09/2022 1:23 PM ?Performed by: Wyvonnia Dusky, MD ?Authorized by: Wyvonnia Dusky, MD  ? ?Consent:  ?  Consent obtained:  Verbal ?  Risks discussed:  Bleeding and nasal injury ?  Alternatives discussed:  Delayed treatment and observation ?Universal protocol:  ?  Procedure explained and questions answered to patient or proxy's satisfaction: yes   ?  Immediately prior to procedure, a time out was called: yes   ?  Patient identity confirmed:  Arm band ?Anesthesia:  ?  Anesthesia method:  Topical application ?  Topical anesthetic:  LET ?Procedure details:  ?  Treatment site:  L posterior ?  Treatment method:  Merocel sponge ?  Treatment complexity:  Extensive ?  Treatment episode: recurring   ?Post-procedure details:  ?  Assessment:  Bleeding stopped ?  Procedure completion:  Tolerated well, no immediate complications ? ?  ?Wyvonnia Dusky, MD ?01/09/22 1323 ? ?  ?Wyvonnia Dusky, MD ?01/09/22 1418 ? ?

## 2022-01-09 NOTE — ED Provider Notes (Signed)
Per my discussion with Dr Fredric Dine, we can try nebulized racemic epi and/or TXA, which has been ordered for nebulizer solution. ?  ?Steven Dusky, MD ?01/09/22 1436 ? ?

## 2022-01-09 NOTE — Progress Notes (Signed)
Report received from [facility], [place] in ED. Pt's nose was not packed but is not currently bleeding. May use affrin soaked cotton balls prn bleeding per MD. Pt to be admitted to med surg bed. ?

## 2022-01-10 DIAGNOSIS — M5030 Other cervical disc degeneration, unspecified cervical region: Secondary | ICD-10-CM | POA: Diagnosis present

## 2022-01-10 DIAGNOSIS — N400 Enlarged prostate without lower urinary tract symptoms: Secondary | ICD-10-CM | POA: Diagnosis present

## 2022-01-10 DIAGNOSIS — R040 Epistaxis: Secondary | ICD-10-CM | POA: Diagnosis present

## 2022-01-10 DIAGNOSIS — I4821 Permanent atrial fibrillation: Secondary | ICD-10-CM | POA: Diagnosis present

## 2022-01-10 DIAGNOSIS — K219 Gastro-esophageal reflux disease without esophagitis: Secondary | ICD-10-CM | POA: Diagnosis present

## 2022-01-10 DIAGNOSIS — G629 Polyneuropathy, unspecified: Secondary | ICD-10-CM | POA: Diagnosis present

## 2022-01-10 DIAGNOSIS — J449 Chronic obstructive pulmonary disease, unspecified: Secondary | ICD-10-CM | POA: Diagnosis present

## 2022-01-10 DIAGNOSIS — E039 Hypothyroidism, unspecified: Secondary | ICD-10-CM | POA: Diagnosis present

## 2022-01-10 DIAGNOSIS — M069 Rheumatoid arthritis, unspecified: Secondary | ICD-10-CM | POA: Diagnosis present

## 2022-01-10 DIAGNOSIS — Z823 Family history of stroke: Secondary | ICD-10-CM | POA: Diagnosis not present

## 2022-01-10 DIAGNOSIS — Z79890 Hormone replacement therapy (postmenopausal): Secondary | ICD-10-CM | POA: Diagnosis not present

## 2022-01-10 DIAGNOSIS — I714 Abdominal aortic aneurysm, without rupture, unspecified: Secondary | ICD-10-CM | POA: Diagnosis present

## 2022-01-10 DIAGNOSIS — Z79899 Other long term (current) drug therapy: Secondary | ICD-10-CM | POA: Diagnosis not present

## 2022-01-10 DIAGNOSIS — M109 Gout, unspecified: Secondary | ICD-10-CM | POA: Diagnosis present

## 2022-01-10 DIAGNOSIS — Z87891 Personal history of nicotine dependence: Secondary | ICD-10-CM | POA: Diagnosis not present

## 2022-01-10 DIAGNOSIS — D469 Myelodysplastic syndrome, unspecified: Secondary | ICD-10-CM | POA: Diagnosis present

## 2022-01-10 DIAGNOSIS — I129 Hypertensive chronic kidney disease with stage 1 through stage 4 chronic kidney disease, or unspecified chronic kidney disease: Secondary | ICD-10-CM | POA: Diagnosis present

## 2022-01-10 DIAGNOSIS — Z7901 Long term (current) use of anticoagulants: Secondary | ICD-10-CM | POA: Diagnosis not present

## 2022-01-10 DIAGNOSIS — Z801 Family history of malignant neoplasm of trachea, bronchus and lung: Secondary | ICD-10-CM | POA: Diagnosis not present

## 2022-01-10 DIAGNOSIS — G2581 Restless legs syndrome: Secondary | ICD-10-CM | POA: Diagnosis present

## 2022-01-10 DIAGNOSIS — E785 Hyperlipidemia, unspecified: Secondary | ICD-10-CM | POA: Diagnosis present

## 2022-01-10 DIAGNOSIS — Z950 Presence of cardiac pacemaker: Secondary | ICD-10-CM | POA: Diagnosis not present

## 2022-01-10 DIAGNOSIS — N184 Chronic kidney disease, stage 4 (severe): Secondary | ICD-10-CM | POA: Diagnosis present

## 2022-01-10 DIAGNOSIS — Z66 Do not resuscitate: Secondary | ICD-10-CM | POA: Diagnosis present

## 2022-01-10 LAB — CBC
HCT: 28.3 % — ABNORMAL LOW (ref 39.0–52.0)
HCT: 32.3 % — ABNORMAL LOW (ref 39.0–52.0)
HCT: 33.9 % — ABNORMAL LOW (ref 39.0–52.0)
Hemoglobin: 10.9 g/dL — ABNORMAL LOW (ref 13.0–17.0)
Hemoglobin: 11 g/dL — ABNORMAL LOW (ref 13.0–17.0)
Hemoglobin: 9.5 g/dL — ABNORMAL LOW (ref 13.0–17.0)
MCH: 33.7 pg (ref 26.0–34.0)
MCH: 34.4 pg — ABNORMAL HIGH (ref 26.0–34.0)
MCH: 34.7 pg — ABNORMAL HIGH (ref 26.0–34.0)
MCHC: 32.4 g/dL (ref 30.0–36.0)
MCHC: 33.6 g/dL (ref 30.0–36.0)
MCHC: 33.7 g/dL (ref 30.0–36.0)
MCV: 102.5 fL — ABNORMAL HIGH (ref 80.0–100.0)
MCV: 102.9 fL — ABNORMAL HIGH (ref 80.0–100.0)
MCV: 104 fL — ABNORMAL HIGH (ref 80.0–100.0)
Platelets: 79 10*3/uL — ABNORMAL LOW (ref 150–400)
Platelets: 85 10*3/uL — ABNORMAL LOW (ref 150–400)
Platelets: 92 10*3/uL — ABNORMAL LOW (ref 150–400)
RBC: 2.76 MIL/uL — ABNORMAL LOW (ref 4.22–5.81)
RBC: 3.14 MIL/uL — ABNORMAL LOW (ref 4.22–5.81)
RBC: 3.26 MIL/uL — ABNORMAL LOW (ref 4.22–5.81)
RDW: 13.4 % (ref 11.5–15.5)
RDW: 13.6 % (ref 11.5–15.5)
RDW: 13.7 % (ref 11.5–15.5)
WBC: 5.4 10*3/uL (ref 4.0–10.5)
WBC: 5.9 10*3/uL (ref 4.0–10.5)
WBC: 6 10*3/uL (ref 4.0–10.5)
nRBC: 0 % (ref 0.0–0.2)
nRBC: 0 % (ref 0.0–0.2)
nRBC: 0 % (ref 0.0–0.2)

## 2022-01-10 LAB — BASIC METABOLIC PANEL
Anion gap: 10 (ref 5–15)
BUN: 49 mg/dL — ABNORMAL HIGH (ref 8–23)
CO2: 24 mmol/L (ref 22–32)
Calcium: 10.4 mg/dL — ABNORMAL HIGH (ref 8.9–10.3)
Chloride: 108 mmol/L (ref 98–111)
Creatinine, Ser: 2.25 mg/dL — ABNORMAL HIGH (ref 0.61–1.24)
GFR, Estimated: 27 mL/min — ABNORMAL LOW (ref >60)
Glucose, Bld: 102 mg/dL — ABNORMAL HIGH (ref 70–99)
Potassium: 3.6 mmol/L (ref 3.5–5.1)
Sodium: 142 mmol/L (ref 135–145)

## 2022-01-10 LAB — APTT: aPTT: 39 seconds — ABNORMAL HIGH (ref 24–36)

## 2022-01-10 LAB — PROTIME-INR
INR: 2.2 — ABNORMAL HIGH (ref 0.8–1.2)
Prothrombin Time: 24.3 seconds — ABNORMAL HIGH (ref 11.4–15.2)

## 2022-01-10 MED ORDER — SALINE SPRAY 0.65 % NA SOLN
2.0000 | NASAL | Status: DC
  Administered 2022-01-10 – 2022-01-11 (×5): 2 via NASAL
  Filled 2022-01-10: qty 44

## 2022-01-10 MED ORDER — PHYTONADIONE 5 MG PO TABS
2.5000 mg | ORAL_TABLET | Freq: Once | ORAL | Status: AC
  Administered 2022-01-10: 2.5 mg via ORAL
  Filled 2022-01-10: qty 1

## 2022-01-10 MED ORDER — LIDOCAINE HCL URETHRAL/MUCOSAL 2 % EX GEL
1.0000 "application " | Freq: Once | CUTANEOUS | Status: DC
  Filled 2022-01-10: qty 6

## 2022-01-10 MED ORDER — VITAMIN K1 10 MG/ML IJ SOLN
5.0000 mg | Freq: Once | INTRAVENOUS | Status: AC
  Administered 2022-01-10: 5 mg via INTRAVENOUS
  Filled 2022-01-10: qty 0.5

## 2022-01-10 NOTE — Progress Notes (Signed)
ENT PROGRESS NOTE ? ? ?Subjective: ?Patient seen and examined at bedside. Patient denies any nasal bleeding since last night. He reports body aches but states he otherwise feels okay.  ? ?Objective: ?Vital signs in last 24 hours: ?Temp:  [97.5 ?F (36.4 ?C)-98.3 ?F (36.8 ?C)] 98.3 ?F (36.8 ?C) (05/06 1009) ?Pulse Rate:  [57-95] 60 (05/06 1009) ?Resp:  [14-24] 17 (05/06 1009) ?BP: (105-169)/(42-95) 112/49 (05/06 1009) ?SpO2:  [93 %-100 %] 93 % (05/06 1009) ?Weight:  [81.7 kg] 81.7 kg (05/05 2207) ? ?PHYSICAL EXAM: ?CONSTITUTIONAL: well developed, nourished, no distress and alert and oriented x 3 ?PULMONARY/CHEST WALL: effort normal and no stridor, no stertor, no dysphonia ?HENT: Head : normocephalic and atraumatic ?Ears: Right ear:   canal normal, external ear normal and hearing normal ?Left ear:   canal normal, external ear normal and hearing normal ?Nose: Dissolvable nasal packing noted in left nare with no evidence of active bleeding. Right nare normal, no blood or blood clots noted. ?Mouth/Throat:  Mouth: uvula midline and no oral lesions ?Throat: oropharynx clear and moist. Old clot suctioned from oropharynx, no active bleeding noted.  ?Mucous membranes: normal ?EYES: conjunctiva normal, EOM normal and PERRL ?NECK: supple, trachea normal and no thyromegaly or cervical LAD ?  ?Lab Results  ?Component Value Date  ? WBC 5.4 01/10/2022  ? HGB 10.9 (L) 01/10/2022  ? HCT 32.3 (L) 01/10/2022  ? MCV 102.9 (H) 01/10/2022  ? PLT 85 (L) 01/10/2022  ? ? ? ?Recent Labs  ?  01/09/22 ?5035 01/10/22 ?4656  ?NA 143 142  ?K 3.7 3.6  ?CL 106 108  ?CO2 26 24  ?GLUCOSE 117* 102*  ?BUN 54* 49*  ?CREATININE 2.57* 2.25*  ?CALCIUM 11.0* 10.4*  ? ? ?Medications: I have reviewed the patient's current medications. ? ?New Imaging: None ? ?Pathology: NA ? ?Assessment/Plan: ?Steven Williams is a 88 y/o M with history of atrial fibrillation, anticoagulated on Coumadin with history of recurrent epistaxis.  Patient failed interventions performed by  ED provider to include Afrin, TXA, silver nitrate and 10 cm Merisel.  Nasal packing was removed and following removal, only a small amount of active bleeding was noted from previous cautery sites along left caudal septum.  Floseal dissolvable packing was placed in the left nasal cavity, as well as Posisep (also dissolvable), with achievement of hemostasis. Patient subsequently had onset of bleeding after getting up and going to bathroom, and then was controlled with TXA, Afrin, and ultimately with administration of Vitamin K. ?-On exam, patient has no evidence of active bleeding. I discussed placing additional packing in left nare, but as patient is hemostatic currently, will hold off ?-To begin use of nasal saline today, recommend 2-4 sprays to bilateral naris every 4-6 hours ?-MAINTAIN NASAL DRIP PAD IN PLACE TO DISCOURAGE NASAL MANIPULATION. Discussed with nursing; blood tinged drainage to be expected after starting nasal saline, but notify ENT with frank bloody drainage. ?-OK to take off bedrest ?-OK to begin CLD ?-Medical management per primary ?-Please keep ENT cart at bedside until discharge ?-If bleeding resumes, place Afrin soaked cotton ball in left nare, and apply clamp/pressure to nares for 20 min and notify ENT ? ? LOS: 0 days  ? ?Thank you for allowing me to participate in the care of this patient. Please do not hesitate to contact me with any questions or concerns.  ? ?Narka, DO ?Otolaryngology ?[facility] ?Cell: [phone] ? ?01/10/2022, 11:49 AM ? ? ? ? ? ?

## 2022-01-10 NOTE — Progress Notes (Signed)
NEW ADMISSION NOTE ?New Admission Note:  ? ?Arrival Method: Stretcher via ED ?Mental Orientation: Alert and Oriented x4 ?Telemetry: Tele Box 7 ?Assessment: Completed. Active nose bleed upon arrival on the unit.  ?Skin:  Intact. Bilateral discoloration on lower extremities.  ?IV: Left Upper arm ?Pain: Patient states no pain. ?Tubes: None ?Safety Measures: Safety Fall Prevention Plan has been given, discussed and signed ?Admission: Initiated  ?[facility] Orientation: Patient has been orientated to the room, unit and staff.  ?Family: Not present at the bedside. ? ?Orders have been reviewed and implemented. Will continue to monitor the patient. Call light has been placed within reach and bed alarm has been activated.  ? ?Sharmon Revere, RN   ?

## 2022-01-10 NOTE — Progress Notes (Signed)
HOSPITAL MEDICINE OVERNIGHT EVENT NOTE   ? ?Called to the patient bedside due to recurrent epistaxis with patient frequently coughing up clots. ? ?Chart reviewed, Patient has a notable history of atrial fibrillation and is on Coumadin.  Patient has a known history of recurrent epistaxis and husband hospitalized with recurrence from the left nostril.  In the emergency department patient has been treated with Afrin as well as direct application of TXA and insertion of 10 cm Merocel nasal packing.  Dr. Fredric Dine with ENT was consulted who applied Floseal dissolvable packing in the left nasal cavity as well as Posisep achieving hemostasis.  Patient began to exhibit recurrent bleeding later that afternoon which was addressed by ED nursing with reported attempts to place to Afrin-soaked cotton balls in the left nare.  This was unsuccessful and the patient continued to exhibit bleeding.  Several hours later patient was sent to the medical floor at approximately 10 PM. ? ?Closer to midnight I was contacted with reports the patient was continuing to bleed as patient began to cough up large blood clots.  I promptly went to go evaluate the patient at the bedside.  Patient appeared to be hemodynamically stable but continued to exhibit ongoing bleeding causing a bit of agitation and distress with this patient.  I was informed that the patient had a 2 TXA soaked cottonballs in the left nare but I was only able to identify 1.  This was removed.  I then helped to remove several large blood clots from the patient's posterior oropharynx.  I directly visualized the left nare revealing a dressing that continued to be deep within the nasal passage.  I presume that this is likely the Posisep dressing applied by ENT the afternoon before but it could also a retained cotton ball placed by nursing.  I therefore did not place any additional nasal packing, administered 2.5 mg of oral vitamin K and provided two generous sprays of Afrin nasal  spray in each nostril.  I additionally provided the patient with a Yankauer and advised the patient to gently suction if any bleeding recurred.  ? ?Patient now seems to be clinically improved shortly after these measures were taken.  Monitoring patient closely with continuous pulse oximetry and telemetry for now.  Nursing to notify me if patient worsens. ? ?Steven Emerald  MD ?[facility]  ? ?ADDENDUM (5/6 5:30AM) ? ?While patient has experienced a substantial reduction in bleeding since administration of vitamin K he is still continuing to experience some degree of slow bleeding into the posterior oropharynx.  We will go ahead and order an additional 5 mg of intravenous vitamin K.  If patient is continuing to bleed later this morning ENT can be notified at the start of day shift. ? ?Steven Williams Darius Lundberg ? ? ? ? ? ? ? ? ? ? ? ? ?

## 2022-01-10 NOTE — Progress Notes (Signed)
?PROGRESS NOTE ? ? ? ?Steven Williams  VZS:[id number] DOB: [birth year] DOA: 01/09/2022 ?PCP: Ginger Organ., MD ? ? ?Brief Narrative:  ?HPI: Steven Williams is a 88 y.o. male with medical history significant of AAA; BPH; COPD; HTN; HLD; pacemaker placement; hypothyroidism; MDS; afib on Coumadin; recurrent epistaxis; and RA presenting with epistaxis.  He was last seen by ENT (Dr. Fredric Dine) for this issue and seemed to be improved at that time.  He reports that he started with nosebleeds when he moved to [place] 3 years ago.  He was having recurrent bleeds and then tried vaseline which worked for a period of time.  He had another recent nosebleed and then started with this one last night and it just won't stop bleeding.  Packing was placed in the ER and he is now experiencing posterior bleeding and spitting clots.  It is very uncomfortable.   ?  ?His daughter reports that he was in the hospital 4 times last summer because the epistaxis would not stop.  The Coumadin has been a better option until recently - ?allergies.   ?  ?The patient and I discussed possible options including embolization and Coumadin reversal - both short-term and long-term. ?I called and left a message for Dr. Fredric Dine; Dr. Langston Masker already spoke with her. ?  ?If also spoke with neurointerventional radiology - They only embolize if packing fails.  ENT usually tries packing before calling IR.  Dr. Kathee Delton is on call this weekend for embolization, if needed.   ?  ?  ?  ?ER Course:  Afib with recurrent nosebleeds, here with persistent nosebleed.  Has had pressure, cauterization, Merocele.  Has improved but still oozing.  INR 2.7.  Talked with ENT - he bleeds frequently, usually stops on his own, recommends observation and holding Coumadin.  Reports not a candidate for ablation or embolization.   ? ?Assessment & Plan: ?  ?Principal Problem: ?  Recurrent epistaxis ?Active Problems: ?  Benign prostatic hyperplasia without lower urinary tract symptoms ?  CKD  (chronic kidney disease) stage 4, GFR 15-29 ml/min (HCC) ?  Essential hypertension ?  Hyperlipidemia ?  Hypothyroidism ?  Paroxysmal atrial fibrillation (HCC) ?  Polyneuropathy ?  Restless legs syndrome ?  DNR (do not resuscitate) ? ?Recurrent epistaxis:  Patient has a known history of recurrent epistaxis and husband hospitalized with recurrence from the left nostril.  In the emergency department patient has been treated with Afrin as well as direct application of TXA and insertion of 10 cm Merocel nasal packing.  Dr. Fredric Dine with ENT was consulted who applied Floseal dissolvable packing in the left nasal cavity as well as Posisep achieving hemostasis.  Patient began to exhibit recurrent bleeding later that afternoon which was addressed by ED nursing with reported attempts to place to Afrin-soaked cotton balls in the left nare.  This was unsuccessful and the patient continued to exhibit bleeding.  He then had bleeding on the floor and he was attended by the night hospitalist.  Large amounts of blood clots were retrieved from left nostril.  He was given vitamin K orally 2.5 mg and has another order of 5 mg IV today.  Per nursing department, ENT is fully aware of all of that and they are waiting for the ENT to come see and provide further management and guidance.  During my assessment, patient is doing well, no further bleeding.  He had dried blood in his left nostril. ?  ?Afib ?-Holding Coumadin, vitamin K given. ?-Rate controlled  with Coreg, Digoxin (3x/week) ?  ?Stage 4 CKD: Is stable.  Lasix on hold for now. ?  ?HTN: Controlled.  Continue Coreg. ? ?HLD ?-Continue Crestor ?  ?BPH ?-Continue Flomax ?  ?Neuropathy/RLS ?-Appears to be taking both Neurontin and Lyrica (med rec is pending but both were listed on his recent ENT clinic med list) ?-Will hold Neurontin and continue Lyrica for now ?-Continue Requip ?  ?Hypothyroidism ?-Continue Synthroid ?  ?MDS ?-Per Dr. Libby Maw last clinic visit on 3/6, there is no  record of prior bone marrow biopsy and patient denies having had this done ?-Hgb has been stable ?-Platelets are also currently stable (although low) ?  ?AAA ?-Stable ?-Followed by vascular, last visit 11/24/21 ?-63-monthsurveillance ?  ? ?DVT prophylaxis: SCDs Start: 01/09/22 1616 ?  Code Status: DNR  ?Family Communication:  None present at bedside.  Plan of care discussed with patient in length and he/she verbalized understanding and agreed with it. ? ?Status is: Observation ?The patient will require care spanning > 2 midnights and should be moved to inpatient because: Needs another day of observation. ? ? ?Estimated body mass index is 24.43 kg/m? as calculated from the following: ?  Height as of 12/01/21: 6' (1.829 m). ?  Weight as of this encounter: 81.7 kg. ? ?  ?Nutritional Assessment: ?Body mass index is 24.43 kg/m?.Marland KitchenMarland Kitchen?Seen by dietician.  I agree with the assessment and plan as outlined below: ?Nutrition Status: ?  ?  ?  ? ?. ?Skin Assessment: ?I have examined the patient's skin and I agree with the wound assessment as performed by the wound care RN as outlined below: ?  ? ?Consultants:  ?ENT ? ?Procedures:  ?Nasal packing ? ?Antimicrobials:  ?Anti-infectives (From admission, onward)  ? ? None  ? ?  ?  ? ? ?Subjective: ?Seen and examined.  Doing well this morning.  No bleeding.  He is not happy that he has to stay in the hospital another night. ? ?Objective: ?Vitals:  ? 01/09/22 2258 01/10/22 0701705/06/23 0793905/06/23 00300 ?BP:  (!) 169/78 (!) 105/42 (!) 143/79  ?Pulse:  95 64 82  ?Resp:  '18 18 16  ' ?Temp:  98.1 ?F (36.7 ?C) (!) 97.5 ?F (36.4 ?C) 97.9 ?F (36.6 ?C)  ?TempSrc:  Oral Oral Oral  ?SpO2: 98% 98% 97% 96%  ?Weight:      ? ? ?Intake/Output Summary (Last 24 hours) at 01/10/2022 1006 ?Last data filed at 01/10/2022 0600 ?Gross per 24 hour  ?Intake 1069.75 ml  ?Output 700 ml  ?Net 369.75 ml  ? ?Filed Weights  ? 01/09/22 2207  ?Weight: 81.7 kg  ? ? ?Examination: ? ?General exam: Appears calm and comfortable   ?Respiratory system: Clear to auscultation. Respiratory effort normal. ?Cardiovascular system: S1 & S2 heard, RRR. No JVD, murmurs, rubs, gallops or clicks. No pedal edema. ?Gastrointestinal system: Abdomen is nondistended, soft and nontender. No organomegaly or masses felt. Normal bowel sounds heard. ?Central nervous system: Alert and oriented. No focal neurological deficits. ?Extremities: Symmetric 5 x 5 power. ?Skin: No rashes, lesions or ulcers ?Psychiatry: Judgement and insight appear normal. Mood & affect appropriate.  ? ? ?Data Reviewed: I have personally reviewed following labs and imaging studies ? ?CBC: ?Recent Labs  ?Lab 01/09/22 ?0923305/05/23 ?2308 01/10/22 ?00076 ?WBC 4.9 5.9 5.4  ?HGB 10.3* 11.0* 10.9*  ?HCT 31.5* 33.9* 32.3*  ?MCV 104.3* 104.0* 102.9*  ?PLT 87* 92* 85*  ? ?Basic Metabolic Panel: ?Recent Labs  ?Lab 01/09/22 ?02263  01/10/22 ?4136  ?NA 143 142  ?K 3.7 3.6  ?CL 106 108  ?CO2 26 24  ?GLUCOSE 117* 102*  ?BUN 54* 49*  ?CREATININE 2.57* 2.25*  ?CALCIUM 11.0* 10.4*  ? ?GFR: ?Estimated Creatinine Clearance: 24 mL/min (A) (by C-G formula based on SCr of 2.25 mg/dL (H)). ?Liver Function Tests: ?No results for input(s): AST, ALT, ALKPHOS, BILITOT, PROT, ALBUMIN in the last 168 hours. ?No results for input(s): LIPASE, AMYLASE in the last 168 hours. ?No results for input(s): AMMONIA in the last 168 hours. ?Coagulation Profile: ?Recent Labs  ?Lab 01/09/22 ?4383 01/10/22 ?7793  ?INR 2.7* 2.2*  ? ?Cardiac Enzymes: ?No results for input(s): CKTOTAL, CKMB, CKMBINDEX, TROPONINI in the last 168 hours. ?BNP (last 3 results) ?No results for input(s): PROBNP in the last 8760 hours. ?HbA1C: ?No results for input(s): HGBA1C in the last 72 hours. ?CBG: ?No results for input(s): GLUCAP in the last 168 hours. ?Lipid Profile: ?No results for input(s): CHOL, HDL, LDLCALC, TRIG, CHOLHDL, LDLDIRECT in the last 72 hours. ?Thyroid Function Tests: ?No results for input(s): TSH, T4TOTAL, FREET4, T3FREE, THYROIDAB in the  last 72 hours. ?Anemia Panel: ?No results for input(s): VITAMINB12, FOLATE, FERRITIN, TIBC, IRON, RETICCTPCT in the last 72 hours. ?Sepsis Labs: ?No results for input(s): PROCALCITON, LATICACIDVEN in the l

## 2022-01-11 DIAGNOSIS — R040 Epistaxis: Secondary | ICD-10-CM | POA: Diagnosis not present

## 2022-01-11 LAB — CBC
HCT: 30.7 % — ABNORMAL LOW (ref 39.0–52.0)
Hemoglobin: 9.8 g/dL — ABNORMAL LOW (ref 13.0–17.0)
MCH: 33.7 pg (ref 26.0–34.0)
MCHC: 31.9 g/dL (ref 30.0–36.0)
MCV: 105.5 fL — ABNORMAL HIGH (ref 80.0–100.0)
Platelets: 78 10*3/uL — ABNORMAL LOW (ref 150–400)
RBC: 2.91 MIL/uL — ABNORMAL LOW (ref 4.22–5.81)
RDW: 13.5 % (ref 11.5–15.5)
WBC: 6 10*3/uL (ref 4.0–10.5)
nRBC: 0 % (ref 0.0–0.2)

## 2022-01-11 LAB — PROTIME-INR
INR: 1.3 — ABNORMAL HIGH (ref 0.8–1.2)
Prothrombin Time: 16.4 seconds — ABNORMAL HIGH (ref 11.4–15.2)

## 2022-01-11 MED ORDER — SALINE SPRAY 0.65 % NA SOLN: 2.0000 | NASAL | 0 refills | Status: DC

## 2022-01-11 NOTE — Plan of Care (Signed)

## 2022-01-11 NOTE — Evaluation (Signed)
Occupational Therapy Evaluation ?Patient Details ?Name: Steven Williams ?MRN: [id number] ?DOB: 15-Aug-1930 ?Today's Date: 01/11/2022 ? ? ?History of Present Illness Steven Williams is a 88 y.o. male who presented with nosebleed that would not stop, also spitting clots. Pt with medical history significant of AAA; BPH; COPD; HTN; HLD; pacemaker placement; hypothyroidism; MDS; afib on Coumadin; recurrent epistaxis; and RA presenting with epistaxis.  ? ?Clinical Impression ?  ?[name] was evaluated s/p the above admission list. He lives independently at [facility], uses a rollator or cane for mobility, is independent for ADLs, and exercises daily. He does not drive. Upon evaluation pt was generally min G for transfers, mobility with RW and LB ADLs. He was set up-supervision for UB ADLs. Initially pt required increased time and close min G due to unsteady balance and general weakness, however he progressed well with increased functional mobility. Pt does not have continued acute OT needs, encouraged frequent mobility with RN staff. Recommend d/c home with continued daily exercise.   ? ?Recommendations for follow up therapy are one component of a multi-disciplinary discharge planning process, led by the attending physician.  Recommendations may be updated based on patient status, additional functional criteria and insurance authorization.  ? ?Follow Up Recommendations ? No OT follow up  ?  ?Assistance Recommended at Discharge Intermittent Supervision/Assistance  ?Patient can return home with the following Assist for transportation;Direct supervision/assist for medications management;A little help with walking and/or transfers;A little help with bathing/dressing/bathroom ? ?  ?Functional Status Assessment ? Patient has had a recent decline in their functional status and demonstrates the ability to make significant improvements in function in a reasonable and predictable amount of time.  ?Equipment Recommendations ? None recommended by  OT  ?  ?Recommendations for Other Services   ? ? ?  ?Precautions / Restrictions Precautions ?Precautions: Fall ?Restrictions ?Weight Bearing Restrictions: No  ? ?  ? ?Mobility Bed Mobility ?Overal bed mobility: Needs Assistance ?Bed Mobility: Supine to Sit ?  ?  ?Supine to sit: Min guard ?  ?  ?General bed mobility comments: incr time and HOB elevated ?  ? ?Transfers ?Overall transfer level: Needs assistance ?Equipment used: Rolling walker (2 wheels) ?Transfers: Sit to/from Stand ?Sit to Stand: Min guard ?  ?  ?  ?  ?  ?  ?  ? ?  ?Balance Overall balance assessment: Needs assistance ?  ?Sitting balance-Leahy Scale: Good ?  ?  ?Standing balance support: During functional activity, Single extremity supported ?Standing balance-Leahy Scale: Fair ?Standing balance comment: in static standing, reaching within BOS ?  ?  ?  ?  ?  ?  ?  ?  ?  ?  ?  ?   ? ?ADL either performed or assessed with clinical judgement  ? ?ADL Overall ADL's : Needs assistance/impaired ?Eating/Feeding: Independent;Sitting ?  ?Grooming: Min guard;Standing ?  ?Upper Body Bathing: Set up;Sitting ?  ?Lower Body Bathing: Min guard;Sit to/from stand ?  ?Upper Body Dressing : Set up;Sitting ?  ?Lower Body Dressing: Min guard;Sit to/from stand ?  ?Toilet Transfer: Min guard;Ambulation;Rolling walker (2 wheels) ?  ?Toileting- Clothing Manipulation and Hygiene: Supervision/safety;Sitting/lateral lean ?  ?  ?  ?Functional mobility during ADLs: Min guard;Rolling walker (2 wheels) ?General ADL Comments: pt stiff/unsteady initially - likely due to best rest and this being the first time up.  ? ? ? ?Vision Baseline Vision/History: 1 Wears glasses ?Ability to See in Adequate Light: 0 Adequate ?Vision Assessment?: No apparent visual deficits  ?   ?Perception   ?  ?  Praxis   ?  ? ?Pertinent Vitals/Pain Pain Assessment ?Pain Assessment: No/denies pain  ? ? ? ?Hand Dominance Right ?  ?Extremity/Trunk Assessment Upper Extremity Assessment ?Upper Extremity Assessment:  Generalized weakness ?  ?Lower Extremity Assessment ?Lower Extremity Assessment: Defer to PT evaluation ?  ?Cervical / Trunk Assessment ?Cervical / Trunk Assessment: Kyphotic ?  ?Communication Communication ?Communication: No difficulties ?  ?Cognition Arousal/Alertness: Awake/alert ?Behavior During Therapy: [facility] for tasks assessed/performed ?Overall Cognitive Status: Within Functional Limits for tasks assessed ?  ?  ?  ?  ?  ?  ?  ?  ?  ?  ?  ?  ?  ?  ?  ?  ?  ?  ?  ?General Comments  VSS on RA, dressing undernose dry ? ?  ?Exercises   ?  ?Shoulder Instructions    ? ? ?Home Living Family/patient expects to be discharged to:: Private residence ?Living Arrangements: Alone ?Available Help at Discharge: Family;Available PRN/intermittently ?Type of Home: Apartment ?Home Access: Level entry ?  ?  ?Home Layout: One level ?  ?  ?Bathroom Shower/Tub: Walk-in shower ?  ?Bathroom Toilet: Standard ?Bathroom Accessibility: Yes ?How Accessible: Accessible via walker ?Home Equipment: Rollator (4 wheels);Cane - single point ?  ?Additional Comments: ILF: [place] ?  ? ?  ?Prior Functioning/Environment Prior Level of Function : Independent/Modified Independent ?  ?  ?  ?  ?  ?  ?Mobility Comments: walks to gym/cafeteria with rollator, walks shorter distances with cane. sometimes walks without AD in apartment ?ADLs Comments: indep - cafeteria for meals ?  ? ?  ?  ?OT Problem List: Decreased strength;Decreased range of motion;Decreased activity tolerance;Impaired balance (sitting and/or standing) ?  ?   ?OT Treatment/Interventions:    ?  ?OT Goals(Current goals can be found in the care plan section) Acute Rehab OT Goals ?Patient Stated Goal: home ?OT Goal Formulation: With patient ?Time For Goal Achievement: 01/11/22 ?Potential to Achieve Goals: Good  ?OT Frequency:   ?  ? ?Co-evaluation   ?  ?  ?  ?  ? ?  ?AM-PAC OT "6 Clicks" Daily Activity     ?Outcome Measure Help from another person eating meals?: None ?Help from another person  taking care of personal grooming?: A Little ?Help from another person toileting, which includes using toliet, bedpan, or urinal?: A Little ?Help from another person bathing (including washing, rinsing, drying)?: A Little ?Help from another person to put on and taking off regular upper body clothing?: A Little ?Help from another person to put on and taking off regular lower body clothing?: A Little ?6 Click Score: 19 ?  ?End of Session Equipment Utilized During Treatment: Gait belt;Rolling walker (2 wheels) ?Nurse Communication: Mobility status ? ?Activity Tolerance: Patient tolerated treatment well ?Patient left: in chair;with call bell/phone within reach;with chair alarm set ? ?OT Visit Diagnosis: Unsteadiness on feet (R26.81);Other abnormalities of gait and mobility (R26.89);Muscle weakness (generalized) (M62.81)  ?              ?Time: 6578-4696 ?OT Time Calculation (min): 20 min ?Charges:  OT General Charges ?$OT Visit: 1 Visit ?OT Evaluation ?$OT Eval Moderate Complexity: 1 Mod ? ? ?Aedin Jeansonne A Ananda Sitzer ?01/11/2022, 9:20 AM ?

## 2022-01-11 NOTE — Evaluation (Signed)
Physical Therapy Evaluation ?Patient Details ?Name: Steven Williams ?MRN: [id number] ?DOB: [birth year] ?Today's Date: 01/11/2022 ? ?History of Present Illness ? Steven Williams is a 88 y.o. male who presented with nosebleed that would not stop, also spitting clots. Pt with medical history significant of AAA; BPH; COPD; HTN; HLD; pacemaker placement; hypothyroidism; MDS; afib on Coumadin; recurrent epistaxis; and RA presenting with epistaxis.  ?Clinical Impression ? PTA, pt lives at [employer], uses a cane vs Rollator for mobility and is independent ADL's. Pt typically walks to gym/cafeteria with assistive device and participates in exercise programs. Pt presents with decreased functional mobility secondary to generalized weakness, postural abnormalities, decreased activity tolerance and impaired standing balance. Pt ambulating 100 ft with a cane with min guard-min assist. Requires several standing rest breaks due to fatigue. Recommended use of Rollator initially at home for bilateral hand support and energy conservation. Would benefit from follow up HHPT to address deficits. ?   ? ?Recommendations for follow up therapy are one component of a multi-disciplinary discharge planning process, led by the attending physician.  Recommendations may be updated based on patient status, additional functional criteria and insurance authorization. ? ?Follow Up Recommendations Home health PT (at [facility]) ? ?  ?Assistance Recommended at Discharge Frequent or constant Supervision/Assistance  ?Patient can return home with the following ? A little help with walking and/or transfers;A little help with bathing/dressing/bathroom;Assistance with cooking/housework;Assist for transportation ? ?  ?Equipment Recommendations None recommended by PT  ?Recommendations for Other Services ?    ?  ?Functional Status Assessment Patient has had a recent decline in their functional status and demonstrates the ability to make significant improvements in  function in a reasonable and predictable amount of time.  ? ?  ?Precautions / Restrictions Precautions ?Precautions: Fall ?Restrictions ?Weight Bearing Restrictions: No  ? ?  ? ?Mobility ? Bed Mobility ?  ?  ?  ?  ?  ?  ?  ?General bed mobility comments: OOB in chair ?  ? ?Transfers ?Overall transfer level: Needs assistance ?Equipment used: Straight cane ?Transfers: Sit to/from Stand ?Sit to Stand: Supervision ?  ?  ?  ?  ?  ?General transfer comment: Slow to rise, no physical assist required ?  ? ?Ambulation/Gait ?Ambulation/Gait assistance: Min guard, Min assist ?Gait Distance (Feet): 100 Feet ?Assistive device: Straight cane ?Gait Pattern/deviations: Step-to pattern, Decreased dorsiflexion - right, Decreased dorsiflexion - left, Shuffle, Trunk flexed ?Gait velocity: decreased ?Gait velocity interpretation: <1.8 ft/sec, indicate of risk for recurrent falls ?  ?General Gait Details: Pt with shuffling, step to gait pattern with increased trunk/hip flexion and decreased bilateral foot clearance. Cues for upright posture, activity pacing, and foot clearance during swing phase ? ?Stairs ?  ?  ?  ?  ?  ? ?Wheelchair Mobility ?  ? ?Modified Rankin (Stroke Patients Only) ?  ? ?  ? ?Balance Overall balance assessment: Needs assistance ?  ?Sitting balance-Leahy Scale: Good ?  ?  ?Standing balance support: During functional activity, Single extremity supported ?Standing balance-Leahy Scale: Poor ?Standing balance comment: reliant on at least single UE support ?  ?  ?  ?  ?  ?  ?  ?  ?  ?  ?  ?   ? ? ? ?Pertinent Vitals/Pain Pain Assessment ?Pain Assessment: Faces ?Faces Pain Scale: Hurts even more ?Pain Location: low back ?Pain Descriptors / Indicators: Discomfort, Grimacing, Guarding ?Pain Intervention(s): Limited activity within patient's tolerance, Monitored during session  ? ? ?Home Living Family/patient expects to be discharged  to:: Private residence ?Living Arrangements: Alone ?Available Help at Discharge:  Family;Available PRN/intermittently ?Type of Home: Apartment ?Home Access: Level entry ?  ?  ?  ?Home Layout: One level ?Home Equipment: Rollator (4 wheels);Cane - single point ?Additional Comments: ILF: [place]  ?  ?Prior Function Prior Level of Function : Independent/Modified Independent ?  ?  ?  ?  ?  ?  ?Mobility Comments: walks to gym/cafeteria with rollator, walks shorter distances with cane. sometimes walks without AD in apartment ?ADLs Comments: indep - cafeteria for meals ?  ? ? ?Hand Dominance  ? Dominant Hand: Right ? ?  ?Extremity/Trunk Assessment  ? Upper Extremity Assessment ?Upper Extremity Assessment: Defer to OT evaluation ?  ? ?Lower Extremity Assessment ?Lower Extremity Assessment: RLE deficits/detail;LLE deficits/detail ?RLE Deficits / Details: Strength 5/5 ?RLE Coordination: decreased gross motor ?LLE Deficits / Details: Strength 5/5 ?LLE Coordination: decreased gross motor ?  ? ?Cervical / Trunk Assessment ?Cervical / Trunk Assessment: Kyphotic  ?Communication  ? Communication: No difficulties  ?Cognition Arousal/Alertness: Awake/alert ?Behavior During Therapy: [facility] for tasks assessed/performed ?Overall Cognitive Status: Within Functional Limits for tasks assessed ?  ?  ?  ?  ?  ?  ?  ?  ?  ?  ?  ?  ?  ?  ?  ?  ?  ?  ?  ? ?  ?General Comments General comments (skin integrity, edema, etc.): VSS on RA, dressing undernose dry ? ?  ?Exercises    ? ?Assessment/Plan  ?  ?PT Assessment Patient needs continued PT services  ?PT Problem List Decreased strength;Decreased activity tolerance;Decreased balance;Decreased mobility ? ?   ?  ?PT Treatment Interventions DME instruction;Gait training;Functional mobility training;Therapeutic activities;Therapeutic exercise;Balance training;Patient/family education   ? ?PT Goals (Current goals can be found in the Care Plan section)  ?Acute Rehab PT Goals ?Patient Stated Goal: get back to baseline ?PT Goal Formulation: With patient ?Time For Goal Achievement:  01/25/22 ?Potential to Achieve Goals: Good ? ?  ?Frequency Min 3X/week ?  ? ? ?Co-evaluation   ?  ?  ?  ?  ? ? ?  ?AM-PAC PT "6 Clicks" Mobility  ?Outcome Measure Help needed turning from your back to your side while in a flat bed without using bedrails?: A Little ?Help needed moving from lying on your back to sitting on the side of a flat bed without using bedrails?: A Little ?Help needed moving to and from a bed to a chair (including a wheelchair)?: A Little ?Help needed standing up from a chair using your arms (e.g., wheelchair or bedside chair)?: A Little ?Help needed to walk in hospital room?: A Little ?Help needed climbing 3-5 steps with a railing? : A Lot ?6 Click Score: 17 ? ?  ?End of Session Equipment Utilized During Treatment: Gait belt ?Activity Tolerance: Patient tolerated treatment well ?Patient left: in chair;with call bell/phone within reach ?Nurse Communication: Mobility status ?PT Visit Diagnosis: Unsteadiness on feet (R26.81);Other abnormalities of gait and mobility (R26.89);Muscle weakness (generalized) (M62.81);Difficulty in walking, not elsewhere classified (R26.2) ?  ? ?Time: 4627-0350 ?PT Time Calculation (min) (ACUTE ONLY): 18 min ? ? ?Charges:   PT Evaluation ?$PT Eval Low Complexity: 1 Low ?  ?  ?   ? ? ?Wyona Almas, PT, DPT ?Acute Rehabilitation Services ?Pager [phone] ?Office [phone] ? ? ?Deno Etienne ?01/11/2022, 12:03 PM ? ?

## 2022-01-11 NOTE — Discharge Summary (Signed)
PatientPhysician Discharge Summary  ?Steven Williams ZOX:[id number] DOB: 02-Mar-1930 DOA: 01/09/2022 ? ?PCP: Ginger Organ., MD ? ?Admit date: 01/09/2022 ?Discharge date: 01/11/2022 ?30 Day Unplanned Readmission Risk Score   ? ?Flowsheet Row ED to Hosp-Admission (Current) from 01/09/2022 in [facility]  ?30 Day Unplanned Readmission Risk Score (%) 19.73 Filed at 01/11/2022 0801  ? ?  ? ? This score is the patient's risk of an unplanned readmission within 30 days of being discharged (0 -100%). The score is based on dignosis, age, lab data, medications, orders, and past utilization.   ?Low:  0-14.9   Medium: 15-21.9   High: 22-29.9   Extreme: 30 and above ? ?  ? ?  ? ? ? ?Admitted From: Home ?Disposition: Home ? ?Recommendations for Outpatient Follow-up:  ?Follow up with PCP in 1-2 weeks ?Please obtain BMP/CBC in one week ?Please follow-up with ENT in 1 to 2 days for removal of the nasal packing. ?Please follow up with your PCP on the following pending results: ?Unresulted Labs (From admission, onward)  ? ?  Start     Ordered  ? 01/11/22 0500  Protime-INR  Daily,   R     ? 01/10/22 1417  ? ?  ?  ? ?  ?  ? ? ?Home Health: None ?Equipment/Devices: None ? ?Discharge Condition: Stable ?CODE STATUS: DNR ?Diet recommendation: Cardiac  ? ?Subjective: Seen and examined.  No complaints.  Patient has not had any nasal bleeding since 30 hours now.  He is excited to go home. ? ?Following HPI and ED course is copied from my colleague admitting hospitalist Dr. Lorin [facility] H&P ?HPI: Steven Williams is a 88 y.o. male with medical history significant of AAA; BPH; COPD; HTN; HLD; pacemaker placement; hypothyroidism; MDS; afib on Coumadin; recurrent epistaxis; and RA presenting with epistaxis.  He was last seen by ENT (Dr. Fredric Dine) for this issue and seemed to be improved at that time.  He reports that he started with nosebleeds when he moved to [place] 3 years ago.  He was having recurrent bleeds and then tried vaseline which worked  for a period of time.  He had another recent nosebleed and then started with this one last night and it just won't stop bleeding.  Packing was placed in the ER and he is now experiencing posterior bleeding and spitting clots.  It is very uncomfortable.   ?  ?His daughter reports that he was in the hospital 4 times last summer because the epistaxis would not stop.  The Coumadin has been a better option until recently - ?allergies.   ?  ?The patient and I discussed possible options including embolization and Coumadin reversal - both short-term and long-term. ?I called and left a message for Dr. Fredric Dine; Dr. Langston Masker already spoke with her. ?  ?If also spoke with neurointerventional radiology - They only embolize if packing fails.  ENT usually tries packing before calling IR.  Dr. Kathee Delton is on call this weekend for embolization, if needed.   ?  ?  ?  ?ER Course:  Afib with recurrent nosebleeds, here with persistent nosebleed.  Has had pressure, cauterization, Merocele.  Has improved but still oozing.  INR 2.7.  Talked with ENT - he bleeds frequently, usually stops on his own, recommends observation and holding Coumadin.  Reports not a candidate for ablation or embolization.   ? ?Brief/Interim Summary: Detailed hospitalization as below. ? ?Recurrent epistaxis:  Patient has a known history of recurrent epistaxis  from  the left nostril.  In the emergency department patient was treated with Afrin as well as direct application of TXA and insertion of 10 cm Merocel nasal packing.  Dr. Fredric Dine with ENT was consulted who applied Floseal dissolvable packing in the left nasal cavity as well as Posisep achieving hemostasis.  Patient began to exhibit recurrent bleeding later that afternoon which was addressed by ED nursing with reported attempts to place to Afrin-soaked cotton balls in the left nare.  This was unsuccessful and the patient continued to exhibit bleeding.  He then had bleeding on the floor and he was attended  by the night hospitalist.  Large amounts of blood clots were retrieved from left nostril.  He was given vitamin K orally 2.5 mg and another dose of IV 5 mg vitamin K. Coumadin was held.  He was seen by ENT again on the morning of 01/10/2022.  He did not have any active bleeding.  Recommended maintaining nasal drip that in place to discourage nasal manipulation.  He was started on clear liquid diet.  Since he has had not had any bleeding, we have started him on cardiac diet now.  I have discussed in length with ENT Dr. Fredric Dine who has cleared the patient for discharge with instructions to continue nasal saline every 4 hours and rest of the home medications and she will follow-up with patient in clinic in 1 to 2 days for removal of the packing.  Patient's INR is 1.3.  I also discussed Coumadin with ENT and she was okay with resuming the Coumadin as well.  Since his INR is 1.3, even if he resumes his Coumadin today, I think his INR will remain subtherapeutic for at least 1 to 2 days which will give him a break for at least 48 hours to 72 hours to help maintain hemostasis.  I have discussed all of this with patient's daughter and she is in agreement and she verbalized understanding as well. ?  ?Afib: Resume home medications. ?  ?Stage 4 CKD: Is stable.   ?  ?HTN: Controlled.  Continue Coreg. ?  ?HLD ?-Continue Crestor ?  ?BPH ?-Continue Flomax ?  ?MDS ?-Per Dr. Libby Maw last clinic visit on 3/6, there is no record of prior bone marrow biopsy and patient denies having had this done ?-Hgb has been stable ?-Platelets are also currently stable (although low) ?  ?AAA ?-Stable ?-Followed by vascular, last visit 11/24/21 ?-79-monthsurveillance ? ?Discharge plan was discussed with patient and/or family member and they verbalized understanding and agreed with it.  ?Discharge Diagnoses:  ?Principal Problem: ?  Recurrent epistaxis ?Active Problems: ?  Benign prostatic hyperplasia without lower urinary tract symptoms ?  CKD (chronic  kidney disease) stage 4, GFR 15-29 ml/min (HCC) ?  Essential hypertension ?  Hyperlipidemia ?  Hypothyroidism ?  Paroxysmal atrial fibrillation (HCC) ?  Polyneuropathy ?  Restless legs syndrome ?  DNR (do not resuscitate) ?  Epistaxis ? ? ? ?Discharge Instructions ? ? ?Allergies as of 01/11/2022   ? ?   Reactions  ? Iodinated Contrast Media Rash  ? ?  ? ?  ?Medication List  ?  ? ?TAKE these medications   ? ?acetaminophen 650 MG CR tablet ?Commonly known as: TYLENOL ?Take 1,300 mg by mouth in the morning. ?  ?allopurinol 100 MG tablet ?Commonly known as: ZYLOPRIM ?Take 100 mg by mouth daily. ?  ?carvedilol 6.25 MG tablet ?Commonly known as: COREG ?Take 6.25 mg by mouth 2 (two) times daily with a  meal. ?  ?dicyclomine 10 MG capsule ?Commonly known as: BENTYL ?Take 30 mg by mouth in the morning and at bedtime. ?  ?digoxin 0.125 MG tablet ?Commonly known as: LANOXIN ?Take 0.125 mg by mouth every Monday, Wednesday, and Friday. ?  ?furosemide 40 MG tablet ?Commonly known as: LASIX ?Take 40 mg by mouth in the morning. ?  ?gabapentin 400 MG capsule ?Commonly known as: NEURONTIN ?Take 400 mg by mouth at bedtime. ?  ?hydrocortisone 2.5 % rectal cream ?Commonly known as: ANUSOL-HC ?Apply 1 application topically 2 (two) times daily as needed for hemorrhoids or anal itching. ?  ?Iron 325 (65 Fe) MG Tabs ?Take 1 tablet by mouth 2 (two) times daily. ?  ?levothyroxine 100 MCG tablet ?Commonly known as: SYNTHROID ?Take 100 mcg by mouth daily before breakfast. ?  ?mupirocin ointment 2 % ?Commonly known as: BACTROBAN ?Apply 1 application. topically See admin instructions. Apply 1/2 inch strip to bilateral nares twice daily for 14 days and then as needed for crusting ?  ?pantoprazole 40 MG tablet ?Commonly known as: PROTONIX ?Take 40 mg by mouth daily before breakfast. ?  ?pregabalin 75 MG capsule ?Commonly known as: LYRICA ?Take 75 mg by mouth at bedtime. ?  ?PRESERVISION AREDS PO ?Take 1 capsule by mouth 2 (two) times daily. ?   ?REFRESH OP ?Place 1 drop into both eyes 4 (four) times daily. ?  ?rOPINIRole 2 MG tablet ?Commonly known as: REQUIP ?Take 2 mg by mouth at bedtime. ?  ?saw palmetto 500 MG capsule ?Take 500 mg by mouth daily.

## 2022-01-11 NOTE — TOC Transition Note (Signed)
Transition of Care (TOC) - CM/SW Discharge Note ? ? ?Patient Details  ?Name: [name] ?MRN: [id number] ?Date of Birth: 1930/04/12 ? ?Transition of Care (TOC) CM/SW Contact:  ?Bartholomew Crews, RN ?Phone Number: [phone] ?01/11/2022, 10:27 AM ? ? ?Clinical Narrative:    ? ?Patient to transition home today. No TOC needs identified at this time.  ? ?Final next level of care: Home/Self Care ?Barriers to Discharge: No Barriers Identified ? ? ?Patient Goals and CMS Choice ?  ?  ?  ? ?Discharge Placement ?  ?           ?  ?  ?  ?  ? ?Discharge Plan and Services ?  ?  ?           ?  ?  ?  ?  ?  ?  ?  ?  ?  ?  ? ?Social Determinants of Health (SDOH) Interventions ?  ? ? ?Readmission Risk Interventions ?   ? View : No data to display.  ?  ?  ?  ? ? ? ? ? ?

## 2022-01-11 NOTE — Progress Notes (Signed)
Patient ready for d/c, awaiting for ride. ?

## 2022-01-11 NOTE — Progress Notes (Signed)
OT found synthroid pill on patient's bed. This RN gave him 1 dose and notified MD. ?

## 2022-01-11 NOTE — TOC Transition Note (Signed)
Transition of Care (TOC) - CM/SW Discharge Note ? ? ?Patient Details  ?Name: Yousof Alderman ?MRN: [id number] ?Date of Birth: 11-03-1929 ? ?Transition of Care (TOC) CM/SW Contact:  ?Bartholomew Crews, RN ?Phone Number: [phone] ?01/11/2022, 12:04 PM ? ? ?Clinical Narrative:    ? ?Notified by PT that patient would benefit from home health PT at home. [facility]. DC summary and [facility] PT orders sent to liaison at [facility]. No further TOC needs identified at this time.    ? ?Final next level of care: Home/Self Care ?Barriers to Discharge: No Barriers Identified ? ? ?Patient Goals and CMS Choice ?  ?  ?  ? ?Discharge Placement ?  ?           ?  ?  ?  ?  ? ?Discharge Plan and Services ?  ?  ?           ?  ?  ?  ?  ?  ?  ?  ?  ?  ?  ? ?Social Determinants of Health (SDOH) Interventions ?  ? ? ?Readmission Risk Interventions ?   ? View : No data to display.  ?  ?  ?  ? ? ? ? ? ?

## 2022-01-12 ENCOUNTER — Encounter: Payer: Self-pay | Admitting: Podiatry

## 2022-01-12 ENCOUNTER — Ambulatory Visit (INDEPENDENT_AMBULATORY_CARE_PROVIDER_SITE_OTHER): Payer: Medicare Other | Admitting: Podiatry

## 2022-01-12 DIAGNOSIS — M79675 Pain in left toe(s): Secondary | ICD-10-CM

## 2022-01-12 DIAGNOSIS — I739 Peripheral vascular disease, unspecified: Secondary | ICD-10-CM

## 2022-01-12 DIAGNOSIS — M79674 Pain in right toe(s): Secondary | ICD-10-CM

## 2022-01-12 DIAGNOSIS — B351 Tinea unguium: Secondary | ICD-10-CM

## 2022-01-12 DIAGNOSIS — L84 Corns and callosities: Secondary | ICD-10-CM

## 2022-01-20 NOTE — Progress Notes (Signed)
?  Subjective:  ?Patient ID: Steven Williams, male    DOB: 1929/12/04,  MRN: [id number] ? ?Steven Williams presents to clinic today for for at risk foot care. Patient has h/o PAD and callus(es) right foot and painful thick toenails that are difficult to trim. Painful toenails interfere with ambulation. Aggravating factors include wearing enclosed shoe gear. Pain is relieved with periodic professional debridement. Painful calluses are aggravated when weightbearing with and without shoegear. Pain is relieved with periodic professional debridement. ? ?New problem(s): None.  ? ?PCP is Ginger Organ., MD , and last visit was March, 2023. ? ?Allergies  ?Allergen Reactions  ? Iodinated Contrast Media Rash  ? ? ?Review of Systems: Negative except as noted in the HPI. ? ?Objective: No changes noted in today's physical examination. ?General: Patient is a pleasant 88 y.o. Caucasian male WD, WN in NAD. AAO x 3.  ? ?Neurovascular Examination: ?CFT <4 seconds b/l LE. Diminished pedal pulses b/l. Pedal hair absent b/l. Skin temperature gradient warm to cool b/l. No pain with calf compression b/l. No cyanosis or clubbing noted b/l LE. ? ?Protective sensation intact 5/5 intact bilaterally with 10g monofilament b/l. Vibratory sensation intact b/l. ? ?Dermatological:  ?Pedal skin thin, shiny and atrophic b/l LE.  No open wounds b/l. No interdigital macerations b/l. Toenails 1-5 b/l elongated, thickened, discolored with subungual debris. +Tenderness with dorsal palpation of nailplates. Hyperkeratotic lesion(s) R hallux and submet head 1 right foot.  No erythema, no edema, no drainage, no fluctuance.  ?Musculoskeletal:  ?Normal muscle strength 5/5 to all lower extremity muscle groups bilaterally. Hammertoe deformity noted 1-5 b/l. Pes planovalgus deformity noted b/l lower extremities.. No pain, crepitus or joint limitation noted with ROM b/l LE.  Patient ambulates independently without assistive aids. ? ?Assessment/Plan: ?1. Pain due to  onychomycosis of toenails of both feet   ?2. Callus   ?3. PAD (peripheral artery disease) ([place])   ?  ?-Examined patient. ?-Patient to continue soft, supportive shoe gear daily. ?-Mycotic toenails 1-5 bilaterally were debrided in length and girth with sterile nail nippers and dremel without incident. ?-Callus(es) R hallux and submet head 1 right foot pared utilizing sterile scalpel blade without complication or incident. Total number debrided =2. ?-Patient/POA to call should there be question/concern in the interim.  ? ?Return in about 3 months (around 04/14/2022). ? ?Marzetta Board, DPM  ?

## 2022-01-26 ENCOUNTER — Telehealth: Payer: Self-pay | Admitting: Internal Medicine

## 2022-01-26 NOTE — Telephone Encounter (Signed)
Pt c/o medication issue:  1. Name of Medication:   warfarin (COUMADIN) 2 MG tablet    2. How are you currently taking this medication (dosage and times per day)? Take 2 mg by mouth at bedtime  3. Are you having a reaction (difficulty breathing--STAT)? No  4. What is your medication issue? Pt states that at a recent hospital visit he was advised tho stop taking medication due to nose bleeds. Pt would like to get second opinion from nurse or Dr. Rayann Heman. Please advise

## 2022-01-27 NOTE — Telephone Encounter (Signed)
Left message for patient to call back  

## 2022-01-28 NOTE — Telephone Encounter (Signed)
They held it during a recent admission, but it remains on his list and the instructions in his discharge summary were to resume.  He has also still been taking per ENT note 5/17, so it's not clear what the question is?   He should continue if possible given his relatively high risk of stroke. He had been doing well on coumadin, with less bleeding than eliquis, until recently according to ER notes. I cannot tell where he was getting his INR checked; It does not appear we were managing his coumadin. He will need to be sure he continues to get his INR checked regularly, and ASAP if it has been some time since it has been checked.   Steven Williams [address]" [place], PA-C  01/27/2022 8:23 AM    I spoke with patient and gave him information from [name], [name].  Patient reports after discharge his primary care doctor advised him to stop coumadin.  Patient decided to resume coumadin on his own.  He restarted it about 5 days ago.  His INR is checked by his PCP and has not been checked since patient restarted coumadin.  He will contact PCP this afternoon or tomorrow morning and ask to have INR checked as soon as possible.

## 2022-01-28 NOTE — Telephone Encounter (Signed)
Pt is returning call.  

## 2022-02-09 NOTE — Progress Notes (Shared)
[facility] Note  02/11/2022     CHIEF COMPLAINT Patient presents for No chief complaint on file.    HISTORY OF PRESENT ILLNESS: Steven Williams is a 88 y.o. male who presents to the clinic today for:     Referring physician:   HISTORICAL INFORMATION:   Selected notes from the MEDICAL RECORD NUMBER Referred by [name], [place]   CURRENT MEDICATIONS: Current Outpatient Medications (Ophthalmic Drugs)  Medication Sig   Polyvinyl Alcohol-Povidone (REFRESH OP) Place 1 drop into both eyes 4 (four) times daily.   No current facility-administered medications for this visit. (Ophthalmic Drugs)   Current Outpatient Medications (Other)  Medication Sig   acetaminophen (TYLENOL) 650 MG CR tablet Take 1,300 mg by mouth in the morning.   allopurinol (ZYLOPRIM) 100 MG tablet Take 100 mg by mouth daily.   carvedilol (COREG) 6.25 MG tablet Take 6.25 mg by mouth 2 (two) times daily with a meal.   Cholecalciferol (VITAMIN D3 PO) Take 1 capsule by mouth in the morning and at bedtime.   Cholecalciferol (VITAMIN D3) 50 MCG (2000 UT) TABS Take 2,000 Units by mouth daily.   dicyclomine (BENTYL) 10 MG capsule Take 30 mg by mouth in the morning and at bedtime.   digoxin (LANOXIN) 0.125 MG tablet Take 0.125 mg by mouth every Monday, Wednesday, and Friday.   Ferrous Sulfate (IRON) 325 (65 Fe) MG TABS Take 1 tablet by mouth 2 (two) times daily.   furosemide (LASIX) 40 MG tablet Take 40 mg by mouth in the morning.   gabapentin (NEURONTIN) 400 MG capsule Take 400 mg by mouth at bedtime.   hydrocortisone (ANUSOL-HC) 2.5 % rectal cream Apply 1 application topically 2 (two) times daily as needed for hemorrhoids or anal itching.   levothyroxine (SYNTHROID) 100 MCG tablet Take 100 mcg by mouth daily before breakfast.   Multiple Vitamins-Minerals (PRESERVISION AREDS PO) Take 1 capsule by mouth 2 (two) times daily.   mupirocin ointment (BACTROBAN) 2 % Apply 1 application.  topically See admin instructions. Apply 1/2 inch strip to bilateral nares twice daily for 14 days and then as needed for crusting   pantoprazole (PROTONIX) 40 MG tablet Take 40 mg by mouth daily before breakfast.   pregabalin (LYRICA) 75 MG capsule Take 75 mg by mouth at bedtime.   rOPINIRole (REQUIP) 2 MG tablet Take 2 mg by mouth at bedtime.   saw palmetto 500 MG capsule Take 500 mg by mouth daily.   sodium chloride (OCEAN) 0.65 % SOLN nasal spray Place 2 sprays into both nostrils every 4 (four) hours while awake.   tamsulosin (FLOMAX) 0.4 MG CAPS capsule Take 0.4 mg by mouth daily.   vitamin B-12 (CYANOCOBALAMIN) 1000 MCG tablet Take 1 tablet (1,000 mcg total) by mouth daily.   warfarin (COUMADIN) 2 MG tablet Take 2 mg by mouth at bedtime.   Current Facility-Administered Medications (Other)  Medication Route   Bevacizumab (AVASTIN) SOLN 1.25 mg Intravitreal   diphenhydrAMINE (BENADRYL) capsule 50 mg Oral   REVIEW OF SYSTEMS:   ALLERGIES Allergies  Allergen Reactions   Iodinated Contrast Media Rash   PAST MEDICAL HISTORY Past Medical History:  Diagnosis Date   Antiphospholipid antibody positive    Aortic aneurysm (HCC)    thoracic, Dr Roxan Hockey, with h/o dissection   BPH (benign prostatic hyperplasia)    Central retinal artery occlusion    Chronic anemia    Chronic renal insufficiency    COPD (chronic obstructive pulmonary disease) ([place])  DDD (degenerative disc disease), cervical    DJD (degenerative joint disease)    DVT (deep venous thrombosis) (HCC)    Gastric dysmotility    GERD (gastroesophageal reflux disease)    Gout    HTN (hypertension)    Hyperlipidemia    Hypertensive retinopathy    OU   Hypothyroid    Macular degeneration    Myelodysplasia (myelodysplastic syndrome) (HCC)    Paget's disease of bone    Peripheral neuropathy    Permanent atrial fibrillation (HCC)    Recurrent epistaxis 06/26/2020   Rheumatoid arthritis (HCC)    TIA (transient  ischemic attack)    Past Surgical History:  Procedure Laterality Date   ATRIAL FIBRILLATION ABLATION  09/23/2014   Dr Encarnacion Chu in [place]     lumbar, x 2   CAROTID ENDARTERECTOMY Right 1990   CAROTID STENT     CATARACT EXTRACTION Bilateral 2010   [place]   EYE SURGERY Bilateral 2010   [facility] S[facility]   LUNG DECORTICATION     VATS   PACEMAKER GENERATOR CHANGE  12/03/2011   [facility]T [facility] ADDR01 generator change by Dr Encarnacion Chu for symptomatic bradycardia   PACEMAKER IMPLANT  2004   MDT    FAMILY HISTORY Family History  Problem Relation Age of Onset   Cirrhosis Mother    Stroke Father    Lung cancer Sister    Lung cancer Brother    SOCIAL HISTORY Social History   Tobacco Use   Smoking status: Former    Packs/day: 2.00    Years: 12.00    Pack years: 24.00    Types: Cigarettes    Quit date: 09/07/1962    Years since quitting: 59.4   Smokeless tobacco: Never  Vaping Use   Vaping Use: Never used  Substance Use Topics   Alcohol use: Yes    Alcohol/week: 2.0 standard drinks    Types: 2 Glasses of wine per week    Comment: daily   Drug use: Never       OPHTHALMIC EXAM: Not recorded    IMAGING AND PROCEDURES  Imaging and Procedures for '@TODAY'$ @          ASSESSMENT/PLAN:  No diagnosis found.   1,2. 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    OD- w/ inactive scar -- history of low vision, CF 3'  OS- w/ CNV   - history of multiple IVL OS in [place], [place] w/ Dr. Doristine Church -- last injection 06/2018, q4-6 wk interval  - moved to [place] in November 2019 and was followed by Dr. Baird Cancer who had recommended observation, thus pt sought second opinion  - given functional monocular status and history of active CNV, has been receiving maintenance injections, s/p IVA #1 OS (02.25.20), #2 (04.07.20), #3 (05.05.20), #4 (06.22.20), #5 (08.03.20), #6 (03.22.21)  - s/p IVE OS #1 (09.14.20), #2 (10.23.20), #3 (11.30.20), #4 (01.11.21), #5 (02.15.21), #6 (03.22.21),  #7 (05.03.21), #8 (06.16.21), #9 (07.26.21), #10 (09.07.21), #11 (10.19.21), #12 (12.01.2021), #13 (01.19.22), #14 (03.09.22), #15 (05.12.22), #16 (07.08.22), #17 (09.07.22), #18 (11.09.22), #19 (01.11.23), #20 (03.22.23)  - OCT shows Stable improvement in cystic changes, persistent GA; OD with inactive scar at 10 weeks  - BCVA decreased to 20/70 from 20/50 OS -- mostly dry eyes  - recommend IVE OS # 21 (06.07.23) w/ ext f/u in 11 wks  - [employer] benefits investigation started 9.14.20 -- approved for 2023  - f/u 11 weeks -- DFE, OCT, likely injection  3,4. Hypertensive retinopathy  OU  - discussed importance of tight BP control  - monitor   5. Pseudophakia OU  - s/p CE/IOL OU  - monitor   6. Dry eyes OU  - 2-3+ PEE OU (OS > OD)  - pt using AT 4-5x a day OU + lubricating ointment at night  - BCVA improved back to baseline OS -- 20/50 - recommend inc artificial tears to 5-6x/day - cont lubricating ointment at night - monitor   Ophthalmic Meds Ordered this visit:  No orders of the defined types were placed in this encounter.    No follow-ups on file.  There are no Patient Instructions on file for this visit.  This document serves as a record of services personally performed by Gardiner Sleeper, MD, PhD. It was created on their behalf by Orvan Falconer, an ophthalmic technician. The creation of this record is the provider's dictation and/or activities during the visit.    Electronically signed by: Orvan Falconer, OA, 02/09/22  10:17 AM   Gardiner Sleeper, M.D., Ph.D. Diseases & Surgery of the Retina and Vitreous [facility]  I have reviewed the above documentation for accuracy and completeness, and I agree with the above. Gardiner Sleeper, M.D., Ph.D. 11/29/21 10:17 AM   Abbreviations: M myopia (nearsighted); A astigmatism; H hyperopia (farsighted); P presbyopia; Mrx spectacle prescription;  CTL contact lenses; OD right eye; OS left eye; OU both eyes  XT  exotropia; ET esotropia; PEK punctate epithelial keratitis; PEE punctate epithelial erosions; DES dry eye syndrome; MGD meibomian gland dysfunction; ATs artificial tears; PFAT's preservative free artificial tears; [place] nuclear sclerotic cataract; PSC posterior subcapsular cataract; ERM epi-retinal membrane; PVD posterior vitreous detachment; RD retinal detachment; DM diabetes mellitus; DR diabetic retinopathy; NPDR non-proliferative diabetic retinopathy; PDR proliferative diabetic retinopathy; CSME clinically significant macular edema; DME diabetic macular edema; dbh dot blot hemorrhages; CWS cotton wool spot; POAG primary open angle glaucoma; C/D cup-to-disc ratio; HVF humphrey visual field; GVF goldmann visual field; OCT optical coherence tomography; IOP intraocular pressure; BRVO Branch retinal vein occlusion; CRVO central retinal vein occlusion; CRAO central retinal artery occlusion; BRAO branch retinal artery occlusion; RT retinal tear; SB scleral buckle; PPV pars plana vitrectomy; VH Vitreous hemorrhage; PRP panretinal laser photocoagulation; IVK intravitreal kenalog; VMT vitreomacular traction; MH Macular hole;  NVD neovascularization of the disc; NVE neovascularization elsewhere; AREDS age related eye disease study; ARMD age related macular degeneration; POAG primary open angle glaucoma; EBMD epithelial/anterior basement membrane dystrophy; ACIOL anterior chamber intraocular lens; IOL intraocular lens; PCIOL posterior chamber intraocular lens; Phaco/IOL phacoemulsification with intraocular lens placement; [place] photorefractive keratectomy; LASIK laser assisted in situ keratomileusis; HTN hypertension; DM diabetes mellitus; COPD chronic obstructive pulmonary disease

## 2022-02-11 ENCOUNTER — Inpatient Hospital Stay: Payer: Medicare Other

## 2022-02-11 ENCOUNTER — Inpatient Hospital Stay: Payer: Medicare Other | Admitting: Hematology and Oncology

## 2022-02-11 ENCOUNTER — Encounter (INDEPENDENT_AMBULATORY_CARE_PROVIDER_SITE_OTHER): Payer: Medicare Other | Admitting: Ophthalmology

## 2022-02-11 DIAGNOSIS — H353221 Exudative age-related macular degeneration, left eye, with active choroidal neovascularization: Secondary | ICD-10-CM

## 2022-02-11 DIAGNOSIS — I10 Essential (primary) hypertension: Secondary | ICD-10-CM

## 2022-02-11 DIAGNOSIS — H35033 Hypertensive retinopathy, bilateral: Secondary | ICD-10-CM

## 2022-02-11 DIAGNOSIS — H353213 Exudative age-related macular degeneration, right eye, with inactive scar: Secondary | ICD-10-CM

## 2022-02-11 DIAGNOSIS — H04123 Dry eye syndrome of bilateral lacrimal glands: Secondary | ICD-10-CM

## 2022-02-11 DIAGNOSIS — Z961 Presence of intraocular lens: Secondary | ICD-10-CM

## 2022-02-15 ENCOUNTER — Other Ambulatory Visit: Payer: Self-pay

## 2022-02-15 ENCOUNTER — Emergency Department (HOSPITAL_BASED_OUTPATIENT_CLINIC_OR_DEPARTMENT_OTHER)
Admission: EM | Admit: 2022-02-15 | Discharge: 2022-02-15 | Disposition: A | Discharge status: Home / Self Care | Payer: Medicare Other | Attending: Emergency Medicine | Admitting: Emergency Medicine

## 2022-02-15 ENCOUNTER — Encounter (HOSPITAL_BASED_OUTPATIENT_CLINIC_OR_DEPARTMENT_OTHER): Payer: Self-pay | Admitting: Obstetrics and Gynecology

## 2022-02-15 DIAGNOSIS — W228XXA Striking against or struck by other objects, initial encounter: Secondary | ICD-10-CM | POA: Insufficient documentation

## 2022-02-15 DIAGNOSIS — S81812A Laceration without foreign body, left lower leg, initial encounter: Secondary | ICD-10-CM | POA: Insufficient documentation

## 2022-02-15 DIAGNOSIS — J449 Chronic obstructive pulmonary disease, unspecified: Secondary | ICD-10-CM | POA: Diagnosis not present

## 2022-02-15 DIAGNOSIS — Y9389 Activity, other specified: Secondary | ICD-10-CM | POA: Diagnosis not present

## 2022-02-15 DIAGNOSIS — R600 Localized edema: Secondary | ICD-10-CM | POA: Insufficient documentation

## 2022-02-15 DIAGNOSIS — I4891 Unspecified atrial fibrillation: Secondary | ICD-10-CM | POA: Insufficient documentation

## 2022-02-15 DIAGNOSIS — S8992XA Unspecified injury of left lower leg, initial encounter: Secondary | ICD-10-CM | POA: Diagnosis present

## 2022-02-15 MED ORDER — LIDOCAINE-EPINEPHRINE (PF) 2 %-1:200000 IJ SOLN
10.0000 mL | Freq: Once | INTRAMUSCULAR | Status: AC
  Administered 2022-02-15: 10 mL
  Filled 2022-02-15: qty 20

## 2022-02-15 NOTE — ED Notes (Signed)
Pt verbalizes understanding of discharge instructions. Opportunity for questioning and answers were provided. Pt discharged from ED to home with daughter.    

## 2022-02-15 NOTE — Discharge Instructions (Signed)
You can take the bandages off tomorrow after you finished with all your doctors visits.  If you are going to wear a compression sock or use the Ace wrap put a bandage over it if not just make sure you elevate it when you are sitting.  It has Steri-Strips and dissolvable stitches.  They should just peel off when they are ready.

## 2022-02-15 NOTE — ED Provider Notes (Signed)
[facility] EMERGENCY DEPT Provider Note   CSN: [id number] Arrival date & time: 02/15/22  1817     History {Add pertinent medical, surgical, social history, OB history to HPI:1} Chief Complaint  Patient presents with   Leg Pain    Steven Williams is a 88 y.o. male.  Patient is a 88 year old male with a history of rheumatoid arthritis, anemia, Paget's disease, atrial fibrillation just recently starting back on Coumadin, aortic aneurysm, antiphospholipid symptoms, CRI, COPD who is presenting today after injury to his left lower leg.  He was getting out of the car and shot the door too soon and caught the corner of his leg in it.  He has a laceration and ongoing bleeding but denies significant pain.  Tetanus shot is up-to-date.  The history is provided by the patient.  Leg Pain      Home Medications Prior to Admission medications   Medication Sig Start Date End Date Taking? Authorizing Provider  acetaminophen (TYLENOL) 650 MG CR tablet Take 1,300 mg by mouth in the morning.    [provider]  allopurinol (ZYLOPRIM) 100 MG tablet Take 100 mg by mouth daily.    [provider]  carvedilol (COREG) 6.25 MG tablet Take 6.25 mg by mouth 2 (two) times daily with a meal.    [provider]  Cholecalciferol (VITAMIN D3 PO) Take 1 capsule by mouth in the morning and at bedtime.    [provider]  Cholecalciferol (VITAMIN D3) 50 MCG (2000 UT) TABS Take 2,000 Units by mouth daily.    [provider]  dicyclomine (BENTYL) 10 MG capsule Take 30 mg by mouth in the morning and at bedtime.    [provider]  digoxin (LANOXIN) 0.125 MG tablet Take 0.125 mg by mouth every Monday, Wednesday, and Friday.    [provider]  Ferrous Sulfate (IRON) 325 (65 Fe) MG TABS Take 1 tablet by mouth 2 (two) times daily.    [provider]  furosemide (LASIX) 40 MG tablet Take 40 mg by mouth in the morning.    [provider]  gabapentin (NEURONTIN) 400 MG capsule Take 400 mg by mouth at bedtime.    [provider]  hydrocortisone (ANUSOL-HC) 2.5 % rectal cream Apply 1 application topically 2 (two) times daily as needed for hemorrhoids or anal itching. 04/15/21   [provider]  levothyroxine (SYNTHROID) 100 MCG tablet Take 100 mcg by mouth daily before breakfast. 06/04/20   [provider]  Multiple Vitamins-Minerals (PRESERVISION AREDS PO) Take 1 capsule by mouth 2 (two) times daily.    [provider]  mupirocin ointment (BACTROBAN) 2 % Apply 1 application. topically See admin instructions. Apply 1/2 inch strip to bilateral nares twice daily for 14 days and then as needed for crusting 12/29/21   [provider]  pantoprazole (PROTONIX) 40 MG tablet Take 40 mg by mouth daily before breakfast. 06/04/20   [provider]  Polyvinyl Alcohol-Povidone (REFRESH OP) Place 1 drop into both eyes 4 (four) times daily.    [provider]  pregabalin (LYRICA) 75 MG capsule Take 75 mg by mouth at bedtime.    [provider]  rOPINIRole (REQUIP) 2 MG tablet Take 2 mg by mouth at bedtime. 03/06/20   [provider]  saw palmetto 500 MG capsule Take 500 mg by mouth daily.    [provider]  sodium chloride (OCEAN) 0.65 % SOLN nasal spray Place 2 sprays into both nostrils every 4 (  four) hours while awake. 01/11/22 02/10/22  Darliss Cheney, MD  tamsulosin (FLOMAX) 0.4 MG CAPS capsule Take 0.4 mg by mouth daily.    [provider]  vitamin B-12 (CYANOCOBALAMIN) 1000 MCG tablet Take 1 tablet (1,000 mcg total) by mouth daily. 07/09/21   Orson Slick, MD  warfarin (COUMADIN) 2 MG tablet Take 2 mg by mouth at bedtime. 05/13/21   [provider]      Allergies    Iodinated contrast media    Review of Systems   Review of Systems  Physical Exam Updated Vital Signs BP 130/67   Pulse 66   Temp (!) 96.7 F (35.9 C)   Resp  17   SpO2 100%  Physical Exam Vitals and nursing note reviewed.  Cardiovascular:     Rate and Rhythm: Normal rate.  Pulmonary:     Effort: Pulmonary effort is normal.  Musculoskeletal:        General: Tenderness present.     Left lower leg: Laceration present.       Legs:     Comments: Skin changes consistent with chronic venous stasis on both lower extremities.  Mild edema bilaterally  Neurological:     Mental Status: He is alert.     ED Results / Procedures / Treatments   Labs (all labs ordered are listed, but only abnormal results are displayed) Labs Reviewed - No data to display  EKG None  Radiology No results found.  Procedures Procedures  LACERATION REPAIR Performed by: [employer] Authorized by: Blanchie Dessert Consent: Verbal consent obtained. Risks and benefits: risks, benefits and alternatives were discussed Consent given by: patient Patient identity confirmed: provided demographic data Prepped and Draped in normal sterile fashion Wound explored  Laceration Location: ***  Laceration Length: ***cm  No Foreign Bodies seen or palpated  Anesthesia: local infiltration  Local anesthetic: lidocaine ***% *** epinephrine  Anesthetic total: *** ml  Irrigation method: syringe Amount of cleaning: standard  Skin closure: ***  Number of sutures: ***  Technique: ***  Patient tolerance: Patient tolerated the procedure well with no immediate complications.  {Document cardiac monitor, telemetry assessment procedure when appropriate:1}  Medications Ordered in ED Medications  lidocaine-EPINEPHrine (XYLOCAINE W/EPI) 2 %-1:200000 (PF) injection 10 mL (has no administration in time range)    ED Course/ Medical Decision Making/ A&P                           Medical Decision Making Risk Prescription drug management.   Patient presenting today with a laceration of the left lower extremity after getting it caught in the car door.  Patient is taking  Coumadin again and is having mild bleeding but otherwise is well-appearing.  Wound repaired as above.  Tetanus shot is up-to-date.  {Document critical care time when appropriate:1} {Document review of labs and clinical decision tools ie heart score, Chads2Vasc2 etc:1}  {Document your independent review of radiology images, and any outside records:1} {Document your discussion with family members, caretakers, and with consultants:1} {Document social determinants of health affecting pt's care:1} {Document your decision making why or why not admission, treatments were needed:1} Final Clinical Impression(s) / ED Diagnoses Final diagnoses:  None    Rx / DC Orders ED Discharge Orders     None

## 2022-02-15 NOTE — ED Triage Notes (Signed)
Patient reports to the ER for left leg injury. Patient reports he shut the car door on his leg and has a large gash there. Patient reports he has been taking his coumadin.

## 2022-02-16 ENCOUNTER — Encounter (INDEPENDENT_AMBULATORY_CARE_PROVIDER_SITE_OTHER): Payer: Self-pay | Admitting: Ophthalmology

## 2022-02-16 ENCOUNTER — Ambulatory Visit (INDEPENDENT_AMBULATORY_CARE_PROVIDER_SITE_OTHER): Payer: Medicare Other | Admitting: Ophthalmology

## 2022-02-16 DIAGNOSIS — H353213 Exudative age-related macular degeneration, right eye, with inactive scar: Secondary | ICD-10-CM

## 2022-02-16 DIAGNOSIS — H35033 Hypertensive retinopathy, bilateral: Secondary | ICD-10-CM

## 2022-02-16 DIAGNOSIS — H04123 Dry eye syndrome of bilateral lacrimal glands: Secondary | ICD-10-CM

## 2022-02-16 DIAGNOSIS — H353221 Exudative age-related macular degeneration, left eye, with active choroidal neovascularization: Secondary | ICD-10-CM | POA: Diagnosis not present

## 2022-02-16 DIAGNOSIS — I10 Essential (primary) hypertension: Secondary | ICD-10-CM

## 2022-02-16 DIAGNOSIS — Z961 Presence of intraocular lens: Secondary | ICD-10-CM

## 2022-02-16 MED ORDER — AFLIBERCEPT 2MG/0.05ML IZ SOLN FOR KALEIDOSCOPE
2.0000 mg | INTRAVITREAL | Status: AC | PRN
  Administered 2022-02-16: 2 mg via INTRAVITREAL

## 2022-02-16 NOTE — Progress Notes (Addendum)
[name] Note  02/16/2022     CHIEF COMPLAINT Patient presents for Retina Follow Up  HISTORY OF PRESENT ILLNESS: Steven Williams is a 88 y.o. male who presents to the clinic today for:   HPI     Retina Follow Up   Patient presents with  Wet AMD.  In left eye.  Severity is moderate.  Duration of 11 weeks.  Since onset it is stable.  I, the attending physician,  performed the HPI with the patient and updated documentation appropriately.        Comments   Pt here for 11 wk ret f/u for exu ARMD OS. Pt states he thinks VA is the same, unsure. States he thinks he needs new rx specs, the ones he has are several years old.       Last edited by [name], MD on 02/16/2022  4:33 PM.     Referring physician:   HISTORICAL INFORMATION:   Selected notes from the MEDICAL RECORD NUMBER Referred by [name], [place]   CURRENT MEDICATIONS: Current Outpatient Medications (Ophthalmic Drugs)  Medication Sig   Polyvinyl Alcohol-Povidone (REFRESH OP) Place 1 drop into both eyes 4 (four) times daily.   No current facility-administered medications for this visit. (Ophthalmic Drugs)   Current Outpatient Medications (Other)  Medication Sig   acetaminophen (TYLENOL) 650 MG CR tablet Take 1,300 mg by mouth in the morning.   allopurinol (ZYLOPRIM) 100 MG tablet Take 100 mg by mouth daily.   carvedilol (COREG) 6.25 MG tablet Take 6.25 mg by mouth 2 (two) times daily with a meal.   Cholecalciferol (VITAMIN D3 PO) Take 1 capsule by mouth in the morning and at bedtime.   Cholecalciferol (VITAMIN D3) 50 MCG (2000 UT) TABS Take 2,000 Units by mouth daily.   dicyclomine (BENTYL) 10 MG capsule Take 30 mg by mouth in the morning and at bedtime.   digoxin (LANOXIN) 0.125 MG tablet Take 0.125 mg by mouth every Monday, Wednesday, and Friday.   furosemide (LASIX) 40 MG tablet Take 40 mg by mouth in the morning.   gabapentin (NEURONTIN) 400 MG capsule Take 400 mg by  mouth at bedtime.   hydrocortisone (ANUSOL-HC) 2.5 % rectal cream Apply 1 application topically 2 (two) times daily as needed for hemorrhoids or anal itching.   levothyroxine (SYNTHROID) 100 MCG tablet Take 100 mcg by mouth daily before breakfast.   Multiple Vitamins-Minerals (PRESERVISION AREDS PO) Take 1 capsule by mouth 2 (two) times daily.   mupirocin ointment (BACTROBAN) 2 % Apply 1 application. topically See admin instructions. Apply 1/2 inch strip to bilateral nares twice daily for 14 days and then as needed for crusting   pantoprazole (PROTONIX) 40 MG tablet Take 40 mg by mouth daily before breakfast.   pregabalin (LYRICA) 75 MG capsule Take 75 mg by mouth at bedtime.   rOPINIRole (REQUIP) 2 MG tablet Take 2 mg by mouth at bedtime.   saw palmetto 500 MG capsule Take 500 mg by mouth daily.   tamsulosin (FLOMAX) 0.4 MG CAPS capsule Take 0.4 mg by mouth daily.   vitamin B-12 (CYANOCOBALAMIN) 1000 MCG tablet Take 1 tablet (1,000 mcg total) by mouth daily.   warfarin (COUMADIN) 2 MG tablet Take 2 mg by mouth at bedtime.   Ferrous Sulfate (IRON) 325 (65 Fe) MG TABS Take 1 tablet by mouth 2 (two) times daily. (Patient not taking: Reported on 02/16/2022)   sodium chloride (OCEAN) 0.65 % SOLN nasal spray Place  2 sprays into both nostrils every 4 (four) hours while awake.   Current Facility-Administered Medications (Other)  Medication Route   Bevacizumab (AVASTIN) SOLN 1.25 mg Intravitreal   diphenhydrAMINE (BENADRYL) capsule 50 mg Oral   REVIEW OF SYSTEMS: ROS   Positive for: Gastrointestinal, Genitourinary, Musculoskeletal, Cardiovascular, Eyes, Respiratory Negative for: Constitutional, Neurological, Skin, HENT, Endocrine, Psychiatric, Allergic/Imm, Heme/Lymph Last edited by [name], COT on 02/16/2022  2:43 PM.     ALLERGIES Allergies  Allergen Reactions   Iodinated Contrast Media Rash   PAST MEDICAL HISTORY Past Medical History:  Diagnosis Date   Antiphospholipid  antibody positive    Aortic aneurysm (HCC)    thoracic, Dr Roxan Hockey, with h/o dissection   BPH (benign prostatic hyperplasia)    Central retinal artery occlusion    Chronic anemia    Chronic renal insufficiency    COPD (chronic obstructive pulmonary disease) (HCC)    DDD (degenerative disc disease), cervical    DJD (degenerative joint disease)    DVT (deep venous thrombosis) (HCC)    Gastric dysmotility    GERD (gastroesophageal reflux disease)    Gout    HTN (hypertension)    Hyperlipidemia    Hypertensive retinopathy    OU   Hypothyroid    Macular degeneration    Myelodysplasia (myelodysplastic syndrome) (HCC)    Paget's disease of bone    Peripheral neuropathy    Permanent atrial fibrillation (HCC)    Recurrent epistaxis 06/26/2020   Rheumatoid arthritis (HCC)    TIA (transient ischemic attack)    Past Surgical History:  Procedure Laterality Date   ATRIAL FIBRILLATION ABLATION  09/23/2014   Dr Encarnacion Chu in [place]     lumbar, x 2   CAROTID ENDARTERECTOMY Right 1990   CAROTID STENT     CATARACT EXTRACTION Bilateral 2010   [place]   EYE SURGERY Bilateral 2010   [facility]   LUNG DECORTICATION     VATS   PACEMAKER GENERATOR CHANGE  12/03/2011   [facility]T Adapta ADDR01 generator change by Dr Encarnacion Chu for symptomatic bradycardia   PACEMAKER IMPLANT  2004   MDT    FAMILY HISTORY Family History  Problem Relation Age of Onset   Cirrhosis Mother    Stroke Father    Lung cancer Sister    Lung cancer Brother    SOCIAL HISTORY Social History   Tobacco Use   Smoking status: Former    Packs/day: 2.00    Years: 12.00    Total pack years: 24.00    Types: Cigarettes    Quit date: 09/07/1962    Years since quitting: 59.4   Smokeless tobacco: Never  Vaping Use   Vaping Use: Never used  Substance Use Topics   Alcohol use: Yes    Alcohol/week: 2.0 standard drinks of alcohol    Types: 2 Glasses of wine per week    Comment: daily   Drug use: Never        OPHTHALMIC EXAM: Base Eye Exam     Visual Acuity (Snellen - Linear)       Right Left   Dist cc CF at 3' 20/60 -2   Dist ph cc NI NI         Tonometry (Tonopen, 2:51 PM)       Right Left   Pressure 10 11         Pupils       Dark Light Shape React APD   Right 2 1 Round Minimal  None   Left 2 1 Round Minimal None         Visual Fields       Left Right     Full   Restrictions Partial outer inferior temporal deficiency          Extraocular Movement       Right Left    Full, Ortho Full, Ortho         Neuro/Psych     Oriented x3: Yes   Mood/Affect: Normal         Dilation     Both eyes: 1.0% Mydriacyl, 2.5% Phenylephrine @ 2:52 PM           Slit Lamp and Fundus Exam     Slit Lamp Exam       Right Left   Lids/Lashes Dermatochalasis - upper lid, Meibomian gland dysfunction Dermatochalasis - upper lid, Meibomian gland dysfunction   Conjunctiva/Sclera White and quiet White and quiet, +mucus   Cornea Arcus, 2-3+inferior Punctate epithelial erosions, Well healed cataract wounds Arcus, 2-3+inferior Punctate epithelial erosions centrally, Well healed cataract wounds, mild endo pigment   Anterior Chamber Deep and quiet Deep and quiet   Iris Round and moderately dilated Round and moderately dilated   Lens Posterior chamber intraocular lens, 1+ non-central Posterior capsular opacification Posterior chamber intraocular lens, open PC   Anterior Vitreous Vitreous syneresis, Posterior vitreous detachment Vitreous syneresis         Fundus Exam       Right Left   Disc +cupping, trace pallor, thin superior rim, 360 PPA mild pallor, 360 PPA, +cupping   C/D Ratio 0.7 0.7   Macula Flat, diffuse RPE atrophy, central pigment clumping, No heme or edema Flat, Blunted foveal reflex, Drusen, RPE mottling, clumping and atrophy, No edema, trace cystic changes -- stably improved, peripapillary GA nasal macula, no heme   Vessels Vascular attenuation Vascular  attenuation, Tortuous   Periphery Attached, mild reticular degeneration, mild paving stone inferiorly Attached, mild reticular degeneration           Refraction     Wearing Rx       Sphere Cylinder Axis Add   Right -1.75 +2.25 005 +2.50   Left -1.50 +2.50 175 +2.50    Type: PAL           IMAGING AND PROCEDURES  Imaging and Procedures for _0 @  OCT, Retina - OU - Both Eyes       Right Eye Quality was borderline. Central Foveal Thickness: 667. Progression has been stable. Findings include no IRF, no SRF, abnormal foveal contour, retinal drusen , subretinal hyper-reflective material, pigment epithelial detachment, inner retinal atrophy, outer retinal atrophy.   Left Eye Quality was good. Central Foveal Thickness: 253. Progression has been stable. Findings include no IRF, no SRF, abnormal foveal contour, retinal drusen , subretinal hyper-reflective material, pigment epithelial detachment, outer retinal atrophy (Stable improvement in cystic changes, +GA ).   Notes *Images captured and stored on drive  Diagnosis / Impression:  Exudative ARMD OU OD: +atrophic macular scar - stable OS: Stable improvement in cystic changes, +GA   Clinical management:  See below  Abbreviations: NFP - Normal foveal profile. CME - cystoid macular edema. PED - pigment epithelial detachment. IRF - intraretinal fluid. SRF - subretinal fluid. EZ - ellipsoid zone. ERM - epiretinal membrane. ORA - outer retinal atrophy. ORT - outer retinal tubulation. SRHM - subretinal hyper-reflective material      Intravitreal Injection, Pharmacologic Agent - OS - Left Eye  Time Out 02/16/2022. 4:09 PM. Confirmed correct patient, procedure, site, and patient consented.   Anesthesia Topical anesthesia was used. Anesthetic medications included Lidocaine 2%, Proparacaine 0.5%.   Procedure Preparation included 5% betadine to ocular surface, eyelid speculum. A (32g) needle was used.   Injection: 2 mg  aflibercept 2 MG/0.05ML   Route: Intravitreal, Site: Left Eye   NDC: A3590391, Lot: [id number], Expiration date: 12/05/2022, Waste: 0 mL   Post-op Post injection exam found visual acuity of at least counting fingers. The patient tolerated the procedure well. There were no complications. The patient received written and verbal post procedure care education. Post injection medications were not given.            ASSESSMENT/PLAN:    ICD-10-CM   1. Exudative age-related macular degeneration of left eye with active choroidal neovascularization (HCC)  H35.3221 OCT, Retina - OU - Both Eyes    Intravitreal Injection, Pharmacologic Agent - OS - Left Eye    aflibercept (EYLEA) SOLN 2 mg    2. Exudative age-related macular degeneration of right eye with inactive scar ([place])  H35.3213     3. Essential hypertension  I10     4. Hypertensive retinopathy of both eyes  H35.033     5. Pseudophakia of both eyes  Z96.1     6. Dry eyes  H04.123      1,2. 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    OD- w/ inactive scar -- history of low vision, CF 3'  OS- w/ CNV  - history of multiple IVL OS in [place], [place] w/ Dr. Doristine Church -- last injection 06/2018, q4-6 wk interval  - moved to [place] in November 2019 and was followed by Dr. Baird Cancer who had recommended observation, thus pt sought second opinion  - given functional monocular status and history of active CNV, has been receiving maintenance injections, s/p IVA #1 OS (02.25.20), #2 (04.07.20), #3 (05.05.20), #4 (06.22.20), #5 (08.03.20), #6 (03.22.21)  - s/p IVE OS #1 (09.14.20), #2 (10.23.20), #3 (11.30.20), #4 (01.11.21), #5 (02.15.21), #6 (03.22.21), #7 (05.03.21), #8 (06.16.21), #9 (07.26.21), #10 (09.07.21), #11 (10.19.21), #12 (12.01.2021), #13 (01.19.22), #14 (03.09.22), #15 (05.12.22), #16 (07.08.22), #17 (09.07.22), #18 (11.09.22), #19 (01.11.23), #20 (03.22.23)  - OCT shows stable improvement in cystic changes, persistent GA; OD  with inactive scar at 11 weeks  - BCVA improved from 20/70 to 20/60 -- mostly dry eyes  - recommend IVE OS #21 (06.12.23) w/ ext f/u to 12 wks  - Eylea4U benefits investigation started 9.14.20 -- approved for 2023 - RBA of procedure discussed, questions answered - informed consent obtained and re-signed 06.12.23 - see procedure note   - f/u 12 weeks -- DFE, OCT, likely injection  3,4. Hypertensive retinopathy OU  - discussed importance of tight BP control  - monitor   5. Pseudophakia OU  - s/p CE/IOL OU  - monitor   6. Dry eyes OU  - 2-3+ PEE OU (OS > OD)  - pt using AT 4-5x a day OU + lubricating ointment at night  - BCVA improved back to baseline OS -- 20/50 - recommend inc artificial tears to 5-6x/day - cont lubricating ointment at night -- pt states Refresh PM blurred vision through to 11 am - noon the following day - monitor    Ophthalmic Meds Ordered this visit:  Meds ordered this encounter  Medications   aflibercept (EYLEA) SOLN 2 mg     Return in about 12 weeks (around 05/11/2022) for DFE, OCT, possible  injection.  There are no Patient Instructions on file for this visit.  This document serves as a record of services personally performed by Gardiner Sleeper, MD, PhD. It was created on their behalf by Orvan Falconer, an ophthalmic technician. The creation of this record is the provider's dictation and/or activities during the visit.    Electronically signed by: Orvan Falconer, OA, 02/16/22  4:38 PM  This document serves as a record of services personally performed by Gardiner Sleeper, MD, PhD. It was created on their behalf by Leonie Douglas, an ophthalmic technician. The creation of this record is the provider's dictation and/or activities during the visit.    Electronically signed by: Leonie Douglas COA, 02/16/22  4:38 PM   Gardiner Sleeper, M.D., Ph.D. Diseases & Surgery of the Retina and Vitreous [facility]  I have reviewed the above  documentation for accuracy and completeness, and I agree with the above. Gardiner Sleeper, M.D., Ph.D. 11/29/21 4:38 PM   Abbreviations: M myopia (nearsighted); A astigmatism; H hyperopia (farsighted); P presbyopia; Mrx spectacle prescription;  CTL contact lenses; OD right eye; OS left eye; OU both eyes  XT exotropia; ET esotropia; PEK punctate epithelial keratitis; PEE punctate epithelial erosions; DES dry eye syndrome; MGD meibomian gland dysfunction; ATs artificial tears; PFAT's preservative free artificial tears; [place] nuclear sclerotic cataract; PSC posterior subcapsular cataract; ERM epi-retinal membrane; PVD posterior vitreous detachment; RD retinal detachment; DM diabetes mellitus; DR diabetic retinopathy; NPDR non-proliferative diabetic retinopathy; PDR proliferative diabetic retinopathy; CSME clinically significant macular edema; DME diabetic macular edema; dbh dot blot hemorrhages; CWS cotton wool spot; POAG primary open angle glaucoma; C/D cup-to-disc ratio; HVF humphrey visual field; GVF goldmann visual field; OCT optical coherence tomography; IOP intraocular pressure; BRVO Branch retinal vein occlusion; CRVO central retinal vein occlusion; CRAO central retinal artery occlusion; BRAO branch retinal artery occlusion; RT retinal tear; SB scleral buckle; PPV pars plana vitrectomy; VH Vitreous hemorrhage; PRP panretinal laser photocoagulation; IVK intravitreal kenalog; VMT vitreomacular traction; MH Macular hole;  NVD neovascularization of the disc; NVE neovascularization elsewhere; AREDS age related eye disease study; ARMD age related macular degeneration; POAG primary open angle glaucoma; EBMD epithelial/anterior basement membrane dystrophy; ACIOL anterior chamber intraocular lens; IOL intraocular lens; PCIOL posterior chamber intraocular lens; Phaco/IOL phacoemulsification with intraocular lens placement; [place] photorefractive keratectomy; LASIK laser assisted in situ keratomileusis; HTN hypertension; DM  diabetes mellitus; COPD chronic obstructive pulmonary disease

## 2022-02-18 ENCOUNTER — Inpatient Hospital Stay: Payer: Medicare Other | Admitting: Hematology and Oncology

## 2022-02-18 ENCOUNTER — Other Ambulatory Visit: Payer: Self-pay | Admitting: Diagnostic Neuroimaging

## 2022-02-18 ENCOUNTER — Other Ambulatory Visit: Payer: Self-pay | Admitting: Hematology and Oncology

## 2022-02-18 ENCOUNTER — Inpatient Hospital Stay: Payer: Medicare Other | Attending: Hematology and Oncology

## 2022-02-18 DIAGNOSIS — D649 Anemia, unspecified: Secondary | ICD-10-CM

## 2022-02-19 ENCOUNTER — Other Ambulatory Visit: Payer: Self-pay | Admitting: Diagnostic Neuroimaging

## 2022-02-19 ENCOUNTER — Other Ambulatory Visit: Payer: Self-pay | Admitting: *Deleted

## 2022-02-19 MED ORDER — ROPINIROLE HCL 1 MG PO TABS: 2.0000 mg | ORAL_TABLET | Freq: Every day | ORAL | 5 refills | Status: DC

## 2022-02-19 NOTE — Telephone Encounter (Signed)
Called pharmacy to clarify ropinirole Rx, spoke with Ronalee Belts and reviewed Dr Gladstone Lighter note re: ropinirole dosing per last office note. Ronalee Belts stated the patient picked up Rx for 2 mg tabs yesterday from PCP with directions:  take 1/2 tab at night, # 45 Tabs, 0 refills. I advised Ronalee Belts to put hold on Rx we sent today. Ronalee Belts verbalized understanding, appreciation.

## 2022-02-19 NOTE — Addendum Note (Signed)
Addended by: Minna Antis on: 02/19/2022 07:42 AM   Modules accepted: Orders

## 2022-03-25 ENCOUNTER — Encounter: Payer: Self-pay | Admitting: Student

## 2022-03-25 ENCOUNTER — Ambulatory Visit (INDEPENDENT_AMBULATORY_CARE_PROVIDER_SITE_OTHER): Payer: Medicare Other

## 2022-03-25 ENCOUNTER — Ambulatory Visit (INDEPENDENT_AMBULATORY_CARE_PROVIDER_SITE_OTHER): Payer: Medicare Other | Admitting: Student

## 2022-03-25 VITALS — BP 109/64 | HR 64 | Resp 16 | Ht 72.0 in | Wt 184.0 lb

## 2022-03-25 DIAGNOSIS — I5032 Chronic diastolic (congestive) heart failure: Secondary | ICD-10-CM

## 2022-03-25 DIAGNOSIS — G459 Transient cerebral ischemic attack, unspecified: Secondary | ICD-10-CM

## 2022-03-25 DIAGNOSIS — I10 Essential (primary) hypertension: Secondary | ICD-10-CM | POA: Diagnosis not present

## 2022-03-25 DIAGNOSIS — I4821 Permanent atrial fibrillation: Secondary | ICD-10-CM

## 2022-03-25 DIAGNOSIS — R001 Bradycardia, unspecified: Secondary | ICD-10-CM

## 2022-03-25 DIAGNOSIS — I340 Nonrheumatic mitral (valve) insufficiency: Secondary | ICD-10-CM

## 2022-03-25 LAB — CUP PACEART INCLINIC DEVICE CHECK
Battery Impedance: 3330 Ohm
Battery Remaining Longevity: 21 mo
Battery Voltage: 2.71 V
Brady Statistic RV Percent Paced: 24 %
Date Time Interrogation Session: 20230719122659
Implantable Lead Implant Date: 20041008
Implantable Lead Implant Date: 20041008
Implantable Lead Location: 753859
Implantable Lead Location: 753860
Implantable Lead Model: 5076
Implantable Lead Model: 5076
Implantable Pulse Generator Implant Date: 20130328
Lead Channel Impedance Value: 67 Ohm
Lead Channel Impedance Value: 890 Ohm
Lead Channel Pacing Threshold Amplitude: 1.25 V
Lead Channel Pacing Threshold Pulse Width: 1 ms
Lead Channel Sensing Intrinsic Amplitude: 8 mV
Lead Channel Setting Pacing Amplitude: 2.5 V
Lead Channel Setting Pacing Pulse Width: 1 ms
Lead Channel Setting Sensing Sensitivity: 2.8 mV

## 2022-03-25 NOTE — Progress Notes (Signed)
Electrophysiology Office Note Date: 03/25/2022  ID:  Steven Williams, DOB 1930/04/22, MRN [id number]  PCP: Ginger Organ., MD Primary Cardiologist: None Electrophysiologist: Thompson Grayer, MD   CC: Pacemaker follow-up  Steven Williams is a 88 y.o. male seen today for Thompson Grayer, MD for routine electrophysiology followup.  Since last being seen in our clinic the patient reports doing OK. He has continued to have very frequent nosebleeds and it has been recommended he stop his coumadin. Recently, he states he has not stopped it completely but has only been taking it every couple of days.  he denies chest pain, palpitations, dyspnea, PND, orthopnea, nausea, vomiting, dizziness, syncope, edema, weight gain, or early satiety.  Device History: Medtronic Dual Chamber PPM implanted 11/2011 for symptomatic bradycardia  Past Medical History:  Diagnosis Date   Antiphospholipid antibody positive    Aortic aneurysm (HCC)    thoracic, Dr Roxan Hockey, with h/o dissection   BPH (benign prostatic hyperplasia)    Central retinal artery occlusion    Chronic anemia    Chronic renal insufficiency    COPD (chronic obstructive pulmonary disease) (HCC)    DDD (degenerative disc disease), cervical    DJD (degenerative joint disease)    DVT (deep venous thrombosis) (HCC)    Gastric dysmotility    GERD (gastroesophageal reflux disease)    Gout    HTN (hypertension)    Hyperlipidemia    Hypertensive retinopathy    OU   Hypothyroid    Macular degeneration    Myelodysplasia (myelodysplastic syndrome) (HCC)    Paget's disease of bone    Peripheral neuropathy    Permanent atrial fibrillation (HCC)    Recurrent epistaxis 06/26/2020   Rheumatoid arthritis ([place])    TIA (transient ischemic attack)    Past Surgical History:  Procedure Laterality Date   ATRIAL FIBRILLATION ABLATION  09/23/2014   Dr Encarnacion Chu in [place]     lumbar, x 2   CAROTID ENDARTERECTOMY Right 1990   CAROTID STENT      CATARACT EXTRACTION Bilateral 2010   [place]   EYE SURGERY Bilateral 2010   [facility]   LUNG DECORTICATION     VATS   PACEMAKER GENERATOR CHANGE  12/03/2011   [facility] Adapta ADDR01 generator change by Dr Encarnacion Chu for symptomatic bradycardia   PACEMAKER IMPLANT  2004   MDT     Current Outpatient Medications  Medication Sig Dispense Refill   acetaminophen (TYLENOL) 650 MG CR tablet Take 1,300 mg by mouth in the morning.     allopurinol (ZYLOPRIM) 100 MG tablet Take 100 mg by mouth daily.     carvedilol (COREG) 6.25 MG tablet Take 6.25 mg by mouth 2 (two) times daily with a meal.     Cholecalciferol (VITAMIN D3 PO) Take 1 capsule by mouth in the morning and at bedtime.     Cholecalciferol (VITAMIN D3) 50 MCG (2000 UT) TABS Take 2,000 Units by mouth daily.     dicyclomine (BENTYL) 10 MG capsule Take 30 mg by mouth in the morning and at bedtime.     digoxin (LANOXIN) 0.125 MG tablet Take 0.125 mg by mouth every Monday, Wednesday, and Friday.     Ferrous Sulfate (IRON) 325 (65 Fe) MG TABS Take 1 tablet by mouth 2 (two) times daily.     furosemide (LASIX) 40 MG tablet Take 40 mg by mouth in the morning.     gabapentin (NEURONTIN) 400 MG capsule Take 400 mg by mouth at bedtime.  hydrocortisone (ANUSOL-HC) 2.5 % rectal cream Apply 1 application topically 2 (two) times daily as needed for hemorrhoids or anal itching.     levothyroxine (SYNTHROID) 100 MCG tablet Take 100 mcg by mouth daily before breakfast.     Multiple Vitamins-Minerals (PRESERVISION AREDS PO) Take 1 capsule by mouth 2 (two) times daily.     mupirocin ointment (BACTROBAN) 2 % Apply 1 application. topically See admin instructions. Apply 1/2 inch strip to bilateral nares twice daily for 14 days and then as needed for crusting     pantoprazole (PROTONIX) 40 MG tablet Take 40 mg by mouth daily before breakfast.     Polyvinyl Alcohol-Povidone (REFRESH OP) Place 1 drop into both eyes 4 (four) times daily.     pregabalin (LYRICA) 75 MG  capsule Take 75 mg by mouth at bedtime.     rOPINIRole (REQUIP) 1 MG tablet Take 2 tablets (2 mg total) by mouth at bedtime. 60 tablet 5   saw palmetto 500 MG capsule Take 500 mg by mouth daily.     tamsulosin (FLOMAX) 0.4 MG CAPS capsule Take 0.4 mg by mouth daily.     vitamin B-12 (CYANOCOBALAMIN) 1000 MCG tablet Take 1 tablet (1,000 mcg total) by mouth daily. 30 tablet 3   warfarin (COUMADIN) 2 MG tablet Take 2 mg by mouth at bedtime.     sodium chloride (OCEAN) 0.65 % SOLN nasal spray Place 2 sprays into both nostrils every 4 (four) hours while awake. 15 mL 0   Current Facility-Administered Medications  Medication Dose Route Frequency Provider Last Rate Last Admin   Bevacizumab (AVASTIN) SOLN 1.25 mg  1.25 mg Intravitreal  [name], MD   1.25 mg at 11/01/18 2343   diphenhydrAMINE (BENADRYL) capsule 50 mg  50 mg Oral Once Logan Bores, MD        Allergies:   Iodinated contrast media   Social History: Social History   Socioeconomic History   Marital status: Widowed    Spouse name: Not on file   Number of children: 3   Years of education: Not on file   Highest education level: Bachelor's degree (e.g., BA, AB, BS)  Occupational History   Occupation: retired    Comment: retired [employer]  Tobacco Use   Smoking status: Former    Packs/day: 2.00    Years: 12.00    Total pack years: 24.00    Types: Cigarettes    Quit date: 09/07/1962    Years since quitting: 59.5   Smokeless tobacco: Never  Vaping Use   Vaping Use: Never used  Substance and Sexual Activity   Alcohol use: Yes    Alcohol/week: 2.0 standard drinks of alcohol    Types: 2 Glasses of wine per week    Comment: daily   Drug use: Never   Sexual activity: Not Currently  Other Topics Concern   Not on file  Social History Narrative   Previously lived in [place]   03/12/20 Now lives in [place]   Retired Chief Financial Officer   Caffeine 2 a day   Social Determinants of Radio broadcast assistant Strain: Not  on file  Food Insecurity: Not on file  Transportation Needs: Not on file  Physical Activity: Not on file  Stress: Not on file  Social Connections: Not on file  Intimate Partner Violence: Not on file    Family History: Family History  Problem Relation Age of Onset   Cirrhosis Mother    Stroke Father    Lung cancer  Sister    Lung cancer Brother      Review of Systems: All other systems reviewed and are otherwise negative except as noted above.  Physical Exam: Vitals:   03/25/22 1154  BP: 109/64  Pulse: 64  Resp: 16  SpO2: 98%  Weight: 184 lb (83.5 kg)  Height: 6' (1.829 m)     GEN- The patient is well appearing, alert and oriented x 3 today.   HEENT: normocephalic, atraumatic; sclera clear, conjunctiva pink; hearing intact; oropharynx clear; neck supple  Lungs- Clear to ausculation bilaterally, normal work of breathing.  No wheezes, rales, rhonchi Heart- Regular rate and rhythm, no murmurs, rubs or gallops  GI- soft, non-tender, non-distended, bowel sounds present  Extremities- no clubbing or cyanosis. No edema MS- no significant deformity or atrophy Skin- warm and dry, no rash or lesion; PPM pocket well healed Psych- euthymic mood, full affect Neuro- strength and sensation are intact  PPM Interrogation- reviewed in detail today,  See PACEART report  EKG:  EKG is not ordered today.  Recent Labs: 06/23/2021: TSH 3.876 11/17/2021: ALT 14 01/10/2022: BUN 49; Creatinine, Ser 2.25; Potassium 3.6; Sodium 142 01/11/2022: Hemoglobin 9.8; Platelets 78   Wt Readings from Last 3 Encounters:  03/25/22 184 lb (83.5 kg)  01/09/22 180 lb 1.9 oz (81.7 kg)  12/01/21 194 lb (88 kg)     Other studies Reviewed: Additional studies/ records that were reviewed today include: Previous EP office notes, Previous remote checks, Most recent labwork.   Assessment and Plan:  1. Symptomatic bradycardia s/p Medtronic PPM  Normal PPM function See Pace Art report No changes today  2.  Permanent Afib Rates overall well controlled.  Continue coumadin for CHA2DS2VASC of at least 6   Continue digoxin.    3. HTN Stable on current regimen    4. High risk drug monitoring Digoxin level today.   5. Chronic diastolic CHF LVEF 00-93% 04/1828 Volume status OK on exam today.  Continue lasix 40 mg daily at this time. We have discussed sliding scale diuretics, sodium restriction, fluid restriction. Previously tried lasix 40 mg every other day which was ineffective.  He should take 20 mg lasix EXTRA as needed for weight gain of 3 lbs overnight, or 5 lbs within one week.  Labs today.    6. Epistaxis 7. Antiphospholipid antibody positive Initially improved off eliquis, but now frequently recurring with coumadin Pt has only been taking coumadin every few days, and we discussed that this is ineffective.  He understands that his CVA risk is at least 9.8% per year by CHA2DS2VASc, possibly higher with antiphospholipid.    At this juncture, he would like to hold coumadin for several months and see how his nosebleeds do. We will follow him up in this time frame to assess and re-discuss.  Current medicines are reviewed at length with the patient today.    Labs/ tests ordered today include:  Orders Placed This Encounter  Procedures   Basic metabolic panel   CBC   Digoxin level   CUP PACEART INCLINIC DEVICE CHECK    Disposition:   Follow up with Dr. Curt Bears in 3 months to establish from Dr. Rayann Heman and further discuss recommendations for [facility].    Jacalyn Lefevre, PA-C  03/25/2022 12:31 PM  [place] [place] [id number] [phone] (office) [phone] (fax)

## 2022-03-25 NOTE — Patient Instructions (Addendum)
Medication Instructions:  Your physician has recommended you make the following change in your medication:   DISCONTINUE: Warfarin  *If you need a refill on your cardiac medications before your next appointment, please call your pharmacy*   Lab Work: TODAY: BMET, CBC, Dig level  If you have labs (blood work) drawn today and your tests are completely normal, you will receive your results only by: [place] (if you have MyChart) OR A paper copy in the mail If you have any lab test that is abnormal or we need to change your treatment, we will call you to review the results.   Follow-Up: At [facility], you and your health needs are our priority.  As part of our continuing mission to provide you with exceptional heart care, we have created designated Provider Care Teams.  These Care Teams include your primary Cardiologist (physician) and Advanced Practice Providers (APPs -  Physician Assistants and Nurse Practitioners) who all work together to provide you with the care you need, when you need it.  We recommend signing up for the patient portal called "MyChart".  Sign up information is provided on this After Visit Summary.  MyChart is used to connect with patients for Virtual Visits (Telemedicine).  Patients are able to view lab/test results, encounter notes, upcoming appointments, etc.  Non-urgent messages can be sent to your provider as well.   To learn more about what you can do with MyChart, go to [email or web address].    Your next appointment:   3 month(s)  The format for your next appointment:   In Person  Provider:   Lars Mage, MD

## 2022-03-26 LAB — BASIC METABOLIC PANEL
BUN/Creatinine Ratio: 19 (ref 10–24)
BUN: 48 mg/dL — ABNORMAL HIGH (ref 10–36)
CO2: 20 mmol/L (ref 20–29)
Calcium: 10.1 mg/dL (ref 8.6–10.2)
Chloride: 102 mmol/L (ref 96–106)
Creatinine, Ser: 2.59 mg/dL — ABNORMAL HIGH (ref 0.76–1.27)
Glucose: 102 mg/dL — ABNORMAL HIGH (ref 70–99)
Potassium: 4.2 mmol/L (ref 3.5–5.2)
Sodium: 140 mmol/L (ref 134–144)
eGFR: 23 mL/min/{1.73_m2} — ABNORMAL LOW (ref >59)

## 2022-03-26 LAB — CUP PACEART REMOTE DEVICE CHECK
Battery Impedance: 3287 Ohm
Battery Remaining Longevity: 22 mo
Battery Voltage: 2.71 V
Brady Statistic RV Percent Paced: 24 %
Date Time Interrogation Session: 20230719122805
Implantable Lead Implant Date: 20041008
Implantable Lead Implant Date: 20041008
Implantable Lead Location: 753859
Implantable Lead Location: 753860
Implantable Lead Model: 5076
Implantable Lead Model: 5076
Implantable Pulse Generator Implant Date: 20130328
Lead Channel Impedance Value: 67 Ohm
Lead Channel Impedance Value: 925 Ohm
Lead Channel Setting Pacing Amplitude: 2.5 V
Lead Channel Setting Pacing Pulse Width: 1 ms
Lead Channel Setting Sensing Sensitivity: 2.8 mV

## 2022-03-26 LAB — CBC
Hematocrit: 29.9 % — ABNORMAL LOW (ref 37.5–51.0)
Hemoglobin: 10.1 g/dL — ABNORMAL LOW (ref 13.0–17.7)
MCH: 33.3 pg — ABNORMAL HIGH (ref 26.6–33.0)
MCHC: 33.8 g/dL (ref 31.5–35.7)
MCV: 99 fL — ABNORMAL HIGH (ref 79–97)
Platelets: 109 10*3/uL — ABNORMAL LOW (ref 150–450)
RBC: 3.03 x10E6/uL — ABNORMAL LOW (ref 4.14–5.80)
RDW: 12.8 % (ref 11.6–15.4)
WBC: 5.4 10*3/uL (ref 3.4–10.8)

## 2022-03-26 LAB — DIGOXIN LEVEL: Digoxin, Serum: 0.6 ng/mL (ref 0.5–0.9)

## 2022-04-10 ENCOUNTER — Other Ambulatory Visit: Payer: Self-pay | Admitting: Thoracic Surgery (Cardiothoracic Vascular Surgery)

## 2022-04-10 DIAGNOSIS — I712 Thoracic aortic aneurysm, without rupture, unspecified: Secondary | ICD-10-CM

## 2022-04-15 ENCOUNTER — Ambulatory Visit (INDEPENDENT_AMBULATORY_CARE_PROVIDER_SITE_OTHER): Payer: Medicare Other | Admitting: Podiatry

## 2022-04-15 DIAGNOSIS — Z91199 Patient's noncompliance with other medical treatment and regimen due to unspecified reason: Secondary | ICD-10-CM

## 2022-04-15 NOTE — Progress Notes (Signed)
   Complete physical exam  Patient: Steven Williams   DOB: 06/27/1997   88 y.o. Male  MRN: [id number]  Subjective:    No chief complaint on file.   Steven Williams is a 88 y.o. male who presents today for a complete physical exam. She reports consuming a {diet types:17450} diet. {types:19826} She generally feels {DESC; WELL/FAIRLY WELL/POORLY:18703}. She reports sleeping {DESC; WELL/FAIRLY WELL/POORLY:18703}. She {does/does not:200015} have additional problems to discuss today.    Most recent fall risk assessment:    03/04/2022   10:42 AM  Fall Risk   Falls in the past year? 0  Number falls in past yr: 0  Injury with Fall? 0  Risk for fall due to : No Fall Risks  Follow up Falls evaluation completed     Most recent depression screenings:    03/04/2022   10:42 AM 01/23/2021   10:46 AM  PHQ 2/9 Scores  PHQ - 2 Score 0 0  PHQ- 9 Score 5     {VISON DENTAL STD PSA (Optional):27386}  {History (Optional):23778}  Patient Care Team: Jessup, Joy, NP as PCP - General (Nurse Practitioner)   Outpatient Medications Prior to Visit  Medication Sig   fluticasone (FLONASE) 50 MCG/ACT nasal spray Place 2 sprays into both nostrils in the morning and at bedtime. After 7 days, reduce to once daily.   norgestimate-ethinyl estradiol (SPRINTEC 28) 0.25-35 MG-MCG tablet Take 1 tablet by mouth daily.   Nystatin POWD Apply liberally to affected area 2 times per day   spironolactone (ALDACTONE) 100 MG tablet Take 1 tablet (100 mg total) by mouth daily.   No facility-administered medications prior to visit.    ROS        Objective:     There were no vitals taken for this visit. {Vitals History (Optional):23777}  Physical Exam   No results found for any visits on 04/09/22. {Show previous labs (optional):23779}    Assessment & Plan:    Routine Health Maintenance and Physical Exam  Immunization History  Administered Date(s) Administered   DTaP 09/10/1999, 11/06/1999,  01/15/2000, 09/30/2000, 04/15/2004   Hepatitis A 02/10/2008, 02/15/2009   Hepatitis B 06/28/1999, 08/05/1999, 01/15/2000   HiB (PRP-OMP) 09/10/1999, 11/06/1999, 01/15/2000, 09/30/2000   IPV 09/10/1999, 11/06/1999, 07/05/2000, 04/15/2004   Influenza,inj,Quad PF,6+ Mos 05/18/2014   Influenza-Unspecified 08/17/2012   MMR 07/05/2001, 04/15/2004   Meningococcal Polysaccharide 02/15/2012   Pneumococcal Conjugate-13 09/30/2000   Pneumococcal-Unspecified 01/15/2000, 03/30/2000   Tdap 02/15/2012   Varicella 07/05/2000, 02/10/2008    Health Maintenance  Topic Date Due   HIV Screening  Never done   Hepatitis C Screening  Never done   INFLUENZA VACCINE  04/07/2022   PAP-Cervical Cytology Screening  04/09/2022 (Originally 06/26/2020)   PAP SMEAR-Modifier  04/09/2022 (Originally 06/26/2020)   TETANUS/TDAP  04/09/2022 (Originally 02/14/2022)   HPV VACCINES  Discontinued   COVID-19 Vaccine  Discontinued    Discussed health benefits of physical activity, and encouraged her to engage in regular exercise appropriate for her age and condition.  Problem List Items Addressed This Visit   None Visit Diagnoses     Annual physical exam    -  Primary   Cervical cancer screening       Need for Tdap vaccination          No follow-ups on file.     Joy Jessup, NP   

## 2022-04-18 ENCOUNTER — Encounter (HOSPITAL_COMMUNITY): Payer: Self-pay

## 2022-04-18 ENCOUNTER — Other Ambulatory Visit: Payer: Self-pay

## 2022-04-18 ENCOUNTER — Inpatient Hospital Stay (HOSPITAL_COMMUNITY)
Admission: EM | Admit: 2022-04-18 | Discharge: 2022-04-27 | DRG: 300 | Disposition: A | Discharge status: Skilled Nursing Facility | Payer: Medicare Other | Source: Skilled Nursing Facility | Attending: Internal Medicine | Admitting: Internal Medicine

## 2022-04-18 ENCOUNTER — Emergency Department (HOSPITAL_COMMUNITY): Payer: Medicare Other

## 2022-04-18 ENCOUNTER — Ambulatory Visit (HOSPITAL_COMMUNITY)
Admission: EM | Admit: 2022-04-18 | Discharge: 2022-04-18 | Disposition: A | Discharge status: Home / Self Care | Payer: Medicare Other

## 2022-04-18 ENCOUNTER — Encounter (HOSPITAL_COMMUNITY): Payer: Self-pay | Admitting: Emergency Medicine

## 2022-04-18 DIAGNOSIS — M79605 Pain in left leg: Secondary | ICD-10-CM

## 2022-04-18 DIAGNOSIS — I723 Aneurysm of iliac artery: Secondary | ICD-10-CM | POA: Diagnosis present

## 2022-04-18 DIAGNOSIS — N184 Chronic kidney disease, stage 4 (severe): Secondary | ICD-10-CM

## 2022-04-18 DIAGNOSIS — I712 Thoracic aortic aneurysm, without rupture, unspecified: Secondary | ICD-10-CM | POA: Diagnosis present

## 2022-04-18 DIAGNOSIS — I70202 Unspecified atherosclerosis of native arteries of extremities, left leg: Principal | ICD-10-CM | POA: Diagnosis present

## 2022-04-18 DIAGNOSIS — L03116 Cellulitis of left lower limb: Secondary | ICD-10-CM

## 2022-04-18 DIAGNOSIS — I130 Hypertensive heart and chronic kidney disease with heart failure and stage 1 through stage 4 chronic kidney disease, or unspecified chronic kidney disease: Secondary | ICD-10-CM | POA: Diagnosis present

## 2022-04-18 DIAGNOSIS — M109 Gout, unspecified: Secondary | ICD-10-CM | POA: Diagnosis present

## 2022-04-18 DIAGNOSIS — R040 Epistaxis: Secondary | ICD-10-CM | POA: Diagnosis present

## 2022-04-18 DIAGNOSIS — I10 Essential (primary) hypertension: Secondary | ICD-10-CM | POA: Diagnosis not present

## 2022-04-18 DIAGNOSIS — Z91041 Radiographic dye allergy status: Secondary | ICD-10-CM | POA: Diagnosis not present

## 2022-04-18 DIAGNOSIS — M7989 Other specified soft tissue disorders: Secondary | ICD-10-CM

## 2022-04-18 DIAGNOSIS — I5032 Chronic diastolic (congestive) heart failure: Secondary | ICD-10-CM | POA: Diagnosis present

## 2022-04-18 DIAGNOSIS — M069 Rheumatoid arthritis, unspecified: Secondary | ICD-10-CM | POA: Diagnosis present

## 2022-04-18 DIAGNOSIS — R52 Pain, unspecified: Secondary | ICD-10-CM

## 2022-04-18 DIAGNOSIS — D649 Anemia, unspecified: Secondary | ICD-10-CM

## 2022-04-18 DIAGNOSIS — L538 Other specified erythematous conditions: Secondary | ICD-10-CM

## 2022-04-18 DIAGNOSIS — I739 Peripheral vascular disease, unspecified: Secondary | ICD-10-CM

## 2022-04-18 DIAGNOSIS — N179 Acute kidney failure, unspecified: Secondary | ICD-10-CM | POA: Diagnosis present

## 2022-04-18 DIAGNOSIS — K219 Gastro-esophageal reflux disease without esophagitis: Secondary | ICD-10-CM | POA: Diagnosis present

## 2022-04-18 DIAGNOSIS — I482 Chronic atrial fibrillation, unspecified: Secondary | ICD-10-CM | POA: Diagnosis present

## 2022-04-18 DIAGNOSIS — J449 Chronic obstructive pulmonary disease, unspecified: Secondary | ICD-10-CM | POA: Diagnosis present

## 2022-04-18 DIAGNOSIS — H3530 Unspecified macular degeneration: Secondary | ICD-10-CM | POA: Diagnosis present

## 2022-04-18 DIAGNOSIS — E039 Hypothyroidism, unspecified: Secondary | ICD-10-CM | POA: Diagnosis present

## 2022-04-18 DIAGNOSIS — D631 Anemia in chronic kidney disease: Secondary | ICD-10-CM | POA: Diagnosis present

## 2022-04-18 DIAGNOSIS — R6889 Other general symptoms and signs: Secondary | ICD-10-CM | POA: Diagnosis not present

## 2022-04-18 DIAGNOSIS — D6859 Other primary thrombophilia: Secondary | ICD-10-CM

## 2022-04-18 DIAGNOSIS — Z8673 Personal history of transient ischemic attack (TIA), and cerebral infarction without residual deficits: Secondary | ICD-10-CM

## 2022-04-18 DIAGNOSIS — Z66 Do not resuscitate: Secondary | ICD-10-CM | POA: Diagnosis present

## 2022-04-18 DIAGNOSIS — N400 Enlarged prostate without lower urinary tract symptoms: Secondary | ICD-10-CM | POA: Diagnosis present

## 2022-04-18 DIAGNOSIS — E860 Dehydration: Secondary | ICD-10-CM | POA: Diagnosis present

## 2022-04-18 DIAGNOSIS — Z87891 Personal history of nicotine dependence: Secondary | ICD-10-CM

## 2022-04-18 DIAGNOSIS — E876 Hypokalemia: Secondary | ICD-10-CM

## 2022-04-18 DIAGNOSIS — D469 Myelodysplastic syndrome, unspecified: Secondary | ICD-10-CM | POA: Diagnosis present

## 2022-04-18 DIAGNOSIS — D696 Thrombocytopenia, unspecified: Secondary | ICD-10-CM

## 2022-04-18 DIAGNOSIS — I480 Paroxysmal atrial fibrillation: Secondary | ICD-10-CM | POA: Diagnosis present

## 2022-04-18 DIAGNOSIS — I4891 Unspecified atrial fibrillation: Secondary | ICD-10-CM

## 2022-04-18 DIAGNOSIS — Z79890 Hormone replacement therapy (postmenopausal): Secondary | ICD-10-CM

## 2022-04-18 DIAGNOSIS — G629 Polyneuropathy, unspecified: Secondary | ICD-10-CM | POA: Diagnosis present

## 2022-04-18 DIAGNOSIS — Z950 Presence of cardiac pacemaker: Secondary | ICD-10-CM

## 2022-04-18 DIAGNOSIS — I70229 Atherosclerosis of native arteries of extremities with rest pain, unspecified extremity: Secondary | ICD-10-CM | POA: Diagnosis not present

## 2022-04-18 DIAGNOSIS — E785 Hyperlipidemia, unspecified: Secondary | ICD-10-CM | POA: Diagnosis present

## 2022-04-18 DIAGNOSIS — L97529 Non-pressure chronic ulcer of other part of left foot with unspecified severity: Secondary | ICD-10-CM | POA: Diagnosis present

## 2022-04-18 DIAGNOSIS — Z79899 Other long term (current) drug therapy: Secondary | ICD-10-CM

## 2022-04-18 HISTORY — DX: Chronic kidney disease, stage 4 (severe): N17.9

## 2022-04-18 LAB — PROTIME-INR
INR: 1.3 — ABNORMAL HIGH (ref 0.8–1.2)
Prothrombin Time: 15.6 seconds — ABNORMAL HIGH (ref 11.4–15.2)

## 2022-04-18 LAB — CBC
HCT: 28.1 % — ABNORMAL LOW (ref 39.0–52.0)
Hemoglobin: 9.5 g/dL — ABNORMAL LOW (ref 13.0–17.0)
MCH: 33.7 pg (ref 26.0–34.0)
MCHC: 33.8 g/dL (ref 30.0–36.0)
MCV: 99.6 fL (ref 80.0–100.0)
Platelets: 113 10*3/uL — ABNORMAL LOW (ref 150–400)
RBC: 2.82 MIL/uL — ABNORMAL LOW (ref 4.22–5.81)
RDW: 13.7 % (ref 11.5–15.5)
WBC: 10.2 10*3/uL (ref 4.0–10.5)
nRBC: 0 % (ref 0.0–0.2)

## 2022-04-18 LAB — COMPREHENSIVE METABOLIC PANEL
ALT: 23 U/L (ref 0–44)
AST: 32 U/L (ref 15–41)
Albumin: 3.1 g/dL — ABNORMAL LOW (ref 3.5–5.0)
Alkaline Phosphatase: 200 U/L — ABNORMAL HIGH (ref 38–126)
Anion gap: 13 (ref 5–15)
BUN: 101 mg/dL — ABNORMAL HIGH (ref 8–23)
CO2: 20 mmol/L — ABNORMAL LOW (ref 22–32)
Calcium: 9.3 mg/dL (ref 8.9–10.3)
Chloride: 102 mmol/L (ref 98–111)
Creatinine, Ser: 3.27 mg/dL — ABNORMAL HIGH (ref 0.61–1.24)
GFR, Estimated: 17 mL/min — ABNORMAL LOW (ref >60)
Glucose, Bld: 109 mg/dL — ABNORMAL HIGH (ref 70–99)
Potassium: 3.2 mmol/L — ABNORMAL LOW (ref 3.5–5.1)
Sodium: 135 mmol/L (ref 135–145)
Total Bilirubin: 1.1 mg/dL (ref 0.3–1.2)
Total Protein: 6.3 g/dL — ABNORMAL LOW (ref 6.5–8.1)

## 2022-04-18 LAB — LACTIC ACID, PLASMA: Lactic Acid, Venous: 0.8 mmol/L (ref 0.5–1.9)

## 2022-04-18 LAB — DIGOXIN LEVEL: Digoxin Level: 0.4 ng/mL — ABNORMAL LOW (ref 0.8–2.0)

## 2022-04-18 MED ORDER — PANTOPRAZOLE SODIUM 40 MG PO TBEC
40.0000 mg | DELAYED_RELEASE_TABLET | Freq: Every day | ORAL | Status: DC
  Administered 2022-04-19 – 2022-04-27 (×8): 40 mg via ORAL
  Filled 2022-04-18 (×8): qty 1

## 2022-04-18 MED ORDER — POTASSIUM CHLORIDE CRYS ER 20 MEQ PO TBCR
20.0000 meq | EXTENDED_RELEASE_TABLET | Freq: Once | ORAL | Status: AC
  Administered 2022-04-18: 20 meq via ORAL
  Filled 2022-04-18: qty 1

## 2022-04-18 MED ORDER — TAMSULOSIN HCL 0.4 MG PO CAPS
0.4000 mg | ORAL_CAPSULE | Freq: Every day | ORAL | Status: DC
  Administered 2022-04-18 – 2022-04-27 (×9): 0.4 mg via ORAL
  Filled 2022-04-18 (×9): qty 1

## 2022-04-18 MED ORDER — ACETAMINOPHEN 650 MG RE SUPP
650.0000 mg | Freq: Four times a day (QID) | RECTAL | Status: DC | PRN
Start: 2022-04-18 — End: 2022-04-22

## 2022-04-18 MED ORDER — ACETAMINOPHEN 325 MG PO TABS
650.0000 mg | ORAL_TABLET | Freq: Four times a day (QID) | ORAL | Status: DC | PRN
Start: 2022-04-18 — End: 2022-04-22
  Administered 2022-04-19 – 2022-04-20 (×2): 650 mg via ORAL
  Filled 2022-04-18 (×2): qty 2

## 2022-04-18 MED ORDER — ALLOPURINOL 100 MG PO TABS
100.0000 mg | ORAL_TABLET | Freq: Every day | ORAL | Status: DC
  Administered 2022-04-18 – 2022-04-27 (×9): 100 mg via ORAL
  Filled 2022-04-18 (×9): qty 1

## 2022-04-18 MED ORDER — CARVEDILOL 6.25 MG PO TABS
6.2500 mg | ORAL_TABLET | Freq: Two times a day (BID) | ORAL | Status: DC
  Administered 2022-04-19 – 2022-04-27 (×15): 6.25 mg via ORAL
  Filled 2022-04-18 (×17): qty 1

## 2022-04-18 MED ORDER — PREGABALIN 75 MG PO CAPS
75.0000 mg | ORAL_CAPSULE | Freq: Every day | ORAL | Status: DC
  Administered 2022-04-18 – 2022-04-21 (×4): 75 mg via ORAL
  Filled 2022-04-18: qty 3
  Filled 2022-04-18 (×3): qty 1

## 2022-04-18 MED ORDER — LACTATED RINGERS IV SOLN
INTRAVENOUS | Status: AC
Start: 2022-04-18 — End: 2022-04-19

## 2022-04-18 MED ORDER — LEVOTHYROXINE SODIUM 100 MCG PO TABS
100.0000 ug | ORAL_TABLET | Freq: Every day | ORAL | Status: DC
  Administered 2022-04-19 – 2022-04-27 (×8): 100 ug via ORAL
  Filled 2022-04-18 (×8): qty 1

## 2022-04-18 MED ORDER — POLYVINYL ALCOHOL 1.4 % OP SOLN
1.0000 [drp] | Freq: Three times a day (TID) | OPHTHALMIC | Status: DC
  Administered 2022-04-18 – 2022-04-27 (×34): 1 [drp] via OPHTHALMIC
  Filled 2022-04-18 (×2): qty 15

## 2022-04-18 MED ORDER — SODIUM CHLORIDE 0.9 % IV SOLN
1.0000 g | INTRAVENOUS | Status: AC
  Administered 2022-04-19 – 2022-04-25 (×7): 1 g via INTRAVENOUS
  Filled 2022-04-18 (×7): qty 10

## 2022-04-18 MED ORDER — HEPARIN SODIUM (PORCINE) 5000 UNIT/ML IJ SOLN
5000.0000 [IU] | Freq: Three times a day (TID) | INTRAMUSCULAR | Status: DC
Start: 2022-04-18 — End: 2022-04-24
  Administered 2022-04-18 – 2022-04-23 (×16): 5000 [IU] via SUBCUTANEOUS
  Filled 2022-04-18 (×16): qty 1

## 2022-04-18 MED ORDER — DIGOXIN 125 MCG PO TABS
0.1250 mg | ORAL_TABLET | ORAL | Status: DC
  Administered 2022-04-20 – 2022-04-27 (×3): 0.125 mg via ORAL
  Filled 2022-04-18 (×3): qty 1

## 2022-04-18 MED ORDER — ROPINIROLE HCL 1 MG PO TABS
2.0000 mg | ORAL_TABLET | Freq: Every day | ORAL | Status: DC
  Administered 2022-04-18 – 2022-04-26 (×9): 2 mg via ORAL
  Filled 2022-04-18 (×10): qty 2

## 2022-04-18 MED ORDER — SODIUM CHLORIDE 0.9 % IV BOLUS
1000.0000 mL | Freq: Once | INTRAVENOUS | Status: AC
  Administered 2022-04-18: 1000 mL via INTRAVENOUS

## 2022-04-18 MED ORDER — PIPERACILLIN-TAZOBACTAM 3.375 G IVPB 30 MIN
3.3750 g | Freq: Once | INTRAVENOUS | Status: AC
  Administered 2022-04-18: 3.375 g via INTRAVENOUS
  Filled 2022-04-18 (×2): qty 50

## 2022-04-18 MED ORDER — SENNOSIDES-DOCUSATE SODIUM 8.6-50 MG PO TABS
1.0000 | ORAL_TABLET | Freq: Every evening | ORAL | Status: DC | PRN
Start: 2022-04-18 — End: 2022-04-26

## 2022-04-18 NOTE — H&P (Signed)
History and Physical    Steven Williams PPI:[id number] DOB: [birth year] DOA: 04/18/2022  PCP: Ginger Organ., MD   Patient coming from: [facility]   Chief Complaint: Left leg pain and swelling   HPI: Steven Williams is a pleasant 88 y.o. male with medical history significant for symptomatic bradycardia with pacer, CKD stage IV, chronic anemia and thrombocytopenia, thoracic aortic aneurysm followed by CT surgery, and paroxysmal atrial fibrillation off of anticoagulation for the past month due to severe epistaxis, now presenting to the emergency department with worsening left lower leg pain, swelling, and erythema.  Patient has had some general weakness, fatigue, and chills in the past few days, along with worsening redness, pain, and swelling of the left lower leg.  He denies new cough or shortness of breath, and denies chest pain.  ED Course: Upon arrival to the ED, patient is found to be afebrile and saturating well on room air with normal heart rate and stable blood pressure.  Chemistry panel notable for potassium 3.2, BUN 101, and creatinine 3.27.  CBC with stable chronic anemia and thrombocytopenia.  Venous Doppler of the left lower extremity is negative for DVT (preliminary read).  Blood cultures were collected in the ED and the patient was given a liter of saline and Zosyn.  Review of Systems:  All other systems reviewed and apart from HPI, are negative.  Past Medical History:  Diagnosis Date   Antiphospholipid antibody positive    Aortic aneurysm (HCC)    thoracic, Dr Roxan Hockey, with h/o dissection   BPH (benign prostatic hyperplasia)    Central retinal artery occlusion    Chronic anemia    Chronic renal insufficiency    COPD (chronic obstructive pulmonary disease) (HCC)    DDD (degenerative disc disease), cervical    DJD (degenerative joint disease)    DVT (deep venous thrombosis) (HCC)    Gastric dysmotility    GERD (gastroesophageal reflux disease)    Gout    HTN (hypertension)     Hyperlipidemia    Hypertensive retinopathy    OU   Hypothyroid    Macular degeneration    Myelodysplasia (myelodysplastic syndrome) (HCC)    Paget's disease of bone    Peripheral neuropathy    Permanent atrial fibrillation (HCC)    Recurrent epistaxis 06/26/2020   Rheumatoid arthritis ([place])    TIA (transient ischemic attack)     Past Surgical History:  Procedure Laterality Date   ATRIAL FIBRILLATION ABLATION  09/23/2014   Dr Encarnacion Chu in [place]     lumbar, x 2   CAROTID ENDARTERECTOMY Right 1990   CAROTID STENT     CATARACT EXTRACTION Bilateral 2010   [place]   EYE SURGERY Bilateral 2010   [facility]   LUNG DECORTICATION     [facility]   PACEMAKER GENERATOR CHANGE  12/03/2011   [facility] ADDR01 generator change by Dr Encarnacion Chu for symptomatic bradycardia   PACEMAKER IMPLANT  2004   [facility]     Social History:   reports that he quit smoking about 59 years ago. His smoking use included cigarettes. He has a 24.00 pack-year smoking history. He has never used smokeless tobacco. He reports current alcohol use of about 2.0 standard drinks of alcohol per week. He reports that he does not use drugs.  Allergies  Allergen Reactions   Iodinated Contrast Media Rash    Family History  Problem Relation Age of Onset   Cirrhosis Mother    Stroke Father  Lung cancer Sister    Lung cancer Brother      Prior to Admission medications   Medication Sig Start Date End Date Taking? Authorizing Provider  acetaminophen (TYLENOL) 650 MG CR tablet Take 1,300 mg by mouth in the morning.   Yes [provider]  allopurinol (ZYLOPRIM) 100 MG tablet Take 100 mg by mouth daily.   Yes [provider]  carvedilol (COREG) 6.25 MG tablet Take 6.25 mg by mouth 2 (two) times daily with a meal.   Yes [provider]  Cholecalciferol (VITAMIN D3) 50 MCG (2000 UT) TABS Take 2,000 Units by mouth daily.   Yes [provider]  digoxin (LANOXIN) 0.125 MG tablet Take  0.125 mg by mouth every Monday, Wednesday, and Friday.   Yes [provider]  Ferrous Sulfate (IRON) 325 (65 Fe) MG TABS Take 1 tablet by mouth 2 (two) times daily.   Yes [provider]  furosemide (LASIX) 40 MG tablet Take 40 mg by mouth in the morning.   Yes [provider]  hydrocortisone (ANUSOL-HC) 2.5 % rectal cream Apply 1 application topically 2 (two) times daily as needed for hemorrhoids or anal itching. 04/15/21  Yes [provider]  levothyroxine (SYNTHROID) 100 MCG tablet Take 100 mcg by mouth daily before breakfast. 06/04/20  Yes [provider]  Multiple Vitamins-Minerals (PRESERVISION AREDS PO) Take 1 capsule by mouth 2 (two) times daily.   Yes [provider]  pantoprazole (PROTONIX) 40 MG tablet Take 40 mg by mouth daily before breakfast. 06/04/20  Yes [provider]  Polyvinyl Alcohol-Povidone (REFRESH OP) Place 1 drop into both eyes 4 (four) times daily.   Yes [provider]  pregabalin (LYRICA) 75 MG capsule Take 75 mg by mouth at bedtime.   Yes [provider]  rOPINIRole (REQUIP) 1 MG tablet Take 2 tablets (2 mg total) by mouth at bedtime. 02/19/22  Yes Penumalli, Earlean Polka, MD  saw palmetto 500 MG capsule Take 500 mg by mouth daily.   Yes [provider]  sodium chloride (OCEAN) 0.65 % SOLN nasal spray Place 2 sprays into both nostrils every 4 (four) hours while awake. 01/11/22 04/18/22 Yes Pahwani, Einar Grad, MD  tamsulosin (FLOMAX) 0.4 MG CAPS capsule Take 0.4 mg by mouth daily.   Yes [provider]  traMADol (ULTRAM) 50 MG tablet Take 50 mg by mouth every 8 (eight) hours as needed for moderate pain. 04/17/22  Yes [provider]  vitamin B-12 (CYANOCOBALAMIN) 1000 MCG tablet Take 1 tablet (1,000 mcg total) by mouth daily. 07/09/21  Yes Orson Slick, MD  gabapentin (NEURONTIN) 400 MG capsule Take 400 mg by mouth at bedtime.    [provider]    Physical  Exam: Vitals:   04/18/22 1915 04/18/22 1923 04/18/22 1930 04/18/22 2000  BP: 116/62  (!) 113/59 (!) 102/54  Pulse: 86  81 76  Resp: '16  15 15  '$ Temp:  97.8 F (36.6 C)    TempSrc:  Oral    SpO2: 100%  100% 97%    Constitutional: NAD, calm  Eyes: PERTLA, lids and conjunctivae normal ENMT: Mucous membranes are moist. Posterior pharynx clear of any exudate or lesions.   Neck: supple, no masses  Respiratory:  no wheezing, no crackles. No accessory muscle use.  Cardiovascular: S1 & S2 heard, regular rate and rhythm. No significant JVD. Abdomen: No distension, no tenderness, soft. Bowel sounds active.  Musculoskeletal: no clubbing / cyanosis. No joint deformity upper and lower  extremities.   Skin: Swelling, discoloration, and tenderness of lower left leg. Warm, dry, well-perfused. Neurologic: CN 2-12 grossly intact. Moving all extremities. Alert and oriented.  Psychiatric: Very pleasant. Cooperative.    Labs and Imaging on Admission: I have personally reviewed following labs and imaging studies  CBC: Recent Labs  Lab 04/18/22 1839  WBC 10.2  HGB 9.5*  HCT 28.1*  MCV 99.6  PLT 425*   Basic Metabolic Panel: Recent Labs  Lab 04/18/22 1839  NA 135  K 3.2*  CL 102  CO2 20*  GLUCOSE 109*  BUN 101*  CREATININE 3.27*  CALCIUM 9.3   GFR: CrCl cannot be calculated (Unknown ideal weight.). Liver Function Tests: Recent Labs  Lab 04/18/22 1839  AST 32  ALT 23  ALKPHOS 200*  BILITOT 1.1  PROT 6.3*  ALBUMIN 3.1*   No results for input(s): "LIPASE", "AMYLASE" in the last 168 hours. No results for input(s): "AMMONIA" in the last 168 hours. Coagulation Profile: Recent Labs  Lab 04/18/22 2035  INR 1.3*   Cardiac Enzymes: No results for input(s): "CKTOTAL", "CKMB", "CKMBINDEX", "TROPONINI" in the last 168 hours. BNP (last 3 results) No results for input(s): "PROBNP" in the last 8760 hours. HbA1C: No results for input(s): "HGBA1C" in the last 72 hours. CBG: No  results for input(s): "GLUCAP" in the last 168 hours. Lipid Profile: No results for input(s): "CHOL", "HDL", "LDLCALC", "TRIG", "CHOLHDL", "LDLDIRECT" in the last 72 hours. Thyroid Function Tests: No results for input(s): "TSH", "T4TOTAL", "FREET4", "T3FREE", "THYROIDAB" in the last 72 hours. Anemia Panel: No results for input(s): "VITAMINB12", "FOLATE", "FERRITIN", "TIBC", "IRON", "RETICCTPCT" in the last 72 hours. Urine analysis: No results found for: "COLORURINE", "APPEARANCEUR", "LABSPEC", "PHURINE", "GLUCOSEU", "HGBUR", "BILIRUBINUR", "KETONESUR", "PROTEINUR", "UROBILINOGEN", "NITRITE", "LEUKOCYTESUR" Sepsis Labs: '@LABRCNTIP'$ (procalcitonin:4,lacticidven:4) )No results found for this or any previous visit (from the past 240 hour(s)).   Radiological Exams on Admission: VAS [place] LOWER EXTREMITY VENOUS (DVT) (ONLY [facility] & [facility])  Result Date: 04/18/2022  Lower Venous DVT Study Patient Name:  LAVAN IMES  Date of Exam:   04/18/2022 Medical Rec #: [id number]      Accession #:    [id number] Date of Birth: 11-08-1929      Patient Gender: M Patient Age:   39 years Exam Location:  [facility] Procedure:      VAS [place] LOWER EXTREMITY VENOUS (DVT) Referring Phys: Herbie [place] PATERSON --------------------------------------------------------------------------------  Indications: Pain, Swelling, and Erythema.  Limitations: Patient unable to tolerate compressions secondary to pain. Comparison Study: No prior study on file Performing Technologist: Sharion Dove RVS  Examination Guidelines: A complete evaluation includes B-mode imaging, spectral Doppler, color Doppler, and power Doppler as needed of all accessible portions of each vessel. Bilateral testing is considered an integral part of a complete examination. Limited examinations for reoccurring indications may be performed as noted. The reflux portion of the exam is performed with the patient in reverse Trendelenburg.   +-----+---------------+---------+-----------+-------------------+--------------+ RIGHTCompressibilityPhasicitySpontaneityProperties         Thrombus Aging +-----+---------------+---------+-----------+-------------------+--------------+ CFV  Full                               Pulsatile waveforms               +-----+---------------+---------+-----------+-------------------+--------------+   +--------+---------------+---------+-----------+----------------+-------------+ LEFT    CompressibilityPhasicitySpontaneityProperties      Thrombus  Aging         +--------+---------------+---------+-----------+----------------+-------------+ CFV     Full                               Pulsatile                                                                waveforms                     +--------+---------------+---------+-----------+----------------+-------------+ SFJ     Full                                                             +--------+---------------+---------+-----------+----------------+-------------+ FV Prox Full                               Pulsatile                                                                waveforms                     +--------+---------------+---------+-----------+----------------+-------------+ FV Mid                                     Pulsatile                                                                waveforms                     +--------+---------------+---------+-----------+----------------+-------------+ FV                                         Pulsatile                     Distal                                     waveforms                     +--------+---------------+---------+-----------+----------------+-------------+ PFV     Full                               Pulsatile  waveforms                     +--------+---------------+---------+-----------+----------------+-------------+ POP                                        Pulsatile                                                                waveforms                     +--------+---------------+---------+-----------+----------------+-------------+ PTV     Full                                                             +--------+---------------+---------+-----------+----------------+-------------+ PERO    Full                                                             +--------+---------------+---------+-----------+----------------+-------------+    Summary: RIGHT: - No evidence of common femoral vein obstruction. Pulsatile waveforms  LEFT: - There is no evidence of deep vein thrombosis in the lower extremity.  - No cystic structure found in the popliteal fossa. Pulsatile waveforms.  *See table(s) above for measurements and observations.    Preliminary      Assessment/Plan  1. Left leg cellulitis  - Presents with ~1 wk of increased left lower leg pain, swelling, and redness  - He is not septic on admission  - Venous US negative for DVT  - Blood cultures were collected in ED and he was started on antibiotics  - Treat with Rocephin, follow cultures and clinical response to treatment, consider imaging if worsens or fails to improve as expected in next 24-48 hrs    2. AKI superimposed on CKD IV  - BUN 101 and SCr 3.27 in ED, up from 48 & 2.59 three weeks ago  - Family thinks he has not been eating or drinking much lately but patient denies this  - He was given a liter of NS in ED  - Hold Lasix, continue IVF hydration, check UA and FEUrea, check renal US, renally-dose medications, repeat serum chemistries in am   3. PAF  - Has been off of warfarin for ~1 month d/t severe epistaxis  - Continue digoxin, continue cardiology follow-up as planned to discuss  resumption of anticoagulation    4. HTN  - Continue Coreg    5. Chronic HFpEF  - Appears compensated  - Hold Lasix while hydrating, monitor volume status   6. Anemia; thrombocytopenia  - Appears stable, no bleeding     DVT prophylaxis: sq heparin  Code Status: DNR, confirmed on admission  Level of Care:  Level of care: Med-Surg Family Communication: None present  Disposition Plan:  Patient is from: [facility]  Anticipated  d/c is to: TBD Anticipated d/c date is: 04/21/22  Patient currently: Pending improvement in cellulitis and renal function, transition to oral medications  Consults called: None   Admission status: Inpatient     Vianne Bulls, MD [facility]  04/18/2022, 9:23 PM

## 2022-04-18 NOTE — ED Provider Notes (Signed)
[facility] EMERGENCY DEPARTMENT Provider Note   CSN: [id number] Arrival date & time: 04/18/22  1430     History  Chief Complaint  Patient presents with   Leg Swelling    Lexander Tremblay is a 88 y.o. male.  Patient c/o left leg redness and swelling in past week. Symptoms acute onset, moderate, persistent. Felt cold earlier, no definite fevers.  Denies trauma to leg. No hx dvt or pe. No chest pain or sob.   The history is provided by the patient, medical records and a relative.       Home Medications Prior to Admission medications   Medication Sig Start Date End Date Taking? Authorizing Provider  acetaminophen (TYLENOL) 650 MG CR tablet Take 1,300 mg by mouth in the morning.    [provider]  allopurinol (ZYLOPRIM) 100 MG tablet Take 100 mg by mouth daily.    [provider]  carvedilol (COREG) 6.25 MG tablet Take 6.25 mg by mouth 2 (two) times daily with a meal.    [provider]  Cholecalciferol (VITAMIN D3 PO) Take 1 capsule by mouth in the morning and at bedtime.    [provider]  Cholecalciferol (VITAMIN D3) 50 MCG (2000 UT) TABS Take 2,000 Units by mouth daily.    [provider]  dicyclomine (BENTYL) 10 MG capsule Take 30 mg by mouth in the morning and at bedtime.    [provider]  digoxin (LANOXIN) 0.125 MG tablet Take 0.125 mg by mouth every Monday, Wednesday, and Friday.    [provider]  Ferrous Sulfate (IRON) 325 (65 Fe) MG TABS Take 1 tablet by mouth 2 (two) times daily.    [provider]  furosemide (LASIX) 40 MG tablet Take 40 mg by mouth in the morning.    [provider]  gabapentin (NEURONTIN) 400 MG capsule Take 400 mg by mouth at bedtime.    [provider]  hydrocortisone (ANUSOL-HC) 2.5 % rectal cream Apply 1 application topically 2 (two) times daily as needed for hemorrhoids or anal itching. 04/15/21   [provider]  levothyroxine  (SYNTHROID) 100 MCG tablet Take 100 mcg by mouth daily before breakfast. 06/04/20   [provider]  Multiple Vitamins-Minerals (PRESERVISION AREDS PO) Take 1 capsule by mouth 2 (two) times daily.    [provider]  mupirocin ointment (BACTROBAN) 2 % Apply 1 application. topically See admin instructions. Apply 1/2 inch strip to bilateral nares twice daily for 14 days and then as needed for crusting 12/29/21   [provider]  pantoprazole (PROTONIX) 40 MG tablet Take 40 mg by mouth daily before breakfast. 06/04/20   [provider]  Polyvinyl Alcohol-Povidone (REFRESH OP) Place 1 drop into both eyes 4 (four) times daily.    [provider]  pregabalin (LYRICA) 75 MG capsule Take 75 mg by mouth at bedtime.    [provider]  rOPINIRole (REQUIP) 1 MG tablet Take 2 tablets (2 mg total) by mouth at bedtime. 02/19/22   Penumalli, Earlean Polka, MD  saw palmetto 500 MG capsule Take 500 mg by mouth daily.    [provider]  sodium chloride (OCEAN) 0.65 % SOLN nasal spray Place 2 sprays into both nostrils every 4 (four) hours while awake. 01/11/22 02/10/22  Darliss Cheney, MD  tamsulosin (FLOMAX) 0.4 MG CAPS capsule Take 0.4 mg by mouth daily.    [provider]  vitamin B-12 (CYANOCOBALAMIN) 1000 MCG tablet Take 1 tablet (1,000 mcg total)  by mouth daily. 07/09/21   Orson Slick, MD  warfarin (COUMADIN) 2 MG tablet Take 2 mg by mouth at bedtime. 05/13/21   [provider]      Allergies    Iodinated contrast media    Review of Systems   Review of Systems  Constitutional:  Negative for fever.  HENT:  Negative for sore throat.   Eyes:  Negative for redness.  Respiratory:  Negative for shortness of breath.   Cardiovascular:  Negative for chest pain.  Gastrointestinal:  Negative for abdominal pain and vomiting.  Genitourinary:  Negative for flank pain.  Musculoskeletal:  Negative for back pain and neck pain.  Skin:  Negative for  rash.  Neurological:  Negative for headaches.  Hematological:  Does not bruise/bleed easily.  Psychiatric/Behavioral:  Negative for confusion.     Physical Exam Updated Vital Signs BP (!) 116/58 (BP Location: Left Arm)   Pulse 80   Temp 97.7 F (36.5 C) (Oral)   Resp 16   SpO2 100%  Physical Exam Vitals and nursing note reviewed.  Constitutional:      Appearance: Normal appearance. He is well-developed.  HENT:     Head: Atraumatic.     Nose: Nose normal.     Mouth/Throat:     Mouth: Mucous membranes are moist.     Pharynx: Oropharynx is clear.  Eyes:     General: No scleral icterus.    Conjunctiva/sclera: Conjunctivae normal.  Neck:     Trachea: No tracheal deviation.  Cardiovascular:     Rate and Rhythm: Normal rate and regular rhythm.     Pulses: Normal pulses.     Heart sounds: Normal heart sounds. No murmur heard.    No friction rub. No gallop.  Pulmonary:     Effort: Pulmonary effort is normal. No accessory muscle usage or respiratory distress.     Breath sounds: Normal breath sounds.  Abdominal:     General: There is no distension.     Tenderness: There is no abdominal tenderness.  Genitourinary:    Comments: No cva tenderness. Musculoskeletal:     Cervical back: Normal range of motion and neck supple. No rigidity.     Comments: Bil lower legs with chr dermatitis ?chr venous stasis, and dry/scaly skin bilateral lower legs and feet. LLE with increased swelling, redness and warmth as compared to right, with erythema/warmth extending up to distal left thigh. Compartments of leg are soft, not tense. No crepitus. Distal pulses palp.   Skin:    General: Skin is warm and dry.     Findings: No rash.  Neurological:     Mental Status: He is alert.     Comments: Alert, speech clear. Motor/sens grossly intact bil.   Psychiatric:        Mood and Affect: Mood normal.     ED Results / Procedures / Treatments   Labs (all labs ordered are listed, but only abnormal  results are displayed) Results for orders placed or performed during the hospital encounter of 04/18/22  CBC  Result Value Ref Range   WBC 10.2 4.0 - 10.5 K/uL   RBC 2.82 (L) 4.22 - 5.81 MIL/uL   Hemoglobin 9.5 (L) 13.0 - 17.0 g/dL   HCT 28.1 (L) 39.0 - 52.0 %   MCV 99.6 80.0 - 100.0 fL   MCH 33.7 26.0 - 34.0 pg   MCHC 33.8 30.0 - 36.0 g/dL   RDW 13.7 11.5 - 15.5 %   Platelets 113 (  L) 150 - 400 K/uL   nRBC 0.0 0.0 - 0.2 %  Comprehensive metabolic panel  Result Value Ref Range   Sodium 135 135 - 145 mmol/L   Potassium 3.2 (L) 3.5 - 5.1 mmol/L   Chloride 102 98 - 111 mmol/L   CO2 20 (L) 22 - 32 mmol/L   Glucose, Bld 109 (H) 70 - 99 mg/dL   BUN 101 (H) 8 - 23 mg/dL   Creatinine, Ser 3.27 (H) 0.61 - 1.24 mg/dL   Calcium 9.3 8.9 - 10.3 mg/dL   Total Protein 6.3 (L) 6.5 - 8.1 g/dL   Albumin 3.1 (L) 3.5 - 5.0 g/dL   AST 32 15 - 41 U/L   ALT 23 0 - 44 U/L   Alkaline Phosphatase 200 (H) 38 - 126 U/L   Total Bilirubin 1.1 0.3 - 1.2 mg/dL   GFR, Estimated 17 (L) >60 mL/min   Anion gap 13 5 - 15  Lactic acid, plasma  Result Value Ref Range   Lactic Acid, Venous 0.8 0.5 - 1.9 mmol/L   VAS [place] LOWER EXTREMITY VENOUS (DVT) (ONLY [facility] & [facility])  Result Date: 04/18/2022  Lower Venous DVT Study Patient Name:  KIYAAN HAQ  Date of Exam:   04/18/2022 Medical Rec #: [id number]      Accession #:    [id number] Date of Birth: Sep 09, 1929      Patient Gender: M Patient Age:   76 years Exam Location:  [facility] Procedure:      VAS [place] LOWER EXTREMITY VENOUS (DVT) Referring Phys: Herbie [place] PATERSON --------------------------------------------------------------------------------  Indications: Pain, Swelling, and Erythema.  Limitations: Patient unable to tolerate compressions secondary to pain. Comparison Study: No prior study on file Performing Technologist: Sharion Dove RVS  Examination Guidelines: A complete evaluation includes B-mode imaging, spectral Doppler, color Doppler, and power Doppler as needed  of all accessible portions of each vessel. Bilateral testing is considered an integral part of a complete examination. Limited examinations for reoccurring indications may be performed as noted. The reflux portion of the exam is performed with the patient in reverse Trendelenburg.  +-----+---------------+---------+-----------+-------------------+--------------+ RIGHTCompressibilityPhasicitySpontaneityProperties         Thrombus Aging +-----+---------------+---------+-----------+-------------------+--------------+ CFV  Full                               Pulsatile waveforms               +-----+---------------+---------+-----------+-------------------+--------------+   +--------+---------------+---------+-----------+----------------+-------------+ LEFT    CompressibilityPhasicitySpontaneityProperties      Thrombus                                                                 Aging         +--------+---------------+---------+-----------+----------------+-------------+ CFV     Full                               Pulsatile  waveforms                     +--------+---------------+---------+-----------+----------------+-------------+ SFJ     Full                                                             +--------+---------------+---------+-----------+----------------+-------------+ FV Prox Full                               Pulsatile                                                                waveforms                     +--------+---------------+---------+-----------+----------------+-------------+ FV Mid                                     Pulsatile                                                                waveforms                     +--------+---------------+---------+-----------+----------------+-------------+ FV                                         Pulsatile                      Distal                                     waveforms                     +--------+---------------+---------+-----------+----------------+-------------+ PFV     Full                               Pulsatile                                                                waveforms                     +--------+---------------+---------+-----------+----------------+-------------+ POP  Pulsatile                                                                waveforms                     +--------+---------------+---------+-----------+----------------+-------------+ PTV     Full                                                             +--------+---------------+---------+-----------+----------------+-------------+ PERO    Full                                                             +--------+---------------+---------+-----------+----------------+-------------+    Summary: RIGHT: - No evidence of common femoral vein obstruction. Pulsatile waveforms  LEFT: - There is no evidence of deep vein thrombosis in the lower extremity.  - No cystic structure found in the popliteal fossa. Pulsatile waveforms.  *See table(s) above for measurements and observations.    Preliminary    CUP PACEART REMOTE DEVICE CHECK  Result Date: 03/26/2022 Scheduled remote reviewed. Normal device function.  Known AF, Warfarin, Coreg, Digoxin Next remote 91 days. LA  CUP PACEART INCLINIC DEVICE CHECK  Result Date: 03/25/2022 Pacemaker check in clinic. Normal device function. Thresholds, sensing, impedances consistent with previous measurements. Device programmed to maximize longevity. Permanent AF. No high ventricular rates noted. Device programmed at appropriate safety margins. Histogram distribution appropriate for patient activity level. Estimated longevity 1 yr, 9 mo. Patient enrolled in remote follow-up. Patient education  completed.     EKG EKG Interpretation  Date/Time:  Saturday April 18 2022 20:27:49 EDT Ventricular Rate:  77 PR Interval:    QRS Duration: 123 QT Interval:  423 QTC Calculation: 479 R Axis:   -14 Text Interpretation: Atrial fibrillation Ventricular premature complex Nonspecific intraventricular conduction delay Nonspecific T wave abnormality Confirmed by Lajean Saver [phone]) on 04/18/2022 10:07:17 PM  Radiology VAS [place] LOWER EXTREMITY VENOUS (DVT) (ONLY [facility])  Result Date: 04/18/2022  Lower Venous DVT Study Patient Name:  AMEDEE CERRONE  Date of Exam:   04/18/2022 Medical Rec #: [id number]      Accession #:    [id number] Date of Birth: 07/27/1930      Patient Gender: M Patient Age:   58 years Exam Location:  [facility] Procedure:      VAS [place] LOWER EXTREMITY VENOUS (DVT) Referring Phys: Herbie Baltimore PATERSON --------------------------------------------------------------------------------  Indications: Pain, Swelling, and Erythema.  Limitations: Patient unable to tolerate compressions secondary to pain. Comparison Study: No prior study on file Performing Technologist: Sharion Dove RVS  Examination Guidelines: A complete evaluation includes B-mode imaging, spectral Doppler, color Doppler, and power Doppler as needed of all accessible portions of each vessel. Bilateral testing is considered an integral part of a complete examination. Limited examinations for reoccurring indications may be performed as noted. The reflux portion of the exam is  performed with the patient in reverse Trendelenburg.  +-----+---------------+---------+-----------+-------------------+--------------+ RIGHTCompressibilityPhasicitySpontaneityProperties         Thrombus Aging +-----+---------------+---------+-----------+-------------------+--------------+ CFV  Full                               Pulsatile waveforms               +-----+---------------+---------+-----------+-------------------+--------------+    +--------+---------------+---------+-----------+----------------+-------------+ LEFT    CompressibilityPhasicitySpontaneityProperties      Thrombus                                                                 Aging         +--------+---------------+---------+-----------+----------------+-------------+ CFV     Full                               Pulsatile                                                                waveforms                     +--------+---------------+---------+-----------+----------------+-------------+ SFJ     Full                                                             +--------+---------------+---------+-----------+----------------+-------------+ FV Prox Full                               Pulsatile                                                                waveforms                     +--------+---------------+---------+-----------+----------------+-------------+ FV Mid                                     Pulsatile                                                                waveforms                     +--------+---------------+---------+-----------+----------------+-------------+ FV  Pulsatile                     Distal                                     waveforms                     +--------+---------------+---------+-----------+----------------+-------------+ PFV     Full                               Pulsatile                                                                waveforms                     +--------+---------------+---------+-----------+----------------+-------------+ POP                                        Pulsatile                                                                waveforms                     +--------+---------------+---------+-----------+----------------+-------------+ PTV     Full                                                              +--------+---------------+---------+-----------+----------------+-------------+ PERO    Full                                                             +--------+---------------+---------+-----------+----------------+-------------+    Summary: RIGHT: - No evidence of common femoral vein obstruction. Pulsatile waveforms  LEFT: - There is no evidence of deep vein thrombosis in the lower extremity.  - No cystic structure found in the popliteal fossa. Pulsatile waveforms.  *See table(s) above for measurements and observations.    Preliminary     Procedures Procedures    Medications Ordered in ED Medications  piperacillin-tazobactam (ZOSYN) IVPB 3.375 g (has no administration in time range)    ED Course/ Medical Decision Making/ A&P                           Medical Decision Making Problems Addressed: AKI (acute kidney injury) [facility]): acute illness or injury with systemic symptoms that poses a threat to life  or bodily functions Atrial fibrillation, unspecified type [facility]): chronic illness or injury with exacerbation, progression, or side effects of treatment that poses a threat to life or bodily functions Cellulitis of left leg: acute illness or injury with systemic symptoms that poses a threat to life or bodily functions Chronic anemia: chronic illness or injury with exacerbation, progression, or side effects of treatment that poses a threat to life or bodily functions Dehydration: acute illness or injury with systemic symptoms that poses a threat to life or bodily functions Hypokalemia: acute illness or injury Stage 4 chronic kidney disease ([place]): chronic illness or injury with exacerbation, progression, or side effects of treatment that poses a threat to life or bodily functions Thrombocytopenia ([place]): chronic illness or injury  Amount and/or Complexity of Data Reviewed Independent Historian:     Details: family, hx External Data  Reviewed: notes. Labs: ordered. Decision-making details documented in ED Course. ECG/medicine tests: ordered and independent interpretation performed. Decision-making details documented in ED Course.  Risk Prescription drug management. Decision regarding hospitalization.   Iv ns. Continuous pulse ox and cardiac monitoring. Labs ordered/sent. Imaging ordered.   Reviewed nursing notes and prior charts for additional history. External reports reviewed. Additional history from: family member.   Cardiac monitor: sinus rhythm, rate 80.  Labs reviewed/interpreted by me - bun v high, aki on ckd.  Ns bolus.   U/s reviewed/interpreted by me - no dvt.   Cultures sent.   Zosyn iv.   Given significant cellulitis, poor po intake, dehydration, aki - will admit.   Medicine consulted for admission.            Final Clinical Impression(s) / ED Diagnoses Final diagnoses:  None    Rx / DC Orders ED Discharge Orders     None         Lajean Saver, MD 04/18/22 2208

## 2022-04-18 NOTE — Progress Notes (Signed)
VASCULAR LAB    Left lower extremity venous duplex has been performed.  See CV proc for preliminary results.   Amelie Caracci, RVT 04/18/2022, 5:09 PM

## 2022-04-18 NOTE — Discharge Instructions (Signed)
D/C to ED via POV

## 2022-04-18 NOTE — ED Provider Notes (Signed)
Presents to the urgent care for left leg pain and swelling. Began Sunday.  He has noticed increased swelling compared to his baseline, pain in the shin and calf.  He has history of carotid artery occlusion, descending thoracic aortic aneurysm, hypertension, hypercoagulable state, A-fib, chronic venous ulcer. He was recently taken off his warfarin last month due to nosebleeds.  On exam the left leg is notably swollen compared to the right.  It is warm to the touch.  There is dry skin and a dry, scaly ulcer on the lateral left leg.  Due to patient age, history, recent removal of warfarin, leg appearance, recommend patient be evaluated in the emergency department.  Discussed with patient and his daughter that he requires higher level of care than I can offer him in the urgent care.  Likely needs an ultrasound and further evaluation.  Patient and daughter agree to plan, discharged to the ED via personal vehicle.   Zayna Toste, [place] Gu[place], [place] 04/18/22 1428

## 2022-04-18 NOTE — ED Triage Notes (Signed)
Patient sent over from [facility] for swollen calf to the L side. Patient sent for evaluation for DVT. Pt noted to have reddened, swollen, warm to the touch calf on the L side in comparison to the R. Pt denies chest pain, SHOB, at this time. Denies fevers, chills. Aox4.

## 2022-04-19 ENCOUNTER — Inpatient Hospital Stay (HOSPITAL_COMMUNITY): Payer: Medicare Other

## 2022-04-19 DIAGNOSIS — L03116 Cellulitis of left lower limb: Secondary | ICD-10-CM | POA: Diagnosis not present

## 2022-04-19 LAB — URINALYSIS, COMPLETE (UACMP) WITH MICROSCOPIC
Bacteria, UA: NONE SEEN
Bilirubin Urine: NEGATIVE
Glucose, UA: NEGATIVE mg/dL
Hgb urine dipstick: NEGATIVE
Ketones, ur: NEGATIVE mg/dL
Leukocytes,Ua: NEGATIVE
Nitrite: NEGATIVE
Protein, ur: NEGATIVE mg/dL
Specific Gravity, Urine: 1.014 (ref 1.005–1.030)
pH: 5 (ref 5.0–8.0)

## 2022-04-19 LAB — BASIC METABOLIC PANEL
Anion gap: 12 (ref 5–15)
BUN: 94 mg/dL — ABNORMAL HIGH (ref 8–23)
CO2: 19 mmol/L — ABNORMAL LOW (ref 22–32)
Calcium: 8.7 mg/dL — ABNORMAL LOW (ref 8.9–10.3)
Chloride: 105 mmol/L (ref 98–111)
Creatinine, Ser: 3.07 mg/dL — ABNORMAL HIGH (ref 0.61–1.24)
GFR, Estimated: 19 mL/min — ABNORMAL LOW (ref >60)
Glucose, Bld: 132 mg/dL — ABNORMAL HIGH (ref 70–99)
Potassium: 3.2 mmol/L — ABNORMAL LOW (ref 3.5–5.1)
Sodium: 136 mmol/L (ref 135–145)

## 2022-04-19 LAB — CBC
HCT: 26.1 % — ABNORMAL LOW (ref 39.0–52.0)
Hemoglobin: 8.7 g/dL — ABNORMAL LOW (ref 13.0–17.0)
MCH: 33.6 pg (ref 26.0–34.0)
MCHC: 33.3 g/dL (ref 30.0–36.0)
MCV: 100.8 fL — ABNORMAL HIGH (ref 80.0–100.0)
Platelets: 107 10*3/uL — ABNORMAL LOW (ref 150–400)
RBC: 2.59 MIL/uL — ABNORMAL LOW (ref 4.22–5.81)
RDW: 13.8 % (ref 11.5–15.5)
WBC: 8.8 10*3/uL (ref 4.0–10.5)
nRBC: 0 % (ref 0.0–0.2)

## 2022-04-19 LAB — SODIUM, URINE, RANDOM: Sodium, Ur: 19 mmol/L

## 2022-04-19 LAB — CREATININE, URINE, RANDOM: Creatinine, Urine: 77 mg/dL

## 2022-04-19 LAB — MAGNESIUM: Magnesium: 2.2 mg/dL (ref 1.7–2.4)

## 2022-04-19 MED ORDER — FENTANYL CITRATE PF 50 MCG/ML IJ SOSY
50.0000 ug | PREFILLED_SYRINGE | INTRAMUSCULAR | Status: DC | PRN
  Administered 2022-04-19 – 2022-04-20 (×6): 50 ug via INTRAVENOUS
  Filled 2022-04-19 (×6): qty 1

## 2022-04-19 MED ORDER — POTASSIUM CHLORIDE CRYS ER 20 MEQ PO TBCR
40.0000 meq | EXTENDED_RELEASE_TABLET | Freq: Once | ORAL | Status: AC
Start: 2022-04-19 — End: 2022-04-19
  Administered 2022-04-19: 40 meq via ORAL
  Filled 2022-04-19: qty 2

## 2022-04-19 MED ORDER — SODIUM CHLORIDE 0.9 % IV SOLN: INTRAVENOUS | Status: DC

## 2022-04-19 MED ORDER — VANCOMYCIN HCL 1250 MG/250ML IV SOLN
1250.0000 mg | INTRAVENOUS | Status: DC
Start: 2022-04-21 — End: 2022-04-20

## 2022-04-19 MED ORDER — NALOXONE HCL 0.4 MG/ML IJ SOLN: 0.4000 mg | INTRAMUSCULAR | Status: DC | PRN

## 2022-04-19 MED ORDER — VANCOMYCIN HCL 1750 MG/350ML IV SOLN
1750.0000 mg | Freq: Once | INTRAVENOUS | Status: AC
  Administered 2022-04-19: 1750 mg via INTRAVENOUS
  Filled 2022-04-19: qty 350

## 2022-04-19 NOTE — Progress Notes (Signed)
[facility] night cross cover note:   I was notified by RN of the patient's complaint of left lower extremity pain, persistent in spite of dose of prn acetaminophen.  He was admitted earlier this evening with left lower extremity cellulitis.  I added prn IV fentanyl for pain refractory to acetaminophen.  Refraining from NSAIDs in the setting of concomitant AKI on CKD 4.     Babs Bertin, DO Hospitalist

## 2022-04-19 NOTE — Progress Notes (Signed)
Pharmacy Antibiotic Note  Steven Williams is a 88 y.o. male admitted on 04/18/2022 with cellulitis. PMH of CKD stage IV, chronic anemia, and symptomatic bradycardia with pacer. Presents with ~1 wk of increased left lower leg pain, swelling, and redness. Venous doppler of LLE negative for DVT. Patient is currently afebrile with WBC 8.8. Pharmacy has been consulted for vancomycin dosing.  Plan: Vancomycin 1750 mg IV x1; then Vancomycin 1250 mg IV Q 48 hrs.  Goal AUC 400-550. Expected AUC: 533 SCr used: 3.07  Monitor renal function. Follow microbiology results.  Height: '6\' 2"'$  (188 cm) Weight: 82.1 kg (181 lb) IBW/kg (Calculated) : 82.2 kg  Temp (24hrs), Avg:97.8 F (36.6 C), Min:97.7 F (36.5 C), Max:97.9 F (36.6 C)  Recent Labs  Lab 04/18/22 1839 04/19/22 0223  WBC 10.2 8.8  CREATININE 3.27* 3.07*  LATICACIDVEN 0.8  --     Estimated Creatinine Clearance: 18.6 mL/min (A) (by C-G formula based on SCr of 3.07 mg/dL (H)).    Allergies  Allergen Reactions   Iodinated Contrast Media Rash   Antimicrobials this admission: Vancomycin 8/13 >>  Ceftriaxone 8/13 >> (8/20) Zosyn 8/12 x1  Microbiology results: 8/12 BCx: pending  Thank you for allowing pharmacy to be a part of this patient's care.  Jeneen Rinks, Pharm.D PGY1 Pharmacy Resident 04/19/2022 11:01 AM

## 2022-04-19 NOTE — Progress Notes (Addendum)
Off the unit for ultrasound at this time.   Yehuda Mao, LPN

## 2022-04-19 NOTE — Progress Notes (Addendum)
Arrived to the unit, oriented to unit and assigned room. Upon assessment noted tourniquet to right upper arm. Blanchable redness to area. Tourniquet removed,  Admission and assessments complete.   Yehuda Mao, LPN

## 2022-04-19 NOTE — Progress Notes (Signed)
Patient returned to unit from ultrasound.   Yehuda Mao, LPN

## 2022-04-19 NOTE — Progress Notes (Signed)
PROGRESS NOTE    Steven Williams  XNA:[id number] DOB: [birth year] DOA: 04/18/2022 PCP: Ginger Organ., MD    Brief Narrative:  Steven Williams is a pleasant 88 y.o. male with medical history significant for symptomatic bradycardia with pacer, CKD stage IV, chronic anemia and thrombocytopenia, thoracic aortic aneurysm followed by CT surgery, and paroxysmal atrial fibrillation off of anticoagulation for the past month due to severe epistaxis, now presenting to the emergency department with worsening left lower leg pain, swelling, and erythema   Venous Doppler of the left lower extremity is negative for DVT   Blood cultures were collected in the ED and the patient was given a liter of saline and Zosyn.  8/13 c/o pain at heel of Rt leg. Not much po intake. Will consult RD  Consultants:    Procedures:   Antimicrobials:      Subjective: No sob, cp, dizziness  Objective: Vitals:   04/18/22 2347 04/19/22 0450 04/19/22 0500 04/19/22 0902  BP: (!) 117/47 (!) 91/55  (!) 92/41  Pulse: 77 75  75  Resp: '16 16  18  '$ Temp: 97.8 F (36.6 C) 97.8 F (36.6 C)  97.9 F (36.6 C)  TempSrc: Oral Oral  Oral  SpO2: 98% 99%  (!) 87%  Weight:   82.1 kg   Height:        Intake/Output Summary (Last 24 hours) at 04/19/2022 0923 Last data filed at 04/19/2022 0725 Gross per 24 hour  Intake 49 ml  Output 800 ml  Net -751 ml   Filed Weights   04/18/22 2347 04/19/22 0500  Weight: 82.1 kg 82.1 kg    Examination: Calm, NAD Cta no w/r Reg s1/s2 no gallop Soft benign +bs LLE erythema, warm to touch, ttp.  Awake and alert Mood and affect appropriate in current setting     Data Reviewed: I have personally reviewed following labs and imaging studies  CBC: Recent Labs  Lab 04/18/22 1839 04/19/22 0223  WBC 10.2 8.8  HGB 9.5* 8.7*  HCT 28.1* 26.1*  MCV 99.6 100.8*  PLT 113* 542*   Basic Metabolic Panel: Recent Labs  Lab 04/18/22 1839 04/19/22 0223  NA 135 136  K 3.2* 3.2*  CL  102 105  CO2 20* 19*  GLUCOSE 109* 132*  BUN 101* 94*  CREATININE 3.27* 3.07*  CALCIUM 9.3 8.7*  MG  --  2.2   GFR: Estimated Creatinine Clearance: 18.6 mL/min (A) (by C-G formula based on SCr of 3.07 mg/dL (H)). Liver Function Tests: Recent Labs  Lab 04/18/22 1839  AST 32  ALT 23  ALKPHOS 200*  BILITOT 1.1  PROT 6.3*  ALBUMIN 3.1*   No results for input(s): "LIPASE", "AMYLASE" in the last 168 hours. No results for input(s): "AMMONIA" in the last 168 hours. Coagulation Profile: Recent Labs  Lab 04/18/22 2035  INR 1.3*   Cardiac Enzymes: No results for input(s): "CKTOTAL", "CKMB", "CKMBINDEX", "TROPONINI" in the last 168 hours. BNP (last 3 results) No results for input(s): "PROBNP" in the last 8760 hours. HbA1C: No results for input(s): "HGBA1C" in the last 72 hours. CBG: No results for input(s): "GLUCAP" in the last 168 hours. Lipid Profile: No results for input(s): "CHOL", "HDL", "LDLCALC", "TRIG", "CHOLHDL", "LDLDIRECT" in the last 72 hours. Thyroid Function Tests: No results for input(s): "TSH", "T4TOTAL", "FREET4", "T3FREE", "THYROIDAB" in the last 72 hours. Anemia Panel: No results for input(s): "VITAMINB12", "FOLATE", "FERRITIN", "TIBC", "IRON", "RETICCTPCT" in the last 72 hours. Sepsis Labs: Recent Labs  Lab 04/18/22 1839  LATICACIDVEN 0.8    Recent Results (from the past 240 hour(s))  Blood culture (routine x 2)     Status: None (Preliminary result)   Collection Time: 04/18/22  7:00 PM   Specimen: BLOOD  Result Value Ref Range Status   Specimen Description BLOOD RIGHT ANTECUBITAL  Final   Special Requests   Final    BOTTLES DRAWN AEROBIC AND ANAEROBIC Blood Culture adequate volume   Culture   Final    NO GROWTH < 12 HOURS Performed at [facility], [address] [place], [place] [address]    Report Status PENDING  Incomplete  Blood culture (routine x 2)     Status: None (Preliminary result)   Collection Time: 04/18/22  7:18 PM    Specimen: BLOOD RIGHT FOREARM  Result Value Ref Range Status   Specimen Description BLOOD RIGHT FOREARM  Final   Special Requests   Final    BOTTLES DRAWN AEROBIC AND ANAEROBIC Blood Culture results may not be optimal due to an excessive volume of blood received in culture bottles   Culture   Final    NO GROWTH < 12 HOURS Performed at [facility], [place] [address], [place], [place] [address]    Report Status PENDING  Incomplete         Radiology Studies: US RENAL  Result Date: 04/19/2022 CLINICAL DATA:  Acute renal failure, stage IV chronic renal disease. EXAM: RENAL / URINARY TRACT ULTRASOUND COMPLETE COMPARISON:  None Available. FINDINGS: Right Kidney: Renal measurements: 9.6 cm x 4.4 cm x 4.5 cm = volume: 99.2 mL. Mild, diffusely increased echogenicity of the renal parenchyma is noted. No mass or hydronephrosis visualized. Left Kidney: Renal measurements: 11.0 cm x 5.5 cm x 5.0 cm = volume: 158.0 mL. Mild, diffusely increased echogenicity of the renal parenchyma is noted. No mass or hydronephrosis visualized. Bladder: Appears normal for degree of bladder distention. Other: None. IMPRESSION: Bilateral mildly echogenic kidneys which may be secondary to medical renal disease. Electronically Signed   By: Virgina Norfolk M.D.   On: 04/19/2022 02:01   VAS [place] LOWER EXTREMITY VENOUS (DVT) (ONLY [facility] & [facility])  Result Date: 04/18/2022  Lower Venous DVT Study Patient Name:  Steven Williams  Date of Exam:   04/18/2022 Medical Rec #: [id number]      Accession #:    [id number] Date of Birth: 1930/08/10      Patient Gender: M Patient Age:   88 years Exam Location:  [facility] Procedure:      VAS [place] LOWER EXTREMITY VENOUS (DVT) Referring Phys: Herbie Baltimore PATERSON --------------------------------------------------------------------------------  Indications: Pain, Swelling, and Erythema.  Limitations: Patient unable to tolerate compressions secondary to pain. Comparison Study: No prior study on file  Performing Technologist: Sharion Dove RVS  Examination Guidelines: A complete evaluation includes B-mode imaging, spectral Doppler, color Doppler, and power Doppler as needed of all accessible portions of each vessel. Bilateral testing is considered an integral part of a complete examination. Limited examinations for reoccurring indications may be performed as noted. The reflux portion of the exam is performed with the patient in reverse Trendelenburg.  +-----+---------------+---------+-----------+-------------------+--------------+ RIGHTCompressibilityPhasicitySpontaneityProperties         Thrombus Aging +-----+---------------+---------+-----------+-------------------+--------------+ CFV  Full                               Pulsatile waveforms               +-----+---------------+---------+-----------+-------------------+--------------+   +--------+---------------+---------+-----------+----------------+-------------+  LEFT    CompressibilityPhasicitySpontaneityProperties      Thrombus                                                                 Aging         +--------+---------------+---------+-----------+----------------+-------------+ CFV     Full                               Pulsatile                                                                waveforms                     +--------+---------------+---------+-----------+----------------+-------------+ SFJ     Full                                                             +--------+---------------+---------+-----------+----------------+-------------+ FV Prox Full                               Pulsatile                                                                waveforms                     +--------+---------------+---------+-----------+----------------+-------------+ FV Mid                                     Pulsatile                                                                 waveforms                     +--------+---------------+---------+-----------+----------------+-------------+ FV                                         Pulsatile                     Distal  waveforms                     +--------+---------------+---------+-----------+----------------+-------------+ PFV     Full                               Pulsatile                                                                waveforms                     +--------+---------------+---------+-----------+----------------+-------------+ POP                                        Pulsatile                                                                waveforms                     +--------+---------------+---------+-----------+----------------+-------------+ PTV     Full                                                             +--------+---------------+---------+-----------+----------------+-------------+ PERO    Full                                                             +--------+---------------+---------+-----------+----------------+-------------+    Summary: RIGHT: - No evidence of common femoral vein obstruction. Pulsatile waveforms  LEFT: - There is no evidence of deep vein thrombosis in the lower extremity.  - No cystic structure found in the popliteal fossa. Pulsatile waveforms.  *See table(s) above for measurements and observations.    Preliminary         Scheduled Meds:  allopurinol  100 mg Oral Daily   carvedilol  6.25 mg Oral BID WC   [START ON 04/20/2022] digoxin  0.125 mg Oral Q M,W,F   heparin  5,000 Units Subcutaneous Q8H   levothyroxine  100 mcg Oral Q0600   pantoprazole  40 mg Oral QAC breakfast   polyvinyl alcohol  1 drop Both Eyes TID AC & HS   pregabalin  75 mg Oral QHS   rOPINIRole  2 mg Oral QHS   tamsulosin  0.4 mg Oral Daily   Continuous Infusions:  cefTRIAXone (ROCEPHIN)  IV 1 g  (04/19/22 0203)    Assessment & Plan:   Principal Problem:   Cellulitis of left leg Active Problems:   Recurrent epistaxis   Chronic anemia   Essential  hypertension   Hypothyroidism   Paroxysmal atrial fibrillation (HCC)   Rheumatoid arthritis (HCC)   Thrombocytopenia (HCC)   Acute renal failure superimposed on stage 4 chronic kidney disease (HCC)   Chronic diastolic CHF (congestive heart failure) ([place])  . Left leg cellulitis  - Presents with ~1 wk of increased left lower leg pain, swelling, and redness  - He is not septic on admission  - Venous US negative for DVT  - Blood cultures were collected in ED and he was started on antibiotics  8/13 will add vancomycin to regimen since not much better Consider ID consult if no improvement with adding vancomycin   2. AKI superimposed on CKD IV  Likely prerenal Poor po intake Cr mildly better Start IV fluids at 50 MLS per hour   3. PAF  - Has been off of warfarin for ~1 month d/t severe epistaxis  - Continue digoxin, continue cardiology follow-up as planned to discuss resumption of anticoagulation   8/13 heart rate stable   4. HTN  On low side continue to monitor on Coreg   5. Chronic HFpEF  - Appears compensated  - Hold Lasix while hydrating, monitor volume status   6. Anemia; thrombocytopenia  - Appears stable, no bleeding   Monitor closely    DVT prophylaxis: SCD due to thrombocytopenia Code Status: DNR Family Communication: None at bedside Disposition Plan: Home Status is: Inpatient Remains inpatient appropriate because: IV treatment        LOS: 1 day   Time spent: 35 minutes    Nolberto Hanlon, MD [facility] Pager [phone]  If 7PM-7AM, please contact night-coverage 04/19/2022, 9:23 AM

## 2022-04-20 DIAGNOSIS — L03116 Cellulitis of left lower limb: Secondary | ICD-10-CM | POA: Diagnosis not present

## 2022-04-20 LAB — BASIC METABOLIC PANEL
Anion gap: 12 (ref 5–15)
BUN: 80 mg/dL — ABNORMAL HIGH (ref 8–23)
CO2: 18 mmol/L — ABNORMAL LOW (ref 22–32)
Calcium: 8.7 mg/dL — ABNORMAL LOW (ref 8.9–10.3)
Chloride: 108 mmol/L (ref 98–111)
Creatinine, Ser: 2.76 mg/dL — ABNORMAL HIGH (ref 0.61–1.24)
GFR, Estimated: 21 mL/min — ABNORMAL LOW (ref >60)
Glucose, Bld: 100 mg/dL — ABNORMAL HIGH (ref 70–99)
Potassium: 3.4 mmol/L — ABNORMAL LOW (ref 3.5–5.1)
Sodium: 138 mmol/L (ref 135–145)

## 2022-04-20 LAB — CBC
HCT: 25 % — ABNORMAL LOW (ref 39.0–52.0)
Hemoglobin: 8.5 g/dL — ABNORMAL LOW (ref 13.0–17.0)
MCH: 33.5 pg (ref 26.0–34.0)
MCHC: 34 g/dL (ref 30.0–36.0)
MCV: 98.4 fL (ref 80.0–100.0)
Platelets: 125 10*3/uL — ABNORMAL LOW (ref 150–400)
RBC: 2.54 MIL/uL — ABNORMAL LOW (ref 4.22–5.81)
RDW: 13.9 % (ref 11.5–15.5)
WBC: 10.3 10*3/uL (ref 4.0–10.5)
nRBC: 0 % (ref 0.0–0.2)

## 2022-04-20 MED ORDER — POTASSIUM CHLORIDE CRYS ER 20 MEQ PO TBCR
40.0000 meq | EXTENDED_RELEASE_TABLET | Freq: Once | ORAL | Status: AC
  Administered 2022-04-20: 40 meq via ORAL
  Filled 2022-04-20: qty 2

## 2022-04-20 MED ORDER — OXYCODONE HCL 5 MG PO TABS
5.0000 mg | ORAL_TABLET | ORAL | Status: DC | PRN
  Administered 2022-04-20 – 2022-04-27 (×15): 5 mg via ORAL
  Filled 2022-04-20 (×15): qty 1

## 2022-04-20 MED ORDER — OXYCODONE HCL 5 MG PO TABS
5.0000 mg | ORAL_TABLET | Freq: Four times a day (QID) | ORAL | Status: DC | PRN
  Administered 2022-04-20: 5 mg via ORAL
  Filled 2022-04-20: qty 1

## 2022-04-20 MED ORDER — SODIUM CHLORIDE 0.9 % IV SOLN: INTRAVENOUS | Status: AC

## 2022-04-20 NOTE — Progress Notes (Signed)
PROGRESS NOTE    Steven Williams  SWH:[id number] DOB: [birth year] DOA: 04/18/2022 PCP: Ginger Organ., MD  Steven Williams is a pleasant 88 y.o. male with medical history significant for symptomatic bradycardia with pacer, CKD stage IV, chronic anemia and thrombocytopenia, thoracic aortic aneurysm followed by CT surgery, and paroxysmal atrial fibrillation off of anticoagulation for the past month due to severe epistaxis, presented to the ED with worsening left lower leg pain, swelling, and erythema over the past few days accompanied by fatigue and chills. -In the ED his creatinine was 3.2, BUN of 101, Dopplers were negative for DVT, started on antibiotics for cellulitis   Subjective: -Feels better overall, still has some pain and discomfort in his left leg, denies any nausea or vomiting, p.o. intake is improving  Assessment and Plan:  1. Left leg cellulitis  - Presents with ~1 wk of increased left lower leg pain, swelling, and redness  - Venous US negative for DVT  -Clinically improving, blood cultures are negative -Continue ceftriaxone, will discontinue vancomycin in the setting of AKI/CKD -Ambulate, PT OT eval   2. AKI superimposed on CKD IV  - BUN 101 and SCr 3.27 in ED, up from 48 & 2.59 three weeks ago,, baseline appears to be 2.5, renal ultrasound is negative -Creatinine improving with hydration, Lasix on hold,, continue NS at 50 cc/h for 12 more hours   3. PAF  - Has been off of warfarin for ~1 month d/t severe recurrent epistaxis  - Continue digoxin, continue cardiology follow-up as planned to discuss resumption of anticoagulation     4. HTN  - Continue Coreg     5. Chronic HFpEF  - Appears compensated  -Lasix on hold, resume in 1 to 2 days   6. Anemia; thrombocytopenia  - Appears stable, no bleeding    Hypokalemia -Replace     DVT prophylaxis: sq heparin  Code Status: DNR Family Communication: Discussed with patient in detail no family at bedside  Consultants:     Procedures:   Antimicrobials:    Objective: Vitals:   04/19/22 1658 04/19/22 2029 04/20/22 0100 04/20/22 0849  BP: 113/81 (!) 120/55  (!) 91/57  Pulse: 96 97  72  Resp: '18 18  17  '$ Temp: 98 F (36.7 C) 98.1 F (36.7 C)  (!) 97.5 F (36.4 C)  TempSrc: Oral Oral  Oral  SpO2: 99% 100%  99%  Weight:   83.8 kg   Height:        Intake/Output Summary (Last 24 hours) at 04/20/2022 1032 Last data filed at 04/20/2022 0600 Gross per 24 hour  Intake 222 ml  Output 1700 ml  Net -1478 ml   Filed Weights   04/18/22 2347 04/19/22 0500 04/20/22 0100  Weight: 82.1 kg 82.1 kg 83.8 kg    Examination:  General exam: Chronically ill elderly male sitting up, AAOx3 CVS: S1-S2, regular rhythm Lungs: Decreased breath sounds the bases Abdomen: Soft, nontender, bowel sounds present Extremities: Left leg with swelling mild erythema and warmth superimposed on chronic venous stasis changes Psychiatry:  Mood & affect appropriate.     Data Reviewed:   CBC: Recent Labs  Lab 04/18/22 1839 04/19/22 0223 04/20/22 0703  WBC 10.2 8.8 10.3  HGB 9.5* 8.7* 8.5*  HCT 28.1* 26.1* 25.0*  MCV 99.6 100.8* 98.4  PLT 113* 107* 665*   Basic Metabolic Panel: Recent Labs  Lab 04/18/22 1839 04/19/22 0223 04/20/22 0703  NA 135 136 138  K 3.2* 3.2* 3.4*  CL 102  105 108  CO2 20* 19* 18*  GLUCOSE 109* 132* 100*  BUN 101* 94* 80*  CREATININE 3.27* 3.07* 2.76*  CALCIUM 9.3 8.7* 8.7*  MG  --  2.2  --    GFR: Estimated Creatinine Clearance: 20.7 mL/min (A) (by C-G formula based on SCr of 2.76 mg/dL (H)). Liver Function Tests: Recent Labs  Lab 04/18/22 1839  AST 32  ALT 23  ALKPHOS 200*  BILITOT 1.1  PROT 6.3*  ALBUMIN 3.1*   No results for input(s): "LIPASE", "AMYLASE" in the last 168 hours. No results for input(s): "AMMONIA" in the last 168 hours. Coagulation Profile: Recent Labs  Lab 04/18/22 2035  INR 1.3*   Cardiac Enzymes: No results for input(s): "CKTOTAL", "CKMB",  "CKMBINDEX", "TROPONINI" in the last 168 hours. BNP (last 3 results) No results for input(s): "PROBNP" in the last 8760 hours. HbA1C: No results for input(s): "HGBA1C" in the last 72 hours. CBG: No results for input(s): "GLUCAP" in the last 168 hours. Lipid Profile: No results for input(s): "CHOL", "HDL", "LDLCALC", "TRIG", "CHOLHDL", "LDLDIRECT" in the last 72 hours. Thyroid Function Tests: No results for input(s): "TSH", "T4TOTAL", "FREET4", "T3FREE", "THYROIDAB" in the last 72 hours. Anemia Panel: No results for input(s): "VITAMINB12", "FOLATE", "FERRITIN", "TIBC", "IRON", "RETICCTPCT" in the last 72 hours. Urine analysis:    Component Value Date/Time   COLORURINE YELLOW 04/19/2022 1500   APPEARANCEUR HAZY (A) 04/19/2022 1500   LABSPEC 1.014 04/19/2022 1500   PHURINE 5.0 04/19/2022 1500   GLUCOSEU NEGATIVE 04/19/2022 1500   HGBUR NEGATIVE 04/19/2022 1500   BILIRUBINUR NEGATIVE 04/19/2022 1500   KETONESUR NEGATIVE 04/19/2022 1500   PROTEINUR NEGATIVE 04/19/2022 1500   NITRITE NEGATIVE 04/19/2022 1500   LEUKOCYTESUR NEGATIVE 04/19/2022 1500   Sepsis Labs: '@LABRCNTIP'$ (procalcitonin:4,lacticidven:4)  ) Recent Results (from the past 240 hour(s))  Blood culture (routine x 2)     Status: None (Preliminary result)   Collection Time: 04/18/22  7:00 PM   Specimen: BLOOD  Result Value Ref Range Status   Specimen Description BLOOD RIGHT ANTECUBITAL  Final   Special Requests   Final    BOTTLES DRAWN AEROBIC AND ANAEROBIC Blood Culture adequate volume   Culture   Final    NO GROWTH 2 DAYS Performed at [facility], [place] [address], [place], [place] [address]    Report Status PENDING  Incomplete  Blood culture (routine x 2)     Status: None (Preliminary result)   Collection Time: 04/18/22  7:18 PM   Specimen: BLOOD RIGHT FOREARM  Result Value Ref Range Status   Specimen Description BLOOD RIGHT FOREARM  Final   Special Requests   Final    BOTTLES DRAWN AEROBIC AND ANAEROBIC  Blood Culture results may not be optimal due to an excessive volume of blood received in culture bottles   Culture   Final    NO GROWTH 2 DAYS Performed at [facility], [place] [address], [place], [place] [address]    Report Status PENDING  Incomplete     Radiology Studies: VAS [place] LOWER EXTREMITY VENOUS (DVT) (ONLY [facility] & [facility])  Result Date: 04/19/2022  Lower Venous DVT Study Patient Name:  DAMARIAN PRIOLA  Date of Exam:   04/18/2022 Medical Rec #: [id number]      Accession #:    [id number] Date of Birth: August 10, 1930      Patient Gender: M Patient Age:   89 years Exam Location:  [facility] Procedure:      VAS [place] LOWER  EXTREMITY VENOUS (DVT) Referring Phys: ROBERT PATERSON --------------------------------------------------------------------------------  Indications: Pain, Swelling, and Erythema.  Limitations: Patient unable to tolerate compressions secondary to pain. Comparison Study: No prior study on file Performing Technologist: Sharion Dove RVS  Examination Guidelines: A complete evaluation includes B-mode imaging, spectral Doppler, color Doppler, and power Doppler as needed of all accessible portions of each vessel. Bilateral testing is considered an integral part of a complete examination. Limited examinations for reoccurring indications may be performed as noted. The reflux portion of the exam is performed with the patient in reverse Trendelenburg.  +-----+---------------+---------+-----------+-------------------+--------------+ RIGHTCompressibilityPhasicitySpontaneityProperties         Thrombus Aging +-----+---------------+---------+-----------+-------------------+--------------+ CFV  Full                               Pulsatile waveforms               +-----+---------------+---------+-----------+-------------------+--------------+   +--------+---------------+---------+-----------+----------------+-------------+ LEFT    CompressibilityPhasicitySpontaneityProperties       Thrombus                                                                 Aging         +--------+---------------+---------+-----------+----------------+-------------+ CFV     Full                               Pulsatile                                                                waveforms                     +--------+---------------+---------+-----------+----------------+-------------+ SFJ     Full                                                             +--------+---------------+---------+-----------+----------------+-------------+ FV Prox Full                               Pulsatile                                                                waveforms                     +--------+---------------+---------+-----------+----------------+-------------+ FV Mid                                     Pulsatile  waveforms                     +--------+---------------+---------+-----------+----------------+-------------+ FV                                         Pulsatile                     Distal                                     waveforms                     +--------+---------------+---------+-----------+----------------+-------------+ PFV     Full                               Pulsatile                                                                waveforms                     +--------+---------------+---------+-----------+----------------+-------------+ POP                                        Pulsatile                                                                waveforms                     +--------+---------------+---------+-----------+----------------+-------------+ PTV     Full                                                             +--------+---------------+---------+-----------+----------------+-------------+ PERO     Full                                                             +--------+---------------+---------+-----------+----------------+-------------+     Summary: RIGHT: - No evidence of common femoral vein obstruction. Pulsatile waveforms  LEFT: - There is no evidence of deep vein thrombosis in the lower extremity.  - No cystic structure found in the popliteal fossa. Pulsatile waveforms.  *See table(s) above for measurements and observations. Electronically signed by Orlie Pollen on 04/19/2022 at 10:43:30 AM.    Final    US RENAL  Result Date: 04/19/2022 CLINICAL  DATA:  Acute renal failure, stage IV chronic renal disease. EXAM: RENAL / URINARY TRACT ULTRASOUND COMPLETE COMPARISON:  None Available. FINDINGS: Right Kidney: Renal measurements: 9.6 cm x 4.4 cm x 4.5 cm = volume: 99.2 mL. Mild, diffusely increased echogenicity of the renal parenchyma is noted. No mass or hydronephrosis visualized. Left Kidney: Renal measurements: 11.0 cm x 5.5 cm x 5.0 cm = volume: 158.0 mL. Mild, diffusely increased echogenicity of the renal parenchyma is noted. No mass or hydronephrosis visualized. Bladder: Appears normal for degree of bladder distention. Other: None. IMPRESSION: Bilateral mildly echogenic kidneys which may be secondary to medical renal disease. Electronically Signed   By: Virgina Norfolk M.D.   On: 04/19/2022 02:01     Scheduled Meds:  allopurinol  100 mg Oral Daily   carvedilol  6.25 mg Oral BID WC   digoxin  0.125 mg Oral Q M,W,F   heparin  5,000 Units Subcutaneous Q8H   levothyroxine  100 mcg Oral Q0600   pantoprazole  40 mg Oral QAC breakfast   polyvinyl alcohol  1 drop Both Eyes TID AC & HS   pregabalin  75 mg Oral QHS   rOPINIRole  2 mg Oral QHS   tamsulosin  0.4 mg Oral Daily   Continuous Infusions:  sodium chloride 50 mL/hr at 04/19/22 0955   cefTRIAXone (ROCEPHIN)  IV 1 g (04/20/22 0041)     LOS: 2 days    Time spent: 24mn  PDomenic Polite MD [facility]   04/20/2022, 10:32 AM

## 2022-04-21 ENCOUNTER — Inpatient Hospital Stay (HOSPITAL_COMMUNITY): Payer: Medicare Other

## 2022-04-21 DIAGNOSIS — L03116 Cellulitis of left lower limb: Secondary | ICD-10-CM | POA: Diagnosis not present

## 2022-04-21 DIAGNOSIS — N184 Chronic kidney disease, stage 4 (severe): Secondary | ICD-10-CM | POA: Diagnosis not present

## 2022-04-21 DIAGNOSIS — N179 Acute kidney failure, unspecified: Secondary | ICD-10-CM | POA: Diagnosis not present

## 2022-04-21 LAB — BASIC METABOLIC PANEL
Anion gap: 9 (ref 5–15)
BUN: 68 mg/dL — ABNORMAL HIGH (ref 8–23)
CO2: 18 mmol/L — ABNORMAL LOW (ref 22–32)
Calcium: 8.9 mg/dL (ref 8.9–10.3)
Chloride: 112 mmol/L — ABNORMAL HIGH (ref 98–111)
Creatinine, Ser: 2.4 mg/dL — ABNORMAL HIGH (ref 0.61–1.24)
GFR, Estimated: 25 mL/min — ABNORMAL LOW (ref >60)
Glucose, Bld: 107 mg/dL — ABNORMAL HIGH (ref 70–99)
Potassium: 3.9 mmol/L (ref 3.5–5.1)
Sodium: 139 mmol/L (ref 135–145)

## 2022-04-21 NOTE — Hospital Course (Addendum)
Steven Williams is a pleasant 88 y.o. male with medical history significant for symptomatic bradycardia with pacer, CKD stage IV, chronic anemia and thrombocytopenia, thoracic aortic aneurysm followed by CT surgery, and paroxysmal atrial fibrillation off of anticoagulation for the past month due to severe epistaxis, presented to the ED with worsening left lower leg pain, swelling, and erythema over the past few days accompanied by fatigue and chills. In the ED his creatinine was 3.2, BUN of 101, Dopplers were negative for DVT. He was started on antibiotics for cellulitis. Due to ongoing pain in his leg he also underwent assessment with ABIs.  This was grossly abnormal and he underwent vascular surgery consultation followed by angiogram of the left lower extremity.  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  He was started on aspirin and statin.

## 2022-04-21 NOTE — Evaluation (Signed)
Physical Therapy Evaluation Patient Details Name: Steven Williams MRN: [id number] DOB: [birth year] Today's Date: 04/21/2022  History of Present Illness  88 yo male presents to ED On 8/12 with LLE pain and swelling due to cellulitis, negative for DVT. PMH includes AAA; BPH; COPD; HTN; HLD; pacemaker placement; hypothyroidism; MDS; afib on Coumadin; recurrent epistaxis; and RA presenting with epistaxis.  Clinical Impression   Pt presents with generalized weakness, impaired standing balance, decreased activity tolerance, and significant difficulty with transfer level mobility at this time. Pt to benefit from acute PT to address deficits. Pt requiring mod assist for transfer OOB to chair today, and required significant lowering assist to safely reach recliner. PT discussed d/c options, including ST-SNF or increased assist at ALF for all mobility and ADLs. Pt plans to discuss with his ALF, family, and CSM. PT to progress mobility as tolerated, and will continue to follow acutely.         Recommendations for follow up therapy are one component of a multi-disciplinary discharge planning process, led by the attending physician.  Recommendations may be updated based on patient status, additional functional criteria and insurance authorization.  Follow Up Recommendations Skilled nursing-short term rehab (<3 hours/day) Can patient physically be transported by private vehicle: Yes    Assistance Recommended at Discharge Frequent or constant Supervision/Assistance  Patient can return home with the following  A lot of help with walking and/or transfers;A lot of help with bathing/dressing/bathroom    Equipment Recommendations None recommended by PT  Recommendations for Other Services       Functional Status Assessment Patient has had a recent decline in their functional status and demonstrates the ability to make significant improvements in function in a reasonable and predictable amount of time.      Precautions / Restrictions Precautions Precautions: Fall Precaution Comments: fecal and urinary incontinence Restrictions Weight Bearing Restrictions: No      Mobility  Bed Mobility Overal bed mobility: Needs Assistance Bed Mobility: Supine to Sit     Supine to sit: Mod assist, HOB elevated     General bed mobility comments: assist for trunk elevation, LE translation to EOB, very increased time    Transfers Overall transfer level: Needs assistance Equipment used: Rolling walker (2 wheels) Transfers: Sit to/from Stand, Bed to chair/wheelchair/BSC Sit to Stand: Mod assist Stand pivot transfers: Mod assist         General transfer comment: assist for power up, rise, steadying, keeping trunk upright once stepping to recliner. Pt with slow lowering of trunk with each step, PT having to swing pt hips into recliner to safely reach seated surface as pt fatigued.    Ambulation/Gait               General Gait Details: unable  Stairs            Wheelchair Mobility    Modified Rankin (Stroke Patients Only)       Balance Overall balance assessment: Needs assistance Sitting-balance support: No upper extremity supported, Feet supported Sitting balance-Leahy Scale: Fair     Standing balance support: Bilateral upper extremity supported, During functional activity, Reliant on assistive device for balance Standing balance-Leahy Scale: Poor Standing balance comment: mod physical assist for static and dynamic standing                             Pertinent Vitals/Pain Pain Assessment Pain Assessment: Faces Faces Pain Scale: Hurts even more Pain Location: bilat LEs L>R  Pain Descriptors / Indicators: Sore, Discomfort, Grimacing Pain Intervention(s): Limited activity within patient's tolerance, Monitored during session, Repositioned    Home Living Family/patient expects to be discharged to:: Assisted living                 Home Equipment:  Rollator (4 wheels);Cane - single point Additional Comments: ILF: [facility]    Prior Function Prior Level of Function : Independent/Modified Independent             Mobility Comments: pt reports walking with rollator to dining hall, has an attachment on rollator to hold his cane in to maneuver around ALF       Hand Dominance   Dominant Hand: Right    Extremity/Trunk Assessment   Upper Extremity Assessment Upper Extremity Assessment: Defer to OT evaluation    Lower Extremity Assessment Lower Extremity Assessment: Generalized weakness    Cervical / Trunk Assessment Cervical / Trunk Assessment: Normal  Communication   Communication: No difficulties  Cognition Arousal/Alertness: Awake/alert Behavior During Therapy: WFL for tasks assessed/performed Overall Cognitive Status: Within Functional Limits for tasks assessed                                          General Comments General comments (skin integrity, edema, etc.): Callous formation over lateral aspect of L calf, flaking and hemosideran staining bilateral LEs    Exercises     Assessment/Plan    PT Assessment Patient needs continued PT services  PT Problem List Decreased strength;Decreased mobility;Decreased safety awareness;Decreased activity tolerance;Decreased balance;Decreased knowledge of use of DME;Pain;Cardiopulmonary status limiting activity;Decreased skin integrity       PT Treatment Interventions DME instruction;Therapeutic activities;Gait training;Therapeutic exercise;Patient/family education;Balance training;Functional mobility training;Neuromuscular re-education    PT Goals (Current goals can be found in the Care Plan section)  Acute Rehab PT Goals PT Goal Formulation: With patient Time For Goal Achievement: 05/05/22 Potential to Achieve Goals: Good    Frequency Min 3X/week     Co-evaluation               AM-PAC PT "6 Clicks" Mobility  Outcome Measure Help needed  turning from your back to your side while in a flat bed without using bedrails?: A Little Help needed moving from lying on your back to sitting on the side of a flat bed without using bedrails?: A Little Help needed moving to and from a bed to a chair (including a wheelchair)?: A Lot Help needed standing up from a chair using your arms (e.g., wheelchair or bedside chair)?: A Lot Help needed to walk in hospital room?: Total Help needed climbing 3-5 steps with a railing? : Total 6 Click Score: 12    End of Session Equipment Utilized During Treatment: Gait belt Activity Tolerance: Patient limited by fatigue Patient left: in chair;with call bell/phone within reach;with chair alarm set Nurse Communication: Mobility status PT Visit Diagnosis: Other abnormalities of gait and mobility (R26.89);Muscle weakness (generalized) (M62.81)    Time: 6283-1517 PT Time Calculation (min) (ACUTE ONLY): 28 min   Charges:   PT Evaluation $PT Eval Low Complexity: 1 Low PT Treatments $Therapeutic Activity: 8-22 mins      Stacie Glaze, PT DPT Acute Rehabilitation Services Pager [phone]  Office [phone]   Wiseman E Ruffin Pyo 04/21/2022, 12:30 PM

## 2022-04-21 NOTE — NC FL2 (Signed)
[place] LEVEL OF CARE SCREENING TOOL     IDENTIFICATION  Patient Name: Steven Williams Birthdate: 1929/10/17 Sex: male Admission Date (Current Location): 04/18/2022  [facility] and [place] Number:  Herbalist and Address:  [facility] [place] [address] [place], [place] [address]      Provider Number: [id number]  Attending Physician Name and Address:  Dwyane Dee, MD  Relative Name and Phone Number:  Julious Payer - [phone]    Current Level of Care: SNF Recommended Level of Care: [place] Prior Approval Number:    Date Approved/Denied:   PASRR Number:    Discharge Plan: SNF    Current Diagnoses: Patient Active Problem List   Diagnosis Date Noted   Cellulitis of left leg 04/18/2022   Acute renal failure superimposed on stage 4 chronic kidney disease ([place]) 04/18/2022   Chronic diastolic CHF (congestive heart failure) ([place]) 04/18/2022   Epistaxis 01/10/2022   DNR (do not resuscitate) 01/09/2022   Chronic venous hypertension (idiopathic) with ulcer and inflammation of bilateral lower extremity ([place]) 10/18/2021   Hypertensive heart and renal disease 10/18/2021   Lumbar radiculopathy 10/18/2021   Non-pressure chronic ulcer of unspecified part of right lower leg with unspecified severity ([place]) 10/18/2021   Unsteady gait 10/18/2021   Transient ischemic attack 10/18/2021   Pain of right hip joint 02/17/2021   Descending thoracic aortic aneurysm ([place]) 10/23/2020   Hypercoagulable state ([place]) 10/23/2020   Preventative health care 10/23/2020   Anticoagulated 06/26/2020   Nasal septal deviation 06/26/2020   Recurrent epistaxis 06/26/2020   Headache 01/19/2020   Localized edema 12/29/2019   Long term (current) use of anticoagulants 12/25/2019   Dysphagia 07/06/2019   Restless legs syndrome 05/19/2019   Thrombocytopenia ([place]) 11/08/2018   Basal cell carcinoma of scalp 08/26/2018   Atherosclerotic heart  disease of native coronary artery without angina pectoris 08/10/2018   CKD (chronic kidney disease) stage 4, GFR 15-29 ml/min (HCC) 07/28/2018   Chronic anemia 07/27/2018   Benign prostatic hyperplasia without lower urinary tract symptoms 07/27/2018   Carotid artery occlusion 07/27/2018   Essential hypertension 07/27/2018   Gout 07/27/2018   Hyperlipidemia 07/27/2018   Hypothyroidism 07/27/2018   Macular degeneration 07/27/2018   Paroxysmal atrial fibrillation ([place]) 07/27/2018   Polyneuropathy 07/27/2018   Rheumatoid arthritis ([place]) 07/27/2018    Orientation RESPIRATION BLADDER Height & Weight     Self, Time, Situation, Place  Normal Continent Weight: 84 kg Height:  '6\' 2"'$  (188 cm)  BEHAVIORAL SYMPTOMS/MOOD NEUROLOGICAL BOWEL NUTRITION STATUS      Incontinent Diet  AMBULATORY STATUS COMMUNICATION OF NEEDS Skin   Total Care Verbally Normal                       Personal Care Assistance Level of Assistance  Bathing, Feeding, Dressing Bathing Assistance: Limited assistance Feeding assistance: Independent Dressing Assistance: Limited assistance     Functional Limitations Info  Sight, Hearing, Speech Sight Info: Impaired Hearing Info: Adequate Speech Info: Adequate    SPECIAL CARE FACTORS FREQUENCY  PT (By licensed PT), OT (By licensed OT)     PT Frequency: 3-5 x per week OT Frequency: 3-5 x per week            Contractures Contractures Info: Not present    Additional Factors Info  Code Status, Allergies, Psychotropic Code Status Info: DNR Allergies Info: Iodinated contrast media Psychotropic Info: Lyrica         Current  Medications (04/21/2022):  This is the current hospital active medication list Current Facility-Administered Medications  Medication Dose Route Frequency Provider Last Rate Last Admin   acetaminophen (TYLENOL) tablet 650 mg  650 mg Oral Q6H PRN Opyd, Ilene Qua, MD   650 mg at 04/20/22 2671   Or   acetaminophen (TYLENOL) suppository 650  mg  650 mg Rectal Q6H PRN Opyd, Ilene Qua, MD       allopurinol (ZYLOPRIM) tablet 100 mg  100 mg Oral Daily Opyd, Ilene Qua, MD   100 mg at 04/21/22 1011   carvedilol (COREG) tablet 6.25 mg  6.25 mg Oral BID WC Opyd, Ilene Qua, MD   6.25 mg at 04/21/22 1011   cefTRIAXone (ROCEPHIN) 1 g in sodium chloride 0.9 % 100 mL IVPB  1 g Intravenous Q24H Opyd, Ilene Qua, MD 200 mL/hr at 04/21/22 0011 1 g at 04/21/22 0011   digoxin (LANOXIN) tablet 0.125 mg  0.125 mg Oral Q M,W,F Opyd, Ilene Qua, MD   0.125 mg at 04/20/22 0852   heparin injection 5,000 Units  5,000 Units Subcutaneous Q8H Opyd, Ilene Qua, MD   5,000 Units at 04/21/22 1356   levothyroxine (SYNTHROID) tablet 100 mcg  100 mcg Oral Q0600 Vianne Bulls, MD   100 mcg at 04/21/22 0522   naloxone [facility]) injection 0.4 mg  0.4 mg Intravenous PRN Howerter, Justin B, DO       oxyCODONE (Oxy IR/ROXICODONE) immediate release tablet 5 mg  5 mg Oral Q4H PRN Domenic Polite, MD   5 mg at 04/21/22 1011   pantoprazole (PROTONIX) EC tablet 40 mg  40 mg Oral QAC breakfast Opyd, Ilene Qua, MD   40 mg at 04/21/22 1011   polyvinyl alcohol (LIQUIFILM TEARS) 1.4 % ophthalmic solution 1 drop  1 drop Both Eyes TID AC & HS Opyd, Ilene Qua, MD   1 drop at 04/21/22 1357   pregabalin (LYRICA) capsule 75 mg  75 mg Oral QHS Opyd, Ilene Qua, MD   75 mg at 04/20/22 2122   rOPINIRole (REQUIP) tablet 2 mg  2 mg Oral QHS Opyd, Ilene Qua, MD   2 mg at 04/20/22 2122   senna-docusate (Senokot-S) tablet 1 tablet  1 tablet Oral QHS PRN Opyd, Ilene Qua, MD       tamsulosin (FLOMAX) capsule 0.4 mg  0.4 mg Oral Daily Opyd, Ilene Qua, MD   0.4 mg at 04/21/22 1011     Discharge Medications: Please see discharge summary for a list of discharge medications.  Relevant Imaging Results:  Relevant Lab Results:   Additional Information    Curlene Labrum, RN

## 2022-04-21 NOTE — Consult Note (Signed)
WOC Nurse Consult Note: Reason for Consult:LLE wound Wound type: no open wound; scabbed healing area; no weeping Pressure Injury POA: NA Discussed with bedside nurse after reviewing chart and nursing flow sheet; she reports cellulitis with skin changes consistent with venous stasis, no open wounds. Nursing flow sheets also indicate no open wounds.  No weeping noted Dressing procedure/placement/frequency: No topical care needed   No ABIs on the chart. If long term management of venous stasis desired would need ABI and once no arterial compromise determined would need to compression stockings fit. Would consider referral as an outpatient to medical supply store that can fit patient for long term management with compression.    Discussed POC with bedside nurse.  Re consult if needed, will not follow at this time. Thanks  Afnan Cadiente [employer], RN,CWOCN, CNS, [place] [phone])

## 2022-04-21 NOTE — Progress Notes (Signed)
Progress Note    Steven Williams   HBZ:[id number]  DOB: 1930/06/04  DOA: 04/18/2022     3 PCP: Ginger Organ., MD  Initial CC: left foot/leg pain  Hospital Course: Steven Williams is a pleasant 88 y.o. male with medical history significant for symptomatic bradycardia with pacer, CKD stage IV, chronic anemia and thrombocytopenia, thoracic aortic aneurysm followed by CT surgery, and paroxysmal atrial fibrillation off of anticoagulation for the past month due to severe epistaxis, presented to the ED with worsening left lower leg pain, swelling, and erythema over the past few days accompanied by fatigue and chills. In the ED his creatinine was 3.2, BUN of 101, Dopplers were negative for DVT. He was started on antibiotics for cellulitis.  Interval History:  No events overnight.  Still complaining of ongoing pain in his left foot with ongoing weeping noted from top surface.  He has not been able to bear much weight as well nor ambulate well.   Assessment and Plan:   Left leg cellulitis  - Presents with ~1 wk of increased left lower leg pain, swelling, and redness  - Venous US negative for DVT  - still very TTP with ongoing weeping - check CT today (unable for MRI due to pacer and avoiding contrast in setting of recent worsened renal fxn) - continue rocephin for now  Physical deconditioning - Likely worsened in setting of cellulitis and inability to bear weight well - Continue working with PT as able - So far SNF recommended   AKI superimposed on CKD IV  - BUN 101 and SCr 3.27 in ED, up from 48 & 2.59 three weeks ago, baseline appears to be 2.5, renal ultrasound is negative -Creatinine improving with hydration, Lasix on hold; s/p IVF   PAF  - Has been off of warfarin for ~1 month d/t severe recurrent epistaxis  - Continue digoxin, continue cardiology follow-up as planned to discuss resumption of anticoagulation     HTN  - Continue Coreg     Chronic HFpEF  - Appears compensated   -Lasix on hold   Anemia; thrombocytopenia  - Appears stable, no bleeding     Hypokalemia -Replace as needed    Old records reviewed in assessment of this patient  Antimicrobials:   DVT prophylaxis:  Place and maintain sequential compression device Start: 04/19/22 1040 heparin injection 5,000 Units Start: 04/18/22 2200   Code Status:   Code Status: DNR  Mobility Assessment (last 72 hours)     Mobility Assessment     Row Name 04/21/22 1226 04/18/22 2335         Does patient have an order for bedrest or is patient medically unstable -- Yes- Bedfast (Level 1) - Complete      What is the highest level of mobility based on the progressive mobility assessment? Level 3 (Stands with assist) - Balance while standing  and cannot march in place --               Barriers to discharge: none Disposition Plan:  SNF Status is: Inpt  Objective: Blood pressure (!) 129/53, pulse (!) 106, temperature 97.6 F (36.4 C), temperature source Oral, resp. rate 18, height '6\' 2"'$  (1.88 m), weight 84 kg, SpO2 96 %.  Examination:  Physical Exam Constitutional:      General: He is not in acute distress.    Appearance: Normal appearance. He is ill-appearing.  HENT:     Head: Normocephalic and atraumatic.     Mouth/Throat:  Mouth: Mucous membranes are moist.  Eyes:     Extraocular Movements: Extraocular movements intact.  Cardiovascular:     Rate and Rhythm: Normal rate and regular rhythm.  Pulmonary:     Effort: Pulmonary effort is normal. No respiratory distress.     Breath sounds: Normal breath sounds. No wheezing.  Abdominal:     General: Bowel sounds are normal. There is no distension.     Palpations: Abdomen is soft.     Tenderness: There is no abdominal tenderness.  Musculoskeletal:        General: Swelling present.     Cervical back: Normal range of motion and neck supple.     Comments: 2+ LLE edema with TTP noted to light palpation. Open wound on top of left foot with  weeping of serous fluid noted   Skin:    Comments: Diffuse flaking of skin in B/L LE  Neurological:     General: No focal deficit present.     Mental Status: He is alert.  Psychiatric:        Mood and Affect: Mood normal.      Consultants:    Procedures:    Data Reviewed: Results for orders placed or performed during the hospital encounter of 04/18/22 (from the past 24 hour(s))  Basic metabolic panel     Status: Abnormal   Collection Time: 04/21/22  4:34 AM  Result Value Ref Range   Sodium 139 135 - 145 mmol/L   Potassium 3.9 3.5 - 5.1 mmol/L   Chloride 112 (H) 98 - 111 mmol/L   CO2 18 (L) 22 - 32 mmol/L   Glucose, Bld 107 (H) 70 - 99 mg/dL   BUN 68 (H) 8 - 23 mg/dL   Creatinine, Ser 2.40 (H) 0.61 - 1.24 mg/dL   Calcium 8.9 8.9 - 10.3 mg/dL   GFR, Estimated 25 (L) >60 mL/min   Anion gap 9 5 - 15    I have Reviewed nursing notes, Vitals, and Lab results since pt's last encounter. Pertinent lab results : see above I have ordered test including BMP, CBC, Mg I have reviewed the last note from staff over past 24 hours I have discussed pt's care plan and test results with nursing staff, case manager   LOS: 3 days   Dwyane Dee, MD [facility] 04/21/2022, 3:36 PM

## 2022-04-21 NOTE — Progress Notes (Signed)
Remote pacemaker transmission.   

## 2022-04-21 NOTE — TOC Initial Note (Addendum)
Transition of Care [facility]) - Initial/Assessment Note    Patient Details  Name: Steven Williams MRN: [id number] Date of Birth: [birth year]  [facility] of [facility] CM/SW Contact:    Curlene Labrum, RN Phone Number: 04/21/2022, 9:54 AM  Clinical Narrative:                 CM met with the patient at the bedside to discuss transitions of care.  The patient currently lives at [place] with available RW and cane at his apartment.  The patient is currently waiting on PT/OT Evaluation today.  CM will continue to follow the patient for TOC needs.  04/21/2022 1400 - PT note recommending SNF placement.  I spoke with the patient and daughter on the phone and both are agreeable to discuss SNF placement since the patient lives in an ILF at [facility] and does not have mobility to return to the apartment to live alone at this time.  I will work him up for SNF placement and offer bed offers to the patient and daughter once availability in the hub.  CM will continue to follow the patient for [facility] placement.  Expected Discharge Plan: [place] Barriers to Discharge: Continued Medical Work up   Patient Goals and CMS Choice Patient states their goals for this hospitalization and ongoing recovery are:: To return to [place] CMS Medicare.gov Compare Post Acute Care list provided to:: Patient Choice offered to / list presented to : Patient  Expected Discharge Plan and Services Expected Discharge Plan: [place]   Discharge Planning Services: CM Consult   Living arrangements for the past 2 months: [place] (Lives in a ILF apartment at [facility])                                      Prior Living Arrangements/Services 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 at [facility]) Lives with:: Facility Resident              Current home services: DME (RW and Kasandra Knudsen present  at home)    Activities of Daily Living Home Assistive Devices/Equipment: None ADL Screening (condition at time of admission) Patient's cognitive ability adequate to safely complete daily activities?: No Is the patient deaf or have difficulty hearing?: Yes Does the patient have difficulty seeing, even when wearing glasses/contacts?: No Does the patient have difficulty concentrating, remembering, or making decisions?: Yes Patient able to express need for assistance with ADLs?: Yes Does the patient have difficulty dressing or bathing?: Yes Independently performs ADLs?: No Does the patient have difficulty walking or climbing stairs?: Yes Weakness of Legs: Both Weakness of Arms/Hands: Both  Permission Sought/Granted                  Emotional Assessment              Admission diagnosis:  Dehydration [E86.0] Hypokalemia [E87.6] Thrombocytopenia (HCC) [D69.6] Chronic anemia [D64.9] Cellulitis of left leg [L03.116] AKI (acute kidney injury) ([place]) [N17.9] Left leg cellulitis [N30.051] Atrial fibrillation, unspecified type ([place]) [I48.91] Stage 4 chronic kidney disease ([place]) [N18.4] Patient Active Problem List   Diagnosis Date Noted   Cellulitis of left leg 04/18/2022   Acute renal failure superimposed on stage 4 chronic kidney disease ([place]) 04/18/2022   Chronic diastolic CHF (congestive heart failure) ([place]) 04/18/2022   Epistaxis 01/10/2022  DNR (do not resuscitate) 01/09/2022   Chronic venous hypertension (idiopathic) with ulcer and inflammation of bilateral lower extremity ([place]) 10/18/2021   Hypertensive heart and renal disease 10/18/2021   Lumbar radiculopathy 10/18/2021   Non-pressure chronic ulcer of unspecified part of right lower leg with unspecified severity ([place]) 10/18/2021   Unsteady gait 10/18/2021   Transient ischemic attack 10/18/2021   Pain of right hip joint 02/17/2021   Descending thoracic aortic aneurysm ([place]) 10/23/2020   Hypercoagulable state ([place])  10/23/2020   Preventative health care 10/23/2020   Anticoagulated 06/26/2020   Nasal septal deviation 06/26/2020   Recurrent epistaxis 06/26/2020   Headache 01/19/2020   Localized edema 12/29/2019   Long term (current) use of anticoagulants 12/25/2019   Dysphagia 07/06/2019   Restless legs syndrome 05/19/2019   Thrombocytopenia ([place]) 11/08/2018   Basal cell carcinoma of scalp 08/26/2018   Atherosclerotic heart disease of native coronary artery without angina pectoris 08/10/2018   CKD (chronic kidney disease) stage 4, GFR 15-29 ml/min (HCC) 07/28/2018   Chronic anemia 07/27/2018   Benign prostatic hyperplasia without lower urinary tract symptoms 07/27/2018   Carotid artery occlusion 07/27/2018   Essential hypertension 07/27/2018   Gout 07/27/2018   Hyperlipidemia 07/27/2018   Hypothyroidism 07/27/2018   Macular degeneration 07/27/2018   Paroxysmal atrial fibrillation ([place]) 07/27/2018   Polyneuropathy 07/27/2018   Rheumatoid arthritis ([place]) 07/27/2018   PCP:  Ginger Organ., MD Pharmacy:   [place], [place] [address] Phone: [phone] Fax: [phone]     Social Determinants of Health (SDOH) Interventions    Readmission Risk Interventions    04/21/2022    9:52 AM  Readmission Risk Prevention Plan  Transportation Screening Complete  Medication Review ([place]) Complete  PCP or Specialist appointment within 3-5 days of discharge Complete  HRI or [place] Complete  SW Recovery Care/Counseling Consult Complete  Palliative Care Screening Complete  [place] Not Applicable

## 2022-04-22 DIAGNOSIS — L03116 Cellulitis of left lower limb: Secondary | ICD-10-CM | POA: Diagnosis not present

## 2022-04-22 LAB — BASIC METABOLIC PANEL
Anion gap: 9 (ref 5–15)
BUN: 63 mg/dL — ABNORMAL HIGH (ref 8–23)
CO2: 19 mmol/L — ABNORMAL LOW (ref 22–32)
Calcium: 9.1 mg/dL (ref 8.9–10.3)
Chloride: 113 mmol/L — ABNORMAL HIGH (ref 98–111)
Creatinine, Ser: 2.37 mg/dL — ABNORMAL HIGH (ref 0.61–1.24)
GFR, Estimated: 25 mL/min — ABNORMAL LOW (ref >60)
Glucose, Bld: 98 mg/dL (ref 70–99)
Potassium: 4.3 mmol/L (ref 3.5–5.1)
Sodium: 141 mmol/L (ref 135–145)

## 2022-04-22 LAB — CBC WITH DIFFERENTIAL/PLATELET
Abs Immature Granulocytes: 0.16 10*3/uL — ABNORMAL HIGH (ref 0.00–0.07)
Basophils Absolute: 0 10*3/uL (ref 0.0–0.1)
Basophils Relative: 0 %
Eosinophils Absolute: 0.2 10*3/uL (ref 0.0–0.5)
Eosinophils Relative: 2 %
HCT: 27.8 % — ABNORMAL LOW (ref 39.0–52.0)
Hemoglobin: 9 g/dL — ABNORMAL LOW (ref 13.0–17.0)
Immature Granulocytes: 1 %
Lymphocytes Relative: 6 %
Lymphs Abs: 0.8 10*3/uL (ref 0.7–4.0)
MCH: 33.5 pg (ref 26.0–34.0)
MCHC: 32.4 g/dL (ref 30.0–36.0)
MCV: 103.3 fL — ABNORMAL HIGH (ref 80.0–100.0)
Monocytes Absolute: 0.5 10*3/uL (ref 0.1–1.0)
Monocytes Relative: 4 %
Neutro Abs: 11 10*3/uL — ABNORMAL HIGH (ref 1.7–7.7)
Neutrophils Relative %: 87 %
Platelets: 163 10*3/uL (ref 150–400)
RBC: 2.69 MIL/uL — ABNORMAL LOW (ref 4.22–5.81)
RDW: 14.4 % (ref 11.5–15.5)
WBC: 12.7 10*3/uL — ABNORMAL HIGH (ref 4.0–10.5)
nRBC: 0 % (ref 0.0–0.2)

## 2022-04-22 LAB — MAGNESIUM: Magnesium: 2 mg/dL (ref 1.7–2.4)

## 2022-04-22 MED ORDER — ACETAMINOPHEN 500 MG PO TABS
1000.0000 mg | ORAL_TABLET | Freq: Four times a day (QID) | ORAL | Status: DC
  Administered 2022-04-22 – 2022-04-27 (×19): 1000 mg via ORAL
  Filled 2022-04-22 (×20): qty 2

## 2022-04-22 MED ORDER — PREGABALIN 100 MG PO CAPS
100.0000 mg | ORAL_CAPSULE | Freq: Every day | ORAL | Status: DC
  Administered 2022-04-22 – 2022-04-26 (×5): 100 mg via ORAL
  Filled 2022-04-22 (×5): qty 1

## 2022-04-22 NOTE — Plan of Care (Signed)

## 2022-04-22 NOTE — Progress Notes (Signed)
Mobility Specialist Criteria Algorithm Info.   04/22/22 1150  Pain Assessment  Pain Assessment 0-10  Pain Score 6  Pain Location foot-LLE  Pain Descriptors / Indicators Radiating;Spasm;Shooting;Tender  Pain Intervention(s) Limited activity within patient's tolerance;RN gave pain meds during session  Mobility  Activity Transferred from bed to chair;Stood at bedside;Dangled on edge of bed  Range of Motion/Exercises Active;All extremities  Level of Assistance Maximum assist, patient does 25-49% (Mod cues for hand placement)  Assistive Device Other (Comment) (gait belt + arms held)  Activity Response Tolerated well   Patient received in supine eager to participate in mobility. 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 Requires mod A for bed mobility, more so assisting with LLE to EOB. Attempted sit>stand w/RW and max A but pt unable to come to complete stand. Completed front-facing squat pivot to recliner with max A. Tolerated without complaint or incident. Was left in recliner chair with all needs met, call bell in reach.   [place] Ariaunna [place], [place], [place]  JIRCV:[phone] Office: [phone]

## 2022-04-22 NOTE — Progress Notes (Signed)
Progress Note    Steven Williams   WER:[id number]  DOB: 09-11-1929  DOA: 04/18/2022     4 PCP: Ginger Organ., MD  Initial CC: left foot/leg pain  Hospital Course: Aster Eckrich is a pleasant 88 y.o. male with medical history significant for symptomatic bradycardia with pacer, CKD stage IV, chronic anemia and thrombocytopenia, thoracic aortic aneurysm followed by CT surgery, and paroxysmal atrial fibrillation off of anticoagulation for the past month due to severe epistaxis, presented to the ED with worsening left lower leg pain, swelling, and erythema over the past few days accompanied by fatigue and chills. In the ED his creatinine was 3.2, BUN of 101, Dopplers were negative for DVT. He was started on antibiotics for cellulitis.  Interval History:  No events overnight.  Pain continues to be relatively pronounced in his left foot and leg.  He does continue to attempt mobility as able but still not able to bear much weight on left lower extremity.  Assessment and Plan:  Left leg cellulitis  - Presents with ~1 wk of increased left lower leg pain, swelling, and redness  - Venous US negative for DVT  - still very TTP with ongoing weeping -CT left foot obtained on 04/21/2022 showing diffuse soft tissue swelling, worse dorsally.  No abscess noted no other acute osseous abnormality -Continue Rocephin - Checking ABIs today - Follow-up wound care evaluation after ABIs in efforts to try and compress some of the edema (light pressure wrapping may be all he can tolerate)  Physical deconditioning - Likely worsened in setting of cellulitis and inability to bear weight well - Continue working with PT as able - So far SNF recommended   AKI superimposed on CKD IV  - BUN 101 and SCr 3.27 in ED, up from 48 & 2.59 three weeks ago, baseline appears to be 2.5, renal ultrasound is negative -Creatinine improving with hydration, Lasix on hold; s/p IVF   PAF  - Has been off of warfarin for ~1 month  d/t severe recurrent epistaxis  - Continue digoxin, continue cardiology follow-up as planned to discuss resumption of anticoagulation     HTN  - Continue Coreg     Chronic HFpEF  - Appears compensated  -Lasix on hold   Anemia; thrombocytopenia  - Appears stable, no bleeding     Hypokalemia -Replace as needed   Old records reviewed in assessment of this patient  Antimicrobials: Rocephin 8/13 >> current   DVT prophylaxis:  Place and maintain sequential compression device Start: 04/19/22 1040 heparin injection 5,000 Units Start: 04/18/22 2200   Code Status:   Code Status: DNR  Mobility Assessment (last 72 hours)     Mobility Assessment     Row Name 04/21/22 1226           What is the highest level of mobility based on the progressive mobility assessment? Level 3 (Stands with assist) - Balance while standing  and cannot march in place                Barriers to discharge: none Disposition Plan:  SNF Status is: Inpt  Objective: Blood pressure 116/62, pulse 74, temperature 98 F (36.7 C), temperature source Oral, resp. rate 18, height '6\' 2"'$  (1.88 m), weight 84.7 kg, SpO2 100 %.  Examination:  Physical Exam Constitutional:      General: He is not in acute distress.    Appearance: Normal appearance.  HENT:     Head: Normocephalic and atraumatic.  Mouth/Throat:     Mouth: Mucous membranes are moist.  Eyes:     Extraocular Movements: Extraocular movements intact.  Cardiovascular:     Rate and Rhythm: Normal rate and regular rhythm.  Pulmonary:     Effort: Pulmonary effort is normal. No respiratory distress.     Breath sounds: Normal breath sounds. No wheezing.  Abdominal:     General: Bowel sounds are normal. There is no distension.     Palpations: Abdomen is soft.     Tenderness: There is no abdominal tenderness.  Musculoskeletal:        General: Swelling present.     Cervical back: Normal range of motion and neck supple.     Comments: 2+ LLE  edema with TTP noted to light palpation. Open wound on top of left foot with weeping of serous fluid noted   Skin:    Comments: Diffuse flaking of skin in B/L LE  Neurological:     General: No focal deficit present.     Mental Status: He is alert.  Psychiatric:        Mood and Affect: Mood normal.    Pic taken 8/16:    Consultants:    Procedures:    Data Reviewed: Results for orders placed or performed during the hospital encounter of 04/18/22 (from the past 24 hour(s))  Basic metabolic panel     Status: Abnormal   Collection Time: 04/22/22  6:21 AM  Result Value Ref Range   Sodium 141 135 - 145 mmol/L   Potassium 4.3 3.5 - 5.1 mmol/L   Chloride 113 (H) 98 - 111 mmol/L   CO2 19 (L) 22 - 32 mmol/L   Glucose, Bld 98 70 - 99 mg/dL   BUN 63 (H) 8 - 23 mg/dL   Creatinine, Ser 2.37 (H) 0.61 - 1.24 mg/dL   Calcium 9.1 8.9 - 10.3 mg/dL   GFR, Estimated 25 (L) >60 mL/min   Anion gap 9 5 - 15  CBC with Differential/Platelet     Status: Abnormal   Collection Time: 04/22/22  6:21 AM  Result Value Ref Range   WBC 12.7 (H) 4.0 - 10.5 K/uL   RBC 2.69 (L) 4.22 - 5.81 MIL/uL   Hemoglobin 9.0 (L) 13.0 - 17.0 g/dL   HCT 27.8 (L) 39.0 - 52.0 %   MCV 103.3 (H) 80.0 - 100.0 fL   MCH 33.5 26.0 - 34.0 pg   MCHC 32.4 30.0 - 36.0 g/dL   RDW 14.4 11.5 - 15.5 %   Platelets 163 150 - 400 K/uL   nRBC 0.0 0.0 - 0.2 %   Neutrophils Relative % 87 %   Neutro Abs 11.0 (H) 1.7 - 7.7 K/uL   Lymphocytes Relative 6 %   Lymphs Abs 0.8 0.7 - 4.0 K/uL   Monocytes Relative 4 %   Monocytes Absolute 0.5 0.1 - 1.0 K/uL   Eosinophils Relative 2 %   Eosinophils Absolute 0.2 0.0 - 0.5 K/uL   Basophils Relative 0 %   Basophils Absolute 0.0 0.0 - 0.1 K/uL   Immature Granulocytes 1 %   Abs Immature Granulocytes 0.16 (H) 0.00 - 0.07 K/uL  Magnesium     Status: None   Collection Time: 04/22/22  6:21 AM  Result Value Ref Range   Magnesium 2.0 1.7 - 2.4 mg/dL    I have Reviewed nursing notes, Vitals, and Lab  results since pt's last encounter. Pertinent lab results : see above I have ordered test including BMP, CBC,  Mg I have reviewed the last note from staff over past 24 hours I have discussed pt's care plan and test results with nursing staff, case manager   LOS: 4 days   Dwyane Dee, MD [facility] 04/22/2022, 3:41 PM

## 2022-04-22 NOTE — TOC Progression Note (Addendum)
Transition of Care [facility]) - Progression Note    Patient Details  Name: Steven Williams MRN: [id number] Date of Birth: 12/27/1929  Transition of Care [facility]) CM/SW [place], RN Phone Number: 04/22/2022, 10:19 AM  Clinical Narrative:    Cm met with the patient and daughter at the bedside to offer Medicare Choice regarding SNF placement - they will discuss this morning and choose facility.  PASRR completed after receiving the patient's SS information to complete.  04/22/2022 1046 - Met with the daughter and patient and they chose [facility].  I called and spoke with the attending physician and the patient will be medically ready for discharge in the next 1-2 days - pending pain control, [place] for ABI and wound consult pending.  Helene Kelp is aware that patient will need a bed in next 1-2 days.  Patient has traditional Medicare and will not need insurance authorization.  The patient's daughter will follow up at [place].  CM will continue to follow the patient for SNF placement.   Expected Discharge Plan: [place] Barriers to Discharge: Continued Medical Work up  Expected Discharge Plan and Services Expected Discharge Plan: [place]   Discharge Planning Services: CM Consult   Living arrangements for the past 2 months: [place] (Lives in a [place] apartment at [employer])                                       Social Determinants of Health ([place]) Interventions    Readmission Risk Interventions    04/21/2022    9:52 AM  Readmission Risk Prevention Plan  Transportation Screening Complete  Medication Review (RN Care Manager) Complete  PCP or Specialist appointment within 3-5 days of discharge Complete  HRI or [place] Complete  SW Recovery Care/Counseling Consult Complete  Palliative Care Screening Complete  [place] Not Applicable

## 2022-04-23 ENCOUNTER — Inpatient Hospital Stay (HOSPITAL_COMMUNITY): Payer: Medicare Other

## 2022-04-23 DIAGNOSIS — L03116 Cellulitis of left lower limb: Secondary | ICD-10-CM | POA: Diagnosis not present

## 2022-04-23 DIAGNOSIS — I70229 Atherosclerosis of native arteries of extremities with rest pain, unspecified extremity: Secondary | ICD-10-CM | POA: Diagnosis not present

## 2022-04-23 DIAGNOSIS — R6889 Other general symptoms and signs: Secondary | ICD-10-CM | POA: Diagnosis not present

## 2022-04-23 LAB — LIPID PANEL
Cholesterol: 80 mg/dL (ref 0–200)
HDL: 22 mg/dL — ABNORMAL LOW (ref >40)
LDL Cholesterol: 42 mg/dL (ref 0–99)
Total CHOL/HDL Ratio: 3.6 RATIO
Triglycerides: 78 mg/dL (ref <150)
VLDL: 16 mg/dL (ref 0–40)

## 2022-04-23 LAB — BASIC METABOLIC PANEL
Anion gap: 9 (ref 5–15)
BUN: 63 mg/dL — ABNORMAL HIGH (ref 8–23)
CO2: 18 mmol/L — ABNORMAL LOW (ref 22–32)
Calcium: 9 mg/dL (ref 8.9–10.3)
Chloride: 112 mmol/L — ABNORMAL HIGH (ref 98–111)
Creatinine, Ser: 2.41 mg/dL — ABNORMAL HIGH (ref 0.61–1.24)
GFR, Estimated: 25 mL/min — ABNORMAL LOW (ref >60)
Glucose, Bld: 102 mg/dL — ABNORMAL HIGH (ref 70–99)
Potassium: 4.2 mmol/L (ref 3.5–5.1)
Sodium: 139 mmol/L (ref 135–145)

## 2022-04-23 LAB — CBC WITH DIFFERENTIAL/PLATELET
Abs Immature Granulocytes: 0.12 10*3/uL — ABNORMAL HIGH (ref 0.00–0.07)
Basophils Absolute: 0 10*3/uL (ref 0.0–0.1)
Basophils Relative: 0 %
Eosinophils Absolute: 0.3 10*3/uL (ref 0.0–0.5)
Eosinophils Relative: 5 %
HCT: 25.4 % — ABNORMAL LOW (ref 39.0–52.0)
Hemoglobin: 8.3 g/dL — ABNORMAL LOW (ref 13.0–17.0)
Immature Granulocytes: 2 %
Lymphocytes Relative: 10 %
Lymphs Abs: 0.7 10*3/uL (ref 0.7–4.0)
MCH: 33.1 pg (ref 26.0–34.0)
MCHC: 32.7 g/dL (ref 30.0–36.0)
MCV: 101.2 fL — ABNORMAL HIGH (ref 80.0–100.0)
Monocytes Absolute: 0.6 10*3/uL (ref 0.1–1.0)
Monocytes Relative: 8 %
Neutro Abs: 5.4 10*3/uL (ref 1.7–7.7)
Neutrophils Relative %: 75 %
Platelets: 156 10*3/uL (ref 150–400)
RBC: 2.51 MIL/uL — ABNORMAL LOW (ref 4.22–5.81)
RDW: 14.5 % (ref 11.5–15.5)
WBC: 7.1 10*3/uL (ref 4.0–10.5)
nRBC: 0 % (ref 0.0–0.2)

## 2022-04-23 LAB — MAGNESIUM: Magnesium: 2 mg/dL (ref 1.7–2.4)

## 2022-04-23 LAB — CULTURE, BLOOD (ROUTINE X 2)
Culture: NO GROWTH
Culture: NO GROWTH
Special Requests: ADEQUATE

## 2022-04-23 LAB — UREA NITROGEN, URINE: Urea Nitrogen, Ur: 753 mg/dL

## 2022-04-23 MED ORDER — MEDIHONEY WOUND/BURN DRESSING EX PSTE
1.0000 | PASTE | Freq: Every day | CUTANEOUS | Status: DC
Start: 2022-04-23 — End: 2022-04-28
  Administered 2022-04-23 – 2022-04-27 (×5): 1 via TOPICAL
  Filled 2022-04-23 (×2): qty 44

## 2022-04-23 NOTE — Progress Notes (Signed)
  Mobility Specialist Criteria Algorithm Info.     04/23/22 1600  Mobility  Activity Moved into chair position in bed;Refused mobility   Patient just received dinner and requested to return later. Will f/u as time permits.  [place] Clary [place], [place], [place]  YDSWV:[phone] Office: [phone]

## 2022-04-23 NOTE — Consult Note (Addendum)
Hospital Consult    Reason for Consult:  abnormal ABIs  Requesting Physician:  Dwyane Dee, MD MRN #:  [id number]  History of Present Illness: Steven Williams is a 88 y.o. male with a past medical history significant for symptomatic bradycardia with pacer, CKD stage IV, chronic anemia and thrombocytopenia, thoracic aortic aneurysm followed by CT surgery, and paroxysmal atrial fibrillation off of anticoagulation for the past month due to severe epistaxis.  He was admitted to the emergency room on 04/18/2022 with 1 week of worsening left lower leg pain, swelling, and erythema.  The patient endorses weakness, fatigue, and chills for a few days before admission. He endorses worsening redness, pain, and swelling of the left lower leg before admission.  He denies new cough or shortness of breath, or chest pain.  In the ED the patient was found to be afebrile with a negative DVT study of the left leg.  It was determined that the patient had left leg cellulitis.  Blood cultures were collected in the ED and the patient was started on Zosyn.  The patient was then switched to Rocephin.  They have avoided vancomycin due to the patient's AKI on CKD 4.  We have been consulted due to the patient's continuing left leg pain since admission and abnormal ABIs.  The patient endorses some left leg pain, however he thinks it is a lot better.  He states the pain used to be in his thigh but now he feels it primarily at his ankle.   He denies any surgical history to his legs.  He denies any rest pain.  He denies any wounds that would not heal.  He endorses claudication in bilateral calves after walking for about 30 minutes. He stays very active and walks around his apartment a lot.  He states that he also attends exercise programs at the assisted living facility he is at.  He lives alone in an apartment.  He alternates between using a walker or a cane to move around.  The pt is not on a statin for cholesterol management.   The pt is not on a daily aspirin.   Other AC:  none The pt is on carvedilol for hypertension.   The pt is not diabetic.   Tobacco hx:  quit 59 years ago  Past Medical History:  Diagnosis Date   Antiphospholipid antibody positive    Aortic aneurysm (HCC)    thoracic, Dr Roxan Hockey, with h/o dissection   BPH (benign prostatic hyperplasia)    Central retinal artery occlusion    Chronic anemia    Chronic renal insufficiency    COPD (chronic obstructive pulmonary disease) (HCC)    DDD (degenerative disc disease), cervical    DJD (degenerative joint disease)    DVT (deep venous thrombosis) (HCC)    Gastric dysmotility    GERD (gastroesophageal reflux disease)    Gout    HTN (hypertension)    Hyperlipidemia    Hypertensive retinopathy    OU   Hypothyroid    Macular degeneration    Myelodysplasia (myelodysplastic syndrome) (HCC)    Paget's disease of bone    Peripheral neuropathy    Permanent atrial fibrillation (HCC)    Recurrent epistaxis 06/26/2020   Rheumatoid arthritis (HCC)    TIA (transient ischemic attack)     Past Surgical History:  Procedure Laterality Date   ATRIAL FIBRILLATION ABLATION  09/23/2014   Dr Encarnacion Chu in [place]     lumbar, x 2   CAROTID  ENDARTERECTOMY Right 1990   CAROTID STENT     CATARACT EXTRACTION Bilateral 2010   [place]   EYE SURGERY Bilateral 2010   [facility]   LUNG DECORTICATION     VATS   PACEMAKER GENERATOR CHANGE  12/03/2011   MDT Adapta ADDR01 generator change by Dr Encarnacion Chu for symptomatic bradycardia   PACEMAKER IMPLANT  2004   MDT     Allergies  Allergen Reactions   Iodinated Contrast Media Rash    Prior to Admission medications   Medication Sig Start Date End Date Taking? Authorizing Provider  acetaminophen (TYLENOL) 650 MG CR tablet Take 1,300 mg by mouth in the morning.   Yes [provider]  allopurinol (ZYLOPRIM) 100 MG tablet Take 100 mg by mouth daily.   Yes [provider]  carvedilol  (COREG) 6.25 MG tablet Take 6.25 mg by mouth 2 (two) times daily with a meal.   Yes [provider]  Cholecalciferol (VITAMIN D3) 50 MCG (2000 UT) TABS Take 2,000 Units by mouth daily.   Yes [provider]  digoxin (LANOXIN) 0.125 MG tablet Take 0.125 mg by mouth every Monday, Wednesday, and Friday.   Yes [provider]  Ferrous Sulfate (IRON) 325 (65 Fe) MG TABS Take 1 tablet by mouth 2 (two) times daily.   Yes [provider]  furosemide (LASIX) 40 MG tablet Take 40 mg by mouth in the morning.   Yes [provider]  hydrocortisone (ANUSOL-HC) 2.5 % rectal cream Apply 1 application topically 2 (two) times daily as needed for hemorrhoids or anal itching. 04/15/21  Yes [provider]  levothyroxine (SYNTHROID) 100 MCG tablet Take 100 mcg by mouth daily before breakfast. 06/04/20  Yes [provider]  Multiple Vitamins-Minerals (PRESERVISION AREDS PO) Take 1 capsule by mouth 2 (two) times daily.   Yes [provider]  pantoprazole (PROTONIX) 40 MG tablet Take 40 mg by mouth daily before breakfast. 06/04/20  Yes [provider]  Polyvinyl Alcohol-Povidone (REFRESH OP) Place 1 drop into both eyes 4 (four) times daily.   Yes [provider]  pregabalin (LYRICA) 75 MG capsule Take 75 mg by mouth 2 (two) times daily. Take 1 to 2 capsules by mouth twice daily.   Yes [provider]  rOPINIRole (REQUIP) 1 MG tablet Take 2 tablets (2 mg total) by mouth at bedtime. 02/19/22  Yes Penumalli, Earlean Polka, MD  saw palmetto 500 MG capsule Take 500 mg by mouth daily.   Yes [provider]  sodium chloride (OCEAN) 0.65 % SOLN nasal spray Place 2 sprays into both nostrils every 4 (four) hours while awake. 01/11/22 04/18/22 Yes Pahwani, Einar Grad, MD  tamsulosin (FLOMAX) 0.4 MG CAPS capsule Take 0.4 mg by mouth daily.   Yes [provider]  traMADol (ULTRAM) 50 MG tablet Take 50 mg by mouth every 8 (eight) hours as  needed for moderate pain. 04/17/22  Yes [provider]  vitamin B-12 (CYANOCOBALAMIN) 1000 MCG tablet Take 1 tablet (1,000 mcg total) by mouth daily. 07/09/21  Yes Orson Slick, MD  gabapentin (NEURONTIN) 400 MG capsule Take 400 mg by mouth at bedtime.    [provider]    Social History   Socioeconomic History   Marital status: Widowed    Spouse name: Not on file   Number of children: 3   Years of education: Not on file   Highest education level: Bachelor's degree (e.g., BA, AB, BS)  Occupational History   Occupation: retired  Comment: retired ME  Tobacco Use   Smoking status: Former    Packs/day: 2.00    Years: 12.00    Total pack years: 24.00    Types: Cigarettes    Quit date: 09/07/1962    Years since quitting: 59.6   Smokeless tobacco: Never  Vaping Use   Vaping Use: Never used  Substance and Sexual Activity   Alcohol use: Yes    Alcohol/week: 2.0 standard drinks of alcohol    Types: 2 Glasses of wine per week    Comment: daily   Drug use: Never   Sexual activity: Not Currently  Other Topics Concern   Not on file  Social History Narrative   Previously lived in [place]   03/12/20 Now lives in [place]   Retired Chief Financial Officer   Caffeine 2 a day   Social Determinants of Radio broadcast assistant Strain: Not on file  Food Insecurity: Not on file  Transportation Needs: Not on file  Physical Activity: Not on file  Stress: Not on file  Social Connections: Not on file  Intimate Partner Violence: Not on file     Family History  Problem Relation Age of Onset   Cirrhosis Mother    Stroke Father    Lung cancer Sister    Lung cancer Brother     ROS: '[x]'$  Positive   '[ ]'$  Negative   '[ ]'$  All sytems reviewed and are negative  Cardiac: '[]'$  chest pain/pressure '[]'$  palpitations '[]'$  SOB lying flat '[]'$  DOE  Vascular: '[x]'$  pain in legs while walking '[]'$  pain in legs at rest '[]'$  pain in legs at night '[]'$  non-healing ulcers '[]'$  hx of  DVT '[]'$  swelling in legs  Pulmonary: '[]'$  asthma/wheezing '[]'$  home O2  Neurologic: '[]'$  hx of CVA '[]'$  mini stroke   Hematologic: '[]'$  hx of cancer  Endocrine:   '[]'$  diabetes '[]'$  thyroid disease  GI '[]'$  GERD  GU: '[x]'$  CKD/renal failure '[]'$  HD--'[]'$  M/W/F or '[]'$  T/T/S  Psychiatric: '[]'$  anxiety '[]'$  depression  Musculoskeletal: '[]'$  arthritis '[]'$  joint pain  Integumentary: '[]'$  rashes '[]'$  ulcers  Constitutional: '[]'$  fever  '[]'$  chills  Physical Examination  Vitals:   04/23/22 0741 04/23/22 0808  BP: (!) 124/58 127/71  Pulse: (!) 57 66  Resp: 15 16  Temp: 98.2 F (36.8 C) 97.6 F (36.4 C)  SpO2: 99%    Body mass index is 23.89 kg/m.  General:  WDWN in NAD Gait: Not observed HENT: WNL, normocephalic Pulmonary: normal non-labored breathing Cardiac: regular, without murmur; without carotid bruit Abdomen:  soft, NT/ND; aortic pulse is non palpable Skin: without rashes. Hemosiderin staining of bilateral shins and dry skin Vascular Exam/Pulses: Palpable femoral pulses. Nonpalpable pedal pulses. Bilateral, monophasic DP/PT signals on doppler Extremities: without ischemic changes, without Gangrene , with left leg cellulitis and swelling; weeping open wound on dorsum of left foot    Musculoskeletal: no muscle wasting or atrophy  Neurologic: A&O X 3; speech is fluent/normal Psychiatric:  The pt has Normal affect.   CBC    Component Value Date/Time   WBC 7.1 04/23/2022 0451   RBC 2.51 (L) 04/23/2022 0451   HGB 8.3 (L) 04/23/2022 0451   HGB 10.1 (L) 03/25/2022 1240   HCT 25.4 (L) 04/23/2022 0451   HCT 29.9 (L) 03/25/2022 1240   PLT 156 04/23/2022 0451   PLT 109 (L) 03/25/2022 1240   MCV 101.2 (H) 04/23/2022 0451   MCV 99 (H) 03/25/2022 1240   MCH 33.1 04/23/2022 0451  MCHC 32.7 04/23/2022 0451   RDW 14.5 04/23/2022 0451   RDW 12.8 03/25/2022 1240   LYMPHSABS 0.7 04/23/2022 0451   LYMPHSABS 1.0 03/08/2020 1110   MONOABS 0.6 04/23/2022 0451   EOSABS 0.3 04/23/2022 0451    EOSABS 0.1 03/08/2020 1110   BASOSABS 0.0 04/23/2022 0451   BASOSABS 0.0 03/08/2020 1110    BMET    Component Value Date/Time   NA 139 04/23/2022 0451   NA 140 03/25/2022 1240   K 4.2 04/23/2022 0451   CL 112 (H) 04/23/2022 0451   CO2 18 (L) 04/23/2022 0451   GLUCOSE 102 (H) 04/23/2022 0451   BUN 63 (H) 04/23/2022 0451   BUN 48 (H) 03/25/2022 1240   CREATININE 2.41 (H) 04/23/2022 0451   CREATININE 2.71 (H) 11/10/2021 1342   CALCIUM 9.0 04/23/2022 0451   GFRNONAA 25 (L) 04/23/2022 0451   GFRNONAA 22 (L) 11/10/2021 1342   GFRAA 35 (L) 10/24/2020 1141    COAGS: Lab Results  Component Value Date   INR 1.3 (H) 04/18/2022   INR 1.3 (H) 01/11/2022   INR 2.2 (H) 01/10/2022     Non-Invasive Vascular Imaging:    ABI +---------+------------------+-----+-------------------+--------+  Right    Rt Pressure (mmHg)IndexWaveform           Comment   +---------+------------------+-----+-------------------+--------+  Brachial 99                     triphasic                    +---------+------------------+-----+-------------------+--------+  PTA      54                0.55 dampened monophasic          +---------+------------------+-----+-------------------+--------+  DP       255               2.58 monophasic                   +---------+------------------+-----+-------------------+--------+  Great Toe                       Absent                       +---------+------------------+-----+-------------------+--------+   +---------+------------------+-----+-------------------+---------------+  Left     Lt Pressure (mmHg)IndexWaveform           Comment          +---------+------------------+-----+-------------------+---------------+  Brachial                        triphasic          IV in upper arm  +---------+------------------+-----+-------------------+---------------+  PTA      255               2.58 dampened monophasic                  +---------+------------------+-----+-------------------+---------------+  DP       50                0.51 monophasic                          +---------+------------------+-----+-------------------+---------------+  Great Toe                       Absent                              +---------+------------------+-----+-------------------+---------------+   +-------+-----------+-----------+------------+------------+  ABI/TBIToday's ABIToday's TBIPrevious ABIPrevious TBI  +-------+-----------+-----------+------------+------------+  Right  Pump Back         Absent                               +-------+-----------+-----------+------------+------------+  Left            Absent                               +-------+-----------+-----------+------------+------------+    ASSESSMENT/PLAN: This is a 88 y.o. male with abnormal ABIs and left foot wound  -His ABIs are difficult to interpret due to noncompressibility. However he has bilateral, monophasic DP/PT signals -The patient has no history of nonhealing wounds or rest pain. He endorses claudication. He does have new weeping wounds on the dorsum of his left foot. -Infected left foot wounds potentially the cause of L leg cellulitis -The patient has palpable femoral pulses. He would benefit from angiogram and access through right groin to assess blood flow of L leg. He is a relatively active man and walks around when at home.   Vicente Serene, PA-C [facility] [phone]   I have seen and evaluated the patient. I agree with the PA note as documented above.  88 year old male that vascular surgery has been consulted for a left foot wound.  He states this has been present for several weeks.  He lives independently and is ambulatory.  He was admitted for left foot cellulitis.  He does have stage IV CKD and has stopped his anticoagulant for A-fib due to nosebleeds.  I have offered him aortogram, lower  extremity arteriogram with focus on the left leg.  His toe pressures are 0 on the left with monophasic waveforms and non-compressible ABI.  Please keep n.p.o. after midnight.  I discussed risk and benefits including risk of transfemoral access, renal insufficiency vessel injury etc.  We will schedule this for tomorrow with Dr. Stanford Breed.  Discussed doing this with as much CO2 as possible.  Marty Heck, MD [facility] [place] Office: [phone]

## 2022-04-23 NOTE — Progress Notes (Signed)
Progress Note    Steven Williams   MGN:[id number]  DOB: April 08, 1930  DOA: 04/18/2022     5 PCP: Ginger Organ., MD  Initial CC: left foot/leg pain  Hospital Course: Steven Williams is a pleasant 88 y.o. male with medical history significant for symptomatic bradycardia with pacer, CKD stage IV, chronic anemia and thrombocytopenia, thoracic aortic aneurysm followed by CT surgery, and paroxysmal atrial fibrillation off of anticoagulation for the past month due to severe epistaxis, presented to the ED with worsening left lower leg pain, swelling, and erythema over the past few days accompanied by fatigue and chills. In the ED his creatinine was 3.2, BUN of 101, Dopplers were negative for DVT. He was started on antibiotics for cellulitis.  Interval History:  No events overnight.  Still requiring pain medication for ongoing left lower extremity pain.  It does appear to have improved some since admission. ABIs were performed today and abnormal.  Vascular surgery was also consulted for assistance. Called and spoke with patient's daughter this afternoon for update as well.  Assessment and Plan:  Left leg cellulitis  - Presents with ~1 wk of increased left lower leg pain, swelling, and redness  - Venous US negative for DVT  - still very TTP with ongoing weeping intermittently but this seems improved some with some improvement in swelling since admission  -CT left foot obtained on 04/21/2022 showing diffuse soft tissue swelling, worse dorsally.  No abscess noted no other acute osseous abnormality -Continue Rocephin; complete 7 days and stop - see below (ABIs abnormal)  Left leg pain - he does endorse what sounds like claudication and he continues to mention some pain in his right lower extremity not just his left. -ABIs performed and show grossly abnormal values in both legs -Vascular surgery consulted for assistance with further evaluation - will check a lipid panel as well - he is not  currently on asa or statin at this time; will await guidance from VVS as plans for possible angiogram   Physical deconditioning - Likely worsened in setting of cellulitis and inability to bear weight well - Continue working with PT as able - So far SNF recommended   AKI superimposed on CKD IV  - BUN 101 and SCr 3.27 in ED, up from 48 & 2.59 three weeks ago, baseline appears to be 2.5, renal ultrasound is negative -Creatinine improving with hydration, Lasix on hold; s/p IVF   PAF  - Has been off of warfarin for ~1 month d/t severe recurrent epistaxis  - Continue digoxin, continue cardiology follow-up as planned to discuss resumption of anticoagulation     HTN  - Continue Coreg     Chronic HFpEF  - Appears compensated  -Lasix on hold   Anemia; thrombocytopenia  - Appears stable, no bleeding     Hypokalemia -Replace as needed   Old records reviewed in assessment of this patient  Antimicrobials: Rocephin 8/13 >> current   DVT prophylaxis:  Place and maintain sequential compression device Start: 04/19/22 1040 heparin injection 5,000 Units Start: 04/18/22 2200   Code Status:   Code Status: DNR  Mobility Assessment (last 72 hours)     Mobility Assessment     Row Name 04/22/22 2105 04/21/22 1226         Does patient have an order for bedrest or is patient medically unstable No - Continue assessment --      What is the highest level of mobility based on the progressive mobility assessment? Level 3 (  Stands with assist) - Balance while standing  and cannot march in place Level 3 (Stands with assist) - Balance while standing  and cannot march in place      Is the above level different from baseline mobility prior to current illness? Yes - Recommend PT order --               Barriers to discharge: none Disposition Plan:  SNF Status is: Inpt  Objective: Blood pressure 127/71, pulse 66, temperature 97.6 F (36.4 C), temperature source Oral, resp. rate 16, height 6'  2" (1.88 m), weight 84.4 kg, SpO2 99 %.  Examination:  Physical Exam Constitutional:      General: He is not in acute distress.    Appearance: Normal appearance.  HENT:     Head: Normocephalic and atraumatic.     Mouth/Throat:     Mouth: Mucous membranes are moist.  Eyes:     Extraocular Movements: Extraocular movements intact.  Cardiovascular:     Rate and Rhythm: Normal rate and regular rhythm.  Pulmonary:     Effort: Pulmonary effort is normal. No respiratory distress.     Breath sounds: Normal breath sounds. No wheezing.  Abdominal:     General: Bowel sounds are normal. There is no distension.     Palpations: Abdomen is soft.     Tenderness: There is no abdominal tenderness.  Musculoskeletal:        General: Swelling present.     Cervical back: Normal range of motion and neck supple.     Comments: 1-2+ LLE edema with TTP noted to palpation. Open wound on top of left foot with weeping of serous fluid noted   Skin:    Comments: Diffuse flaking of skin in B/L LE  Neurological:     General: No focal deficit present.     Mental Status: He is alert.  Psychiatric:        Mood and Affect: Mood normal.    Pic taken 8/16:    Consultants:    Procedures:    Data Reviewed: Results for orders placed or performed during the hospital encounter of 04/18/22 (from the past 24 hour(s))  Basic metabolic panel     Status: Abnormal   Collection Time: 04/23/22  4:51 AM  Result Value Ref Range   Sodium 139 135 - 145 mmol/L   Potassium 4.2 3.5 - 5.1 mmol/L   Chloride 112 (H) 98 - 111 mmol/L   CO2 18 (L) 22 - 32 mmol/L   Glucose, Bld 102 (H) 70 - 99 mg/dL   BUN 63 (H) 8 - 23 mg/dL   Creatinine, Ser 2.41 (H) 0.61 - 1.24 mg/dL   Calcium 9.0 8.9 - 10.3 mg/dL   GFR, Estimated 25 (L) >60 mL/min   Anion gap 9 5 - 15  CBC with Differential/Platelet     Status: Abnormal   Collection Time: 04/23/22  4:51 AM  Result Value Ref Range   WBC 7.1 4.0 - 10.5 K/uL   RBC 2.51 (L) 4.22 - 5.81  MIL/uL   Hemoglobin 8.3 (L) 13.0 - 17.0 g/dL   HCT 25.4 (L) 39.0 - 52.0 %   MCV 101.2 (H) 80.0 - 100.0 fL   MCH 33.1 26.0 - 34.0 pg   MCHC 32.7 30.0 - 36.0 g/dL   RDW 14.5 11.5 - 15.5 %   Platelets 156 150 - 400 K/uL   nRBC 0.0 0.0 - 0.2 %   Neutrophils Relative % 75 %   Neutro  Abs 5.4 1.7 - 7.7 K/uL   Lymphocytes Relative 10 %   Lymphs Abs 0.7 0.7 - 4.0 K/uL   Monocytes Relative 8 %   Monocytes Absolute 0.6 0.1 - 1.0 K/uL   Eosinophils Relative 5 %   Eosinophils Absolute 0.3 0.0 - 0.5 K/uL   Basophils Relative 0 %   Basophils Absolute 0.0 0.0 - 0.1 K/uL   Immature Granulocytes 2 %   Abs Immature Granulocytes 0.12 (H) 0.00 - 0.07 K/uL  Magnesium     Status: None   Collection Time: 04/23/22  4:51 AM  Result Value Ref Range   Magnesium 2.0 1.7 - 2.4 mg/dL    I have Reviewed nursing notes, Vitals, and Lab results since pt's last encounter. Pertinent lab results : see above I have ordered test including BMP, CBC, Mg I have reviewed the last note from staff over past 24 hours I have discussed pt's care plan and test results with nursing staff, case manager   LOS: 5 days   Dwyane Dee, MD [facility] 04/23/2022, 3:14 PM

## 2022-04-23 NOTE — Progress Notes (Signed)
ABI/TBI study completed. Please see CV Proc for preliminary results.  Azara Gemme BS, RVT 04/23/2022 11:41 AM

## 2022-04-23 NOTE — Consult Note (Signed)
WOC follow up on ABIs, they are abnormal with non compressible vessels, discussed wound on the LLE and LLE status with hospitalist.  Resting leg pain as well.   Will order topical care for the wound, Dr. Sabino Gasser to discuss patient with [facility]VS. WOC will sign off.   Topical care can continue as ordered today, follow up outpatient with primary care or wound care center of patient's choice.   [place], [place], [place]

## 2022-04-24 ENCOUNTER — Encounter (HOSPITAL_COMMUNITY)
Admission: EM | Disposition: A | Discharge status: Skilled Nursing Facility | Payer: Self-pay | Source: Skilled Nursing Facility | Attending: Internal Medicine

## 2022-04-24 DIAGNOSIS — I739 Peripheral vascular disease, unspecified: Secondary | ICD-10-CM

## 2022-04-24 DIAGNOSIS — L03116 Cellulitis of left lower limb: Secondary | ICD-10-CM | POA: Diagnosis not present

## 2022-04-24 DIAGNOSIS — N184 Chronic kidney disease, stage 4 (severe): Secondary | ICD-10-CM | POA: Diagnosis not present

## 2022-04-24 DIAGNOSIS — N179 Acute kidney failure, unspecified: Secondary | ICD-10-CM | POA: Diagnosis not present

## 2022-04-24 HISTORY — PX: ABDOMINAL AORTOGRAM W/LOWER EXTREMITY: CATH118223

## 2022-04-24 LAB — BASIC METABOLIC PANEL
Anion gap: 7 (ref 5–15)
BUN: 61 mg/dL — ABNORMAL HIGH (ref 8–23)
CO2: 20 mmol/L — ABNORMAL LOW (ref 22–32)
Calcium: 9.6 mg/dL (ref 8.9–10.3)
Chloride: 115 mmol/L — ABNORMAL HIGH (ref 98–111)
Creatinine, Ser: 2.3 mg/dL — ABNORMAL HIGH (ref 0.61–1.24)
GFR, Estimated: 26 mL/min — ABNORMAL LOW (ref >60)
Glucose, Bld: 107 mg/dL — ABNORMAL HIGH (ref 70–99)
Potassium: 4.9 mmol/L (ref 3.5–5.1)
Sodium: 142 mmol/L (ref 135–145)

## 2022-04-24 LAB — CBC WITH DIFFERENTIAL/PLATELET
Abs Immature Granulocytes: 0.16 10*3/uL — ABNORMAL HIGH (ref 0.00–0.07)
Basophils Absolute: 0 10*3/uL (ref 0.0–0.1)
Basophils Relative: 0 %
Eosinophils Absolute: 0.3 10*3/uL (ref 0.0–0.5)
Eosinophils Relative: 3 %
HCT: 27.6 % — ABNORMAL LOW (ref 39.0–52.0)
Hemoglobin: 8.9 g/dL — ABNORMAL LOW (ref 13.0–17.0)
Immature Granulocytes: 2 %
Lymphocytes Relative: 7 %
Lymphs Abs: 0.7 10*3/uL (ref 0.7–4.0)
MCH: 33.6 pg (ref 26.0–34.0)
MCHC: 32.2 g/dL (ref 30.0–36.0)
MCV: 104.2 fL — ABNORMAL HIGH (ref 80.0–100.0)
Monocytes Absolute: 0.5 10*3/uL (ref 0.1–1.0)
Monocytes Relative: 6 %
Neutro Abs: 7.7 10*3/uL (ref 1.7–7.7)
Neutrophils Relative %: 82 %
Platelets: 191 10*3/uL (ref 150–400)
RBC: 2.65 MIL/uL — ABNORMAL LOW (ref 4.22–5.81)
RDW: 14.6 % (ref 11.5–15.5)
WBC: 9.5 10*3/uL (ref 4.0–10.5)
nRBC: 0 % (ref 0.0–0.2)

## 2022-04-24 LAB — MAGNESIUM: Magnesium: 2.1 mg/dL (ref 1.7–2.4)

## 2022-04-24 SURGERY — ABDOMINAL AORTOGRAM W/LOWER EXTREMITY
Anesthesia: LOCAL | Laterality: Left

## 2022-04-24 MED ORDER — LABETALOL HCL 5 MG/ML IV SOLN: 10.0000 mg | INTRAVENOUS | Status: DC | PRN

## 2022-04-24 MED ORDER — HEPARIN (PORCINE) IN NACL 1000-0.9 UT/500ML-% IV SOLN
INTRAVENOUS | Status: DC | PRN
  Administered 2022-04-24 (×2): 500 mL

## 2022-04-24 MED ORDER — SODIUM CHLORIDE 0.9% FLUSH
3.0000 mL | INTRAVENOUS | Status: DC | PRN
Start: 2022-04-24 — End: 2022-04-28

## 2022-04-24 MED ORDER — METHYLPREDNISOLONE SODIUM SUCC 125 MG IJ SOLR
INTRAMUSCULAR | Status: AC
  Filled 2022-04-24: qty 2

## 2022-04-24 MED ORDER — HEPARIN SODIUM (PORCINE) 5000 UNIT/ML IJ SOLN
5000.0000 [IU] | Freq: Three times a day (TID) | INTRAMUSCULAR | Status: DC
  Administered 2022-04-24 – 2022-04-27 (×9): 5000 [IU] via SUBCUTANEOUS
  Filled 2022-04-24 (×9): qty 1

## 2022-04-24 MED ORDER — DIPHENHYDRAMINE HCL 50 MG/ML IJ SOLN
INTRAMUSCULAR | Status: AC
  Filled 2022-04-24: qty 1

## 2022-04-24 MED ORDER — ASPIRIN 81 MG PO TBEC
81.0000 mg | DELAYED_RELEASE_TABLET | Freq: Every day | ORAL | Status: DC
  Administered 2022-04-25 – 2022-04-27 (×3): 81 mg via ORAL
  Filled 2022-04-24 (×3): qty 1

## 2022-04-24 MED ORDER — LIDOCAINE HCL (PF) 1 % IJ SOLN
INTRAMUSCULAR | Status: AC
  Filled 2022-04-24: qty 30

## 2022-04-24 MED ORDER — ATORVASTATIN CALCIUM 40 MG PO TABS
40.0000 mg | ORAL_TABLET | Freq: Every day | ORAL | Status: DC
  Administered 2022-04-24 – 2022-04-27 (×4): 40 mg via ORAL
  Filled 2022-04-24 (×4): qty 1

## 2022-04-24 MED ORDER — LIDOCAINE HCL (PF) 1 % IJ SOLN
INTRAMUSCULAR | Status: DC | PRN
  Administered 2022-04-24: 18 mL

## 2022-04-24 MED ORDER — IODIXANOL 320 MG/ML IV SOLN
INTRAVENOUS | Status: DC | PRN
  Administered 2022-04-24: 5 mL via INTRAVENOUS

## 2022-04-24 MED ORDER — DIPHENHYDRAMINE HCL 50 MG/ML IJ SOLN
INTRAMUSCULAR | Status: DC | PRN
  Administered 2022-04-24: 25 mg via INTRAVENOUS

## 2022-04-24 MED ORDER — HEPARIN (PORCINE) IN NACL 1000-0.9 UT/500ML-% IV SOLN
INTRAVENOUS | Status: AC
  Filled 2022-04-24: qty 1000

## 2022-04-24 MED ORDER — SODIUM CHLORIDE 0.9 % IV SOLN: 250.0000 mL | INTRAVENOUS | Status: DC | PRN

## 2022-04-24 MED ORDER — ACETAMINOPHEN 325 MG PO TABS
650.0000 mg | ORAL_TABLET | ORAL | Status: DC | PRN
Start: 2022-04-24 — End: 2022-04-28

## 2022-04-24 MED ORDER — SODIUM CHLORIDE 0.9% FLUSH
3.0000 mL | Freq: Two times a day (BID) | INTRAVENOUS | Status: DC
  Administered 2022-04-25 – 2022-04-27 (×3): 3 mL via INTRAVENOUS

## 2022-04-24 MED ORDER — METHYLPREDNISOLONE SODIUM SUCC 125 MG IJ SOLR
INTRAMUSCULAR | Status: DC | PRN
  Administered 2022-04-24: 125 mg via INTRAVENOUS

## 2022-04-24 MED ORDER — HYDRALAZINE HCL 20 MG/ML IJ SOLN: 5.0000 mg | INTRAMUSCULAR | Status: DC | PRN

## 2022-04-24 MED ORDER — ONDANSETRON HCL 4 MG/2ML IJ SOLN: 4.0000 mg | Freq: Four times a day (QID) | INTRAMUSCULAR | Status: DC | PRN

## 2022-04-24 SURGICAL SUPPLY — 15 items
CATH NAVICROSS ST .035X135CM (MICROCATHETER) IMPLANT
CATH NAVICROSS ST 65CM (CATHETERS) IMPLANT
CATH OMNI FLUSH 5F 65CM (CATHETERS) IMPLANT
CATH TEMPO AQUA 5F 100CM (CATHETERS) IMPLANT
CATHETER NAVICROSS ST 65CM (CATHETERS) ×1
GLIDEWIRE ADV .035X180CM (WIRE) IMPLANT
KIT ANGIASSIST CO2 SYSTEM (KITS) IMPLANT
KIT MICROPUNCTURE NIT STIFF (SHEATH) IMPLANT
KIT PV (KITS) ×1 IMPLANT
SHEATH PINNACLE 5F 10CM (SHEATH) IMPLANT
SHEATH PROBE COVER 6X72 (BAG) IMPLANT
TRANSDUCER W/STOPCOCK (MISCELLANEOUS) ×1 IMPLANT
TRAY PV CATH (CUSTOM PROCEDURE TRAY) ×1 IMPLANT
WIRE BENTSON .035X145CM (WIRE) IMPLANT
WIRE HI TORQ VERSACORE 300 (WIRE) IMPLANT

## 2022-04-24 NOTE — Progress Notes (Signed)
[facility] [place] PROGRESS NOTE  ASSESSMENT / PLAN: Steven Williams is a 88 y.o. male with left foot ulceration. Non-invasive testing shows evidence of severe peripheral arterial disease.    SUBJECTIVE: No complaints. Ready for procedure.  OBJECTIVE: BP (!) 150/59   Pulse 100   Temp 97.9 F (36.6 C) (Oral)   Resp 17   Ht '6\' 2"'$  (1.88 m)   Wt 87 kg   SpO2 (!) 89%   BMI 24.63 kg/m   Intake/Output Summary (Last 24 hours) at 04/24/2022 1323 Last data filed at 04/24/2022 0659 Gross per 24 hour  Intake --  Output 1300 ml  Net -1300 ml    No distress Regular rate and rhythm Unlabored breathing Unchanged appearance of left foot     Latest Ref Rng & Units 04/24/2022    6:46 AM 04/23/2022    4:51 AM 04/22/2022    6:21 AM  CBC  WBC 4.0 - 10.5 K/uL 9.5  7.1  12.7   Hemoglobin 13.0 - 17.0 g/dL 8.9  8.3  9.0   Hematocrit 39.0 - 52.0 % 27.6  25.4  27.8   Platelets 150 - 400 K/uL 191  156  163         Latest Ref Rng & Units 04/24/2022    6:46 AM 04/23/2022    4:51 AM 04/22/2022    6:21 AM  CMP  Glucose 70 - 99 mg/dL 107  102  98   BUN 8 - 23 mg/dL 61  63  63   Creatinine 0.61 - 1.24 mg/dL 2.30  2.41  2.37   Sodium 135 - 145 mmol/L 142  139  141   Potassium 3.5 - 5.1 mmol/L 4.9  4.2  4.3   Chloride 98 - 111 mmol/L 115  112  113   CO2 22 - 32 mmol/L '20  18  19   '$ Calcium 8.9 - 10.3 mg/dL 9.6  9.0  9.1     Estimated Creatinine Clearance: 24.8 mL/min (A) (by C-G formula based on SCr of 2.3 mg/dL (H)).  Steven Williams. Stanford Breed, MD [facility] Phone Number: [phone] 04/24/2022 1:23 PM

## 2022-04-24 NOTE — Op Note (Signed)
DATE OF SERVICE: 04/24/2022  PATIENT:  Steven Williams  88 y.o. male  PRE-OPERATIVE DIAGNOSIS:  Atherosclerosis of native arteries of left lower extremity causing ulceration  POST-OPERATIVE DIAGNOSIS:  Same  PROCEDURE:   1) Ultrasound guided right common femoral artery access 2) Aortogram 3) Left lower extremity angiogram with third order cannulation   SURGEON:  Yevonne Aline. Stanford Breed, MD  ASSISTANT: none  ANESTHESIA:   local  ESTIMATED BLOOD LOSS: minimal  LOCAL MEDICATIONS USED:  LIDOCAINE   COUNTS: confirmed correct.  PATIENT DISPOSITION:  PACU - hemodynamically stable.   Delay start of Pharmacological VTE agent (>24hrs) due to surgical blood loss or risk of bleeding: no  INDICATION FOR PROCEDURE: Steven Williams is a 88 y.o. male with left foot ulceration with non-invasive evidence of peripheral arterial disease. After careful discussion of risks, benefits, and alternatives the patient was offered angiography. The patient understood and wished to proceed.  OPERATIVE FINDINGS:  Terminal aorta and iliac arteries: Poorly visualized, but widely patent. Small left common iliac aneurysm.  Left lower extremity: Common femoral artery: widely patent  Profunda femoris artery: widely patent  Superficial femoral artery: widely patent Popliteal artery: widely patent Anterior tibial artery: occluded Tibioperoneal trunk: widely patent Peroneal artery: widely patent to ankle. Arborization about the ankle.  Posterior tibial artery: occluded Pedal circulation: small vessel disease about the foot and ankle.  GLASS score. N/A. Inline flow to the ankle.  WIfI score. 1 / 3 / 1. Clinical stage 3.   DESCRIPTION OF PROCEDURE: After identification of the patient in the pre-operative holding area, the patient was transferred to the operating room. The patient was positioned supine on the operating room table. The groins was prepped and draped in standard fashion. A surgical pause was performed  confirming correct patient, procedure, and operative location.  The right groin was anesthetized with subcutaneous injection of 1% lidocaine. Using ultrasound guidance, the right common femoral artery was accessed with micropuncture technique. Fluoroscopy was used to confirm cannulation over the femoral head. The 34F sheath was upsized to 56F.   A Benson wire was advanced into the distal aorta. Over the wire an omni flush catheter was advanced to the level of L2. Aortogram was performed - see above for details.   The left common iliac artery was selected with an omniflush catheter and glidewire guidewire. The wire was advanced into the common femoral artery. Over the wire the omni flush catheter was advanced into the external iliac artery. Selective angiography was performed - see above for details.   The sheath was left in place to be removed in the recovery area.   Upon completion of the case instrument and sharps counts were confirmed correct. The patient was transferred to the PACU in good condition. I was present for all portions of the procedure.  PLAN: Small vessel disease in the foot. Patient optimized from a vascular standpoint. Recommend best medical therapy for peripheral arterial disease (ASA, statin, etc.).   Yevonne Aline. Stanford Breed, MD [facility] Phone Number: [phone] 04/24/2022 1:18 PM

## 2022-04-24 NOTE — Progress Notes (Signed)
PT Cancellation Note  Patient Details Name: Steven Williams MRN: [id number] DOB: 1930-02-12   Cancelled Treatment:    Reason Eval/Treat Not Completed: (P) Patient at procedure or test/unavailable (pt at cath lab for procedure.) Will continue efforts per PT plan of care as schedule permits.   [name] M [name] 04/24/2022, 1:25 PM

## 2022-04-24 NOTE — Progress Notes (Addendum)
Progress Note    Conley Delisle   XBD:[id number]  DOB: 03-Apr-1930  DOA: 04/18/2022     6 PCP: Ginger Organ., MD  Initial CC: left foot/leg pain  Hospital Course: Steven Williams is a pleasant 88 y.o. male with medical history significant for symptomatic bradycardia with pacer, CKD stage IV, chronic anemia and thrombocytopenia, thoracic aortic aneurysm followed by CT surgery, and paroxysmal atrial fibrillation off of anticoagulation for the past month due to severe epistaxis, presented to the ED with worsening left lower leg pain, swelling, and erythema over the past few days accompanied by fatigue and chills. In the ED his creatinine was 3.2, BUN of 101, Dopplers were negative for DVT. He was started on antibiotics for cellulitis.  Interval History:  Seen in room after returning from angiogram.  He was resting comfortably and sleeping.  Assessment and Plan:  Left leg cellulitis  - Presents with ~1 wk of increased left lower leg pain, swelling, and redness  - Venous US negative for DVT  - still very TTP with ongoing weeping intermittently but this seems improved some with some improvement in swelling since admission  -CT left foot obtained on 04/21/2022 showing diffuse soft tissue swelling, worse dorsally.  No abscess noted no other acute osseous abnormality -Continue Rocephin; complete 7 days and stop - see below (ABIs abnormal)  PAD - he does endorse what sounds like claudication and he continues to mention some pain in his right lower extremity not just his left -ABIs performed and show grossly abnormal values in both legs -Vascular surgery consulted for assistance with further evaluation - underwent LLE angiogram which showed occlusion of anterior and posterior tibial arteries -Patient started on aspirin and statin - monitor for any epistaxis on asa (had bleeding on coumadin) - LDL 42 from lipid panel 8/17  Physical deconditioning - Likely worsened in setting of cellulitis  and inability to bear weight well - Continue working with PT as able - So far SNF recommended   AKI superimposed on CKD IV  - BUN 101 and SCr 3.27 in ED, up from 48 & 2.59 three weeks ago, baseline appears to be 2.5, renal ultrasound is negative -Creatinine improving with hydration, Lasix on hold; s/p IVF   PAF  - Has been off of warfarin for ~1 month d/t severe recurrent epistaxis  - Continue digoxin, continue cardiology follow-up as planned to discuss resumption of anticoagulation     HTN  - Continue Coreg     Chronic HFpEF  - Appears compensated  -Lasix on hold   Anemia; thrombocytopenia  - Appears stable, no bleeding     Hypokalemia -Replace as needed   Old records reviewed in assessment of this patient  Antimicrobials: Rocephin 8/13 >> current   DVT prophylaxis:  heparin injection 5,000 Units Start: 04/24/22 2200 Place and maintain sequential compression device Start: 04/19/22 1040   Code Status:   Code Status: DNR  Mobility Assessment (last 72 hours)     Mobility Assessment     Row Name 04/24/22 1501 04/23/22 2219 04/22/22 2105       Does patient have an order for bedrest or is patient medically unstable No - Continue assessment No - Continue assessment No - Continue assessment     What is the highest level of mobility based on the progressive mobility assessment? Level 1 (Bedfast) - Unable to balance while sitting on edge of bed  Pt on bedrest until 1800 Level 3 (Stands with assist) - Balance while standing  and cannot march in place Level 3 (Stands with assist) - Balance while standing  and cannot march in place     Is the above level different from baseline mobility prior to current illness? Yes - Recommend PT order Yes - Recommend PT order Yes - Recommend PT order              Barriers to discharge: none Disposition Plan:  SNF Status is: Inpt  Objective: Blood pressure 122/68, pulse 95, temperature 97.8 F (36.6 C), temperature source Oral, resp.  rate 16, height '6\' 2"'$  (1.88 m), weight 87 kg, SpO2 97 %.  Examination:  Physical Exam Constitutional:      General: He is not in acute distress.    Appearance: Normal appearance.  HENT:     Head: Normocephalic and atraumatic.     Mouth/Throat:     Mouth: Mucous membranes are moist.  Eyes:     Extraocular Movements: Extraocular movements intact.  Cardiovascular:     Rate and Rhythm: Normal rate and regular rhythm.  Pulmonary:     Effort: Pulmonary effort is normal. No respiratory distress.     Breath sounds: Normal breath sounds. No wheezing.  Abdominal:     General: Bowel sounds are normal. There is no distension.     Palpations: Abdomen is soft.     Tenderness: There is no abdominal tenderness.  Musculoskeletal:        General: Swelling present.     Cervical back: Normal range of motion and neck supple.     Comments: 1-2+ LLE edema with TTP noted to palpation. Open wound on top of left foot with weeping of serous fluid noted   Skin:    Comments: Diffuse flaking of skin in B/L LE  Neurological:     General: No focal deficit present.     Mental Status: He is alert.  Psychiatric:        Mood and Affect: Mood normal.    Pic taken 8/16:    Consultants:    Procedures:    Data Reviewed: Results for orders placed or performed during the hospital encounter of 04/18/22 (from the past 24 hour(s))  Basic metabolic panel     Status: Abnormal   Collection Time: 04/24/22  6:46 AM  Result Value Ref Range   Sodium 142 135 - 145 mmol/L   Potassium 4.9 3.5 - 5.1 mmol/L   Chloride 115 (H) 98 - 111 mmol/L   CO2 20 (L) 22 - 32 mmol/L   Glucose, Bld 107 (H) 70 - 99 mg/dL   BUN 61 (H) 8 - 23 mg/dL   Creatinine, Ser 2.30 (H) 0.61 - 1.24 mg/dL   Calcium 9.6 8.9 - 10.3 mg/dL   GFR, Estimated 26 (L) >60 mL/min   Anion gap 7 5 - 15  CBC with Differential/Platelet     Status: Abnormal   Collection Time: 04/24/22  6:46 AM  Result Value Ref Range   WBC 9.5 4.0 - 10.5 K/uL   RBC 2.65  (L) 4.22 - 5.81 MIL/uL   Hemoglobin 8.9 (L) 13.0 - 17.0 g/dL   HCT 27.6 (L) 39.0 - 52.0 %   MCV 104.2 (H) 80.0 - 100.0 fL   MCH 33.6 26.0 - 34.0 pg   MCHC 32.2 30.0 - 36.0 g/dL   RDW 14.6 11.5 - 15.5 %   Platelets 191 150 - 400 K/uL   nRBC 0.0 0.0 - 0.2 %   Neutrophils Relative % 82 %   Neutro Abs  7.7 1.7 - 7.7 K/uL   Lymphocytes Relative 7 %   Lymphs Abs 0.7 0.7 - 4.0 K/uL   Monocytes Relative 6 %   Monocytes Absolute 0.5 0.1 - 1.0 K/uL   Eosinophils Relative 3 %   Eosinophils Absolute 0.3 0.0 - 0.5 K/uL   Basophils Relative 0 %   Basophils Absolute 0.0 0.0 - 0.1 K/uL   Immature Granulocytes 2 %   Abs Immature Granulocytes 0.16 (H) 0.00 - 0.07 K/uL  Magnesium     Status: None   Collection Time: 04/24/22  6:46 AM  Result Value Ref Range   Magnesium 2.1 1.7 - 2.4 mg/dL    I have Reviewed nursing notes, Vitals, and Lab results since pt's last encounter. Pertinent lab results : see above I have ordered test including BMP, CBC, Mg I have reviewed the last note from staff over past 24 hours I have discussed pt's care plan and test results with nursing staff, case manager   LOS: 6 days   Dwyane Dee, MD [facility] 04/24/2022, 4:29 PM

## 2022-04-24 NOTE — Progress Notes (Signed)
Sheath removed 13:40 with manual pressure held for approximately 20 minutes. Hemostasis obtained at 14:00. Level 0, skin soft non-tender. Site dressed with gauze and covered with transparent dressing. Distal pedal pulse confirmed with doppler.

## 2022-04-24 NOTE — Plan of Care (Signed)

## 2022-04-24 NOTE — Progress Notes (Signed)
Patient arrived to 4E from the cath lab. Vitals taken and stable. Patient placed on tele and CCMD notified. CHG completed. Right groin site assessed and level 0. Patient oriented to unit and staff. Call bell within reach. Daughter called and notified that patient in room.  [place] C Joby Richart

## 2022-04-25 DIAGNOSIS — N179 Acute kidney failure, unspecified: Secondary | ICD-10-CM | POA: Diagnosis not present

## 2022-04-25 DIAGNOSIS — I739 Peripheral vascular disease, unspecified: Secondary | ICD-10-CM | POA: Diagnosis not present

## 2022-04-25 DIAGNOSIS — N184 Chronic kidney disease, stage 4 (severe): Secondary | ICD-10-CM | POA: Diagnosis not present

## 2022-04-25 DIAGNOSIS — L03116 Cellulitis of left lower limb: Secondary | ICD-10-CM | POA: Diagnosis not present

## 2022-04-25 LAB — CBC WITH DIFFERENTIAL/PLATELET
Abs Immature Granulocytes: 0.1 10*3/uL — ABNORMAL HIGH (ref 0.00–0.07)
Basophils Absolute: 0 10*3/uL (ref 0.0–0.1)
Basophils Relative: 0 %
Eosinophils Absolute: 0 10*3/uL (ref 0.0–0.5)
Eosinophils Relative: 0 %
HCT: 27.9 % — ABNORMAL LOW (ref 39.0–52.0)
Hemoglobin: 9.2 g/dL — ABNORMAL LOW (ref 13.0–17.0)
Lymphocytes Relative: 2 %
Lymphs Abs: 0.2 10*3/uL — ABNORMAL LOW (ref 0.7–4.0)
MCH: 33.8 pg (ref 26.0–34.0)
MCHC: 33 g/dL (ref 30.0–36.0)
MCV: 102.6 fL — ABNORMAL HIGH (ref 80.0–100.0)
Metamyelocytes Relative: 1 %
Monocytes Absolute: 0 10*3/uL — ABNORMAL LOW (ref 0.1–1.0)
Monocytes Relative: 0 %
Neutro Abs: 7.9 10*3/uL — ABNORMAL HIGH (ref 1.7–7.7)
Neutrophils Relative %: 97 %
Platelets: 199 10*3/uL (ref 150–400)
RBC: 2.72 MIL/uL — ABNORMAL LOW (ref 4.22–5.81)
RDW: 14.5 % (ref 11.5–15.5)
WBC: 8.1 10*3/uL (ref 4.0–10.5)
nRBC: 0 % (ref 0.0–0.2)
nRBC: 0 /100 WBC

## 2022-04-25 LAB — BASIC METABOLIC PANEL
Anion gap: 11 (ref 5–15)
BUN: 61 mg/dL — ABNORMAL HIGH (ref 8–23)
CO2: 18 mmol/L — ABNORMAL LOW (ref 22–32)
Calcium: 9.4 mg/dL (ref 8.9–10.3)
Chloride: 112 mmol/L — ABNORMAL HIGH (ref 98–111)
Creatinine, Ser: 2.19 mg/dL — ABNORMAL HIGH (ref 0.61–1.24)
GFR, Estimated: 28 mL/min — ABNORMAL LOW (ref >60)
Glucose, Bld: 149 mg/dL — ABNORMAL HIGH (ref 70–99)
Potassium: 4.8 mmol/L (ref 3.5–5.1)
Sodium: 141 mmol/L (ref 135–145)

## 2022-04-25 LAB — LIPID PANEL
Cholesterol: 89 mg/dL (ref 0–200)
HDL: 26 mg/dL — ABNORMAL LOW (ref >40)
LDL Cholesterol: 51 mg/dL (ref 0–99)
Total CHOL/HDL Ratio: 3.4 RATIO
Triglycerides: 58 mg/dL (ref <150)
VLDL: 12 mg/dL (ref 0–40)

## 2022-04-25 LAB — MAGNESIUM: Magnesium: 2 mg/dL (ref 1.7–2.4)

## 2022-04-25 NOTE — TOC Progression Note (Signed)
Transition of Care [facility]) - Progression Note    Patient Details  Name: Steven Williams MRN: [id number] Date of Birth: 1930/07/29  Transition of Care [facility]) CM/SW [place], LCSW Phone Number:[phone] 04/25/2022, 3:01 PM  Clinical Narrative:    CSW was alerted about pt's pending discharge. CSW called Kitty and her VM is not set up therefore could not leave a message. CSW called [facility]a [facility] and sent a text. CSW called facility and they stated that pt was not in system and the admission staff does not work weekends. MD alerted.  TOC team will continue to assist with discharge planning needs.   Expected Discharge Plan: [place] Barriers to Discharge: Continued Medical Work up  Expected Discharge Plan and Services Expected Discharge Plan: [place]   Discharge Planning Services: CM Consult   Living arrangements for the past 2 months: [place] (Lives in a [place] apartment at [employer])                                       Social Determinants of Health ([place]) Interventions    Readmission Risk Interventions    04/21/2022    9:52 AM  Readmission Risk Prevention Plan  Transportation Screening Complete  Medication Review (RN Care Manager) Complete  PCP or Specialist appointment within 3-5 days of discharge Complete  HRI or [place] Complete  SW Recovery Care/Counseling Consult Complete  Palliative Care Screening Complete  [place] Not Applicable

## 2022-04-25 NOTE — Progress Notes (Signed)
Progress Note    Steven Williams   [id number]:[id number]  DOB: [birth year]  DOA: 04/18/2022     7 PCP: Ginger Organ., MD  Initial CC: left foot/leg pain  Hospital Course: Steven Williams is a pleasant 88 y.o. male with medical history significant for symptomatic bradycardia with pacer, CKD stage IV, chronic anemia and thrombocytopenia, thoracic aortic aneurysm followed by CT surgery, and paroxysmal atrial fibrillation off of anticoagulation for the past month due to severe epistaxis, presented to the ED with worsening left lower leg pain, swelling, and erythema over the past few days accompanied by fatigue and chills. In the ED his creatinine was 3.2, BUN of 101, Dopplers were negative for DVT. He was started on antibiotics for cellulitis. Due to ongoing pain in his leg he also underwent assessment with ABIs.  This was grossly abnormal and he underwent vascular surgery consultation followed by angiogram of the left lower extremity.  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  He was started on aspirin and statin.  Interval History:  No events overnight.  He is awake and alert this morning.   Called and updated daughter this afternoon as well.   Assessment and Plan:  Left leg cellulitis  - Presents with ~1 wk of increased left lower leg pain, swelling, and redness  - Venous US negative for DVT  - still very TTP with ongoing weeping intermittently but this seems improved some with some improvement in swelling since admission  -CT left foot obtained on 04/21/2022 showing diffuse soft tissue swelling, worse dorsally.  No abscess noted no other acute osseous abnormality -Continue Rocephin; complete 7 days and stop - see below (ABIs abnormal)  PAD - he does endorse what sounds like claudication and he continues to mention some pain in his right lower extremity not just his left -ABIs performed and show grossly abnormal values in both legs -Vascular surgery consulted for  assistance with further evaluation - underwent LLE angiogram which showed occlusion of anterior and posterior tibial arteries -Patient started on aspirin and statin - monitor for any epistaxis on asa (had bleeding on coumadin) - LDL 42 from lipid panel 8/17 - outpatient referral to podiatry at discharge; follow up with [facility] in 1 month as well  Physical deconditioning - Likely worsened in setting of cellulitis and inability to bear weight well - Continue working with PT as able - So far SNF recommended   AKI superimposed on CKD IV  - BUN 101 and SCr 3.27 in ED, up from 48 & 2.59 three weeks ago, baseline appears to be 2.5, renal ultrasound is negative -Creatinine improving with hydration, Lasix on hold; s/p IVF   PAF  - Has been off of warfarin for ~1 month d/t severe recurrent epistaxis  - Continue digoxin, continue cardiology follow-up as planned to discuss resumption of anticoagulation     HTN  - Continue Coreg     Chronic HFpEF  - Appears compensated  -Lasix on hold   Anemia; thrombocytopenia  - Appears stable, no bleeding     Hypokalemia -Replace as needed   Old records reviewed in assessment of this patient  Antimicrobials: Rocephin 8/13 >> 8/19  DVT prophylaxis:  heparin injection 5,000 Units Start: 04/24/22 2200 Place and maintain sequential compression device Start: 04/19/22 1040   Code Status:   Code Status: DNR  Mobility Assessment (last 72 hours)     Mobility Assessment     Row Name 04/24/22 2000 04/24/22 1501 04/23/22 2219 04/22/22 2105  Does patient have an order for bedrest or is patient medically unstable No - Continue assessment No - Continue assessment No - Continue assessment No - Continue assessment    What is the highest level of mobility based on the progressive mobility assessment? Level 1 (Bedfast) - Unable to balance while sitting on edge of bed Level 1 (Bedfast) - Unable to balance while sitting on edge of bed  Pt on bedrest until 1800  Level 3 (Stands with assist) - Balance while standing  and cannot march in place Level 3 (Stands with assist) - Balance while standing  and cannot march in place    Is the above level different from baseline mobility prior to current illness? Yes - Recommend PT order Yes - Recommend PT order Yes - Recommend PT order Yes - Recommend PT order             Barriers to discharge: none Disposition Plan:  SNF Status is: Inpt  Objective: Blood pressure (!) 128/57, pulse 77, temperature (!) 97.4 F (36.3 C), temperature source Oral, resp. rate 16, height '6\' 2"'$  (1.88 m), weight 83.3 kg, SpO2 100 %.  Examination:  Physical Exam Constitutional:      General: He is not in acute distress.    Appearance: Normal appearance.  HENT:     Head: Normocephalic and atraumatic.     Mouth/Throat:     Mouth: Mucous membranes are moist.  Eyes:     Extraocular Movements: Extraocular movements intact.  Cardiovascular:     Rate and Rhythm: Normal rate and regular rhythm.  Pulmonary:     Effort: Pulmonary effort is normal. No respiratory distress.     Breath sounds: Normal breath sounds. No wheezing.  Abdominal:     General: Bowel sounds are normal. There is no distension.     Palpations: Abdomen is soft.     Tenderness: There is no abdominal tenderness.  Musculoskeletal:        General: Swelling present.     Cervical back: Normal range of motion and neck supple.     Comments: LLE now wrapped in dressing and still TTP. Edema a little improved but still present   Skin:    Comments: Diffuse flaking of skin in B/L LE  Neurological:     General: No focal deficit present.     Mental Status: He is alert.  Psychiatric:        Mood and Affect: Mood normal.    Pic taken 8/16:    Consultants:    Procedures:    Data Reviewed: Results for orders placed or performed during the hospital encounter of 04/18/22 (from the past 24 hour(s))  Basic metabolic panel     Status: Abnormal   Collection Time:  04/25/22  2:05 AM  Result Value Ref Range   Sodium 141 135 - 145 mmol/L   Potassium 4.8 3.5 - 5.1 mmol/L   Chloride 112 (H) 98 - 111 mmol/L   CO2 18 (L) 22 - 32 mmol/L   Glucose, Bld 149 (H) 70 - 99 mg/dL   BUN 61 (H) 8 - 23 mg/dL   Creatinine, Ser 2.19 (H) 0.61 - 1.24 mg/dL   Calcium 9.4 8.9 - 10.3 mg/dL   GFR, Estimated 28 (L) >60 mL/min   Anion gap 11 5 - 15  CBC with Differential/Platelet     Status: Abnormal   Collection Time: 04/25/22  2:05 AM  Result Value Ref Range   WBC 8.1 4.0 - 10.5 K/uL  RBC 2.72 (L) 4.22 - 5.81 MIL/uL   Hemoglobin 9.2 (L) 13.0 - 17.0 g/dL   HCT 27.9 (L) 39.0 - 52.0 %   MCV 102.6 (H) 80.0 - 100.0 fL   MCH 33.8 26.0 - 34.0 pg   MCHC 33.0 30.0 - 36.0 g/dL   RDW 14.5 11.5 - 15.5 %   Platelets 199 150 - 400 K/uL   nRBC 0.0 0.0 - 0.2 %   Neutrophils Relative % 97 %   Neutro Abs 7.9 (H) 1.7 - 7.7 K/uL   Lymphocytes Relative 2 %   Lymphs Abs 0.2 (L) 0.7 - 4.0 K/uL   Monocytes Relative 0 %   Monocytes Absolute 0.0 (L) 0.1 - 1.0 K/uL   Eosinophils Relative 0 %   Eosinophils Absolute 0.0 0.0 - 0.5 K/uL   Basophils Relative 0 %   Basophils Absolute 0.0 0.0 - 0.1 K/uL   nRBC 0 0 /100 WBC   Metamyelocytes Relative 1 %   Abs Immature Granulocytes 0.10 (H) 0.00 - 0.07 K/uL  Magnesium     Status: None   Collection Time: 04/25/22  2:05 AM  Result Value Ref Range   Magnesium 2.0 1.7 - 2.4 mg/dL  Lipid panel     Status: Abnormal   Collection Time: 04/25/22  2:05 AM  Result Value Ref Range   Cholesterol 89 0 - 200 mg/dL   Triglycerides 58 <150 mg/dL   HDL 26 (L) >40 mg/dL   Total CHOL/HDL Ratio 3.4 RATIO   VLDL 12 0 - 40 mg/dL   LDL Cholesterol 51 0 - 99 mg/dL    I have Reviewed nursing notes, Vitals, and Lab results since pt's last encounter. Pertinent lab results : see above I have ordered test including BMP, CBC, Mg I have reviewed the last note from staff over past 24 hours I have discussed pt's care plan and test results with nursing staff, case  manager   LOS: 7 days   Dwyane Dee, MD [facility] 04/25/2022, 2:26 PM

## 2022-04-25 NOTE — Progress Notes (Signed)
PHARMACIST LIPID MONITORING   Steven Williams is a 88 y.o. male admitted on 04/18/2022 with PAD.  Pharmacy has been consulted to optimize lipid-lowering therapy with the indication of secondary prevention for clinical ASCVD.  Recent Labs:  Lipid Panel (last 6 months):   Lab Results  Component Value Date   CHOL 89 04/25/2022   TRIG 58 04/25/2022   HDL 26 (L) 04/25/2022   CHOLHDL 3.4 04/25/2022   VLDL 12 04/25/2022   LDLCALC 51 04/25/2022    Hepatic function panel (last 6 months):   Lab Results  Component Value Date   AST 32 04/18/2022   ALT 23 04/18/2022   ALKPHOS 200 (H) 04/18/2022   BILITOT 1.1 04/18/2022    SCr (since admission):   Serum creatinine: 2.19 mg/dL (H) 04/25/22 0205 Estimated creatinine clearance: 26.1 mL/min (A)  Current therapy and lipid therapy tolerance Current lipid-lowering therapy: atorvastatin 40 mg daily  Previous lipid-lowering therapies (if applicable): rosuvastatin 10 mg daily  Documented or reported allergies or intolerances to lipid-lowering therapies (if applicable): N/A  Assessment:   Patient agrees with changes to lipid-lowering therapy  Plan:    1.Statin intensity (high intensity recommended for all patients regardless of the LDL):  No statin changes. The patient is already on a high intensity statin.  2.Add ezetimibe (if any one of the following):   Not indicated at this time.  3.Refer to lipid clinic:   No  4.Follow-up with:  Primary care provider - Ginger Organ., MD  5.Follow-up labs after discharge:  No changes in lipid therapy, repeat a lipid panel in one year.       Eliseo Gum, PharmD PGY1 Pharmacy Resident   04/25/2022  1:53 PM

## 2022-04-25 NOTE — Progress Notes (Addendum)
[facility]  Subjective  -  no new complaints very pleasant   Objective 124/71 87 (!) 97.4 F (36.3 C) (Oral) 15 (!) 85%  Intake/Output Summary (Last 24 hours) at 04/25/2022 0911 Last data filed at 04/25/2022 3557 Gross per 24 hour  Intake 340 ml  Output 400 ml  Net -60 ml   Left foot ulcer, dry dressing in place Right groin soft without hematoma Lungs non labored breathing O2 support 2 L    Assessment/Planning: POD #1 left foot dorsal ulcer Angiogram without intervention demonstrates tibial disease with occluded PT, flow in the peroneal to the ankle.  Small vessel disease in the foot. Patient optimized from a vascular standpoint. Recommend best medical therapy for peripheral arterial disease. Cont ASA and Liptior.    Suggest Podiatry to follow as an OP for foot care.  F/U in our office in 1 month will be arranged.   Roxy Horseman 04/25/2022 9:11 AM --  Laboratory Lab Results: Recent Labs    04/24/22 0646 04/25/22 0205  WBC 9.5 8.1  HGB 8.9* 9.2*  HCT 27.6* 27.9*  PLT 191 199   BMET Recent Labs    04/24/22 0646 04/25/22 0205  NA 142 141  K 4.9 4.8  CL 115* 112*  CO2 20* 18*  GLUCOSE 107* 149*  BUN 61* 61*  CREATININE 2.30* 2.19*  CALCIUM 9.6 9.4    COAG Lab Results  Component Value Date   INR 1.3 (H) 04/18/2022   INR 1.3 (H) 01/11/2022   INR 2.2 (H) 01/10/2022   No results found for: "PTT"   VASCULAR STAFF ADDENDUM: I have independently interviewed and examined the patient. I agree with the above.  Small vessel disease. Optimized from vascular standpoint. Needs podiatry for outpatient high risk foot care. Will see again in 1 month in office. Please call for questions.  Yevonne Aline. Stanford Breed, MD [facility] Phone Number: [phone] 04/25/2022 4:16 PM

## 2022-04-26 DIAGNOSIS — I739 Peripheral vascular disease, unspecified: Secondary | ICD-10-CM | POA: Diagnosis not present

## 2022-04-26 DIAGNOSIS — L03116 Cellulitis of left lower limb: Secondary | ICD-10-CM | POA: Diagnosis not present

## 2022-04-26 LAB — CBC WITH DIFFERENTIAL/PLATELET
Abs Immature Granulocytes: 0.09 10*3/uL — ABNORMAL HIGH (ref 0.00–0.07)
Basophils Absolute: 0 10*3/uL (ref 0.0–0.1)
Basophils Relative: 0 %
Eosinophils Absolute: 0 10*3/uL (ref 0.0–0.5)
Eosinophils Relative: 0 %
HCT: 29 % — ABNORMAL LOW (ref 39.0–52.0)
Hemoglobin: 9.4 g/dL — ABNORMAL LOW (ref 13.0–17.0)
Immature Granulocytes: 1 %
Lymphocytes Relative: 6 %
Lymphs Abs: 0.7 10*3/uL (ref 0.7–4.0)
MCH: 33.6 pg (ref 26.0–34.0)
MCHC: 32.4 g/dL (ref 30.0–36.0)
MCV: 103.6 fL — ABNORMAL HIGH (ref 80.0–100.0)
Monocytes Absolute: 0.4 10*3/uL (ref 0.1–1.0)
Monocytes Relative: 4 %
Neutro Abs: 10.2 10*3/uL — ABNORMAL HIGH (ref 1.7–7.7)
Neutrophils Relative %: 89 %
Platelets: 207 10*3/uL (ref 150–400)
RBC: 2.8 MIL/uL — ABNORMAL LOW (ref 4.22–5.81)
RDW: 14.6 % (ref 11.5–15.5)
WBC: 11.4 10*3/uL — ABNORMAL HIGH (ref 4.0–10.5)
nRBC: 0 % (ref 0.0–0.2)

## 2022-04-26 LAB — MAGNESIUM: Magnesium: 2 mg/dL (ref 1.7–2.4)

## 2022-04-26 LAB — BASIC METABOLIC PANEL
Anion gap: 10 (ref 5–15)
BUN: 76 mg/dL — ABNORMAL HIGH (ref 8–23)
CO2: 15 mmol/L — ABNORMAL LOW (ref 22–32)
Calcium: 9 mg/dL (ref 8.9–10.3)
Chloride: 111 mmol/L (ref 98–111)
Creatinine, Ser: 2.38 mg/dL — ABNORMAL HIGH (ref 0.61–1.24)
GFR, Estimated: 25 mL/min — ABNORMAL LOW (ref >60)
Glucose, Bld: 131 mg/dL — ABNORMAL HIGH (ref 70–99)
Potassium: 4.8 mmol/L (ref 3.5–5.1)
Sodium: 136 mmol/L (ref 135–145)

## 2022-04-26 MED ORDER — POLYETHYLENE GLYCOL 3350 17 G PO PACK
17.0000 g | PACK | Freq: Every day | ORAL | Status: DC
  Administered 2022-04-26: 17 g via ORAL
  Filled 2022-04-26 (×2): qty 1

## 2022-04-26 MED ORDER — SENNOSIDES-DOCUSATE SODIUM 8.6-50 MG PO TABS
1.0000 | ORAL_TABLET | Freq: Two times a day (BID) | ORAL | Status: DC
  Administered 2022-04-26 (×2): 1 via ORAL
  Filled 2022-04-26 (×3): qty 1

## 2022-04-26 MED ORDER — LACTULOSE 10 GM/15ML PO SOLN
30.0000 g | Freq: Two times a day (BID) | ORAL | Status: DC | PRN
Start: 2022-04-26 — End: 2022-04-28
  Administered 2022-04-26: 30 g via ORAL
  Filled 2022-04-26: qty 45

## 2022-04-26 MED ORDER — LACTATED RINGERS IV SOLN: INTRAVENOUS | Status: DC

## 2022-04-26 NOTE — Progress Notes (Signed)
Progress Note    Steven Williams   YQI:[id number]  DOB: Sep 26, 1929  DOA: 04/18/2022     8 PCP: Ginger Organ., MD  Initial CC: left foot/leg pain  Hospital Course: Steven Williams is a pleasant 88 y.o. male with medical history significant for symptomatic bradycardia with pacer, CKD stage IV, chronic anemia and thrombocytopenia, thoracic aortic aneurysm followed by CT surgery, and paroxysmal atrial fibrillation off of anticoagulation for the past month due to severe epistaxis, presented to the ED with worsening left lower leg pain, swelling, and erythema over the past few days accompanied by fatigue and chills. In the ED his creatinine was 3.2, BUN of 101, Dopplers were negative for DVT. He was started on antibiotics for cellulitis. Due to ongoing pain in his leg he also underwent assessment with ABIs.  This was grossly abnormal and he underwent vascular surgery consultation followed by angiogram of the left lower extremity.  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  He was started on aspirin and statin.  Interval History:  No events overnight.  He is awake and alert this morning.   Daughter present bedside this morning. His pain is overall improved compared to admission.  Still present and he is still rather deconditioned.  Only took a couple steps with mobility specialist. Tentative plan is for discharging to rehab tomorrow.  Assessment and Plan:  Left leg cellulitis  - Presents with ~1 wk of increased left lower leg pain, swelling, and redness  - Venous US negative for DVT  - still very TTP with ongoing weeping intermittently but this seems improved some with some improvement in swelling since admission  -CT left foot obtained on 04/21/2022 showing diffuse soft tissue swelling, worse dorsally.  No abscess noted no other acute osseous abnormality -Continue Rocephin; complete 7 days and stop - see below (ABIs abnormal)  PAD - he does endorse what sounds like  claudication and he continues to mention some pain in his right lower extremity not just his left -ABIs performed and show grossly abnormal values in both legs -Vascular surgery consulted for assistance with further evaluation - underwent LLE angiogram which showed occlusion of anterior and posterior tibial arteries -Patient started on aspirin and statin - monitor for any epistaxis on asa (had bleeding on coumadin) - LDL 42 from lipid panel 8/17 - outpatient referral to podiatry at discharge; follow up with [facility] in 1 month as well  Physical deconditioning - Likely worsened in setting of cellulitis and inability to bear weight well - Continue working with PT as able - SNF recommended   AKI superimposed on CKD IV  - BUN 101 and SCr 3.27 in ED, up from 48 & 2.59 three weeks ago, baseline appears to be 2.5, renal ultrasound is negative -Creatinine improving with hydration, Lasix on hold; s/p IVF   PAF  - Has been off of warfarin for ~1 month d/t severe recurrent epistaxis  - Continue digoxin, continue cardiology follow-up as planned to discuss resumption of anticoagulation     HTN  - Continue Coreg     Chronic HFpEF  - Appears compensated  -Lasix on hold   Anemia; thrombocytopenia  - Appears stable, no bleeding     Hypokalemia -Replace as needed   Old records reviewed in assessment of this patient  Antimicrobials: Rocephin 8/13 >> 8/19  DVT prophylaxis:  heparin injection 5,000 Units Start: 04/24/22 2200 Place and maintain sequential compression device Start: 04/19/22 1040   Code Status:   Code Status:  DNR  Mobility Assessment (last 72 hours)     Mobility Assessment     Row Name 04/26/22 1000 04/25/22 2015 04/25/22 0800 04/24/22 2000 04/24/22 1501   Does patient have an order for bedrest or is patient medically unstable No - Continue assessment No - Continue assessment No - Continue assessment No - Continue assessment No - Continue assessment   What is the highest  level of mobility based on the progressive mobility assessment? Level 3 (Stands with assist) - Balance while standing  and cannot march in place Level 1 (Bedfast) - Unable to balance while sitting on edge of bed Level 1 (Bedfast) - Unable to balance while sitting on edge of bed Level 1 (Bedfast) - Unable to balance while sitting on edge of bed Level 1 (Bedfast) - Unable to balance while sitting on edge of bed  Pt on bedrest until 1800   Is the above level different from baseline mobility prior to current illness? Yes - Recommend PT order Yes - Recommend PT order Yes - Recommend PT order Yes - Recommend PT order Yes - Recommend PT order    [place] Name 04/23/22 2219           Does patient have an order for bedrest or is patient medically unstable No - Continue assessment       What is the highest level of mobility based on the progressive mobility assessment? Level 3 (Stands with assist) - Balance while standing  and cannot march in place       Is the above level different from baseline mobility prior to current illness? Yes - Recommend PT order                Barriers to discharge: none Disposition Plan:  SNF Status is: Inpt  Objective: Blood pressure 111/70, pulse 72, temperature (!) 97.4 F (36.3 C), temperature source Oral, resp. rate 18, height '6\' 2"'$  (1.88 m), weight 83.3 kg, SpO2 99 %.  Examination:  Physical Exam Constitutional:      General: He is not in acute distress.    Appearance: Normal appearance.  HENT:     Head: Normocephalic and atraumatic.     Mouth/Throat:     Mouth: Mucous membranes are moist.  Eyes:     Extraocular Movements: Extraocular movements intact.  Cardiovascular:     Rate and Rhythm: Normal rate and regular rhythm.  Pulmonary:     Effort: Pulmonary effort is normal. No respiratory distress.     Breath sounds: Normal breath sounds. No wheezing.  Abdominal:     General: Bowel sounds are normal. There is no distension.     Palpations: Abdomen is soft.      Tenderness: There is no abdominal tenderness.  Musculoskeletal:        General: Swelling present.     Cervical back: Normal range of motion and neck supple.     Comments: LLE now wrapped in dressing and still TTP. Edema improved but still present (trace)  Skin:    Comments: Diffuse flaking of skin in B/L LE  Neurological:     General: No focal deficit present.     Mental Status: He is alert.  Psychiatric:        Mood and Affect: Mood normal.    Pic taken 8/16:    Consultants:    Procedures:    Data Reviewed: Results for orders placed or performed during the hospital encounter of 04/18/22 (from the past 24 hour(s))  Basic metabolic panel  Status: Abnormal   Collection Time: 04/26/22  3:28 AM  Result Value Ref Range   Sodium 136 135 - 145 mmol/L   Potassium 4.8 3.5 - 5.1 mmol/L   Chloride 111 98 - 111 mmol/L   CO2 15 (L) 22 - 32 mmol/L   Glucose, Bld 131 (H) 70 - 99 mg/dL   BUN 76 (H) 8 - 23 mg/dL   Creatinine, Ser 2.38 (H) 0.61 - 1.24 mg/dL   Calcium 9.0 8.9 - 10.3 mg/dL   GFR, Estimated 25 (L) >60 mL/min   Anion gap 10 5 - 15  CBC with Differential/Platelet     Status: Abnormal   Collection Time: 04/26/22  3:28 AM  Result Value Ref Range   WBC 11.4 (H) 4.0 - 10.5 K/uL   RBC 2.80 (L) 4.22 - 5.81 MIL/uL   Hemoglobin 9.4 (L) 13.0 - 17.0 g/dL   HCT 29.0 (L) 39.0 - 52.0 %   MCV 103.6 (H) 80.0 - 100.0 fL   MCH 33.6 26.0 - 34.0 pg   MCHC 32.4 30.0 - 36.0 g/dL   RDW 14.6 11.5 - 15.5 %   Platelets 207 150 - 400 K/uL   nRBC 0.0 0.0 - 0.2 %   Neutrophils Relative % 89 %   Neutro Abs 10.2 (H) 1.7 - 7.7 K/uL   Lymphocytes Relative 6 %   Lymphs Abs 0.7 0.7 - 4.0 K/uL   Monocytes Relative 4 %   Monocytes Absolute 0.4 0.1 - 1.0 K/uL   Eosinophils Relative 0 %   Eosinophils Absolute 0.0 0.0 - 0.5 K/uL   Basophils Relative 0 %   Basophils Absolute 0.0 0.0 - 0.1 K/uL   Immature Granulocytes 1 %   Abs Immature Granulocytes 0.09 (H) 0.00 - 0.07 K/uL  Magnesium      Status: None   Collection Time: 04/26/22  3:28 AM  Result Value Ref Range   Magnesium 2.0 1.7 - 2.4 mg/dL    I have Reviewed nursing notes, Vitals, and Lab results since pt's last encounter. Pertinent lab results : see above I have ordered test including BMP, CBC, Mg I have reviewed the last note from staff over past 24 hours I have discussed pt's care plan and test results with nursing staff, case manager   LOS: 8 days   Dwyane Dee, MD [facility] 04/26/2022, 2:40 PM

## 2022-04-26 NOTE — TOC Progression Note (Signed)
Transition of Care [facility]) - Progression Note    Patient Details  Name: Steven Williams MRN: [id number] Date of Birth: August 15, 1930  Transition of Care [facility]) CM/SW [place], LCSW Phone Number: [phone] 04/26/2022, 9:15 AM  Clinical Narrative:     CSW attempted to call Perrin Smack again and had to leave a message. CSW is a call back.  TOC team will continue to assist with discharge planning needs.   Expected Discharge Plan: [place] Barriers to Discharge: Continued Medical Work up  Expected Discharge Plan and Services Expected Discharge Plan: [place]   Discharge Planning Services: CM Consult   Living arrangements for the past 2 months: [place] (Lives in a [place] apartment at [employer])                                       Social Determinants of Health ([place]) Interventions    Readmission Risk Interventions    04/21/2022    9:52 AM  Readmission Risk Prevention Plan  Transportation Screening Complete  Medication Review (RN Care Manager) Complete  PCP or Specialist appointment within 3-5 days of discharge Complete  HRI or [place] Complete  SW Recovery Care/Counseling Consult Complete  Palliative Care Screening Complete  [place] Not Applicable

## 2022-04-26 NOTE — Progress Notes (Signed)
Mobility Specialist: Progress Note   04/26/22 1116  Mobility  Activity Stood at bedside  Level of Assistance Moderate assist, patient does 50-74%  Assistive Device Front wheel walker  Distance Ambulated (ft) 2 ft  Activity Response Tolerated well  $Mobility charge 1 Mobility   Pre-Mobility: 65 HR, 110/49 (63) BP Post-Mobility: 70 HR, 114/58 (73) BP  Pt received in the bed and agreeable to mobility. Pt states he uses a RW or cane at home but is limited with his mobility. Attends an exercise class 5x/week. Mod I with bed mobility and heavy modA to stand. Significant posterior lean upon standing, corrected with verbal cues and physical assist. Able to take a step to/from the bed and side step towards HOB. Pt back to bed after session with call bell and phone at his side.   [facility] Haidyn [place] Mobility Specialist Mobility Specialist [address]: [phone]

## 2022-04-27 ENCOUNTER — Encounter (HOSPITAL_COMMUNITY): Payer: Self-pay | Admitting: Vascular Surgery

## 2022-04-27 DIAGNOSIS — L03116 Cellulitis of left lower limb: Secondary | ICD-10-CM | POA: Diagnosis not present

## 2022-04-27 DIAGNOSIS — I739 Peripheral vascular disease, unspecified: Secondary | ICD-10-CM | POA: Diagnosis not present

## 2022-04-27 DIAGNOSIS — N184 Chronic kidney disease, stage 4 (severe): Secondary | ICD-10-CM | POA: Diagnosis not present

## 2022-04-27 DIAGNOSIS — N179 Acute kidney failure, unspecified: Secondary | ICD-10-CM | POA: Diagnosis not present

## 2022-04-27 MED ORDER — GABAPENTIN 300 MG PO CAPS: 300.0000 mg | ORAL_CAPSULE | Freq: Two times a day (BID) | ORAL | Status: DC

## 2022-04-27 MED ORDER — ASPIRIN 81 MG PO TBEC: 81.0000 mg | DELAYED_RELEASE_TABLET | Freq: Every day | ORAL | 12 refills | Status: DC

## 2022-04-27 MED ORDER — ALLOPURINOL 100 MG PO TABS: 50.0000 mg | ORAL_TABLET | ORAL | Status: DC

## 2022-04-27 MED ORDER — IRON 325 (65 FE) MG PO TABS: 1.0000 | ORAL_TABLET | Freq: Every day | ORAL | 0 refills | Status: DC

## 2022-04-27 MED ORDER — SENNOSIDES-DOCUSATE SODIUM 8.6-50 MG PO TABS: 1.0000 | ORAL_TABLET | Freq: Two times a day (BID) | ORAL | Status: DC

## 2022-04-27 MED ORDER — ROPINIROLE HCL 1 MG PO TABS: 3.0000 mg | ORAL_TABLET | Freq: Every day | ORAL | 5 refills | Status: DC

## 2022-04-27 MED ORDER — ATORVASTATIN CALCIUM 40 MG PO TABS: 40.0000 mg | ORAL_TABLET | Freq: Every day | ORAL | Status: DC

## 2022-04-27 MED ORDER — ACETAMINOPHEN 500 MG PO TABS: 1000.0000 mg | ORAL_TABLET | Freq: Four times a day (QID) | ORAL | 0 refills | Status: DC | PRN

## 2022-04-27 MED ORDER — TRAMADOL HCL 50 MG PO TABS
50.0000 mg | ORAL_TABLET | Freq: Three times a day (TID) | ORAL | 0 refills | Status: DC | PRN
Start: 2022-04-27 — End: 2022-05-15

## 2022-04-27 NOTE — Discharge Summary (Signed)
Physician Discharge Summary   Steven Williams ENI:[id number] DOB: Dec 03, 1929 DOA: 04/18/2022  PCP: Steven Williams., MD  Admit date: 04/18/2022 Discharge date:  04/27/2022 Barriers to discharge: none  Admitted From: Home Disposition:  SNF Discharging physician: Steven Dee, MD  Recommendations for Outpatient Follow-up:  Follow up with vascular surgery Referral to podiatry placed  Home Health:  Equipment/Devices:   Discharge Condition: stable CODE STATUS: DNR Diet recommendation:  Diet Orders (From admission, onward)     Start     Ordered   04/27/22 0000  Diet general        04/27/22 1207   04/24/22 1509  Diet renal/carb modified with fluid restriction Diet-HS Snack? Nothing; Fluid restriction: 1200 mL Fluid; Room service appropriate? Yes; Fluid consistency: Thin  Diet effective now       Question Answer Comment  Diet-HS Snack? Nothing   Fluid restriction: 1200 mL Fluid   Room service appropriate? Yes   Fluid consistency: Thin      04/24/22 1508            Hospital Course: Steven Williams is a pleasant 88 y.o. male with medical history significant for symptomatic bradycardia with pacer, CKD stage IV, chronic anemia and thrombocytopenia, thoracic aortic aneurysm followed by CT surgery, and paroxysmal atrial fibrillation off of anticoagulation for the past month due to severe epistaxis, presented to the ED with worsening left lower leg pain, swelling, and erythema over the past few days accompanied by fatigue and chills. In the ED his creatinine was 3.2, BUN of 101, Dopplers were negative for DVT. He was started on antibiotics for cellulitis. Due to ongoing pain in his leg he also underwent assessment with ABIs.  This was grossly abnormal and he underwent vascular surgery consultation followed by angiogram of the left lower extremity.  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  He was started on aspirin and statin.  Assessment and Plan:  Left leg  cellulitis - resolved  - Presents with ~1 wk of increased left lower leg pain, swelling, and redness  - Venous US negative for DVT  - still very TTP with ongoing weeping intermittently but this seems improved some with some improvement in swelling since admission  -CT left foot obtained on 04/21/2022 showing diffuse soft tissue swelling, worse dorsally.  No abscess noted no other acute osseous abnormality -completed 7 days rocephin  - see below (ABIs abnormal)   PAD - he does endorse what sounds like claudication and he continues to mention some pain in his right lower extremity not just his left -ABIs performed and show grossly abnormal values in both legs -Vascular surgery consulted for assistance with further evaluation - underwent LLE angiogram which showed occlusion of anterior and posterior tibial arteries -Patient started on aspirin and statin - monitor for any epistaxis on asa (had bleeding on coumadin) - LDL 42 from lipid panel 8/17 - outpatient referral to podiatry at discharge; follow up with [facility] in 1 month as well   Physical deconditioning - Likely worsened in setting of cellulitis and inability to bear weight well - Continue working with PT as able - SNF recommended   AKI superimposed on CKD IV  - BUN 101 and SCr 3.27 in ED, up from 48 & 2.59 three weeks ago, baseline appears to be 2.5, renal ultrasound is negative -Creatinine improving with hydration, Lasix on hold; s/p IVF   PAF  - Has been off of warfarin for ~1 month d/t severe recurrent epistaxis  - Continue  digoxin, continue cardiology follow-up as planned to discuss possible resumption of anticoagulation     HTN  - Continue Coreg     Chronic HFpEF  - Appears compensated  -Lasix on hold   Anemia; thrombocytopenia  - Appears stable, no bleeding     Hypokalemia -Replaced   Principal Diagnosis: Cellulitis of left leg  Discharge Diagnoses: Active Hospital Problems   Diagnosis Date Noted   Cellulitis of  left leg 04/18/2022   PAD (peripheral artery disease) ([place]) 04/24/2022   Acute renal failure superimposed on stage 4 chronic kidney disease ([place]) 04/18/2022   Chronic diastolic CHF (congestive heart failure) ([place]) 04/18/2022   Recurrent epistaxis 06/26/2020   Thrombocytopenia ([place]) 11/08/2018   Rheumatoid arthritis ([place]) 07/27/2018   Paroxysmal atrial fibrillation ([place]) 07/27/2018   Hypothyroidism 07/27/2018   Essential hypertension 07/27/2018   Chronic anemia 07/27/2018    Resolved Hospital Problems  No resolved problems to display.     Discharge Instructions     Ambulatory referral to Podiatry   Complete by: As directed    Diet general   Complete by: As directed    Discharge wound care:   Complete by: As directed    Apply medihoney to the linear area along distal pretibial area of the LLE. Top with dressing (gauze or ABD pad), wrap with kerlix, secure with tape. Change daily.   Increase activity slowly   Complete by: As directed       Allergies as of 04/27/2022       Reactions   Iodinated Contrast Media Rash        Medication List     STOP taking these medications    acetaminophen 650 MG CR tablet Commonly known as: TYLENOL Replaced by: acetaminophen 500 MG tablet   furosemide 40 MG tablet Commonly known as: LASIX   pregabalin 75 MG capsule Commonly known as: LYRICA       TAKE these medications    acetaminophen 500 MG tablet Commonly known as: TYLENOL Take 2 tablets (1,000 mg total) by mouth every 6 (six) hours as needed. Replaces: acetaminophen 650 MG CR tablet   allopurinol 100 MG tablet Commonly known as: ZYLOPRIM Take 0.5 tablets (50 mg total) by mouth every other day. What changed:  how much to take when to take this   aspirin EC 81 MG tablet Take 1 tablet (81 mg total) by mouth daily. Swallow whole. Start taking on: April 28, 2022   atorvastatin 40 MG tablet Commonly known as: LIPITOR Take 1 tablet (40 mg total) by mouth  daily. Start taking on: April 28, 2022   carvedilol 6.25 MG tablet Commonly known as: COREG Take 6.25 mg by mouth 2 (two) times daily with a meal.   cyanocobalamin 1000 MCG tablet Commonly known as: VITAMIN B12 Take 1 tablet (1,000 mcg total) by mouth daily.   digoxin 0.125 MG tablet Commonly known as: LANOXIN Take 0.125 mg by mouth every Monday, Wednesday, and Friday.   gabapentin 300 MG capsule Commonly known as: NEURONTIN Take 1 capsule (300 mg total) by mouth 2 (two) times daily. What changed:  medication strength how much to take when to take this   hydrocortisone 2.5 % rectal cream Commonly known as: ANUSOL-HC Apply 1 application topically 2 (two) times daily as needed for hemorrhoids or anal itching.   Iron 325 (65 Fe) MG Tabs Take 1 tablet (325 mg total) by mouth daily with breakfast. What changed: when to take this   levothyroxine 100 MCG tablet Commonly known  as: SYNTHROID Take 100 mcg by mouth daily before breakfast.   pantoprazole 40 MG tablet Commonly known as: PROTONIX Take 40 mg by mouth daily before breakfast.   PRESERVISION AREDS PO Take 1 capsule by mouth 2 (two) times daily.   REFRESH OP Place 1 drop into both eyes 4 (four) times daily.   rOPINIRole 1 MG tablet Commonly known as: REQUIP Take 3 tablets (3 mg total) by mouth at bedtime. What changed: how much to take   saw palmetto 500 MG capsule Take 500 mg by mouth daily.   senna-docusate 8.6-50 MG tablet Commonly known as: Senokot-S Take 1 tablet by mouth 2 (two) times daily.   sodium chloride 0.65 % Soln nasal spray Commonly known as: OCEAN Place 2 sprays into both nostrils every 4 (four) hours while awake.   tamsulosin 0.4 MG Caps capsule Commonly known as: FLOMAX Take 0.4 mg by mouth daily.   traMADol 50 MG tablet Commonly known as: ULTRAM Take 1 tablet (50 mg total) by mouth every 8 (eight) hours as needed for moderate pain.   Vitamin D3 50 MCG (2000 UT) Tabs Take 2,000  Units by mouth daily.               Discharge Care Instructions  (From admission, onward)           Start     Ordered   04/27/22 0000  Discharge wound care:       Comments: Apply medihoney to the linear area along distal pretibial area of the LLE. Top with dressing (gauze or ABD pad), wrap with kerlix, secure with tape. Change daily.   04/27/22 1207            Contact information for follow-up providers     Cherre Robins, MD Follow up in 4 week(s).   Specialties: Vascular Surgery, Interventional Cardiology Why: Office will call you to arrange your appt (sent) Contact information: [place] [address] [phone]              Contact information for after-discharge care     Destination     [facility] Preferred SNF .   Service: Skilled Nursing Contact information: [address] [address] [phone]                    Allergies  Allergen Reactions   Iodinated Contrast Media Rash    Consultations: Vascular surgery  Procedures: Angiogram of LLE, 04/24/22  Discharge Exam: BP 117/74 (BP Location: Right Arm)   Pulse 71   Temp (!) 97.4 F (36.3 C)   Resp 14   Ht '6\' 2"'$  (1.88 m)   Wt 88.5 kg   SpO2 97%   BMI 25.05 kg/m  Physical Exam Constitutional:      General: He is not in acute distress.    Appearance: Normal appearance.  HENT:     Head: Normocephalic and atraumatic.     Mouth/Throat:     Mouth: Mucous membranes are moist.  Eyes:     Extraocular Movements: Extraocular movements intact.  Cardiovascular:     Rate and Rhythm: Normal rate and regular rhythm.  Pulmonary:     Effort: Pulmonary effort is normal. No respiratory distress.     Breath sounds: Normal breath sounds. No wheezing.  Abdominal:     General: Bowel sounds are normal. There is no distension.     Palpations: Abdomen is soft.     Tenderness: There  is no abdominal tenderness.   Musculoskeletal:        General: Swelling present.     Cervical back: Normal range of motion and neck supple.     Comments: LLE now wrapped in dressing and still TTP. Edema improved but still present (trace)  Skin:    Comments: Diffuse flaking of skin in B/L LE  Neurological:     General: No focal deficit present.     Mental Status: He is alert.  Psychiatric:        Mood and Affect: Mood normal.      The results of significant diagnostics from this hospitalization (including imaging, microbiology, ancillary and laboratory) are listed below for reference.   Microbiology: Recent Results (from the past 240 hour(s))  Blood culture (routine x 2)     Status: None   Collection Time: 04/18/22  7:00 PM   Specimen: BLOOD  Result Value Ref Range Status   Specimen Description BLOOD RIGHT ANTECUBITAL  Final   Special Requests   Final    BOTTLES DRAWN AEROBIC AND ANAEROBIC Blood Culture adequate volume   Culture   Final    NO GROWTH 5 DAYS Performed at [facility], [address] [place], [place] [address]    Report Status 04/23/2022 FINAL  Final  Blood culture (routine x 2)     Status: None   Collection Time: 04/18/22  7:18 PM   Specimen: BLOOD RIGHT FOREARM  Result Value Ref Range Status   Specimen Description BLOOD RIGHT FOREARM  Final   Special Requests   Final    BOTTLES DRAWN AEROBIC AND ANAEROBIC Blood Culture results may not be optimal due to an excessive volume of blood received in culture bottles   Culture   Final    NO GROWTH 5 DAYS Performed at [facility], [place] [address] [place], [place] [address]    Report Status 04/23/2022 FINAL  Final     Labs: BNP (last 3 results) No results for input(s): "BNP" in the last 8760 hours. Basic Metabolic Panel: Recent Labs  Lab 04/22/22 0621 04/23/22 0451 04/24/22 0646 04/25/22 0205 04/26/22 0328  NA 141 139 142 141 136  K 4.3 4.2 4.9 4.8 4.8  CL 113* 112* 115* 112* 111  CO2 19* 18* 20* 18* 15*  GLUCOSE 98  102* 107* 149* 131*  BUN 63* 63* 61* 61* 76*  CREATININE 2.37* 2.41* 2.30* 2.19* 2.38*  CALCIUM 9.1 9.0 9.6 9.4 9.0  MG 2.0 2.0 2.1 2.0 2.0   Liver Function Tests: No results for input(s): "AST", "ALT", "ALKPHOS", "BILITOT", "PROT", "ALBUMIN" in the last 168 hours. No results for input(s): "LIPASE", "AMYLASE" in the last 168 hours. No results for input(s): "AMMONIA" in the last 168 hours. CBC: Recent Labs  Lab 04/22/22 0621 04/23/22 0451 04/24/22 0646 04/25/22 0205 04/26/22 0328  WBC 12.7* 7.1 9.5 8.1 11.4*  NEUTROABS 11.0* 5.4 7.7 7.9* 10.2*  HGB 9.0* 8.3* 8.9* 9.2* 9.4*  HCT 27.8* 25.4* 27.6* 27.9* 29.0*  MCV 103.3* 101.2* 104.2* 102.6* 103.6*  PLT 163 156 191 199 207   Cardiac Enzymes: No results for input(s): "CKTOTAL", "CKMB", "CKMBINDEX", "TROPONINI" in the last 168 hours. BNP: Invalid input(s): "POCBNP" CBG: No results for input(s): "GLUCAP" in the last 168 hours. D-Dimer No results for input(s): "DDIMER" in the last 72 hours. Hgb A1c No results for input(s): "HGBA1C" in the last 72 hours. Lipid Profile Recent Labs    04/25/22 0205  CHOL 89  HDL 26*  LDLCALC 51  TRIG 58  CHOLHDL 3.4   Thyroid function studies No results for input(s): "TSH", "T4TOTAL", "T3FREE", "THYROIDAB" in the last 72 hours.  Invalid input(s): "FREET3" Anemia work up No results for input(s): "VITAMINB12", "FOLATE", "FERRITIN", "TIBC", "IRON", "RETICCTPCT" in the last 72 hours. Urinalysis    Component Value Date/Time   COLORURINE YELLOW 04/19/2022 1500   APPEARANCEUR HAZY (A) 04/19/2022 1500   LABSPEC 1.014 04/19/2022 1500   PHURINE 5.0 04/19/2022 1500   GLUCOSEU NEGATIVE 04/19/2022 1500   HGBUR NEGATIVE 04/19/2022 1500   BILIRUBINUR NEGATIVE 04/19/2022 1500   KETONESUR NEGATIVE 04/19/2022 1500   PROTEINUR NEGATIVE 04/19/2022 1500   NITRITE NEGATIVE 04/19/2022 1500   LEUKOCYTESUR NEGATIVE 04/19/2022 1500   Sepsis Labs Recent Labs  Lab 04/23/22 0451 04/24/22 0646  04/25/22 0205 04/26/22 0328  WBC 7.1 9.5 8.1 11.4*   Microbiology Recent Results (from the past 240 hour(s))  Blood culture (routine x 2)     Status: None   Collection Time: 04/18/22  7:00 PM   Specimen: BLOOD  Result Value Ref Range Status   Specimen Description BLOOD RIGHT ANTECUBITAL  Final   Special Requests   Final    BOTTLES DRAWN AEROBIC AND ANAEROBIC Blood Culture adequate volume   Culture   Final    NO GROWTH 5 DAYS Performed at [facility], [address] [place], [place] [address]    Report Status 04/23/2022 FINAL  Final  Blood culture (routine x 2)     Status: None   Collection Time: 04/18/22  7:18 PM   Specimen: BLOOD RIGHT FOREARM  Result Value Ref Range Status   Specimen Description BLOOD RIGHT FOREARM  Final   Special Requests   Final    BOTTLES DRAWN AEROBIC AND ANAEROBIC Blood Culture results may not be optimal due to an excessive volume of blood received in culture bottles   Culture   Final    NO GROWTH 5 DAYS Performed at [facility], [place] [address] [place], [place] [address]    Report Status 04/23/2022 FINAL  Final    Procedures/Studies: PERIPHERAL VASCULAR CATHETERIZATION  Result Date: 04/24/2022 DATE OF SERVICE: 04/24/2022  PATIENT:  Jeneen Rinks Skiver  88 y.o. male  PRE-OPERATIVE DIAGNOSIS:  Atherosclerosis of native arteries of left lower extremity causing ulceration  POST-OPERATIVE DIAGNOSIS:  Same  PROCEDURE:  1) Ultrasound guided right common femoral artery access 2) Aortogram 3) Left lower extremity angiogram with third order cannulation  SURGEON:  Yevonne Aline. Stanford Breed, MD  ASSISTANT: none  ANESTHESIA:   local  ESTIMATED BLOOD LOSS: minimal  LOCAL MEDICATIONS USED:  LIDOCAINE  COUNTS: confirmed correct.  PATIENT DISPOSITION:  PACU - hemodynamically stable.  Delay start of Pharmacological VTE agent (>24hrs) due to surgical blood loss or risk of bleeding: no  INDICATION FOR PROCEDURE: Paulo Keimig is a 88 y.o. male with left foot ulceration with  non-invasive evidence of peripheral arterial disease. After careful discussion of risks, benefits, and alternatives the patient was offered angiography. The patient understood and wished to proceed.  OPERATIVE FINDINGS: Terminal aorta and iliac arteries: Poorly visualized, but widely patent. Small left common iliac aneurysm.  Left lower extremity: Common femoral artery: widely patent Profunda femoris artery: widely patent Superficial femoral artery: widely patent Popliteal artery: widely patent Anterior tibial artery: occluded Tibioperoneal trunk: widely patent Peroneal artery: widely patent to ankle. Arborization about the ankle. Posterior tibial artery: occluded Pedal circulation: small vessel disease about the foot and ankle.  GLASS score. N/A. Inline flow  to the ankle.  WIfI score. 1 / 3 / 1. Clinical stage 3.  DESCRIPTION OF PROCEDURE: After identification of the patient in the pre-operative holding area, the patient was transferred to the operating room. The patient was positioned supine on the operating room table. The groins was prepped and draped in standard fashion. A surgical pause was performed confirming correct patient, procedure, and operative location.  The right groin was anesthetized with subcutaneous injection of 1% lidocaine. Using ultrasound guidance, the right common femoral artery was accessed with micropuncture technique. Fluoroscopy was used to confirm cannulation over the femoral head. The 20F sheath was upsized to 70F.  A Benson wire was advanced into the distal aorta. Over the wire an omni flush catheter was advanced to the level of L2. Aortogram was performed - see above for details.  The left common iliac artery was selected with an omniflush catheter and glidewire guidewire. The wire was advanced into the common femoral artery. Over the wire the omni flush catheter was advanced into the external iliac artery. Selective angiography was performed - see above for details.  The sheath was  left in place to be removed in the recovery area.  Upon completion of the case instrument and sharps counts were confirmed correct. The patient was transferred to the PACU in good condition. I was present for all portions of the procedure.  PLAN: Small vessel disease in the foot. Patient optimized from a vascular standpoint. Recommend best medical therapy for peripheral arterial disease (ASA, statin, etc.).  Yevonne Aline. Stanford Breed, MD [facility] Phone Number: [phone] 04/24/2022 1:18 PM    VAS [place] ABI WITH/WO TBI  Result Date: 04/23/2022  LOWER EXTREMITY DOPPLER STUDY Patient Name:  JERRAN TAPPAN  Date of Exam:   04/23/2022 Medical Rec #: [id number]      Accession #:    [id number] Date of Birth: 1929/12/16      Patient Gender: M Patient Age:   23 years Exam Location:  [facility] Procedure:      VAS [place] ABI WITH/WO TBI Referring Phys: Jonas Goh --------------------------------------------------------------------------------  Indications: Rest pain. High Risk Factors: Hypertension, hyperlipidemia, past history of smoking.  Comparison Study: Previous exam dated 11/07/2018, Bilateral ABI                   non-compressible, Rt TBI 0.56, Lt TBI 0.67 Performing Technologist: Bobetta Lime BS, RVT  Examination Guidelines: A complete evaluation includes at minimum, Doppler waveform signals and systolic blood pressure reading at the level of bilateral brachial, anterior tibial, and posterior tibial arteries, when vessel segments are accessible. Bilateral testing is considered an integral part of a complete examination. Photoelectric Plethysmograph (PPG) waveforms and toe systolic pressure readings are included as required and additional duplex testing as needed. Limited examinations for reoccurring indications may be performed as noted.  ABI Findings: +---------+------------------+-----+-------------------+--------+ Right    Rt Pressure (mmHg)IndexWaveform            Comment  +---------+------------------+-----+-------------------+--------+ Brachial 99                     triphasic                   +---------+------------------+-----+-------------------+--------+ PTA      54                0.55 dampened monophasic         +---------+------------------+-----+-------------------+--------+ DP       255  2.58 monophasic                  +---------+------------------+-----+-------------------+--------+ Great Toe                       Absent                      +---------+------------------+-----+-------------------+--------+ +---------+------------------+-----+-------------------+---------------+ Left     Lt Pressure (mmHg)IndexWaveform           Comment         +---------+------------------+-----+-------------------+---------------+ Brachial                        triphasic          IV in upper arm +---------+------------------+-----+-------------------+---------------+ PTA      255               2.58 dampened monophasic                +---------+------------------+-----+-------------------+---------------+ DP       50                0.51 monophasic                         +---------+------------------+-----+-------------------+---------------+ Great Toe                       Absent                             +---------+------------------+-----+-------------------+---------------+ +-------+-----------+-----------+------------+------------+ ABI/TBIToday's ABIToday's TBIPrevious ABIPrevious TBI +-------+-----------+-----------+------------+------------+ Right  [place]         Absent                              +-------+-----------+-----------+------------+------------+ Left   [place]         Absent                              +-------+-----------+-----------+------------+------------+ Right TBIs appear decreased. Left TBIs appear decreased. Bilateral absent waveforms in the great toes, Bilateral ABI's  remain non-compressible.  Summary: Right: Resting right ankle-brachial index indicates noncompressible right lower extremity arteries. The right toe-brachial index is abnormal. Left: Resting left ankle-brachial index indicates noncompressible left lower extremity arteries. The left toe-brachial index is abnormal. *See table(s) above for measurements and observations.  Electronically signed by Monica Martinez MD on 04/23/2022 at 3:15:02 PM.    Final    CT FOOT LEFT WO CONTRAST  Result Date: 04/22/2022 CLINICAL DATA:  Left foot swelling. EXAM: CT OF THE LEFT FOOT WITHOUT CONTRAST TECHNIQUE: Multidetector CT imaging of the left foot was performed according to the standard protocol. Multiplanar CT image reconstructions were also generated. RADIATION DOSE REDUCTION: This exam was performed according to the departmental dose-optimization program which includes automated exposure control, adjustment of the mA and/or kV according to patient size and/or use of iterative reconstruction technique. COMPARISON:  Left foot x-rays dated October 18, 2018. FINDINGS: Bones/Joint/Cartilage No bony destruction or periosteal reaction. No fracture or dislocation. Minimal midfoot degenerative changes. No joint effusion. Osteopenia. Ligaments Ligaments are suboptimally evaluated by CT. Muscles and Tendons Grossly intact.  Complete atrophy of the intrinsic foot muscles. Soft tissue Diffuse soft tissue swelling, worse dorsally. No fluid collection or subcutaneous emphysema. No soft tissue mass. IMPRESSION:  1. Diffuse soft tissue swelling, worse dorsally, nonspecific, but can be seen in the setting of cellulitis. No abscess. 2. No acute osseous abnormality. Electronically Signed   By: Titus Dubin M.D.   On: 04/22/2022 08:05   VAS [place] LOWER EXTREMITY VENOUS (DVT) (ONLY [facility] & [facility])  Result Date: 04/19/2022  Lower Venous DVT Study Patient Name:  EL PILE  Date of Exam:   04/18/2022 Medical Rec #: [id number]      Accession #:     [id number] Date of Birth: 02/21/1930      Patient Gender: M Patient Age:   5 years Exam Location:  [facility] Procedure:      VAS [place] LOWER EXTREMITY VENOUS (DVT) Referring Phys: Herbie [place] PATERSON --------------------------------------------------------------------------------  Indications: Pain, Swelling, and Erythema.  Limitations: Patient unable to tolerate compressions secondary to pain. Comparison Study: No prior study on file Performing Technologist: Sharion Dove RVS  Examination Guidelines: A complete evaluation includes B-mode imaging, spectral Doppler, color Doppler, and power Doppler as needed of all accessible portions of each vessel. Bilateral testing is considered an integral part of a complete examination. Limited examinations for reoccurring indications may be performed as noted. The reflux portion of the exam is performed with the patient in reverse Trendelenburg.  +-----+---------------+---------+-----------+-------------------+--------------+ RIGHTCompressibilityPhasicitySpontaneityProperties         Thrombus Aging +-----+---------------+---------+-----------+-------------------+--------------+ CFV  Full                               Pulsatile waveforms               +-----+---------------+---------+-----------+-------------------+--------------+   +--------+---------------+---------+-----------+----------------+-------------+ LEFT    CompressibilityPhasicitySpontaneityProperties      Thrombus                                                                 Aging         +--------+---------------+---------+-----------+----------------+-------------+ CFV     Full                               Pulsatile                                                                waveforms                     +--------+---------------+---------+-----------+----------------+-------------+ SFJ     Full                                                              +--------+---------------+---------+-----------+----------------+-------------+ FV Prox Full                               Pulsatile  waveforms                     +--------+---------------+---------+-----------+----------------+-------------+ FV Mid                                     Pulsatile                                                                waveforms                     +--------+---------------+---------+-----------+----------------+-------------+ FV                                         Pulsatile                     Distal                                     waveforms                     +--------+---------------+---------+-----------+----------------+-------------+ PFV     Full                               Pulsatile                                                                waveforms                     +--------+---------------+---------+-----------+----------------+-------------+ POP                                        Pulsatile                                                                waveforms                     +--------+---------------+---------+-----------+----------------+-------------+ PTV     Full                                                             +--------+---------------+---------+-----------+----------------+-------------+ PERO    Full                                                             +--------+---------------+---------+-----------+----------------+-------------+  Summary: RIGHT: - No evidence of common femoral vein obstruction. Pulsatile waveforms  LEFT: - There is no evidence of deep vein thrombosis in the lower extremity.  - No cystic structure found in the popliteal fossa. Pulsatile waveforms.  *See table(s) above for measurements and observations. Electronically signed by Orlie Pollen on 04/19/2022  at 10:43:30 AM.    Final    US RENAL  Result Date: 04/19/2022 CLINICAL DATA:  Acute renal failure, stage IV chronic renal disease. EXAM: RENAL / URINARY TRACT ULTRASOUND COMPLETE COMPARISON:  None Available. FINDINGS: Right Kidney: Renal measurements: 9.6 cm x 4.4 cm x 4.5 cm = volume: 99.2 mL. Mild, diffusely increased echogenicity of the renal parenchyma is noted. No mass or hydronephrosis visualized. Left Kidney: Renal measurements: 11.0 cm x 5.5 cm x 5.0 cm = volume: 158.0 mL. Mild, diffusely increased echogenicity of the renal parenchyma is noted. No mass or hydronephrosis visualized. Bladder: Appears normal for degree of bladder distention. Other: None. IMPRESSION: Bilateral mildly echogenic kidneys which may be secondary to medical renal disease. Electronically Signed   By: Virgina Norfolk M.D.   On: 04/19/2022 02:01     Time coordinating discharge: Over 93 minutes    Steven Dee, MD  [facility] 04/27/2022, 12:09 PM

## 2022-04-27 NOTE — Progress Notes (Signed)
Physical Therapy Treatment Patient Details Name: Steven Williams MRN: [id number] DOB: 05/01/1930 Today's Date: 04/27/2022   History of Present Illness 88 yo male presents to ED On 8/12 with LLE pain and swelling due to cellulitis, negative for DVT. PMH includes AAA; BPH; COPD; HTN; HLD; pacemaker placement; hypothyroidism; MDS; afib on Coumadin; recurrent epistaxis; and RA presenting with epistaxis.    PT Comments    Pt received supine and agreeable to session with slow but steady progress towards acute goals. Pt needing up to min assist for bed mobility for LLE management secondary to pain. Pt able to come to standing with min assist to power up with increased time and tactile cues needed to elevate trunk as pt with very flexed torso and posterior lean. Pt able to maintain standing for increased time for LE therex for increased ROM strength. Educated pt re; benefits of continued mobility and activity recommendations. Current plan remains appropriate to address deficits and maximize functional independence and decrease caregiver burden. Pt continues to benefit from skilled PT services to progress toward functional mobility goals.    Recommendations for follow up therapy are one component of a multi-disciplinary discharge planning process, led by the attending physician.  Recommendations may be updated based on patient status, additional functional criteria and insurance authorization.  Follow Up Recommendations  Skilled nursing-short term rehab (<3 hours/day) Can patient physically be transported by private vehicle: Yes   Assistance Recommended at Discharge Frequent or constant Supervision/Assistance  Patient can return home with the following A lot of help with walking and/or transfers;A lot of help with bathing/dressing/bathroom   Equipment Recommendations  None recommended by PT    Recommendations for Other Services       Precautions / Restrictions Precautions Precautions:  Fall Precaution Comments: fecal and urinary incontinence Restrictions Weight Bearing Restrictions: No     Mobility  Bed Mobility Overal bed mobility: Needs Assistance Bed Mobility: Supine to Sit, Sit to Supine     Supine to sit: HOB elevated, Min guard Sit to supine: Min assist   General bed mobility comments: min gaurd to come to sitting, light min assist to bring LLE into bed    Transfers Overall transfer level: Needs assistance Equipment used: Rolling walker (2 wheels) Transfers: Sit to/from Stand, Bed to chair/wheelchair/BSC Sit to Stand: Min assist           General transfer comment: assist for power up, rise, steadying, keeping trunk upright. pt with posterior lean at start, increased time to find center and balance    Ambulation/Gait               General Gait Details: unable   Stairs             Wheelchair Mobility    Modified Rankin (Stroke Patients Only)       Balance Overall balance assessment: Needs assistance Sitting-balance support: No upper extremity supported, Feet supported Sitting balance-Leahy Scale: Fair     Standing balance support: Bilateral upper extremity supported, During functional activity, Reliant on assistive device for balance Standing balance-Leahy Scale: Poor Standing balance comment: min-mod physical assist for static and dynamic standing                            Cognition Arousal/Alertness: Awake/alert Behavior During Therapy: WFL for tasks assessed/performed Overall Cognitive Status: Within Functional Limits for tasks assessed  Exercises General Exercises - Lower Extremity Long Arc Quad: AROM, AAROM, Right, Left, 10 reps, Seated Hip ABduction/ADduction: AROM, Right, Left, 10 reps, Supine Hip Flexion/Marching: AROM, AAROM, Right, Left, 20 reps, Seated, Standing Mini-Sqauts: 10 reps, Standing    General Comments        Pertinent  Vitals/Pain Pain Assessment Pain Assessment: Faces Faces Pain Scale: Hurts little more Pain Location: foot-LLE Pain Descriptors / Indicators: Radiating, Spasm, Shooting, Tender Pain Intervention(s): Limited activity within patient's tolerance, Monitored during session    Home Living                          Prior Function            PT Goals (current goals can now be found in the care plan section) Acute Rehab PT Goals PT Goal Formulation: With patient Time For Goal Achievement: 05/05/22    Frequency    Min 3X/week      PT Plan      Co-evaluation              AM-PAC PT "6 Clicks" Mobility   Outcome Measure  Help needed turning from your back to your side while in a flat bed without using bedrails?: A Little Help needed moving from lying on your back to sitting on the side of a flat bed without using bedrails?: A Little Help needed moving to and from a bed to a chair (including a wheelchair)?: A Lot Help needed standing up from a chair using your arms (e.g., wheelchair or bedside chair)?: A Lot Help needed to walk in hospital room?: Total Help needed climbing 3-5 steps with a railing? : Total 6 Click Score: 12    End of Session Equipment Utilized During Treatment: Gait belt Activity Tolerance: Patient limited by fatigue Patient left: in chair;with call bell/phone within reach;with chair alarm set Nurse Communication: Mobility status PT Visit Diagnosis: Other abnormalities of gait and mobility (R26.89);Muscle weakness (generalized) (M62.81)     Time: 4709-6283 PT Time Calculation (min) (ACUTE ONLY): 30 min  Charges:  $Therapeutic Exercise: 8-22 mins $Therapeutic Activity: 8-22 mins                     Shellia Hartl R. PTA Acute Rehabilitation Services Office: [place] 04/27/2022, 1:14 PM

## 2022-04-27 NOTE — Progress Notes (Signed)
Report called to [facility], patient waiting on PTAR for transport

## 2022-04-27 NOTE — Plan of Care (Signed)
  Problem: Education: Goal: Knowledge of General Education information will improve Description: Including pain rating scale, medication(s)/side effects and non-pharmacologic comfort measures Outcome: Adequate for Discharge   Problem: Health Behavior/Discharge Planning: Goal: Ability to manage health-related needs will improve Outcome: Adequate for Discharge   Problem: Clinical Measurements: Goal: Ability to maintain clinical measurements within normal limits will improve Outcome: Adequate for Discharge Goal: Will remain free from infection Outcome: Adequate for Discharge Goal: Diagnostic test results will improve Outcome: Adequate for Discharge Goal: Respiratory complications will improve Outcome: Adequate for Discharge Goal: Cardiovascular complication will be avoided Outcome: Adequate for Discharge   Problem: Nutrition: Goal: Adequate nutrition will be maintained Outcome: Adequate for Discharge   Problem: Coping: Goal: Level of anxiety will decrease Outcome: Adequate for Discharge   Problem: Elimination: Goal: Will not experience complications related to bowel motility Outcome: Adequate for Discharge Goal: Will not experience complications related to urinary retention Outcome: Adequate for Discharge   Problem: Pain Managment: Goal: General experience of comfort will improve Outcome: Adequate for Discharge   Problem: Safety: Goal: Ability to remain free from injury will improve Outcome: Adequate for Discharge   Problem: Activity: Goal: Ability to return to baseline activity level will improve Outcome: Adequate for Discharge   Problem: Education: Goal: Understanding of CV disease, CV risk reduction, and recovery process will improve Outcome: Adequate for Discharge Goal: Individualized Educational Video(s) Outcome: Adequate for Discharge

## 2022-04-27 NOTE — Care Management Important Message (Signed)
Important Message  Patient Details  Name: Steven Williams MRN: [id number] Date of Birth: [birth year]   Medicare Important Message Given:  Yes     Shelda Altes 04/27/2022, 8:18 AM

## 2022-04-27 NOTE — TOC Transition Note (Signed)
Transition of Care [facility]) - CM/SW Discharge Note   Patient Details  Name: Steven Williams MRN: [id number] Date of Birth: [birth year]  [facility] CM/SW Contact:  Vinie Sill, LCSW Phone Number: 04/27/2022, 3:06 PM   Clinical Narrative:     Patient will Discharge to: [facility] Discharge Date: 04/27/2022 Family Notified: son & daughter Transport By: Corey Harold  Per MD patient is ready for discharge. RN, patient, and facility notified of discharge. Discharge Summary sent to facility. RN given number for report([phone], Rm [phone]). Ambulance transport requested for patient.   Clinical Social Worker signing off.  Thurmond Butts, MSW, LCSW Clinical Social Worker    Final next level of care: Skilled Nursing Facility Barriers to Discharge: Barriers Resolved   Patient Goals and CMS Choice Patient states their goals for this hospitalization and ongoing recovery are:: To return to [place] CMS Medicare.gov Compare Post Acute Care list provided to:: Patient Choice offered to / list presented to : Patient  Discharge Placement PASRR number recieved:  (04/27/2022)            Patient [facility] bed at:  [facility]) Patient to be transferred to facility by: [place] Name of family member notified: son Patient and family notified of of transfer: 04/27/22  Discharge Plan and Services In-house Referral: Clinical Social Work Discharge Planning Services: CM Consult                                 Social Determinants of Health ([place]) Interventions     Readmission Risk Interventions    04/21/2022    9:52 AM  Readmission Risk Prevention Plan  Transportation Screening Complete  Medication Review Press photographer) Complete  PCP or Specialist appointment within 3-5 days of discharge Complete  HRI or [place] Complete  SW Recovery Care/Counseling Consult Complete  Palliative Care Screening Complete  [place] Not Applicable

## 2022-04-28 ENCOUNTER — Telehealth: Payer: Self-pay | Admitting: Vascular Surgery

## 2022-04-28 NOTE — Telephone Encounter (Signed)
Appointment scheduled.

## 2022-04-28 NOTE — Telephone Encounter (Signed)
-----   Message from Cherre Robins, MD sent at 04/25/2022  4:21 PM EDT ----- Follow up with [facility] PA in 1 month with Abi. Thank you. Gershon Mussel

## 2022-05-01 ENCOUNTER — Other Ambulatory Visit: Payer: Self-pay

## 2022-05-01 ENCOUNTER — Emergency Department (HOSPITAL_COMMUNITY): Payer: Medicare Other

## 2022-05-01 ENCOUNTER — Encounter (HOSPITAL_COMMUNITY): Payer: Self-pay

## 2022-05-01 ENCOUNTER — Emergency Department (HOSPITAL_COMMUNITY)
Admission: EM | Admit: 2022-05-01 | Discharge: 2022-05-01 | Disposition: A | Discharge status: Home / Self Care | Payer: Medicare Other | Attending: Emergency Medicine | Admitting: Emergency Medicine

## 2022-05-01 DIAGNOSIS — Z7982 Long term (current) use of aspirin: Secondary | ICD-10-CM | POA: Insufficient documentation

## 2022-05-01 DIAGNOSIS — J90 Pleural effusion, not elsewhere classified: Secondary | ICD-10-CM | POA: Diagnosis not present

## 2022-05-01 DIAGNOSIS — N184 Chronic kidney disease, stage 4 (severe): Secondary | ICD-10-CM | POA: Insufficient documentation

## 2022-05-01 DIAGNOSIS — R0602 Shortness of breath: Secondary | ICD-10-CM | POA: Diagnosis present

## 2022-05-01 LAB — URINALYSIS, ROUTINE W REFLEX MICROSCOPIC
Bacteria, UA: NONE SEEN
Bilirubin Urine: NEGATIVE
Glucose, UA: NEGATIVE mg/dL
Hgb urine dipstick: NEGATIVE
Ketones, ur: NEGATIVE mg/dL
Nitrite: NEGATIVE
Protein, ur: NEGATIVE mg/dL
Specific Gravity, Urine: 1.015 (ref 1.005–1.030)
pH: 5 (ref 5.0–8.0)

## 2022-05-01 LAB — CBC WITH DIFFERENTIAL/PLATELET
Abs Immature Granulocytes: 0.16 10*3/uL — ABNORMAL HIGH (ref 0.00–0.07)
Basophils Absolute: 0 10*3/uL (ref 0.0–0.1)
Basophils Relative: 0 %
Eosinophils Absolute: 0.2 10*3/uL (ref 0.0–0.5)
Eosinophils Relative: 2 %
HCT: 27.2 % — ABNORMAL LOW (ref 39.0–52.0)
Hemoglobin: 8.6 g/dL — ABNORMAL LOW (ref 13.0–17.0)
Immature Granulocytes: 2 %
Lymphocytes Relative: 9 %
Lymphs Abs: 0.7 10*3/uL (ref 0.7–4.0)
MCH: 33.2 pg (ref 26.0–34.0)
MCHC: 31.6 g/dL (ref 30.0–36.0)
MCV: 105 fL — ABNORMAL HIGH (ref 80.0–100.0)
Monocytes Absolute: 0.6 10*3/uL (ref 0.1–1.0)
Monocytes Relative: 9 %
Neutro Abs: 5.4 10*3/uL (ref 1.7–7.7)
Neutrophils Relative %: 78 %
Platelets: 172 10*3/uL (ref 150–400)
RBC: 2.59 MIL/uL — ABNORMAL LOW (ref 4.22–5.81)
RDW: 14.8 % (ref 11.5–15.5)
WBC: 7.1 10*3/uL (ref 4.0–10.5)
nRBC: 0 % (ref 0.0–0.2)

## 2022-05-01 LAB — COMPREHENSIVE METABOLIC PANEL
ALT: 17 U/L (ref 0–44)
AST: 20 U/L (ref 15–41)
Albumin: 3 g/dL — ABNORMAL LOW (ref 3.5–5.0)
Alkaline Phosphatase: 199 U/L — ABNORMAL HIGH (ref 38–126)
Anion gap: 8 (ref 5–15)
BUN: 53 mg/dL — ABNORMAL HIGH (ref 8–23)
CO2: 20 mmol/L — ABNORMAL LOW (ref 22–32)
Calcium: 9.3 mg/dL (ref 8.9–10.3)
Chloride: 112 mmol/L — ABNORMAL HIGH (ref 98–111)
Creatinine, Ser: 2.12 mg/dL — ABNORMAL HIGH (ref 0.61–1.24)
GFR, Estimated: 29 mL/min — ABNORMAL LOW (ref >60)
Glucose, Bld: 120 mg/dL — ABNORMAL HIGH (ref 70–99)
Potassium: 4.3 mmol/L (ref 3.5–5.1)
Sodium: 140 mmol/L (ref 135–145)
Total Bilirubin: 0.6 mg/dL (ref 0.3–1.2)
Total Protein: 6.1 g/dL — ABNORMAL LOW (ref 6.5–8.1)

## 2022-05-01 LAB — PROTIME-INR
INR: 1.2 (ref 0.8–1.2)
Prothrombin Time: 14.8 seconds (ref 11.4–15.2)

## 2022-05-01 LAB — APTT: aPTT: 39 seconds — ABNORMAL HIGH (ref 24–36)

## 2022-05-01 LAB — LIPASE, BLOOD: Lipase: 59 U/L — ABNORMAL HIGH (ref 11–51)

## 2022-05-01 LAB — LACTIC ACID, PLASMA: Lactic Acid, Venous: 1.3 mmol/L (ref 0.5–1.9)

## 2022-05-01 MED ORDER — FUROSEMIDE 20 MG PO TABS: 20.0000 mg | ORAL_TABLET | Freq: Every day | ORAL | 0 refills | Status: DC

## 2022-05-01 MED ORDER — FUROSEMIDE 20 MG PO TABS
20.0000 mg | ORAL_TABLET | Freq: Once | ORAL | Status: AC
  Administered 2022-05-01: 20 mg via ORAL
  Filled 2022-05-01: qty 1

## 2022-05-01 NOTE — ED Notes (Signed)
Patient transported to X-ray 

## 2022-05-01 NOTE — ED Provider Triage Note (Signed)
Emergency Medicine Provider Triage Evaluation Note  Steven Williams , a 88 y.o. male  was evaluated in triage.  Pt complains of bruising/bleeding.  Patient is from [name] brought in by EMS.  Patient denies any pain, recent falls.  No anticoagulants at this time.  Denies abdominal pain, chest pain, shortness of breath.  Review of Systems  Positive: As per HPI Negative:   Physical Exam  BP (!) 112/52 (BP Location: Right Arm)   Pulse 95   Temp 97.8 F (36.6 C) (Oral)   Resp 16   SpO2 100%  Gen:   Awake, no distress Resp:  Normal effort  MSK:   Moves extremities without difficulty  Other:  Bruising noted to lower abdomen, more so to right lower quadrant radiating to the right lateral aspect of abdomen and to right lumbar musculature region.  No tenderness to palpation noted to the abdomen where bruising area.  Medical Decision Making  Medically screening exam initiated at 1:05 PM.  Appropriate orders placed.  [name] was informed that the remainder of the evaluation will be completed by another provider, this initial triage assessment does not replace that evaluation, and the importance of remaining in the ED until their evaluation is complete.  Work-up initiated   [name], PA-C 05/01/22 1316

## 2022-05-01 NOTE — Discharge Instructions (Addendum)
Steven Williams, Thank you for allowing me to take part in your care today.  Here are your instructions.  1.  Given your shortness of breath, this is likely due to some fluid on your lungs.  I will give you a fluid pill, to get rid of the fluid.  This should help your shortness of breath improved.  Please take this pill 1 time a day for the next 4 days.  2.  Given your bruising, this likely is from your Lovenox shots during your hospital stay.  This is improving.  There is no concern about this currently.  3.  If you develop worsening shortness of breath, or develop chest pain upon exertion, please return back to the emergency department to be evaluated.  Thank you, Dr. Posey Pronto

## 2022-05-01 NOTE — ED Triage Notes (Signed)
Pt BIB [facility] from [facility] c/o bruising across his lower abdomen. Pt denies any pain. Pt denies any falls or trauma that could have caused the bruising. Pt is not on blood thinners. Pt is there for cellulitis of BLE.

## 2022-05-01 NOTE — ED Provider Notes (Signed)
[facility] EMERGENCY DEPARTMENT Provider Note   CSN: [id number] Arrival date & time: 05/01/22  1259     History  Chief Complaint  Patient presents with   Bleeding/Bruising    Natanel Snavely is a 88 y.o. male with a past medical history of A-fib, thrombocytopenia, CKD stage IV, active cellulitis, presenting to the emergency room after [facility] noticed that he had some bruising to his abdomen.  The patient is at [facility] for rehab after being hospitalized for cellulitis.  Patient reports that he notices bruising 2 weeks ago, when he was receiving Lovenox shots in the hospital.  Patient reports that the bruising has gotten worse.  The [facility] staff noticed it today, and thought he should be evaluated for this.  Patient denies any trauma.  He denies any falls.  He reports that he is feeling fine.  He does report some shortness of breath at times, but this is not new.  He denies any chest pain.  Patient states no other concerns at this time.  HPI     Home Medications Prior to Admission medications   Medication Sig Start Date End Date Taking? Authorizing Provider  furosemide (LASIX) 20 MG tablet Take 1 tablet (20 mg total) by mouth daily for 4 days. 05/01/22 05/05/22 Yes Blanchie Dessert, MD  acetaminophen (TYLENOL) 500 MG tablet Take 2 tablets (1,000 mg total) by mouth every 6 (six) hours as needed. 04/27/22   Dwyane Dee, MD  allopurinol (ZYLOPRIM) 100 MG tablet Take 0.5 tablets (50 mg total) by mouth every other day. 04/27/22   Dwyane Dee, MD  aspirin EC 81 MG tablet Take 1 tablet (81 mg total) by mouth daily. Swallow whole. 04/28/22   Dwyane Dee, MD  atorvastatin (LIPITOR) 40 MG tablet Take 1 tablet (40 mg total) by mouth daily. 04/28/22   Dwyane Dee, MD  carvedilol (COREG) 6.25 MG tablet Take 6.25 mg by mouth 2 (two) times daily with a meal.    [provider]  Cholecalciferol (VITAMIN D3) 50 MCG (2000 UT) TABS Take 2,000 Units by mouth  daily.    [provider]  digoxin (LANOXIN) 0.125 MG tablet Take 0.125 mg by mouth every Monday, Wednesday, and Friday.    [provider]  Ferrous Sulfate (IRON) 325 (65 Fe) MG TABS Take 1 tablet (325 mg total) by mouth daily with breakfast. 04/27/22   Dwyane Dee, MD  gabapentin (NEURONTIN) 300 MG capsule Take 1 capsule (300 mg total) by mouth 2 (two) times daily. 04/27/22   Dwyane Dee, MD  hydrocortisone (ANUSOL-HC) 2.5 % rectal cream Apply 1 application topically 2 (two) times daily as needed for hemorrhoids or anal itching. 04/15/21   [provider]  levothyroxine (SYNTHROID) 100 MCG tablet Take 100 mcg by mouth daily before breakfast. 06/04/20   [provider]  Multiple Vitamins-Minerals (PRESERVISION AREDS PO) Take 1 capsule by mouth 2 (two) times daily.    [provider]  pantoprazole (PROTONIX) 40 MG tablet Take 40 mg by mouth daily before breakfast. 06/04/20   [provider]  Polyvinyl Alcohol-Povidone (REFRESH OP) Place 1 drop into both eyes 4 (four) times daily.    [provider]  rOPINIRole (REQUIP) 1 MG tablet Take 3 tablets (3 mg total) by mouth at bedtime. 04/27/22   Dwyane Dee, MD  saw palmetto 500 MG capsule Take 500 mg by mouth daily.    [provider]  senna-docusate (SENOKOT-S) 8.6-50 MG tablet Take 1 tablet by mouth 2 (two)  times daily. 04/27/22   Dwyane Dee, MD  sodium chloride (OCEAN) 0.65 % SOLN nasal spray Place 2 sprays into both nostrils every 4 (four) hours while awake. 01/11/22 04/18/22  Darliss Cheney, MD  tamsulosin (FLOMAX) 0.4 MG CAPS capsule Take 0.4 mg by mouth daily.    [provider]  traMADol (ULTRAM) 50 MG tablet Take 1 tablet (50 mg total) by mouth every 8 (eight) hours as needed for moderate pain. 04/27/22   Dwyane Dee, MD  vitamin B-12 (CYANOCOBALAMIN) 1000 MCG tablet Take 1 tablet (1,000 mcg total) by mouth daily. 07/09/21   Orson Slick, MD      Allergies     Iodinated contrast media    Review of Systems   Review of Systems  Constitutional:  Positive for chills. Negative for fever.  Respiratory:  Positive for shortness of breath.   Cardiovascular:  Negative for chest pain and leg swelling.  Gastrointestinal:  Negative for abdominal pain, diarrhea and vomiting.  Hematological:        Patient endorses bruising.    Physical Exam Updated Vital Signs BP 115/66   Pulse 69   Temp (!) 97.3 F (36.3 C) (Oral)   Resp 14   SpO2 100%  Physical Exam  General: Patient resting in bed comfortably Eyes: Noninjected conjunctiva Head: Normocephalic, atraumatic  Neck: Supple, nontender, full range of motion Cardio: irregularly irregular rhythm noted on exam Pulmonary: Right sided decreased breath sounds noted at lung base. Abdomen: Soft, with some protruding noted.  No tenderness. Back: No bruising noted to the back.  No tenderness to the back. Skin: Bruising noted to anterior abdomen and right flank region.  Looks like it is healing well.  No tracking is noted. MSK: 5/5 strength to upper and lower extremities.  ED Results / Procedures / Treatments   Labs (all labs ordered are listed, but only abnormal results are displayed) Labs Reviewed  CBC WITH DIFFERENTIAL/PLATELET - Abnormal; Notable for the following components:      Result Value   RBC 2.59 (*)    Hemoglobin 8.6 (*)    HCT 27.2 (*)    MCV 105.0 (*)    Abs Immature Granulocytes 0.16 (*)    All other components within normal limits  COMPREHENSIVE METABOLIC PANEL - Abnormal; Notable for the following components:   Chloride 112 (*)    CO2 20 (*)    Glucose, Bld 120 (*)    BUN 53 (*)    Creatinine, Ser 2.12 (*)    Total Protein 6.1 (*)    Albumin 3.0 (*)    Alkaline Phosphatase 199 (*)    GFR, Estimated 29 (*)    All other components within normal limits  LIPASE, BLOOD - Abnormal; Notable for the following components:   Lipase 59 (*)    All other components within normal limits   URINALYSIS, ROUTINE W REFLEX MICROSCOPIC - Abnormal; Notable for the following components:   Leukocytes,Ua TRACE (*)    All other components within normal limits  APTT - Abnormal; Notable for the following components:   aPTT 39 (*)    All other components within normal limits  PROTIME-INR  LACTIC ACID, PLASMA    EKG None  Radiology DG Chest 2 View  Result Date: 05/01/2022 CLINICAL DATA:  Shortness of breath. EXAM: CHEST - 2 VIEW COMPARISON:  CT chest without contrast 11/24/2021 FINDINGS: The heart is enlarged. Atherosclerotic changes are present at the aorta. Bilateral pleural effusions are present, right greater than left. Bibasilar  airspace disease likely reflects atelectasis. The upper lungs are clear. Pacing wires are stable. Visualized soft tissues and bony thorax are otherwise unremarkable. IMPRESSION: 1. Cardiomegaly without failure. 2. Bilateral pleural effusions, right greater than left. 3. Bibasilar airspace disease likely reflects atelectasis. Infection is not excluded. Electronically Signed   By: San Morelle M.D.   On: 05/01/2022 20:03    Procedures Procedures    Medications Ordered in ED Medications  furosemide (LASIX) tablet 20 mg (has no administration in time range)    ED Course/ Medical Decision Making/ A&P                           Medical Decision Making This is a 88 year old male with a past medical history of A-fib, thrombocytopenia, CKD stage IV presenting with concerns for abdominal bruising.  Patient also reports having intermittent shortness of breath for the last 3 weeks.  Patient was recently discharged from the hospital for cellulitis and has been going to [facility] for rehab.  He reports that he noticed this bruising 2 weeks ago, while he was getting Lovenox injections.  He states that the bruising continued to get worse.  He states that it went to his right flank.  Today, the staff at [facility] noticed this, and thought he should be evaluated.   Patient has historically had low platelets.  He is not on any blood thinners.  On exam, I do appreciate some bruising noted to his anterior abdomen and right flank.  This looks like it is healing. Also during exam he notes that he was having some shortness of breath during talking to me. Initial CBC showing platelets of 172.  With hemoglobin of 8.6.  This is close to baseline for him.  Chest x-ray showing bilateral pleural effusions. CMP unremarkable at this time.  Lipase slightly elevated at 59.  Lactate negative.  UA negative.  Amount and/or Complexity of Data Reviewed Radiology: ordered.  Risk Prescription drug management.   2107: Given bilateral pleural effusions, will prescribe patient diuretic to take home.  We will give patient Lasix 20 mg daily for the next 3 days.  Patient's vital signs have been stable during his stay here at the department, patient is stable for discharge.  Patient will be discharged.  This bruising, likely is from his Lovenox, it is improving, and is of no concern at this time.  Given his shortness of breath, this is likely from his pleural effusions, which we will treat with Lasix 20 mg daily outpatient for the next 4 days.  Patient remains with stable oxygen sats in the department.  No concern for hypoxia.  Patient will be discharged with Lasix 20 mg daily for the next 4 days.  Patient to be discharged after getting oral Lasix.         Final Clinical Impression(s) / ED Diagnoses Final diagnoses:  Pleural effusion    Rx / DC Orders ED Discharge Orders          Ordered    furosemide (LASIX) 20 MG tablet  Daily        05/01/22 2134              Leigh Aurora, DO 05/01/22 2136    Blanchie Dessert, MD 05/03/22 2320

## 2022-05-01 NOTE — ED Notes (Signed)
PTAR CALLED 4th on the list

## 2022-05-04 ENCOUNTER — Encounter: Payer: Self-pay | Admitting: Diagnostic Neuroimaging

## 2022-05-04 ENCOUNTER — Ambulatory Visit (INDEPENDENT_AMBULATORY_CARE_PROVIDER_SITE_OTHER): Payer: Medicare Other | Admitting: Diagnostic Neuroimaging

## 2022-05-04 VITALS — BP 82/44 | HR 54 | Ht 74.0 in

## 2022-05-04 DIAGNOSIS — G629 Polyneuropathy, unspecified: Secondary | ICD-10-CM

## 2022-05-04 DIAGNOSIS — G2581 Restless legs syndrome: Secondary | ICD-10-CM | POA: Diagnosis not present

## 2022-05-04 NOTE — Patient Instructions (Signed)
  NEUROPATHY / RLS (? Idiopathic / age related; CKD; also peripheral arterial disease)  - continue gabapentin '300mg'$  twice a day  - continue ropinirole '3mg'$  at bedtime (neupro patch ordered last visit, but not covered by insurance)  - continue oxycodone as needed for cellulitis pain; may need long term in palliative approach  - other medical options limited due to chronic kidney disease, age, gait, anti-coagulation

## 2022-05-04 NOTE — Progress Notes (Signed)
[facility]  PATIENT: Steven Williams DOB: [birth year]  REFERRING CLINICIAN: Ginger Organ., MD HISTORY FROM: patient  REASON FOR VISIT: follow up   HISTORICAL  CHIEF COMPLAINT:  Chief Complaint  Patient presents with   Follow-up    He states his restless leg syndrome getting worse. He reports he has cellulitis after coming from hospital. He states the hospital detected poor circulation in his legs. Room 6 with daughter     HISTORY OF PRESENT ILLNESS:   UPDATE (05/04/22, VRP): Since last visit, recently in hospital for cellulitis; meds adjusted. Now on oxycodone for cellulitis pain, which is helping.   UPDATE (12/01/21, VRP): Since last visit, doing about the same, but sometimes RLS are worse and sleep is worse.  UPDATE (06/02/21, VRP): Since last visit, doing worse with RLS. Sxs better in last few days. Worse in the evening. On lyrica and ropinirole.   PRIOR HPI (03/13/20): 88 year old male here for evaluation of neuropathy and restless leg syndrome.  For past 5 years patient has had numbness and tingling in his toes and feet, progressively increasing up to his shins.  He also has history of low back surgeries x2.  Patient was diagnosed with neuropathy, but no specific cause was found.  He has been started on gabapentin, switched to Lyrica with mild relief.  Also was developing restless leg symptoms at nighttime and starting ropinirole which seems to have helped.  No numbness or tingling in fingers or hands.    REVIEW OF SYSTEMS: Full 14 system review of systems performed and negative with exception of: As per HPI.  ALLERGIES: Allergies  Allergen Reactions   Iodinated Contrast Media Rash    HOME MEDICATIONS: Outpatient Medications Prior to Visit  Medication Sig Dispense Refill   acetaminophen (TYLENOL) 500 MG tablet Take 2 tablets (1,000 mg total) by mouth every 6 (six) hours as needed. 30 tablet 0   allopurinol (ZYLOPRIM) 100 MG tablet Take 0.5  tablets (50 mg total) by mouth every other day.     aspirin EC 81 MG tablet Take 1 tablet (81 mg total) by mouth daily. Swallow whole. 30 tablet 12   atorvastatin (LIPITOR) 40 MG tablet Take 1 tablet (40 mg total) by mouth daily.     carvedilol (COREG) 6.25 MG tablet Take 6.25 mg by mouth 2 (two) times daily with a meal.     cephALEXin (KEFLEX) 500 MG capsule Take 500 mg by mouth 4 (four) times daily.     Cholecalciferol (VITAMIN D3) 50 MCG (2000 UT) TABS Take 2,000 Units by mouth daily.     digoxin (LANOXIN) 0.125 MG tablet Take 0.125 mg by mouth every Monday, Wednesday, and Friday.     Ferrous Sulfate (IRON) 325 (65 Fe) MG TABS Take 1 tablet (325 mg total) by mouth daily with breakfast. 30 tablet 0   furosemide (LASIX) 20 MG tablet Take 1 tablet (20 mg total) by mouth daily for 4 days. 4 tablet 0   gabapentin (NEURONTIN) 300 MG capsule Take 1 capsule (300 mg total) by mouth 2 (two) times daily.     hydrocortisone (ANUSOL-HC) 2.5 % rectal cream Apply 1 application topically 2 (two) times daily as needed for hemorrhoids or anal itching.     levothyroxine (SYNTHROID) 100 MCG tablet Take 100 mcg by mouth daily before breakfast.     Multiple Vitamins-Minerals (PRESERVISION AREDS PO) Take 1 capsule by mouth 2 (two) times daily.     oxyCODONE-acetaminophen (PERCOCET/ROXICET) 5-325 MG tablet Take by mouth every  4 (four) hours as needed for severe pain.     pantoprazole (PROTONIX) 40 MG tablet Take 40 mg by mouth daily before breakfast.     Polyvinyl Alcohol-Povidone (REFRESH OP) Place 1 drop into both eyes 4 (four) times daily.     rOPINIRole (REQUIP) 1 MG tablet Take 3 tablets (3 mg total) by mouth at bedtime. 60 tablet 5   saw palmetto 500 MG capsule Take 500 mg by mouth daily.     senna-docusate (SENOKOT-S) 8.6-50 MG tablet Take 1 tablet by mouth 2 (two) times daily.     tamsulosin (FLOMAX) 0.4 MG CAPS capsule Take 0.4 mg by mouth daily.     traMADol (ULTRAM) 50 MG tablet Take 1 tablet (50 mg total)  by mouth every 8 (eight) hours as needed for moderate pain. 15 tablet 0   vitamin B-12 (CYANOCOBALAMIN) 1000 MCG tablet Take 1 tablet (1,000 mcg total) by mouth daily. 30 tablet 3   sodium chloride (OCEAN) 0.65 % SOLN nasal spray Place 2 sprays into both nostrils every 4 (four) hours while awake. 15 mL 0   Facility-Administered Medications Prior to Visit  Medication Dose Route Frequency Provider Last Rate Last Admin   Bevacizumab (AVASTIN) SOLN 1.25 mg  1.25 mg Intravitreal  [name], MD   1.25 mg at 11/01/18 2343    PAST MEDICAL HISTORY: Past Medical History:  Diagnosis Date   Antiphospholipid antibody positive    Aortic aneurysm [place])    thoracic, Dr Roxan Hockey, with h/o dissection   BPH (benign prostatic hyperplasia)    Central retinal artery occlusion    Chronic anemia    Chronic renal insufficiency    COPD (chronic obstructive pulmonary disease) (HCC)    DDD (degenerative disc disease), cervical    DJD (degenerative joint disease)    DVT (deep venous thrombosis) (HCC)    Gastric dysmotility    GERD (gastroesophageal reflux disease)    Gout    HTN (hypertension)    Hyperlipidemia    Hypertensive retinopathy    OU   Hypothyroid    Macular degeneration    Myelodysplasia (myelodysplastic syndrome) (HCC)    Paget's disease of bone    Peripheral neuropathy    Permanent atrial fibrillation (HCC)    Recurrent epistaxis 06/26/2020   Rheumatoid arthritis (HCC)    TIA (transient ischemic attack)     PAST SURGICAL HISTORY: Past Surgical History:  Procedure Laterality Date   ABDOMINAL AORTOGRAM W/LOWER EXTREMITY Left 04/24/2022   Procedure: ABDOMINAL AORTOGRAM W/LOWER EXTREMITY;  Surgeon: Cherre Robins, MD;  Location: [facility];  Service: Cardiovascular;  Laterality: Left;   ATRIAL FIBRILLATION ABLATION  09/23/2014   Dr Encarnacion Chu in [place]     lumbar, x 2   CAROTID ENDARTERECTOMY Right 1990   CAROTID STENT     CATARACT EXTRACTION Bilateral 2010    [facility]   EYE SURGERY Bilateral 2010   [facility]   LUNG DECORTICATION     VATS   PACEMAKER GENERATOR CHANGE  12/03/2011   MDT [facility] ADDR01 generator change by Dr Encarnacion Chu for symptomatic bradycardia   PACEMAKER IMPLANT  2004   MDT     FAMILY HISTORY: Family History  Problem Relation Age of Onset   Cirrhosis Mother    Stroke Father    Lung cancer Sister    Lung cancer Brother     SOCIAL HISTORY: Social History   Socioeconomic History   Marital status: Widowed    Spouse name: Not on  file   Number of children: 3   Years of education: Not on file   Highest education level: Bachelor's degree (e.g., BA, AB, BS)  Occupational History   Occupation: retired    Comment: retired [employer]  Tobacco Use   Smoking status: Former    Packs/day: 2.00    Years: 12.00    Total pack years: 24.00    Types: Cigarettes    Quit date: 09/07/1962    Years since quitting: 59.6   Smokeless tobacco: Never  Vaping Use   Vaping Use: Never used  Substance and Sexual Activity   Alcohol use: Yes    Alcohol/week: 2.0 standard drinks of alcohol    Types: 2 Glasses of wine per week    Comment: daily   Drug use: Never   Sexual activity: Not Currently  Other Topics Concern   Not on file  Social History Narrative   Previously lived in [place]   03/12/20 Now lives in [place]   Retired Chief Financial Officer   Caffeine 2 a day   Social Determinants of Radio broadcast assistant Strain: Not on file  Food Insecurity: Not on file  Transportation Needs: Not on file  Physical Activity: Not on file  Stress: Not on file  Social Connections: Not on file  Intimate Partner Violence: Not on file     PHYSICAL EXAM  GENERAL EXAM/CONSTITUTIONAL: Vitals:  Vitals:   05/04/22 1516  BP: (!) 82/44  Pulse: (!) 54  Height: '6\' 2"'$  (1.88 m)   Body mass index is 25.05 kg/m. Wt Readings from Last 3 Encounters:  04/27/22 195 lb 1.7 oz (88.5 kg)  03/25/22 184 lb (83.5 kg)  01/09/22 180 lb 1.9 oz (81.7  kg)   Patient is in no distress; well developed, nourished and groomed; neck is supple  CARDIOVASCULAR: Examination of carotid arteries is normal; no carotid bruits Regular rate and rhythm, no murmurs Examination of peripheral vascular system by observation and palpation is normal  EYES: Ophthalmoscopic exam of optic discs and posterior segments is normal; no papilledema or hemorrhages No results found.  MUSCULOSKELETAL: Gait, strength, tone, movements noted in Neurologic exam below  NEUROLOGIC: MENTAL STATUS:      No data to display         awake, alert, oriented to person, place and time recent and remote memory intact normal attention and concentration language fluent, comprehension intact, naming intact fund of knowledge appropriate  CRANIAL NERVE:  2nd - no papilledema on fundoscopic exam 2nd, 3rd, 4th, 6th - pupils equal and reactive to light, visual fields full to confrontation, extraocular muscles intact, no nystagmus 5th - facial sensation symmetric 7th - facial strength symmetric 8th - hearing intact 9th - palate elevates symmetrically, uvula midline 11th - shoulder shrug symmetric 12th - tongue protrusion midline  MOTOR:  normal bulk and tone, full strength in the BUE, BLE LEFT HAND AND LEFT LEG SWELLING  SENSORY:  normal and symmetric to light touch; left foot wrapped  COORDINATION:  finger-nose-finger, fine finger movements normal  REFLEXES:  deep tendon reflexes TRACE and symmetric  GAIT/STATION:  WHEELCHAIR     DIAGNOSTIC DATA (LABS, IMAGING, TESTING) - I reviewed patient records, labs, notes, testing and imaging myself where available.  Lab Results  Component Value Date   WBC 7.1 05/01/2022   HGB 8.6 (L) 05/01/2022   HCT 27.2 (L) 05/01/2022   MCV 105.0 (H) 05/01/2022   PLT 172 05/01/2022      Component Value Date/Time   NA  140 05/01/2022 1330   NA 140 03/25/2022 1240   K 4.3 05/01/2022 1330   CL 112 (H) 05/01/2022 1330   CO2  20 (L) 05/01/2022 1330   GLUCOSE 120 (H) 05/01/2022 1330   BUN 53 (H) 05/01/2022 1330   BUN 48 (H) 03/25/2022 1240   CREATININE 2.12 (H) 05/01/2022 1330   CREATININE 2.71 (H) 11/10/2021 1342   CALCIUM 9.3 05/01/2022 1330   PROT 6.1 (L) 05/01/2022 1330   ALBUMIN 3.0 (L) 05/01/2022 1330   AST 20 05/01/2022 1330   AST 17 11/10/2021 1342   ALT 17 05/01/2022 1330   ALT 12 11/10/2021 1342   ALKPHOS 199 (H) 05/01/2022 1330   BILITOT 0.6 05/01/2022 1330   BILITOT 0.5 11/10/2021 1342   GFRNONAA 29 (L) 05/01/2022 1330   GFRNONAA 22 (L) 11/10/2021 1342   GFRAA 35 (L) 10/24/2020 1141   Lab Results  Component Value Date   CHOL 89 04/25/2022   HDL 26 (L) 04/25/2022   LDLCALC 51 04/25/2022   TRIG 58 04/25/2022   CHOLHDL 3.4 04/25/2022   No results found for: "HGBA1C" Lab Results  Component Value Date   VITAMINB12 992 (H) 11/10/2021   Lab Results  Component Value Date   TSH 3.876 06/23/2021    2021 - ferritin 220 - iron 66 - B12 645 - TSH 7.07    ASSESSMENT AND PLAN  88 y.o. year old male here with:  Dx:  1. Restless leg syndrome   2. Neuropathy       PLAN:  NEUROPATHY / RLS (? Idiopathic / age related; CKD; also peripheral arterial disease)  - continue gabapentin '300mg'$  twice a day  - continue ropinirole '3mg'$  at bedtime (neupro patch ordered last visit, but not covered by insurance)  - continue oxycodone as needed for cellulitis pain; may need long term in palliative approach  - other medical options limited due to chronic kidney disease, age, gait, anti-coagulation   ANEMIA  - chronic anticoagulation; follow up with PCP   Return for pending if symptoms worsen or fail to improve, return to PCP.    Penni Bombard, MD 2/44/0102, 7:25 PM Certified in Neurology, Neurophysiology and Neuroimaging  [facility] [address] [place], [place] [address] [phone]

## 2022-05-09 ENCOUNTER — Other Ambulatory Visit: Payer: Self-pay

## 2022-05-09 ENCOUNTER — Emergency Department (HOSPITAL_COMMUNITY): Payer: Medicare Other

## 2022-05-09 ENCOUNTER — Inpatient Hospital Stay (HOSPITAL_COMMUNITY)
Admission: EM | Admit: 2022-05-09 | Discharge: 2022-05-15 | DRG: 291 | Disposition: A | Discharge status: Skilled Nursing Facility | Payer: Medicare Other | Source: Skilled Nursing Facility | Attending: Internal Medicine | Admitting: Internal Medicine

## 2022-05-09 ENCOUNTER — Encounter (HOSPITAL_COMMUNITY): Payer: Self-pay

## 2022-05-09 DIAGNOSIS — E785 Hyperlipidemia, unspecified: Secondary | ICD-10-CM | POA: Diagnosis not present

## 2022-05-09 DIAGNOSIS — Z87891 Personal history of nicotine dependence: Secondary | ICD-10-CM

## 2022-05-09 DIAGNOSIS — I739 Peripheral vascular disease, unspecified: Secondary | ICD-10-CM | POA: Diagnosis present

## 2022-05-09 DIAGNOSIS — R8271 Bacteriuria: Secondary | ICD-10-CM

## 2022-05-09 DIAGNOSIS — D638 Anemia in other chronic diseases classified elsewhere: Secondary | ICD-10-CM | POA: Diagnosis present

## 2022-05-09 DIAGNOSIS — G629 Polyneuropathy, unspecified: Secondary | ICD-10-CM | POA: Diagnosis present

## 2022-05-09 DIAGNOSIS — Z66 Do not resuscitate: Secondary | ICD-10-CM | POA: Diagnosis present

## 2022-05-09 DIAGNOSIS — Z8679 Personal history of other diseases of the circulatory system: Secondary | ICD-10-CM

## 2022-05-09 DIAGNOSIS — L98429 Non-pressure chronic ulcer of back with unspecified severity: Secondary | ICD-10-CM | POA: Diagnosis present

## 2022-05-09 DIAGNOSIS — I5033 Acute on chronic diastolic (congestive) heart failure: Secondary | ICD-10-CM

## 2022-05-09 DIAGNOSIS — Z7982 Long term (current) use of aspirin: Secondary | ICD-10-CM

## 2022-05-09 DIAGNOSIS — Z79899 Other long term (current) drug therapy: Secondary | ICD-10-CM

## 2022-05-09 DIAGNOSIS — E039 Hypothyroidism, unspecified: Secondary | ICD-10-CM | POA: Diagnosis present

## 2022-05-09 DIAGNOSIS — J438 Other emphysema: Secondary | ICD-10-CM | POA: Diagnosis present

## 2022-05-09 DIAGNOSIS — J90 Pleural effusion, not elsewhere classified: Secondary | ICD-10-CM

## 2022-05-09 DIAGNOSIS — I7123 Aneurysm of the descending thoracic aorta, without rupture: Secondary | ICD-10-CM | POA: Diagnosis present

## 2022-05-09 DIAGNOSIS — E669 Obesity, unspecified: Secondary | ICD-10-CM | POA: Diagnosis present

## 2022-05-09 DIAGNOSIS — D469 Myelodysplastic syndrome, unspecified: Secondary | ICD-10-CM | POA: Diagnosis present

## 2022-05-09 DIAGNOSIS — Z79890 Hormone replacement therapy (postmenopausal): Secondary | ICD-10-CM

## 2022-05-09 DIAGNOSIS — I493 Ventricular premature depolarization: Secondary | ICD-10-CM | POA: Diagnosis present

## 2022-05-09 DIAGNOSIS — Z823 Family history of stroke: Secondary | ICD-10-CM

## 2022-05-09 DIAGNOSIS — F05 Delirium due to known physiological condition: Secondary | ICD-10-CM | POA: Diagnosis present

## 2022-05-09 DIAGNOSIS — M069 Rheumatoid arthritis, unspecified: Secondary | ICD-10-CM | POA: Diagnosis present

## 2022-05-09 DIAGNOSIS — N400 Enlarged prostate without lower urinary tract symptoms: Secondary | ICD-10-CM | POA: Diagnosis present

## 2022-05-09 DIAGNOSIS — L03116 Cellulitis of left lower limb: Secondary | ICD-10-CM | POA: Diagnosis not present

## 2022-05-09 DIAGNOSIS — I130 Hypertensive heart and chronic kidney disease with heart failure and stage 1 through stage 4 chronic kidney disease, or unspecified chronic kidney disease: Secondary | ICD-10-CM | POA: Diagnosis present

## 2022-05-09 DIAGNOSIS — D649 Anemia, unspecified: Secondary | ICD-10-CM | POA: Diagnosis not present

## 2022-05-09 DIAGNOSIS — I10 Essential (primary) hypertension: Secondary | ICD-10-CM | POA: Diagnosis not present

## 2022-05-09 DIAGNOSIS — Z6834 Body mass index (BMI) 34.0-34.9, adult: Secondary | ICD-10-CM

## 2022-05-09 DIAGNOSIS — I4821 Permanent atrial fibrillation: Secondary | ICD-10-CM | POA: Diagnosis present

## 2022-05-09 DIAGNOSIS — Z9841 Cataract extraction status, right eye: Secondary | ICD-10-CM

## 2022-05-09 DIAGNOSIS — I2510 Atherosclerotic heart disease of native coronary artery without angina pectoris: Secondary | ICD-10-CM | POA: Diagnosis present

## 2022-05-09 DIAGNOSIS — R0600 Dyspnea, unspecified: Secondary | ICD-10-CM

## 2022-05-09 DIAGNOSIS — N179 Acute kidney failure, unspecified: Secondary | ICD-10-CM | POA: Diagnosis present

## 2022-05-09 DIAGNOSIS — Z20822 Contact with and (suspected) exposure to covid-19: Secondary | ICD-10-CM | POA: Diagnosis present

## 2022-05-09 DIAGNOSIS — J9811 Atelectasis: Secondary | ICD-10-CM | POA: Diagnosis present

## 2022-05-09 DIAGNOSIS — I480 Paroxysmal atrial fibrillation: Secondary | ICD-10-CM | POA: Diagnosis present

## 2022-05-09 DIAGNOSIS — E038 Other specified hypothyroidism: Secondary | ICD-10-CM | POA: Diagnosis not present

## 2022-05-09 DIAGNOSIS — D696 Thrombocytopenia, unspecified: Secondary | ICD-10-CM | POA: Diagnosis present

## 2022-05-09 DIAGNOSIS — N184 Chronic kidney disease, stage 4 (severe): Secondary | ICD-10-CM | POA: Diagnosis present

## 2022-05-09 DIAGNOSIS — M109 Gout, unspecified: Secondary | ICD-10-CM | POA: Diagnosis present

## 2022-05-09 DIAGNOSIS — J432 Centrilobular emphysema: Secondary | ICD-10-CM | POA: Diagnosis present

## 2022-05-09 DIAGNOSIS — I081 Rheumatic disorders of both mitral and tricuspid valves: Secondary | ICD-10-CM | POA: Diagnosis present

## 2022-05-09 DIAGNOSIS — L89156 Pressure-induced deep tissue damage of sacral region: Secondary | ICD-10-CM | POA: Diagnosis present

## 2022-05-09 DIAGNOSIS — E66811 Obesity, class 1: Secondary | ICD-10-CM | POA: Diagnosis present

## 2022-05-09 DIAGNOSIS — L89626 Pressure-induced deep tissue damage of left heel: Secondary | ICD-10-CM | POA: Diagnosis present

## 2022-05-09 DIAGNOSIS — I482 Chronic atrial fibrillation, unspecified: Secondary | ICD-10-CM | POA: Diagnosis present

## 2022-05-09 DIAGNOSIS — J9601 Acute respiratory failure with hypoxia: Secondary | ICD-10-CM | POA: Diagnosis present

## 2022-05-09 DIAGNOSIS — I5031 Acute diastolic (congestive) heart failure: Secondary | ICD-10-CM | POA: Diagnosis not present

## 2022-05-09 DIAGNOSIS — L89616 Pressure-induced deep tissue damage of right heel: Secondary | ICD-10-CM | POA: Diagnosis present

## 2022-05-09 DIAGNOSIS — Z8673 Personal history of transient ischemic attack (TIA), and cerebral infarction without residual deficits: Secondary | ICD-10-CM

## 2022-05-09 DIAGNOSIS — J189 Pneumonia, unspecified organism: Principal | ICD-10-CM

## 2022-05-09 DIAGNOSIS — K4090 Unilateral inguinal hernia, without obstruction or gangrene, not specified as recurrent: Secondary | ICD-10-CM

## 2022-05-09 DIAGNOSIS — Z9842 Cataract extraction status, left eye: Secondary | ICD-10-CM

## 2022-05-09 DIAGNOSIS — Z801 Family history of malignant neoplasm of trachea, bronchus and lung: Secondary | ICD-10-CM

## 2022-05-09 DIAGNOSIS — N189 Chronic kidney disease, unspecified: Secondary | ICD-10-CM | POA: Diagnosis present

## 2022-05-09 DIAGNOSIS — J181 Lobar pneumonia, unspecified organism: Secondary | ICD-10-CM

## 2022-05-09 DIAGNOSIS — Z91041 Radiographic dye allergy status: Secondary | ICD-10-CM

## 2022-05-09 DIAGNOSIS — K219 Gastro-esophageal reflux disease without esophagitis: Secondary | ICD-10-CM | POA: Diagnosis present

## 2022-05-09 DIAGNOSIS — D509 Iron deficiency anemia, unspecified: Secondary | ICD-10-CM | POA: Diagnosis present

## 2022-05-09 LAB — I-STAT VENOUS BLOOD GAS, ED
Acid-base deficit: 4 mmol/L — ABNORMAL HIGH (ref 0.0–2.0)
Bicarbonate: 22.2 mmol/L (ref 20.0–28.0)
Calcium, Ion: 1.42 mmol/L — ABNORMAL HIGH (ref 1.15–1.40)
HCT: 26 % — ABNORMAL LOW (ref 39.0–52.0)
Hemoglobin: 8.8 g/dL — ABNORMAL LOW (ref 13.0–17.0)
O2 Saturation: 92 %
Potassium: 4.7 mmol/L (ref 3.5–5.1)
Sodium: 142 mmol/L (ref 135–145)
TCO2: 24 mmol/L (ref 22–32)
pCO2, Ven: 47.1 mmHg (ref 44–60)
pH, Ven: 7.281 (ref 7.25–7.43)
pO2, Ven: 74 mmHg — ABNORMAL HIGH (ref 32–45)

## 2022-05-09 LAB — URINALYSIS, ROUTINE W REFLEX MICROSCOPIC
Bilirubin Urine: NEGATIVE
Glucose, UA: NEGATIVE mg/dL
Hgb urine dipstick: NEGATIVE
Ketones, ur: NEGATIVE mg/dL
Nitrite: NEGATIVE
Protein, ur: NEGATIVE mg/dL
Specific Gravity, Urine: 1.015 (ref 1.005–1.030)
pH: 5 (ref 5.0–8.0)

## 2022-05-09 LAB — RAPID URINE DRUG SCREEN, HOSP PERFORMED
Amphetamines: NOT DETECTED
Barbiturates: NOT DETECTED
Benzodiazepines: NOT DETECTED
Cocaine: NOT DETECTED
Opiates: POSITIVE — AB
Tetrahydrocannabinol: NOT DETECTED

## 2022-05-09 LAB — COMPREHENSIVE METABOLIC PANEL
ALT: 14 U/L (ref 0–44)
AST: 17 U/L (ref 15–41)
Albumin: 2.8 g/dL — ABNORMAL LOW (ref 3.5–5.0)
Alkaline Phosphatase: 158 U/L — ABNORMAL HIGH (ref 38–126)
Anion gap: 8 (ref 5–15)
BUN: 40 mg/dL — ABNORMAL HIGH (ref 8–23)
CO2: 21 mmol/L — ABNORMAL LOW (ref 22–32)
Calcium: 10.2 mg/dL (ref 8.9–10.3)
Chloride: 113 mmol/L — ABNORMAL HIGH (ref 98–111)
Creatinine, Ser: 2.19 mg/dL — ABNORMAL HIGH (ref 0.61–1.24)
GFR, Estimated: 28 mL/min — ABNORMAL LOW (ref >60)
Glucose, Bld: 85 mg/dL (ref 70–99)
Potassium: 4.4 mmol/L (ref 3.5–5.1)
Sodium: 142 mmol/L (ref 135–145)
Total Bilirubin: 0.9 mg/dL (ref 0.3–1.2)
Total Protein: 6.2 g/dL — ABNORMAL LOW (ref 6.5–8.1)

## 2022-05-09 LAB — RESP PANEL BY RT-PCR (FLU A&B, COVID) ARPGX2
Influenza A by PCR: NEGATIVE
Influenza B by PCR: NEGATIVE
SARS Coronavirus 2 by RT PCR: NEGATIVE

## 2022-05-09 LAB — PROCALCITONIN: Procalcitonin: 0.1 ng/mL

## 2022-05-09 LAB — CBC WITH DIFFERENTIAL/PLATELET
Abs Immature Granulocytes: 0.04 10*3/uL (ref 0.00–0.07)
Basophils Absolute: 0.1 10*3/uL (ref 0.0–0.1)
Basophils Relative: 1 %
Eosinophils Absolute: 0.3 10*3/uL (ref 0.0–0.5)
Eosinophils Relative: 4 %
HCT: 28.6 % — ABNORMAL LOW (ref 39.0–52.0)
Hemoglobin: 8.8 g/dL — ABNORMAL LOW (ref 13.0–17.0)
Immature Granulocytes: 1 %
Lymphocytes Relative: 9 %
Lymphs Abs: 0.7 10*3/uL (ref 0.7–4.0)
MCH: 33 pg (ref 26.0–34.0)
MCHC: 30.8 g/dL (ref 30.0–36.0)
MCV: 107.1 fL — ABNORMAL HIGH (ref 80.0–100.0)
Monocytes Absolute: 0.7 10*3/uL (ref 0.1–1.0)
Monocytes Relative: 9 %
Neutro Abs: 5.7 10*3/uL (ref 1.7–7.7)
Neutrophils Relative %: 76 %
Platelets: 188 10*3/uL (ref 150–400)
RBC: 2.67 MIL/uL — ABNORMAL LOW (ref 4.22–5.81)
RDW: 15 % (ref 11.5–15.5)
WBC: 7.4 10*3/uL (ref 4.0–10.5)
nRBC: 0 % (ref 0.0–0.2)

## 2022-05-09 LAB — BRAIN NATRIURETIC PEPTIDE: B Natriuretic Peptide: 589.5 pg/mL — ABNORMAL HIGH (ref 0.0–100.0)

## 2022-05-09 LAB — TROPONIN I (HIGH SENSITIVITY)
Troponin I (High Sensitivity): 25 ng/L — ABNORMAL HIGH (ref <18)
Troponin I (High Sensitivity): 23 ng/L — ABNORMAL HIGH (ref <18)

## 2022-05-09 LAB — DIGOXIN LEVEL: Digoxin Level: 0.8 ng/mL (ref 0.8–2.0)

## 2022-05-09 LAB — TSH: TSH: 4.79 u[IU]/mL — ABNORMAL HIGH (ref 0.350–4.500)

## 2022-05-09 LAB — MAGNESIUM: Magnesium: 2 mg/dL (ref 1.7–2.4)

## 2022-05-09 LAB — AMMONIA: Ammonia: 21 umol/L (ref 9–35)

## 2022-05-09 MED ORDER — TAMSULOSIN HCL 0.4 MG PO CAPS
0.4000 mg | ORAL_CAPSULE | Freq: Every day | ORAL | Status: DC
  Administered 2022-05-10 – 2022-05-15 (×6): 0.4 mg via ORAL
  Filled 2022-05-09 (×6): qty 1

## 2022-05-09 MED ORDER — LEVOTHYROXINE SODIUM 100 MCG PO TABS
100.0000 ug | ORAL_TABLET | Freq: Every day | ORAL | Status: DC
  Administered 2022-05-10 – 2022-05-15 (×6): 100 ug via ORAL
  Filled 2022-05-09 (×6): qty 1

## 2022-05-09 MED ORDER — FUROSEMIDE 10 MG/ML IJ SOLN: 40.0000 mg | Freq: Every day | INTRAMUSCULAR | Status: DC

## 2022-05-09 MED ORDER — FERROUS SULFATE 325 (65 FE) MG PO TABS
325.0000 mg | ORAL_TABLET | Freq: Every day | ORAL | Status: DC
  Administered 2022-05-10 – 2022-05-15 (×6): 325 mg via ORAL
  Filled 2022-05-09 (×6): qty 1

## 2022-05-09 MED ORDER — ONDANSETRON HCL 4 MG/2ML IJ SOLN
4.0000 mg | Freq: Four times a day (QID) | INTRAMUSCULAR | Status: DC | PRN
Start: 2022-05-09 — End: 2022-05-15
  Administered 2022-05-09: 4 mg via INTRAVENOUS
  Filled 2022-05-09: qty 2

## 2022-05-09 MED ORDER — GABAPENTIN 300 MG PO CAPS
300.0000 mg | ORAL_CAPSULE | Freq: Two times a day (BID) | ORAL | Status: DC
  Administered 2022-05-09 – 2022-05-15 (×12): 300 mg via ORAL
  Filled 2022-05-09 (×12): qty 1

## 2022-05-09 MED ORDER — TRAMADOL HCL 50 MG PO TABS
50.0000 mg | ORAL_TABLET | Freq: Three times a day (TID) | ORAL | Status: DC | PRN
  Administered 2022-05-09 – 2022-05-13 (×9): 50 mg via ORAL
  Filled 2022-05-09 (×10): qty 1

## 2022-05-09 MED ORDER — ACETAMINOPHEN 325 MG PO TABS
650.0000 mg | ORAL_TABLET | Freq: Four times a day (QID) | ORAL | Status: DC | PRN
  Administered 2022-05-12: 650 mg via ORAL
  Filled 2022-05-09 (×2): qty 2

## 2022-05-09 MED ORDER — POLYVINYL ALCOHOL 1.4 % OP SOLN
1.0000 [drp] | Freq: Four times a day (QID) | OPHTHALMIC | Status: DC
  Administered 2022-05-09 – 2022-05-15 (×19): 1 [drp] via OPHTHALMIC
  Filled 2022-05-09: qty 15

## 2022-05-09 MED ORDER — DOXYCYCLINE HYCLATE 100 MG PO TABS
100.0000 mg | ORAL_TABLET | Freq: Once | ORAL | Status: AC
  Administered 2022-05-09: 100 mg via ORAL
  Filled 2022-05-09: qty 1

## 2022-05-09 MED ORDER — SODIUM CHLORIDE 0.9% FLUSH
3.0000 mL | INTRAVENOUS | Status: DC | PRN
Start: 2022-05-09 — End: 2022-05-15

## 2022-05-09 MED ORDER — PROSIGHT PO TABS
1.0000 | ORAL_TABLET | Freq: Two times a day (BID) | ORAL | Status: DC
  Administered 2022-05-09 – 2022-05-15 (×12): 1 via ORAL
  Filled 2022-05-09 (×12): qty 1

## 2022-05-09 MED ORDER — SODIUM CHLORIDE 0.9 % IV SOLN
250.0000 mL | INTRAVENOUS | Status: DC | PRN
Start: 2022-05-09 — End: 2022-05-15
  Administered 2022-05-10: 250 mL via INTRAVENOUS

## 2022-05-09 MED ORDER — SODIUM CHLORIDE 0.9 % IV SOLN
1.0000 g | Freq: Once | INTRAVENOUS | Status: AC
Start: 2022-05-09 — End: 2022-05-09
  Administered 2022-05-09: 1 g via INTRAVENOUS
  Filled 2022-05-09: qty 10

## 2022-05-09 MED ORDER — SALINE SPRAY 0.65 % NA SOLN
2.0000 | NASAL | Status: DC
Start: 2022-05-09 — End: 2022-05-15
  Administered 2022-05-09 – 2022-05-15 (×16): 2 via NASAL
  Filled 2022-05-09: qty 44

## 2022-05-09 MED ORDER — ALBUTEROL SULFATE HFA 108 (90 BASE) MCG/ACT IN AERS
1.0000 | INHALATION_SPRAY | Freq: Once | RESPIRATORY_TRACT | Status: AC
  Administered 2022-05-09: 2 via RESPIRATORY_TRACT
  Filled 2022-05-09: qty 6.7

## 2022-05-09 MED ORDER — ATORVASTATIN CALCIUM 40 MG PO TABS
40.0000 mg | ORAL_TABLET | Freq: Every day | ORAL | Status: DC
  Administered 2022-05-09 – 2022-05-15 (×7): 40 mg via ORAL
  Filled 2022-05-09 (×7): qty 1

## 2022-05-09 MED ORDER — SODIUM CHLORIDE 0.9% FLUSH
3.0000 mL | Freq: Two times a day (BID) | INTRAVENOUS | Status: DC
  Administered 2022-05-09 – 2022-05-15 (×11): 3 mL via INTRAVENOUS

## 2022-05-09 MED ORDER — LEVALBUTEROL HCL 0.63 MG/3ML IN NEBU
0.6300 mg | INHALATION_SOLUTION | Freq: Four times a day (QID) | RESPIRATORY_TRACT | Status: DC | PRN
  Administered 2022-05-09: 0.63 mg via RESPIRATORY_TRACT
  Filled 2022-05-09: qty 3

## 2022-05-09 MED ORDER — ALLOPURINOL 100 MG PO TABS
50.0000 mg | ORAL_TABLET | ORAL | Status: DC
  Administered 2022-05-11 – 2022-05-15 (×3): 50 mg via ORAL
  Filled 2022-05-09 (×4): qty 1

## 2022-05-09 MED ORDER — ENOXAPARIN SODIUM 30 MG/0.3ML IJ SOSY
30.0000 mg | PREFILLED_SYRINGE | INTRAMUSCULAR | Status: DC
  Administered 2022-05-09 – 2022-05-14 (×6): 30 mg via SUBCUTANEOUS
  Filled 2022-05-09 (×6): qty 0.3

## 2022-05-09 MED ORDER — SACCHAROMYCES BOULARDII 250 MG PO CAPS
250.0000 mg | ORAL_CAPSULE | Freq: Two times a day (BID) | ORAL | Status: DC
  Administered 2022-05-09 – 2022-05-15 (×12): 250 mg via ORAL
  Filled 2022-05-09 (×13): qty 1

## 2022-05-09 MED ORDER — ASPIRIN 81 MG PO TBEC
81.0000 mg | DELAYED_RELEASE_TABLET | Freq: Every day | ORAL | Status: DC
  Administered 2022-05-10 – 2022-05-15 (×6): 81 mg via ORAL
  Filled 2022-05-09 (×6): qty 1

## 2022-05-09 MED ORDER — LINEZOLID 600 MG/300ML IV SOLN
600.0000 mg | Freq: Two times a day (BID) | INTRAVENOUS | Status: DC
Start: 2022-05-09 — End: 2022-05-10
  Administered 2022-05-09: 600 mg via INTRAVENOUS
  Filled 2022-05-09 (×2): qty 300

## 2022-05-09 MED ORDER — VANCOMYCIN HCL 1750 MG/350ML IV SOLN
1750.0000 mg | INTRAVENOUS | Status: DC
Start: 2022-05-09 — End: 2022-05-09
  Filled 2022-05-09: qty 350

## 2022-05-09 MED ORDER — SENNOSIDES-DOCUSATE SODIUM 8.6-50 MG PO TABS
1.0000 | ORAL_TABLET | Freq: Two times a day (BID) | ORAL | Status: DC
  Administered 2022-05-09 – 2022-05-15 (×10): 1 via ORAL
  Filled 2022-05-09 (×12): qty 1

## 2022-05-09 MED ORDER — ACETAMINOPHEN 650 MG RE SUPP: 650.0000 mg | Freq: Four times a day (QID) | RECTAL | Status: DC | PRN

## 2022-05-09 MED ORDER — DIGOXIN 125 MCG PO TABS
0.1250 mg | ORAL_TABLET | ORAL | Status: DC
  Administered 2022-05-11 – 2022-05-15 (×3): 0.125 mg via ORAL
  Filled 2022-05-09 (×4): qty 1

## 2022-05-09 MED ORDER — PANTOPRAZOLE SODIUM 40 MG PO TBEC
40.0000 mg | DELAYED_RELEASE_TABLET | Freq: Every day | ORAL | Status: DC
  Administered 2022-05-10 – 2022-05-15 (×6): 40 mg via ORAL
  Filled 2022-05-09 (×6): qty 1

## 2022-05-09 MED ORDER — CARVEDILOL 6.25 MG PO TABS
6.2500 mg | ORAL_TABLET | Freq: Two times a day (BID) | ORAL | Status: DC
  Administered 2022-05-10 – 2022-05-11 (×4): 6.25 mg via ORAL
  Filled 2022-05-09 (×4): qty 1

## 2022-05-09 MED ORDER — PRESERVISION AREDS PO CAPS: ORAL_CAPSULE | Freq: Two times a day (BID) | ORAL | Status: DC

## 2022-05-09 MED ORDER — GERHARDT'S BUTT CREAM
TOPICAL_CREAM | Freq: Two times a day (BID) | CUTANEOUS | Status: DC
  Filled 2022-05-09: qty 1

## 2022-05-09 MED ORDER — SODIUM CHLORIDE 0.9 % IV SOLN: 2.0000 g | INTRAVENOUS | Status: DC

## 2022-05-09 MED ORDER — GERHARDT'S BUTT CREAM
TOPICAL_CREAM | Freq: Every day | CUTANEOUS | Status: DC | PRN
Start: 2022-05-09 — End: 2022-05-15

## 2022-05-09 MED ORDER — FUROSEMIDE 10 MG/ML IJ SOLN
40.0000 mg | Freq: Once | INTRAMUSCULAR | Status: AC
  Administered 2022-05-09: 40 mg via INTRAVENOUS
  Filled 2022-05-09: qty 4

## 2022-05-09 MED ORDER — SODIUM CHLORIDE 0.9 % IV SOLN
2.0000 g | Freq: Once | INTRAVENOUS | Status: AC
  Administered 2022-05-09: 2 g via INTRAVENOUS
  Filled 2022-05-09: qty 12.5

## 2022-05-09 MED ORDER — ROPINIROLE HCL 1 MG PO TABS
3.0000 mg | ORAL_TABLET | Freq: Every day | ORAL | Status: DC
  Administered 2022-05-09 – 2022-05-14 (×6): 3 mg via ORAL
  Filled 2022-05-09 (×3): qty 6
  Filled 2022-05-09 (×3): qty 3
  Filled 2022-05-09: qty 6

## 2022-05-09 NOTE — Assessment & Plan Note (Signed)
Moderate to large pleural effusion with worsening shortness of breath Has been stable on room air, but if moves around a lot oxygen will drop to 88-89%.  He tells me has a history of needing x3 thoracentesis done then had to have a surgery, he was not living here and can't recall why he had the fluid in his lungs  Will likely need IR vs. pulm to drain this for therapeutic/diagnostic reasons, but favor CHF exacerbation.  Repeat 2V CXR in AM

## 2022-05-09 NOTE — Assessment & Plan Note (Addendum)
Chronic appearing Continue wound care, prevent pressure, out of bed to chair tid with meals While in bed with pressure boots in place.  Continue pain control with tramadol, will reduce dose to 25 mg to prevent oversedation.  Right heel pressure ulcer.(present on admission, not able to stage).  Continue local wound care.

## 2022-05-09 NOTE — H&P (Signed)
History and Physical    Patient: Steven Williams KDX:[id number] DOB: 07/08/1930 DOA: 05/09/2022 DOS: the patient was seen and examined on 05/09/2022 PCP: Ginger Organ., MD  Patient coming from: SNF - [facility] for rehab. Uses walker and cane. Typically at [place].    Chief Complaint: shortness of breath   HPI: Steven Williams is a 88 y.o. male with medical history significant of   symptomatic bradycardia with pacer, CKD stage IV, chronic anemia and thrombocytopenia, thoracic aortic aneurysm followed by CT surgery, and paroxysmal atrial fibrillation off of anticoagulation for the past month due to severe epistaxis who presented to Ed with worsening shortness of breath. Over the past week he has had progressively worse shortness of breath and has had trouble putting his thoughts together. They stopped his pain medication from oxycodone and put him on tramadol. He has been lethargic all week and when his daughter went in this morning she didn't like the way he looked. He had audible wheezing and looked like he was having difficulty breathing. He has no coughing. His leg swelling had improved with the 4 days of lasix. Possible orthopnea.   Recently admitted 8/12-8/21 for cellulitis. Completed 7 days of rocephin. He also had ABIs done which were grossly abnormal and vascular surgery consulted. He underwent an angiogram of LLE which revealed occluded anterior and posterior tibial arteries and he was started on ASA and a statin. His lasix was held due to acute on chronic renal failure at discharge.   Seen in ED last Friday for bruising over his abdomen and had some complaints of shortness of breath. lasix given x 4 days only. He has not been taking this daily.   Denies any fever/chills, vision changes/headaches, chest pain or palpitations,   cough, abdominal pain, N/V/D, dysuria. He complains of his left leg hurting and has a sore on his sacrum.    He does not smoke. He drinks 1 glass of wine  daily.   ER Course:  vitals: temp: 96.8, bp: 155/64, HR: 74, RR: 20, oxygen: 96%RA Pertinent labs: hgb: 8.8, BUN: 40, creatinine: 2.19, BNP: 589, troponin 25>23,  CXR: cardiomegaly and pleural effusions which are larger in interval and larger on right than left. Developing pneumonia not excluded.  CT chest: 1. Interval development of right lower lobe collapse/consolidation with moderate to large right pleural effusion. 2. Patchy airspace disease left base with small left pleural effusion. 3. Of the thoracic aortic aneurysm measuring up to 4.1 cm diameter in the ascending segment CT head: no acute finding. Age indeterminate lacunar infarcts in right basal ganglia.  In ED: given rocephin and doxy then changed ot vanc and cefepime. '40mg'$  of lasix. TRH asked to admit.    Review of Systems: As mentioned in the history of present illness. All other systems reviewed and are negative. Past Medical History:  Diagnosis Date   Antiphospholipid antibody positive    Aortic aneurysm ([place])    thoracic, Dr Roxan Hockey, with h/o dissection   BPH (benign prostatic hyperplasia)    Central retinal artery occlusion    Chronic anemia    Chronic renal insufficiency    COPD (chronic obstructive pulmonary disease) (HCC)    DDD (degenerative disc disease), cervical    DJD (degenerative joint disease)    DVT (deep venous thrombosis) (HCC)    Gastric dysmotility    GERD (gastroesophageal reflux disease)    Gout    HTN (hypertension)    Hyperlipidemia    Hypertensive retinopathy  OU   Hypothyroid    Macular degeneration    Myelodysplasia (myelodysplastic syndrome) (HCC)    Paget's disease of bone    Peripheral neuropathy    Permanent atrial fibrillation (HCC)    Recurrent epistaxis 06/26/2020   Rheumatoid arthritis (HCC)    TIA (transient ischemic attack)    Past Surgical History:  Procedure Laterality Date   ABDOMINAL AORTOGRAM W/LOWER EXTREMITY Left 04/24/2022   Procedure: ABDOMINAL AORTOGRAM  W/LOWER EXTREMITY;  Surgeon: Cherre Robins, MD;  Location: [facility];  Service: Cardiovascular;  Laterality: Left;   ATRIAL FIBRILLATION ABLATION  09/23/2014   Dr Encarnacion Chu in [place]     lumbar, x 2   CAROTID ENDARTERECTOMY Right 1990   CAROTID STENT     CATARACT EXTRACTION Bilateral 2010   [place]   EYE SURGERY Bilateral 2010   [facility]   LUNG DECORTICATION     VATS   PACEMAKER GENERATOR CHANGE  12/03/2011   [facility] [id number] generator change by Dr Encarnacion Chu for symptomatic bradycardia   PACEMAKER IMPLANT  2004   MDT    Social History:  reports that he quit smoking about 59 years ago. His smoking use included cigarettes. He has a 24.00 pack-year smoking history. He has never used smokeless tobacco. He reports current alcohol use of about 2.0 standard drinks of alcohol per week. He reports that he does not use drugs.  Allergies  Allergen Reactions   Iodinated Contrast Media Rash    Family History  Problem Relation Age of Onset   Cirrhosis Mother    Stroke Father    Lung cancer Sister    Lung cancer Brother     Prior to Admission medications   Medication Sig Start Date End Date Taking? Authorizing Provider  acetaminophen (TYLENOL) 500 MG tablet Take 2 tablets (1,000 mg total) by mouth every 6 (six) hours as needed. 04/27/22  Yes Dwyane Dee, MD  allopurinol (ZYLOPRIM) 100 MG tablet Take 0.5 tablets (50 mg total) by mouth every other day. 04/27/22  Yes Dwyane Dee, MD  aspirin EC 81 MG tablet Take 1 tablet (81 mg total) by mouth daily. Swallow whole. 04/28/22  Yes Dwyane Dee, MD  atorvastatin (LIPITOR) 40 MG tablet Take 1 tablet (40 mg total) by mouth daily. 04/28/22  Yes Dwyane Dee, MD  azithromycin (ZITHROMAX) 250 MG tablet Take 250 mg by mouth daily.   Yes [provider]  carvedilol (COREG) 6.25 MG tablet Take 6.25 mg by mouth 2 (two) times daily with a meal.   Yes [provider]  cephALEXin (KEFLEX) 500 MG capsule Take 500  mg by mouth 3 (three) times daily. For 10 days starting on 04/29/22   Yes [provider]  cholecalciferol (VITAMIN D3) 25 MCG (1000 UNIT) tablet Take 2,000 Units by mouth daily.   Yes [provider]  digoxin (LANOXIN) 0.125 MG tablet Take 0.125 mg by mouth every Monday, Wednesday, and Friday.   Yes [provider]  Ferrous Sulfate (IRON) 325 (65 Fe) MG TABS Take 1 tablet (325 mg total) by mouth daily with breakfast. 04/27/22  Yes Dwyane Dee, MD  gabapentin (NEURONTIN) 300 MG capsule Take 1 capsule (300 mg total) by mouth 2 (two) times daily. 04/27/22  Yes Dwyane Dee, MD  hydrocortisone (ANUSOL-HC) 2.5 % rectal cream Apply 1 application topically 2 (two) times daily as needed for hemorrhoids or anal itching. 04/15/21  Yes [provider]  LACTATED RINGERS IV Inject into the vein. 75  ml/1 hr over 12 hours   Yes [provider]  levothyroxine (SYNTHROID) 100 MCG tablet Take 100 mcg by mouth daily before breakfast. 06/04/20  Yes [provider]  Multiple Vitamins-Minerals (PRESERVISION AREDS PO) Take 1 capsule by mouth 2 (two) times daily.   Yes [provider]  pantoprazole (PROTONIX) 40 MG tablet Take 40 mg by mouth daily. 06/04/20  Yes [provider]  Polyvinyl Alcohol-Povidone (REFRESH OP) Place 1 drop into both eyes 4 (four) times daily.   Yes [provider]  rOPINIRole (REQUIP) 1 MG tablet Take 3 tablets (3 mg total) by mouth at bedtime. 04/27/22  Yes Dwyane Dee, MD  saccharomyces boulardii (FLORASTOR) 250 MG capsule Take 250 mg by mouth 2 (two) times daily. For 14 days starting 05/08/22   Yes [provider]  saw palmetto 500 MG capsule Take 500 mg by mouth daily.   Yes [provider]  senna-docusate (SENEXON-S) 8.6-50 MG tablet Take 1 tablet by mouth 2 (two) times daily.   Yes [provider]  sodium chloride (OCEAN) 0.65 % SOLN nasal spray Place 2 sprays into both nostrils every 4  (four) hours while awake. 01/11/22 05/09/22 Yes Pahwani, Einar Grad, MD  tamsulosin (FLOMAX) 0.4 MG CAPS capsule Take 0.4 mg by mouth daily.   Yes [provider]  traMADol (ULTRAM) 50 MG tablet Take 1 tablet (50 mg total) by mouth every 8 (eight) hours as needed for moderate pain. Patient taking differently: Take 50 mg by mouth every 6 (six) hours. 04/27/22  Yes Dwyane Dee, MD  vitamin B-12 (CYANOCOBALAMIN) 1000 MCG tablet Take 1 tablet (1,000 mcg total) by mouth daily. 07/09/21  Yes Orson Slick, MD  Cholecalciferol (VITAMIN D3) 50 MCG (2000 UT) TABS Take 2,000 Units by mouth daily. Patient not taking: Reported on 05/09/2022    [provider]  doxycycline (VIBRAMYCIN) 100 MG capsule Take 100 mg by mouth 2 (two) times daily. Patient not taking: Reported on 05/09/2022    [provider]  furosemide (LASIX) 20 MG tablet Take 1 tablet (20 mg total) by mouth daily for 4 days. 05/01/22 05/05/22  Blanchie Dessert, MD  oxyCODONE-acetaminophen (PERCOCET/ROXICET) 5-325 MG tablet Take by mouth every 4 (four) hours as needed for severe pain. Patient not taking: Reported on 05/09/2022    [provider]  senna-docusate (SENOKOT-S) 8.6-50 MG tablet Take 1 tablet by mouth 2 (two) times daily. Patient not taking: Reported on 05/09/2022 04/27/22   Dwyane Dee, MD    Physical Exam: Vitals:   05/09/22 1500 05/09/22 1600 05/09/22 1800 05/09/22 1830  BP: (!) 140/72 133/81 129/64 (!) 121/56  Pulse: (!) 101 (!) 56 (!) 57 80  Resp: '13 17 18 19  '$ Temp:  (!) 96.8 F (36 C) (!) 97 F (36.1 C)   TempSrc:  Rectal Oral   SpO2: 91% 97% 100% 92%  Weight:      Height:       General:  Appears calm and comfortable and is in NAD Eyes:  PERRL, EOMI, normal lids, iris ENT:  grossly normal hearing, lips & tongue, mmm; appropriate dentition Neck:  no LAD, masses or thyromegaly; no carotid bruits +JVD Cardiovascular:  regularly, irregular, +systolic murmur. 1+ LE edema  Respiratory:   decreased  breath sounds over right lung field. Supraglottic wheezing. No crackles/rales. Dyspnea with talking.  Abdomen:  soft, NT, ND, NABS. Left inguinal hernia-not reducible.  Back:   normal alignment, no CVAT Skin:  bruising on extremities. Open wound to left  heel. Sacral ulcer with some granulation tissue, not full exposure.   Musculoskeletal:  grossly normal tone BUE/BLE, good ROM, no bony abnormality Lower extremity:   erythema and edema to bilateral LE. Open wound on left heel, see above. Thready pulses.  Psychiatric:  grossly normal mood and affect, speech fluent and appropriate, AOx3 Neurologic:  CN 2-12 grossly intact, moves all extremities in coordinated fashion, sensation intact   Radiological Exams on Admission: Independently reviewed - see discussion in A/P where applicable  CT Chest Wo Contrast  Result Date: 05/09/2022 CLINICAL DATA:  Pneumonia. EXAM: CT CHEST WITHOUT CONTRAST TECHNIQUE: Multidetector CT imaging of the chest was performed following the standard protocol without IV contrast. RADIATION DOSE REDUCTION: This exam was performed according to the departmental dose-optimization program which includes automated exposure control, adjustment of the mA and/or kV according to patient size and/or use of iterative reconstruction technique. COMPARISON:  11/24/2021 FINDINGS: Cardiovascular: Heart is enlarged. Coronary artery calcification is evident. Moderate atherosclerotic calcification is noted in the wall of the thoracic aorta. Focal dissection, small saccular aneurysm stable appearance or calcified penetrating ulcer/ulcerated plaque in the descending thoracic aorta is stable. Ascending thoracic aorta measures 4.1 cm diameter. Mediastinum/Nodes: Scattered upper normal mediastinal lymph nodes again noted. No evidence for gross hilar lymphadenopathy although assessment is limited by the lack of intravenous contrast on the current study. The esophagus has normal imaging features. Food debris in  the esophagus may be related to dysmotility or reflux. Similar appearance small axillary lymph nodes bilaterally. Lungs/Pleura: Centrilobular emphsyema noted. Interval development of right lower lobe collapse/consolidation with moderate to large right pleural effusion. Patchy airspace disease noted left base with small left pleural effusion. Upper Abdomen: Probable left hepatic cyst. Calcified granulomata noted in the spleen. Musculoskeletal: No worrisome lytic or sclerotic osseous abnormality. IMPRESSION: 1. Interval development of right lower lobe collapse/consolidation with moderate to large right pleural effusion. 2. Patchy airspace disease left base with small left pleural effusion. 3. Of the thoracic aortic aneurysm measuring up to 4.1 cm diameter in the ascending segment. Recommend annual imaging followup by CTA or MRA. This recommendation follows 2010 ACCF/AHA/AATS/ACR/ASA/SCA/SCAI/SIR/STS/SVM Guidelines for the Diagnosis and Management of Patients with Thoracic Aortic Disease. Circulation. 2010; 121: Z610-R604. Aortic aneurysm NOS (ICD10-I71.9) . 4. Aortic Atherosclerosis (ICD10-I70.0) and Emphysema (ICD10-J43.9). Electronically Signed   By: Misty Stanley M.D.   On: 05/09/2022 16:50   CT HEAD WO CONTRAST (5MM)  Result Date: 05/09/2022 CLINICAL DATA:  Mental status changes. EXAM: CT HEAD WITHOUT CONTRAST TECHNIQUE: Contiguous axial images were obtained from the base of the skull through the vertex without intravenous contrast. RADIATION DOSE REDUCTION: This exam was performed according to the departmental dose-optimization program which includes automated exposure control, adjustment of the mA and/or kV according to patient size and/or use of iterative reconstruction technique. COMPARISON:  None. FINDINGS: Brain: There is no evidence for acute hemorrhage, hydrocephalus, mass lesion, or abnormal extra-axial fluid collection. No definite CT evidence for acute infarction. Diffuse loss of parenchymal volume  is consistent with atrophy. Patchy low attenuation in the deep hemispheric and periventricular white matter is nonspecific, but likely reflects chronic microvascular ischemic demyelination. Age indeterminate lacunar infarcts seen in the right basal ganglia. Vascular: No hyperdense vessel or unexpected calcification. Skull: No evidence for fracture. No worrisome lytic or sclerotic lesion. Sinuses/Orbits: Chronic mucosal thickening noted left maxillary sinus. Visualized portions of the globes and intraorbital fat are unremarkable. Other: None. IMPRESSION: 1. No acute intracranial abnormality. 2. Age-indeterminate lacunar infarcts in the right basal ganglia. 3.  Atrophy with chronic small vessel ischemic disease. Electronically Signed   By: Misty Stanley M.D.   On: 05/09/2022 16:45   DG Chest Port 1 View  Result Date: 05/09/2022 CLINICAL DATA:  Pneumonia.  Wheezing. EXAM: PORTABLE CHEST 1 VIEW COMPARISON:  May 01, 2022 FINDINGS: Stable cardiomegaly. The hila and mediastinum are normal. No pneumothorax. Bilateral pleural effusions, right greater than left, more pronounced in the interval. Opacities are identified in the mid lower lungs. No other acute abnormalities. IMPRESSION: 1. Cardiomegaly and pleural effusions. The pleural effusions are larger in the interval and larger on the right than the left. Opacities underlying the effusions may represent atelectasis. 2. Developing pulmonary edema not excluded. Electronically Signed   By: Dorise Bullion III M.D.   On: 05/09/2022 13:38    EKG: Independently reviewed.  Atrial fib with rate 74; nonspecific ST changes with no evidence of acute ischemia   Labs on Admission: I have personally reviewed the available labs and imaging studies at the time of the admission.  Pertinent labs:    hgb: 8.8,  BUN: 40,  creatinine: 2.19,  BNP: 589,  troponin 25>23,   Assessment and Plan: Principal Problem:   Acute on chronic diastolic CHF (congestive heart failure)  (HCC) Active Problems:   Pleural effusion on right   Right lower lobe consolidation (HCC)   CKD (chronic kidney disease) stage 4, GFR 15-29 ml/min (HCC)   Left inguinal hernia   Paroxysmal atrial fibrillation (HCC)   Essential hypertension   Descending thoracic aortic aneurysm (HCC)   PAD (peripheral artery disease) (HCC)   Hyperlipidemia   Hypothyroidism   Chronic anemia   Sacral ulcer (HCC)    Assessment and Plan: * Acute on chronic diastolic CHF (congestive heart failure) ([place]) 88 year old male presenting with week long history of progressively worse shortness of breath found to have increased LE edema, elevated BNP and a large right pleural effusion on xray with elevated JVP on exam consistent with acute on chronic diastolic CHF exacerbation -admit to med telemetry -likely secondary to recent hospitalization with IVF and holding lasix due to acute on chronic renal failure  -'40mg'$  IV lasix daily -strict I/O -echo (last echo in 2020 with normal, vigorous EF and indeterminate diastolic function )  -daily weights  -continue coreg   Pleural effusion on right Moderate to large pleural effusion with worsening shortness of breath Has been stable on room air, but if moves around a lot oxygen will drop to 88-89%.  He tells me has a history of needing x3 thoracentesis done then had to have a surgery, he was not living here and can't recall why he had the fluid in his lungs  Will likely need IR vs. pulm to drain this for therapeutic/diagnostic reasons, but favor CHF exacerbation.  Repeat 2V CXR in AM   Right lower lobe consolidation (HCC) Pneumonia vs. Atelectasis, hard to differentiate with large effusion He has no cough, fever, leukocytosis, check PCT Continue broad spectrum for HCAP xoponex prn  Repeat 2v CXR in AM   Left inguinal hernia Known left inguinal hernia from CT abdomen in 2021.  Not reproducible with new pain  Ct abdomen/pelvis without contrast   CKD (chronic kidney  disease) stage 4, GFR 15-29 ml/min (HCC) Baseline creatinine around 2.5, stable Monitor with IV diuresis   Paroxysmal atrial fibrillation (HCC)  Has been off of warfarin for ~1 month d/t severe recurrent epistaxis  - Continue digoxin and coreg, - continue cardiology follow-up as planned to discuss  possible resumption of anticoagulation    Essential hypertension Continue coreg 6.'125mg'$  BID   Descending thoracic aortic aneurysm (HCC) measuring up to 4.1 cm in diameter in the ascending segment.  surveillance with cardiothoracic surgery   PAD (peripheral artery disease) ([place]) At recent hospitalization he underwent LLE angiogram which showed occlusion of anterior and posterior tibial arteries Continue ASA/statin and monitoring for bleeding with hx of epistaxis.  -some tenderness in left lower leg that feels like phlebitis. On evidence of infection. If continues would have vascular stop by  - LDL 42 from lipid panel 8/17 - follow up with [facility] in 1 month and podiatry   Hyperlipidemia Continue lipitor   Hypothyroidism TSH wnl in 2022 Repeat, continue home synthroid   Chronic anemia Stable, continue oral iron   Sacral ulcer ([place]) Chronic appearing Wound consult     Advance Care Planning:   Code Status: DNR   Consults: wound care   DVT Prophylaxis: lovenox   Family Communication: daughter at bedside: Julious Payer   Severity of Illness: The appropriate patient status for this patient is INPATIENT. Inpatient status is judged to be reasonable and necessary in order to provide the required intensity of service to ensure the patient's safety. The patient's presenting symptoms, physical exam findings, and initial radiographic and laboratory data in the context of their chronic comorbidities is felt to place them at high risk for further clinical deterioration. Furthermore, it is not anticipated that the patient will be medically stable for discharge from the hospital within 2  midnights of admission.   * I certify that at the point of admission it is my clinical judgment that the patient will require inpatient hospital care spanning beyond 2 midnights from the point of admission due to high intensity of service, high risk for further deterioration and high frequency of surveillance required.*  Author: Orma Flaming, MD 05/09/2022 8:55 PM  For on call review [email or web address].

## 2022-05-09 NOTE — ED Triage Notes (Signed)
Pt arrived via [facility] from [facility] facility for PNA, no O2 at baseline but EMS put on 3LPM [facility] for saturation of 92% on RA. Pt 97% on RA during triage but audibly wheezing. Per EMS, daughter wanted pt sent her d/t not feeling like facility was doing enough for PNA. Per facility chart, pt on oral keflex and azithromycin

## 2022-05-09 NOTE — Assessment & Plan Note (Addendum)
Has been off of warfarin for ~1 month d/t severerecurrentepistaxis -Continue digoxin and coreg, - continue cardiology follow-up as planned to discuss possible resumption of anticoagulation

## 2022-05-09 NOTE — Assessment & Plan Note (Deleted)
88 year old male presenting with week long history of progressively worse shortness of breath found to have increased LE edema, elevated BNP and a large right pleural effusion on xray with elevated JVP on exam consistent with acute on chronic diastolic CHF exacerbation -admit to med telemetry -likely secondary to recent hospitalization with IVF and holding lasix due to acute on chronic renal failure  -'40mg'$  IV lasix daily -strict I/O -echo (last echo in 2020 with normal, vigorous EF and indeterminate diastolic function )  -daily weights  -continue coreg

## 2022-05-09 NOTE — Assessment & Plan Note (Signed)
Pneumonia vs. Atelectasis, hard to differentiate with large effusion He has no cough, fever, leukocytosis, check PCT Continue broad spectrum for HCAP xoponex prn  Repeat 2v CXR in AM

## 2022-05-09 NOTE — Assessment & Plan Note (Addendum)
Known left inguinal hernia from CT abdomen in 2021.  Not reproducible with new pain  Ct abdomen/pelvis without contrast

## 2022-05-09 NOTE — Assessment & Plan Note (Addendum)
Continue with statin therapy.  ?

## 2022-05-09 NOTE — Assessment & Plan Note (Addendum)
At recent hospitalization he underwent LLE angiogram which showed occlusion of anterior and posterior tibial arteries  Plan to continue with aspirin, continue with statin therapy.

## 2022-05-09 NOTE — Assessment & Plan Note (Addendum)
measuring up to 4.1 cm in diameter in the ascending segment.  surveillance with cardiothoracic surgery

## 2022-05-09 NOTE — Progress Notes (Addendum)
Pharmacy Antibiotic Note  Steven Williams is a 88 y.o. male admitted on 05/09/2022 with pneumonia.  Pharmacy has been consulted for vancomycin  -WBC= 7.4, afebrile -SCr= 2.19 (BL ~ 2.1-2.5), CrCL ~ 25  Addendum  Due to his age and CKD, ok to change vanc to linezolid with cefepime per Dr. Rogers Blocker.  Plan: Dc vanc Linezolid '600mg'$  IV q12 Cefepime 2g IV q24   Body mass index is 25.32 kg/m.   Height: '6\' 2"'$  (188 cm) Weight: 89.4 kg (197 lb 3.2 oz) IBW/kg (Calculated) : 82.2  Temp (24hrs), Avg:96.8 F (36 C), Min:96.8 F (36 C), Max:96.8 F (36 C)  Recent Labs  Lab 05/09/22 1330  WBC 7.4  CREATININE 2.19*    Estimated Creatinine Clearance: 26.1 mL/min (A) (by C-G formula based on SCr of 2.19 mg/dL (H)).    Allergies  Allergen Reactions   Iodinated Contrast Media Rash   Onnie Boer, PharmD, BCIDP, AAHIVP, CPP Infectious Disease Pharmacist 05/09/2022 7:27 PM

## 2022-05-09 NOTE — Assessment & Plan Note (Signed)
TSH wnl in 2022 Repeat, continue home synthroid

## 2022-05-09 NOTE — ED Provider Notes (Signed)
[facility] EMERGENCY DEPARTMENT Provider Note   CSN: [id number] Arrival date & time: 05/09/22  1206     History  Chief Complaint  Patient presents with   Pneumonia    Steven Williams is a 88 y.o. male.   Pneumonia     88 year old male with medical history significant for CKD, DNR in place, CAD, descending thoracic aortic aneurysm, HTN, HLD, paroxysmal atrial fibrillation not on anticoagulation due to recurrent epistaxis, TIA, CHF who presents to the emergency department with shortness of breath.  The patient a recent admission in August from 04/18/2022 to 04/27/2022 for cellulitis of the left lower extremity.  He was seen in the emergency department on 05/01/2022 following discharge who presented with concern for bruising over his abdomen.  He had reported some shortness of breath at times but this was not new.  His oxygen saturation was normal.  He had some edema in his legs with effusions in his lungs bilaterally seen on chest x-ray which was thought to be due to fluid resuscitation while inpatient undergoing treatment for cellulitis.  He was discharged to a rehab facility and presents from [place] with a diagnosis of pneumonia.  Not on oxygen at baseline but the patient reportedly was saturating 92% on room air.  Audible wheezing present with EMS and the patient was placed on 3 L nasal cannula for hypoxia.  Per EMS, the patient's daughter wanted her father to be sent due to feeling like the patient was not receiving enough treatment for pneumonia.  Patient has been on oral Keflex and azithromycin.  Feels persistently weak and short of breath despite being treated with oral antibiotics.  Per his daughter, Julious Payer, he has been staying at [facility] for the past week. Had cellulitis, and needed rehab. Has been short of breath. Had been taking too many opiates for pain. Recently Oxycodone Dc'd. Has been fatigued and weak/lethargic. A CXR had diagnosed PNA last Friday  morning. Started antibiotics yesterday morning. Took Lasix for 4 days after being seen in the ED on 8/25. Has not been on Lasix since then although it had been a daily medication previously. His Lasix which he is normally on for diastolic CHF had been held at discharge after he had been treated for cellulitis. No fevers or chills. Denies cough.    Home Medications Prior to Admission medications   Medication Sig Start Date End Date Taking? Authorizing Provider  acetaminophen (TYLENOL) 500 MG tablet Take 2 tablets (1,000 mg total) by mouth every 6 (six) hours as needed. 04/27/22  Yes Dwyane Dee, MD  allopurinol (ZYLOPRIM) 100 MG tablet Take 0.5 tablets (50 mg total) by mouth every other day. 04/27/22  Yes Dwyane Dee, MD  aspirin EC 81 MG tablet Take 1 tablet (81 mg total) by mouth daily. Swallow whole. 04/28/22  Yes Dwyane Dee, MD  atorvastatin (LIPITOR) 40 MG tablet Take 1 tablet (40 mg total) by mouth daily. 04/28/22  Yes Dwyane Dee, MD  azithromycin (ZITHROMAX) 250 MG tablet Take 250 mg by mouth daily.   Yes [provider]  carvedilol (COREG) 6.25 MG tablet Take 6.25 mg by mouth 2 (two) times daily with a meal.   Yes [provider]  cephALEXin (KEFLEX) 500 MG capsule Take 500 mg by mouth 3 (three) times daily. For 10 days starting on 04/29/22   Yes [provider]  cholecalciferol (VITAMIN D3) 25 MCG (1000 UNIT) tablet Take 2,000 Units by mouth daily.   Yes [provider]  digoxin (  LANOXIN) 0.125 MG tablet Take 0.125 mg by mouth every Monday, Wednesday, and Friday.   Yes [provider]  Ferrous Sulfate (IRON) 325 (65 Fe) MG TABS Take 1 tablet (325 mg total) by mouth daily with breakfast. 04/27/22  Yes Dwyane Dee, MD  gabapentin (NEURONTIN) 300 MG capsule Take 1 capsule (300 mg total) by mouth 2 (two) times daily. 04/27/22  Yes Dwyane Dee, MD  hydrocortisone (ANUSOL-HC) 2.5 % rectal cream Apply 1 application topically 2 (two) times  daily as needed for hemorrhoids or anal itching. 04/15/21  Yes [provider]  LACTATED RINGERS IV Inject into the vein. 75 ml/1 hr over 12 hours   Yes [provider]  levothyroxine (SYNTHROID) 100 MCG tablet Take 100 mcg by mouth daily before breakfast. 06/04/20  Yes [provider]  Multiple Vitamins-Minerals (PRESERVISION AREDS PO) Take 1 capsule by mouth 2 (two) times daily.   Yes [provider]  pantoprazole (PROTONIX) 40 MG tablet Take 40 mg by mouth daily. 06/04/20  Yes [provider]  Polyvinyl Alcohol-Povidone (REFRESH OP) Place 1 drop into both eyes 4 (four) times daily.   Yes [provider]  rOPINIRole (REQUIP) 1 MG tablet Take 3 tablets (3 mg total) by mouth at bedtime. 04/27/22  Yes Dwyane Dee, MD  saccharomyces boulardii (FLORASTOR) 250 MG capsule Take 250 mg by mouth 2 (two) times daily. For 14 days starting 05/08/22   Yes [provider]  saw palmetto 500 MG capsule Take 500 mg by mouth daily.   Yes [provider]  senna-docusate (SENEXON-S) 8.6-50 MG tablet Take 1 tablet by mouth 2 (two) times daily.   Yes [provider]  sodium chloride (OCEAN) 0.65 % SOLN nasal spray Place 2 sprays into both nostrils every 4 (four) hours while awake. 01/11/22 05/09/22 Yes Pahwani, Einar Grad, MD  tamsulosin (FLOMAX) 0.4 MG CAPS capsule Take 0.4 mg by mouth daily.   Yes [provider]  traMADol (ULTRAM) 50 MG tablet Take 1 tablet (50 mg total) by mouth every 8 (eight) hours as needed for moderate pain. Patient taking differently: Take 50 mg by mouth every 6 (six) hours. 04/27/22  Yes Dwyane Dee, MD  vitamin B-12 (CYANOCOBALAMIN) 1000 MCG tablet Take 1 tablet (1,000 mcg total) by mouth daily. 07/09/21  Yes Orson Slick, MD  Cholecalciferol (VITAMIN D3) 50 MCG (2000 UT) TABS Take 2,000 Units by mouth daily. Patient not taking: Reported on 05/09/2022    [provider]  doxycycline (VIBRAMYCIN) 100 MG  capsule Take 100 mg by mouth 2 (two) times daily. Patient not taking: Reported on 05/09/2022    [provider]  furosemide (LASIX) 20 MG tablet Take 1 tablet (20 mg total) by mouth daily for 4 days. 05/01/22 05/05/22  Blanchie Dessert, MD  oxyCODONE-acetaminophen (PERCOCET/ROXICET) 5-325 MG tablet Take by mouth every 4 (four) hours as needed for severe pain. Patient not taking: Reported on 05/09/2022    [provider]  senna-docusate (SENOKOT-S) 8.6-50 MG tablet Take 1 tablet by mouth 2 (two) times daily. Patient not taking: Reported on 05/09/2022 04/27/22   Dwyane Dee, MD      Allergies    Iodinated contrast media    Review of Systems   Review of Systems  All other systems reviewed and are negative.   Physical Exam Updated Vital Signs BP 133/81   Pulse (!) 56   Temp (!) 96.8 F (36 C) (Rectal) Comment: termostat in room turned up and 2 additional warm blankets placed  on pt  Resp 17   Ht '6\' 2"'$  (1.88 m)   Wt 89.4 kg   SpO2 97%   BMI 25.32 kg/m  Physical Exam Vitals and nursing note reviewed.  Constitutional:      General: He is not in acute distress.    Appearance: He is well-developed. He is ill-appearing.  HENT:     Head: Normocephalic and atraumatic.  Eyes:     Conjunctiva/sclera: Conjunctivae normal.  Cardiovascular:     Rate and Rhythm: Normal rate and regular rhythm.     Pulses: Normal pulses.  Pulmonary:     Effort: Pulmonary effort is normal. No respiratory distress.     Breath sounds: Wheezing and rales present.  Abdominal:     Palpations: Abdomen is soft.     Tenderness: There is no abdominal tenderness.  Musculoskeletal:        General: No swelling.     Cervical back: Neck supple.  Skin:    General: Skin is warm and dry.     Capillary Refill: Capillary refill takes less than 2 seconds.  Neurological:     Mental Status: He is alert.  Psychiatric:        Mood and Affect: Mood normal.     ED Results / Procedures / Treatments    Labs (all labs ordered are listed, but only abnormal results are displayed) Labs Reviewed  COMPREHENSIVE METABOLIC PANEL - Abnormal; Notable for the following components:      Result Value   Chloride 113 (*)    CO2 21 (*)    BUN 40 (*)    Creatinine, Ser 2.19 (*)    Total Protein 6.2 (*)    Albumin 2.8 (*)    Alkaline Phosphatase 158 (*)    GFR, Estimated 28 (*)    All other components within normal limits  CBC WITH DIFFERENTIAL/PLATELET - Abnormal; Notable for the following components:   RBC 2.67 (*)    Hemoglobin 8.8 (*)    HCT 28.6 (*)    MCV 107.1 (*)    All other components within normal limits  BRAIN NATRIURETIC PEPTIDE - Abnormal; Notable for the following components:   B Natriuretic Peptide 589.5 (*)    All other components within normal limits  URINALYSIS, ROUTINE W REFLEX MICROSCOPIC - Abnormal; Notable for the following components:   APPearance HAZY (*)    Leukocytes,Ua SMALL (*)    Bacteria, UA RARE (*)    All other components within normal limits  RAPID URINE DRUG SCREEN, HOSP PERFORMED - Abnormal; Notable for the following components:   Opiates POSITIVE (*)    All other components within normal limits  I-STAT VENOUS BLOOD GAS, ED - Abnormal; Notable for the following components:   pO2, Ven 74 (*)    Acid-base deficit 4.0 (*)    Calcium, Ion 1.42 (*)    HCT 26.0 (*)    Hemoglobin 8.8 (*)    All other components within normal limits  TROPONIN I (HIGH SENSITIVITY) - Abnormal; Notable for the following components:   Troponin I (High Sensitivity) 25 (*)    All other components within normal limits  TROPONIN I (HIGH SENSITIVITY) - Abnormal; Notable for the following components:   Troponin I (High Sensitivity) 23 (*)    All other components within normal limits  RESP PANEL BY RT-PCR (FLU A&B, COVID) [id number]  DIGOXIN LEVEL  AMMONIA    EKG EKG Interpretation  Date/Time:  Saturday May 09 2022 12:16:07 EDT Ventricular Rate:  64 PR  Interval:    QRS  Duration: 115 QT Interval:  335 QTC Calculation: 338 R Axis:   65 Text Interpretation: Atrial fibrillation Paired ventricular premature complexes Aberrant conduction of SV complex(es) Nonspecific intraventricular conduction delay Low voltage, precordial leads Borderline repolarization abnormality Confirmed by Regan Lemming ([id number]) on 05/09/2022 1:27:50 PM  Radiology CT Chest Wo Contrast  Result Date: 05/09/2022 CLINICAL DATA:  Pneumonia. EXAM: CT CHEST WITHOUT CONTRAST TECHNIQUE: Multidetector CT imaging of the chest was performed following the standard protocol without IV contrast. RADIATION DOSE REDUCTION: This exam was performed according to the departmental dose-optimization program which includes automated exposure control, adjustment of the mA and/or kV according to patient size and/or use of iterative reconstruction technique. COMPARISON:  11/24/2021 FINDINGS: Cardiovascular: Heart is enlarged. Coronary artery calcification is evident. Moderate atherosclerotic calcification is noted in the wall of the thoracic aorta. Focal dissection, small saccular aneurysm stable appearance or calcified penetrating ulcer/ulcerated plaque in the descending thoracic aorta is stable. Ascending thoracic aorta measures 4.1 cm diameter. Mediastinum/Nodes: Scattered upper normal mediastinal lymph nodes again noted. No evidence for gross hilar lymphadenopathy although assessment is limited by the lack of intravenous contrast on the current study. The esophagus has normal imaging features. Food debris in the esophagus may be related to dysmotility or reflux. Similar appearance small axillary lymph nodes bilaterally. Lungs/Pleura: Centrilobular emphsyema noted. Interval development of right lower lobe collapse/consolidation with moderate to large right pleural effusion. Patchy airspace disease noted left base with small left pleural effusion. Upper Abdomen: Probable left hepatic cyst. Calcified granulomata noted in the spleen.  Musculoskeletal: No worrisome lytic or sclerotic osseous abnormality. IMPRESSION: 1. Interval development of right lower lobe collapse/consolidation with moderate to large right pleural effusion. 2. Patchy airspace disease left base with small left pleural effusion. 3. Of the thoracic aortic aneurysm measuring up to 4.1 cm diameter in the ascending segment. Recommend annual imaging followup by CTA or MRA. This recommendation follows 2010 ACCF/AHA/AATS/ACR/ASA/SCA/SCAI/SIR/STS/SVM Guidelines for the Diagnosis and Management of Patients with Thoracic Aortic Disease. Circulation. 2010; 121: E993-Z169. Aortic aneurysm NOS (ICD10-I71.9) . 4. Aortic Atherosclerosis (ICD10-I70.0) and Emphysema (ICD10-J43.9). Electronically Signed   By: Misty Stanley M.D.   On: 05/09/2022 16:50   CT HEAD WO CONTRAST (5MM)  Result Date: 05/09/2022 CLINICAL DATA:  Mental status changes. EXAM: CT HEAD WITHOUT CONTRAST TECHNIQUE: Contiguous axial images were obtained from the base of the skull through the vertex without intravenous contrast. RADIATION DOSE REDUCTION: This exam was performed according to the departmental dose-optimization program which includes automated exposure control, adjustment of the mA and/or kV according to patient size and/or use of iterative reconstruction technique. COMPARISON:  None. FINDINGS: Brain: There is no evidence for acute hemorrhage, hydrocephalus, mass lesion, or abnormal extra-axial fluid collection. No definite CT evidence for acute infarction. Diffuse loss of parenchymal volume is consistent with atrophy. Patchy low attenuation in the deep hemispheric and periventricular white matter is nonspecific, but likely reflects chronic microvascular ischemic demyelination. Age indeterminate lacunar infarcts seen in the right basal ganglia. Vascular: No hyperdense vessel or unexpected calcification. Skull: No evidence for fracture. No worrisome lytic or sclerotic lesion. Sinuses/Orbits: Chronic mucosal  thickening noted left maxillary sinus. Visualized portions of the globes and intraorbital fat are unremarkable. Other: None. IMPRESSION: 1. No acute intracranial abnormality. 2. Age-indeterminate lacunar infarcts in the right basal ganglia. 3. Atrophy with chronic small vessel ischemic disease. Electronically Signed   By: Misty Stanley M.D.   On: 05/09/2022 16:45   DG Chest Port 1 View  Result  Date: 05/09/2022 CLINICAL DATA:  Pneumonia.  Wheezing. EXAM: PORTABLE CHEST 1 VIEW COMPARISON:  May 01, 2022 FINDINGS: Stable cardiomegaly. The hila and mediastinum are normal. No pneumothorax. Bilateral pleural effusions, right greater than left, more pronounced in the interval. Opacities are identified in the mid lower lungs. No other acute abnormalities. IMPRESSION: 1. Cardiomegaly and pleural effusions. The pleural effusions are larger in the interval and larger on the right than the left. Opacities underlying the effusions may represent atelectasis. 2. Developing pulmonary edema not excluded. Electronically Signed   By: Dorise Bullion III M.D.   On: 05/09/2022 13:38    Procedures Procedures    Medications Ordered in ED Medications  furosemide (LASIX) injection 40 mg (has no administration in time range)  ceFEPIme (MAXIPIME) 2 g in sodium chloride 0.9 % 100 mL IVPB (has no administration in time range)  vancomycin (VANCOREADY) IVPB 1750 mg/350 mL (has no administration in time range)  albuterol (VENTOLIN HFA) 108 (90 Base) MCG/ACT inhaler 1-2 puff (2 puffs Inhalation Given 05/09/22 1329)  cefTRIAXone (ROCEPHIN) 1 g in sodium chloride 0.9 % 100 mL IVPB (0 g Intravenous Stopped 05/09/22 1406)  doxycycline (VIBRA-TABS) tablet 100 mg (100 mg Oral Given 05/09/22 1330)    ED Course/ Medical Decision Making/ A&P                           Medical Decision Making Amount and/or Complexity of Data Reviewed Labs: ordered. Radiology: ordered.  Risk Prescription drug management.    88 year old male with  medical history significant for CKD, DNR in place, CAD, descending thoracic aortic aneurysm, HTN, HLD, paroxysmal atrial fibrillation not on anticoagulation due to recurrent epistaxis, TIA, CHF who presents to the emergency department with shortness of breath.  The patient a recent admission in August from 04/18/2022 to 04/27/2022 for cellulitis of the left lower extremity.  He was seen in the emergency department on 05/01/2022 following discharge who presented with concern for bruising over his abdomen.  He had reported some shortness of breath at times but this was not new.  His oxygen saturation was normal.  He had some edema in his legs with effusions in his lungs bilaterally seen on chest x-ray which was thought to be due to fluid resuscitation while inpatient undergoing treatment for cellulitis.  He was discharged to a rehab facility and presents from [place] with a diagnosis of pneumonia.  Not on oxygen at baseline but the patient reportedly was saturating 92% on room air.  Audible wheezing present with EMS and the patient was placed on 3 L nasal cannula for hypoxia.  Per EMS, the patient's daughter wanted her father to be sent due to feeling like the patient was not receiving enough treatment for pneumonia.  Patient has been on oral Keflex and azithromycin.  Feels persistently weak and short of breath despite being treated with oral antibiotics.  Per his daughter, Julious Payer, he has been staying at [facility] for the past week. Had cellulitis, and needed rehab. Has been short of breath. Had been taking too many opiates for pain. Recently Oxycodone Dc'd. Has been fatigued and weak/lethargic. A CXR had diagnosed PNA last Friday morning. Started antibiotics yesterday morning. Took Lasix for 4 days after being seen in the ED on 8/25. Has not been on Lasix since then although it had been a daily medication previously. His Lasix which he is normally on for diastolic CHF had been held at discharge after he had  been  treated for cellulitis. No fevers or chills. Denies cough.    On arrival, the patient was not tachycardic or tachypneic, saturating 96% on room air, hypertensive BP 155/64. Rectal temp 96.30F.  Patient presenting with increased dyspnea, profound fatigue, occasional desaturations noted at his facility.  Some confusion noted by the patient's daughter in the setting of his fatigue.  Patient arrived GCS 14, ABC intact.  Lungs with bilateral wheezing and bibasilar Rales with decreased breath sounds noted bibasilarly.  Differential diagnosis includes pneumonia, CHF exacerbation, viral infection, PE, ACS, sepsis.  Additionally considered electrolyte abnormality, anemia, UTI.  Consider polypharmacy causing the patient's confusion.  VBG was performed which revealed a pH of 7.28, PCO2 of 47, bicarbonate of 22.  UDS was positive for opiates, urinalysis with small leukocytes, rare bacteria.  A chest x-ray was performed and revealed the following: IMPRESSION:  1. Cardiomegaly and pleural effusions. The pleural effusions are  larger in the interval and larger on the right than the left.  Opacities underlying the effusions may represent atelectasis.  2. Developing pulmonary edema not excluded.   An EKG revealed atrial fibrillation, ventricular rate 6 4, no acute ischemic changes noted.  Imaging of the head revealed old lacunar infarcts, no acute intracranial abnormality: IMPRESSION:  1. Interval development of right lower lobe collapse/consolidation  with moderate to large right pleural effusion.  2. Patchy airspace disease left base with small left pleural  effusion.  3. Of the thoracic aortic aneurysm measuring up to 4.1 cm diameter  in the ascending segment. Recommend annual imaging followup by CTA  or MRA. This recommendation follows 2010  ACCF/AHA/AATS/ACR/ASA/SCA/SCAI/SIR/STS/SVM Guidelines for the  Diagnosis and Management of Patients with Thoracic Aortic Disease.  Circulation. 2010; 121:  U132-G401. Aortic aneurysm NOS (ICD10-I71.9)    For evaluation significant for CBC without a leukocytosis, stable anemia to 8.8, BMP elevated to 589, no prior laboratory BNPs for comparison, COVID-19 influenza PCR testing negative, digoxin level normal at 0.8, initial troponin Elevated to 25, repeat troponin downtrending to 23, CMP with stable serum creatinine but elevated for the patient's CKD at 2.19, no other significant electrolyte abnormality.  Considered CHF and hypervolemia in the setting of the patient's pleural effusions and lack of outpatient Lasix use.  IV Lasix 40 mg was administered.  The patient was initially covered for CAP with broad-spectrum antibiotics however, given his recent hospitalization, will broaden the patient out with vancomycin and cefepime.  CT imaging of the chest was performed and results are as follows: IMPRESSION:  1. Interval development of right lower lobe collapse/consolidation  with moderate to large right pleural effusion.  2. Patchy airspace disease left base with small left pleural  effusion.  3. Of the thoracic aortic aneurysm measuring up to 4.1 cm diameter  in the ascending segment. Recommend annual imaging followup by CTA  or MRA. This recommendation follows 2010  ACCF/AHA/AATS/ACR/ASA/SCA/SCAI/SIR/STS/SVM Guidelines for the  Diagnosis and Management of Patients with Thoracic Aortic Disease.  Circulation. 2010; 121: U272-Z366. Aortic aneurysm NOS (ICD10-I71.9)   Given these findings, hospitalist medicine was consulted for admission.   Final Clinical Impression(s) / ED Diagnoses Final diagnoses:  Community acquired pneumonia of right lower lobe of lung  Pleural effusion  Bacteria in urine  Acute confusional state  Dyspnea, unspecified type  Anemia, unspecified type    Rx / DC Orders ED Discharge Orders     None         Regan Lemming, MD 05/09/22 1751

## 2022-05-09 NOTE — Assessment & Plan Note (Signed)
Stable, continue oral iron

## 2022-05-09 NOTE — Assessment & Plan Note (Addendum)
Continue with carvedilol  His blood pressure is more stable today, continue to hold on furosemide for now.

## 2022-05-09 NOTE — Assessment & Plan Note (Addendum)
Stage 4 CKD. Hypomagnesemia.   His hospitalization was prolonged due to acute kidney injury, furosemide was held and renal function is now improving. At the time of his discharge his renal function has a serum cr of 2.29 with K at 3,8 and serum bicarbonate at 24.   Electrolytes were corrected. Plan to resume furosemide on 09.09.23, with 40 mg daily, increase to bid in case of volume overload.

## 2022-05-09 NOTE — ED Notes (Signed)
This nurse, when changing and turning patient, noticed multiple pressure injuries that were present upon arrival. Pt has nonblanachable place to coccyx with small open area. Pt has draining ulcer on L heel. Pt also has multiple dressings on body placed by facility

## 2022-05-10 ENCOUNTER — Inpatient Hospital Stay (HOSPITAL_COMMUNITY): Payer: Medicare Other

## 2022-05-10 DIAGNOSIS — I5033 Acute on chronic diastolic (congestive) heart failure: Secondary | ICD-10-CM | POA: Diagnosis not present

## 2022-05-10 DIAGNOSIS — L03116 Cellulitis of left lower limb: Secondary | ICD-10-CM | POA: Diagnosis not present

## 2022-05-10 DIAGNOSIS — I5031 Acute diastolic (congestive) heart failure: Secondary | ICD-10-CM

## 2022-05-10 LAB — ECHOCARDIOGRAM COMPLETE
AR max vel: 1.28 cm2
AV Area VTI: 1.19 cm2
AV Area mean vel: 1.13 cm2
AV Mean grad: 10.5 mmHg
AV Peak grad: 20.6 mmHg
Ao pk vel: 2.27 m/s
Height: 74 in
MV M vel: 4.74 m/s
MV Peak grad: 89.9 mmHg
MV VTI: 1.8 cm2
Radius: 0.6 cm
S' Lateral: 3.7 cm
Weight: 3030 oz

## 2022-05-10 LAB — BODY FLUID CELL COUNT WITH DIFFERENTIAL
Eos, Fluid: 1 %
Lymphs, Fluid: 53 %
Monocyte-Macrophage-Serous Fluid: 18 % — ABNORMAL LOW (ref 50–90)
Neutrophil Count, Fluid: 26 % — ABNORMAL HIGH (ref 0–25)
Total Nucleated Cell Count, Fluid: 377 cu mm (ref 0–1000)

## 2022-05-10 LAB — ALBUMIN, PLEURAL OR PERITONEAL FLUID: Albumin, Fluid: <1.5 g/dL

## 2022-05-10 LAB — GRAM STAIN

## 2022-05-10 LAB — COMPREHENSIVE METABOLIC PANEL
ALT: 12 U/L (ref 0–44)
AST: 16 U/L (ref 15–41)
Albumin: 2.6 g/dL — ABNORMAL LOW (ref 3.5–5.0)
Alkaline Phosphatase: 146 U/L — ABNORMAL HIGH (ref 38–126)
Anion gap: 9 (ref 5–15)
BUN: 40 mg/dL — ABNORMAL HIGH (ref 8–23)
CO2: 19 mmol/L — ABNORMAL LOW (ref 22–32)
Calcium: 9.9 mg/dL (ref 8.9–10.3)
Chloride: 112 mmol/L — ABNORMAL HIGH (ref 98–111)
Creatinine, Ser: 2.12 mg/dL — ABNORMAL HIGH (ref 0.61–1.24)
GFR, Estimated: 29 mL/min — ABNORMAL LOW (ref >60)
Glucose, Bld: 90 mg/dL (ref 70–99)
Potassium: 4.3 mmol/L (ref 3.5–5.1)
Sodium: 140 mmol/L (ref 135–145)
Total Bilirubin: 0.6 mg/dL (ref 0.3–1.2)
Total Protein: 5.5 g/dL — ABNORMAL LOW (ref 6.5–8.1)

## 2022-05-10 LAB — CBC
HCT: 26.6 % — ABNORMAL LOW (ref 39.0–52.0)
Hemoglobin: 8.7 g/dL — ABNORMAL LOW (ref 13.0–17.0)
MCH: 33.7 pg (ref 26.0–34.0)
MCHC: 32.7 g/dL (ref 30.0–36.0)
MCV: 103.1 fL — ABNORMAL HIGH (ref 80.0–100.0)
Platelets: 171 10*3/uL (ref 150–400)
RBC: 2.58 MIL/uL — ABNORMAL LOW (ref 4.22–5.81)
RDW: 15 % (ref 11.5–15.5)
WBC: 8.2 10*3/uL (ref 4.0–10.5)
nRBC: 0 % (ref 0.0–0.2)

## 2022-05-10 LAB — PROTEIN, PLEURAL OR PERITONEAL FLUID: Total protein, fluid: <3 g/dL

## 2022-05-10 MED ORDER — LIDOCAINE HCL (PF) 1 % IJ SOLN
INTRAMUSCULAR | Status: AC
  Filled 2022-05-10: qty 30

## 2022-05-10 MED ORDER — LINEZOLID 600 MG/300ML IV SOLN
600.0000 mg | Freq: Two times a day (BID) | INTRAVENOUS | Status: DC
  Administered 2022-05-10: 600 mg via INTRAVENOUS
  Filled 2022-05-10 (×2): qty 300

## 2022-05-10 MED ORDER — FUROSEMIDE 10 MG/ML IJ SOLN
40.0000 mg | Freq: Two times a day (BID) | INTRAMUSCULAR | Status: DC
  Administered 2022-05-10 – 2022-05-13 (×7): 40 mg via INTRAVENOUS
  Filled 2022-05-10 (×7): qty 4

## 2022-05-10 NOTE — Consult Note (Signed)
[place] Nurse Consult Note: Reason for Consult:Bilateral heel pressure injuries, deep tissue pressure injuries (POA). Photodocumentation provided by Provider to EMR. Blanching erythema to sacrum. Wound type:Pressure Pressure Injury POA: Yes Measurement:See Nursing Flow Sheet Wound bed: purple/maroon discoloration consistent with deep tissue pressure injury Drainage (amount, consistency, odor) small (heels) Periwound: peeling epidermis, dry Dressing procedure/placement/frequency: Turning and repositioning is in place, a mattress replacement is provided to manage multiple risk factors. A sacral prophylactic foam is in place. Bilateral Pressure redistribution heel boots (Prevalon) are provided. Topical care guidance for Nursing for the care of the bilateral heel pressure injuries using daily soap and water cleanse, rinse and dry followed by placement of an antimicrobial nonadherent, Xeroform gauze, topped with dry gauze and secured with a few turns of Kerlix roll gauze/paper tape prior to placement of feet into pressure redistribution heel boots is provided.  If desired, evaluation and further input to the bilateral heel POC by Vascular or Orthopedics is recommended.  [place] nursing team will not follow, but will remain available to this patient, the nursing and medical teams.  Please re-consult if needed.  Thank you for inviting [place] to participate in this patient's Plan of Care.  Maudie Flakes, MSN, RN, CNS, [place], Serita Grammes, [employer], [employer] phone:  [phone]

## 2022-05-10 NOTE — Progress Notes (Signed)
PROGRESS NOTE    Steven Williams  TIR:[id number] DOB: 10-Feb-1930 DOA: 05/09/2022 PCP: Ginger Organ., MD  88 y.o. male symptomatic bradycardia with pacer, chronic diastolic CHF, CKD stage IV, chronic anemia and thrombocytopenia, thoracic aortic aneurysm followed by CT surgery, and paroxysmal atrial fibrillation off of anticoagulation for the past month due to severe epistaxis who presented to Ed with worsening shortness of breath.  -recently admitted 8/12-8/21 for cellulitis, ABIs were abnormal, vascular surgery consulted,  underwent an angiogram of LLE which revealed occluded anterior and posterior tibial arteries, and and small vessel disease in the foot and ankle not amenable to revascularization, medical management recommended. -At discharge his Lasix was held on account of AKI/CKD 4 -Back in the ED 9/2 with left leg pain, dyspnea, BNP 589, chest x-ray with bilateral pleural effusions, CT chest noted moderate to large right pleural effusion and patchy airspace disease in the left with small left pleural effusion  Subjective: -Complains of left leg pain, has an ulcer at the base of his left heel  Assessment and Plan:  Acute on chronic diastolic CHF (congestive heart failure) (HCC) Right pleural effusion -Complicated by AKI on CKD 4, diet Lasix held recent admission -Continue IV Lasix 40 Mg twice daily -Thoracentesis today -Last echo 2020 with preserved EF, indeterminate diastolic function -Clinically do not suspect pneumonia, this is likely atelectasis secondary to pleural effusion, discontinue antibiotics and monitor  PAD, left leg/foot pain Left heel with eschar, black discoloration -Known PAD, recently underwent an angiogram of LLE which revealed occluded anterior and posterior tibial arteries, and and small vessel disease in the foot and ankle not amenable to revascularization, medical management recommended -Will request podiatry input, continue wound care for now -Continue  aspirin, statin  Left inguinal hernia Known left inguinal hernia from CT abdomen in 2021.   CKD (chronic kidney disease) stage 4, GFR 15-29 ml/min (HCC) Baseline creatinine around 2.5, stable Monitor with IV diuresis   Paroxysmal atrial fibrillation (HCC)  Has been off of warfarin for ~1 month d/t severe recurrent epistaxis  - Continue digoxin and coreg, - continue cardiology follow-up as planned to discuss possible resumption of anticoagulation    Essential hypertension Continue coreg 6.'125mg'$  BID   Descending thoracic aortic aneurysm (HCC) measuring up to 4.1 cm in diameter in the ascending segment.  surveillance with cardiothoracic surgery   Hyperlipidemia Continue lipitor   Hypothyroidism TSH wnl in 2022 Repeat, continue home synthroid   Chronic anemia Stable, continue oral iron   Sacral ulcer ([place]) Chronic appearing Wound consult   DVT prophylaxis: Add Lovenox Code Status: DNR Family Communication: No family at bedside, called and updated daughter Stanton [facility] Disposition Plan: SNF when stable  Consultants: Podiatry   Procedures:   Antimicrobials:    Objective: Vitals:   05/10/22 0336 05/10/22 0858 05/10/22 0940 05/10/22 0955  BP: 99/68 91/60 (!) 109/51 (!) 97/46  Pulse: 73 63    Resp: 16 18    Temp: 97.7 F (36.5 C) (!) 97.4 F (36.3 C)    TempSrc: Oral Oral    SpO2: 95% 99%    Weight: 85.9 kg     Height:        Intake/Output Summary (Last 24 hours) at 05/10/2022 1117 Last data filed at 05/10/2022 0337 Gross per 24 hour  Intake 643 ml  Output 500 ml  Net 143 ml   Filed Weights   05/09/22 1219 05/10/22 0336  Weight: 89.4 kg 85.9 kg    Examination:  General exam: Chronically ill elderly  male sitting up in bed, somnolent, oriented to self and place, cognitive deficits HEENT: Positive JVD CVS: S1-S2, regular rhythm Lungs: Decreased breath sounds both bases Abdomen: Soft, nontender, bowel sounds present Extremities: Trace edema, left foot with  mild erythema, ulcer at the base of left heel, black eschar Psychiatry: Flat affect    Data Reviewed:   CBC: Recent Labs  Lab 05/09/22 1330 05/09/22 1713 05/10/22 0237  WBC 7.4  --  8.2  NEUTROABS 5.7  --   --   HGB 8.8* 8.8* 8.7*  HCT 28.6* 26.0* 26.6*  MCV 107.1*  --  103.1*  PLT 188  --  627   Basic Metabolic Panel: Recent Labs  Lab 05/09/22 1330 05/09/22 1624 05/09/22 1713 05/10/22 0237  NA 142  --  142 140  K 4.4  --  4.7 4.3  CL 113*  --   --  112*  CO2 21*  --   --  19*  GLUCOSE 85  --   --  90  BUN 40*  --   --  40*  CREATININE 2.19*  --   --  2.12*  CALCIUM 10.2  --   --  9.9  MG  --  2.0  --   --    GFR: Estimated Creatinine Clearance: 26.9 mL/min (A) (by C-G formula based on SCr of 2.12 mg/dL (H)). Liver Function Tests: Recent Labs  Lab 05/09/22 1330 05/10/22 0237  AST 17 16  ALT 14 12  ALKPHOS 158* 146*  BILITOT 0.9 0.6  PROT 6.2* 5.5*  ALBUMIN 2.8* 2.6*   No results for input(s): "LIPASE", "AMYLASE" in the last 168 hours. Recent Labs  Lab 05/09/22 1649  AMMONIA 21   Coagulation Profile: No results for input(s): "INR", "PROTIME" in the last 168 hours. Cardiac Enzymes: No results for input(s): "CKTOTAL", "CKMB", "CKMBINDEX", "TROPONINI" in the last 168 hours. BNP (last 3 results) No results for input(s): "PROBNP" in the last 8760 hours. HbA1C: No results for input(s): "HGBA1C" in the last 72 hours. CBG: No results for input(s): "GLUCAP" in the last 168 hours. Lipid Profile: No results for input(s): "CHOL", "HDL", "LDLCALC", "TRIG", "CHOLHDL", "LDLDIRECT" in the last 72 hours. Thyroid Function Tests: Recent Labs    05/09/22 1624  TSH 4.790*   Anemia Panel: No results for input(s): "VITAMINB12", "FOLATE", "FERRITIN", "TIBC", "IRON", "RETICCTPCT" in the last 72 hours. Urine analysis:    Component Value Date/Time   COLORURINE YELLOW 05/09/2022 1636   APPEARANCEUR HAZY (A) 05/09/2022 1636   LABSPEC 1.015 05/09/2022 1636    PHURINE 5.0 05/09/2022 1636   GLUCOSEU NEGATIVE 05/09/2022 1636   HGBUR NEGATIVE 05/09/2022 1636   BILIRUBINUR NEGATIVE 05/09/2022 1636   KETONESUR NEGATIVE 05/09/2022 1636   PROTEINUR NEGATIVE 05/09/2022 1636   NITRITE NEGATIVE 05/09/2022 1636   LEUKOCYTESUR SMALL (A) 05/09/2022 1636   Sepsis Labs: '@LABRCNTIP'$ (procalcitonin:4,lacticidven:4)  ) Recent Results (from the past 240 hour(s))  Resp Panel by RT-PCR (Flu A&B, Covid) Anterior Nasal Swab     Status: None   Collection Time: 05/09/22  1:30 PM   Specimen: Anterior Nasal Swab  Result Value Ref Range Status   SARS Coronavirus 2 by RT PCR NEGATIVE NEGATIVE Final    Comment: (NOTE) SARS-CoV-2 target nucleic acids are NOT DETECTED.  The SARS-CoV-2 RNA is generally detectable in upper respiratory specimens during the acute phase of infection. The lowest concentration of SARS-CoV-2 viral copies this assay can detect is 138 copies/mL. A negative result does not preclude SARS-Cov-2 infection and should not  be used as the sole basis for treatment or other patient management decisions. A negative result may occur with  improper specimen collection/handling, submission of specimen other than nasopharyngeal swab, presence of viral mutation(s) within the areas targeted by this assay, and inadequate number of viral copies(<138 copies/mL). A negative result must be combined with clinical observations, patient history, and epidemiological information. The expected result is Negative.  Fact Sheet for Patients:  EntrepreneurPulse.com.au  Fact Sheet for Healthcare Providers:  IncredibleEmployment.be  This test is no t yet approved or cleared by the [place] FDA and  has been authorized for detection and/or diagnosis of SARS-CoV-2 by FDA under an Emergency Use Authorization (EUA). This EUA will remain  in effect (meaning this test can be used) for the duration of the COVID-19 declaration under  Section 564(b)(1) of the Act, 21 U.S.C.section 360bbb-3(b)(1), unless the authorization is terminated  or revoked sooner.       Influenza A by PCR NEGATIVE NEGATIVE Final   Influenza B by PCR NEGATIVE NEGATIVE Final    Comment: (NOTE) The Xpert Xpress SARS-CoV-2/FLU/RSV plus assay is intended as an aid in the diagnosis of influenza from Nasopharyngeal swab specimens and should not be used as a sole basis for treatment. Nasal washings and aspirates are unacceptable for Xpert Xpress SARS-CoV-2/FLU/RSV testing.  Fact Sheet for Patients: EntrepreneurPulse.com.au  Fact Sheet for Healthcare Providers: IncredibleEmployment.be  This test is not yet approved or cleared by the [place] FDA and has been authorized for detection and/or diagnosis of SARS-CoV-2 by FDA under an Emergency Use Authorization (EUA). This EUA will remain in effect (meaning this test can be used) for the duration of the COVID-19 declaration under Section 564(b)(1) of the Act, 21 U.S.C. section 360bbb-3(b)(1), unless the authorization is terminated or revoked.  Performed at [facility], [place] [address], [place], [place] [address]      Radiology Studies: US THORACENTESIS [place] PLEURAL SPACE W/IMG GUIDE  Result Date: 05/10/2022 INDICATION: Pleural effusion, SOB EXAM: ULTRASOUND GUIDED RIGHT THORACENTESIS MEDICATIONS: None. COMPLICATIONS: None immediate. PROCEDURE: An ultrasound guided thoracentesis was thoroughly discussed with the patient and questions answered. The benefits, risks, alternatives and complications were also discussed. The patient understands and wishes to proceed with the procedure. Written consent was obtained. Ultrasound was performed to localize and mark an adequate pocket of fluid in the right chest. The area was then prepped and draped in the normal sterile fashion. 1% Lidocaine was used for local anesthesia. Under ultrasound guidance a 6 Fr  Safe-T-Centesis catheter was introduced. Thoracentesis was performed. The catheter was removed and a dressing applied. FINDINGS: A total of approximately 1.2L of pleural fluid was removed. Samples were sent to the laboratory as requested by the clinical team. IMPRESSION: Successful ultrasound guided right thoracentesis yielding 1.2L of pleural fluid. Read and performed by: Alexandria Lodge, PA-C Electronically Signed   By: Sandi Mariscal M.D.   On: 05/10/2022 10:31   DG CHEST PORT 1 VIEW  Result Date: 05/10/2022 CLINICAL DATA:  Status post thoracentesis EXAM: PORTABLE CHEST 1 VIEW COMPARISON:  Radiograph 05/10/2022 FINDINGS: Unchanged enlarged cardiac silhouette with dual chamber pacemaker leads. Decreased right pleural effusion with trace residual fluid and basilar atelectasis. Stable small left pleural effusion. Improving pulmonary edema with persistent lower lung airspace disease and small left pleural effusion. No pneumothorax. No acute osseous abnormality. IMPRESSION: Decreased right pleural effusion after thoracentesis. No pneumothorax. Improving pulmonary edema with persistent lower lung airspace disease and small left pleural effusion. Electronically Signed  By: Maurine Simmering M.D.   On: 05/10/2022 10:20   DG Chest 2 View  Result Date: 05/10/2022 CLINICAL DATA:  Follow-up pneumonia. EXAM: CHEST - 2 VIEW COMPARISON:  05/09/2022 and older studies. FINDINGS: Cardiac silhouette is enlarged, stable. Bilateral mid and lower lung zone airspace opacities are without significant change from the previous day's exam. Moderate bilateral pleural effusions, also stable. No pneumothorax. IMPRESSION: 1. No significant change from the previous day's exam. 2. Cardiomegaly, airspace lung opacities and bilateral effusions suggests congestive heart failure. Electronically Signed   By: Lajean Manes M.D.   On: 05/10/2022 08:00   CT Chest Wo Contrast  Result Date: 05/09/2022 CLINICAL DATA:  Pneumonia. EXAM: CT CHEST WITHOUT  CONTRAST TECHNIQUE: Multidetector CT imaging of the chest was performed following the standard protocol without IV contrast. RADIATION DOSE REDUCTION: This exam was performed according to the departmental dose-optimization program which includes automated exposure control, adjustment of the mA and/or kV according to patient size and/or use of iterative reconstruction technique. COMPARISON:  11/24/2021 FINDINGS: Cardiovascular: Heart is enlarged. Coronary artery calcification is evident. Moderate atherosclerotic calcification is noted in the wall of the thoracic aorta. Focal dissection, small saccular aneurysm stable appearance or calcified penetrating ulcer/ulcerated plaque in the descending thoracic aorta is stable. Ascending thoracic aorta measures 4.1 cm diameter. Mediastinum/Nodes: Scattered upper normal mediastinal lymph nodes again noted. No evidence for gross hilar lymphadenopathy although assessment is limited by the lack of intravenous contrast on the current study. The esophagus has normal imaging features. Food debris in the esophagus may be related to dysmotility or reflux. Similar appearance small axillary lymph nodes bilaterally. Lungs/Pleura: Centrilobular emphsyema noted. Interval development of right lower lobe collapse/consolidation with moderate to large right pleural effusion. Patchy airspace disease noted left base with small left pleural effusion. Upper Abdomen: Probable left hepatic cyst. Calcified granulomata noted in the spleen. Musculoskeletal: No worrisome lytic or sclerotic osseous abnormality. IMPRESSION: 1. Interval development of right lower lobe collapse/consolidation with moderate to large right pleural effusion. 2. Patchy airspace disease left base with small left pleural effusion. 3. Of the thoracic aortic aneurysm measuring up to 4.1 cm diameter in the ascending segment. Recommend annual imaging followup by CTA or MRA. This recommendation follows 2010  ACCF/AHA/AATS/ACR/ASA/SCA/SCAI/SIR/STS/SVM Guidelines for the Diagnosis and Management of Patients with Thoracic Aortic Disease. Circulation. 2010; 121: M578-I696. Aortic aneurysm NOS (ICD10-I71.9) . 4. Aortic Atherosclerosis (ICD10-I70.0) and Emphysema (ICD10-J43.9). Electronically Signed   By: Misty Stanley M.D.   On: 05/09/2022 16:50   CT HEAD WO CONTRAST (5MM)  Result Date: 05/09/2022 CLINICAL DATA:  Mental status changes. EXAM: CT HEAD WITHOUT CONTRAST TECHNIQUE: Contiguous axial images were obtained from the base of the skull through the vertex without intravenous contrast. RADIATION DOSE REDUCTION: This exam was performed according to the departmental dose-optimization program which includes automated exposure control, adjustment of the mA and/or kV according to patient size and/or use of iterative reconstruction technique. COMPARISON:  None. FINDINGS: Brain: There is no evidence for acute hemorrhage, hydrocephalus, mass lesion, or abnormal extra-axial fluid collection. No definite CT evidence for acute infarction. Diffuse loss of parenchymal volume is consistent with atrophy. Patchy low attenuation in the deep hemispheric and periventricular white matter is nonspecific, but likely reflects chronic microvascular ischemic demyelination. Age indeterminate lacunar infarcts seen in the right basal ganglia. Vascular: No hyperdense vessel or unexpected calcification. Skull: No evidence for fracture. No worrisome lytic or sclerotic lesion. Sinuses/Orbits: Chronic mucosal thickening noted left maxillary sinus. Visualized portions of the globes and intraorbital fat  are unremarkable. Other: None. IMPRESSION: 1. No acute intracranial abnormality. 2. Age-indeterminate lacunar infarcts in the right basal ganglia. 3. Atrophy with chronic small vessel ischemic disease. Electronically Signed   By: Misty Stanley M.D.   On: 05/09/2022 16:45   DG Chest Port 1 View  Result Date: 05/09/2022 CLINICAL DATA:  Pneumonia.   Wheezing. EXAM: PORTABLE CHEST 1 VIEW COMPARISON:  May 01, 2022 FINDINGS: Stable cardiomegaly. The hila and mediastinum are normal. No pneumothorax. Bilateral pleural effusions, right greater than left, more pronounced in the interval. Opacities are identified in the mid lower lungs. No other acute abnormalities. IMPRESSION: 1. Cardiomegaly and pleural effusions. The pleural effusions are larger in the interval and larger on the right than the left. Opacities underlying the effusions may represent atelectasis. 2. Developing pulmonary edema not excluded. Electronically Signed   By: Dorise Bullion III M.D.   On: 05/09/2022 13:38     Scheduled Meds:  [START ON 05/11/2022] allopurinol  50 mg Oral QODAY   aspirin EC  81 mg Oral Daily   atorvastatin  40 mg Oral Daily   carvedilol  6.25 mg Oral BID WC   [START ON 05/11/2022] digoxin  0.125 mg Oral Q M,W,F   enoxaparin (LOVENOX) injection  30 mg Subcutaneous Q24H   ferrous sulfate  325 mg Oral Q breakfast   furosemide  40 mg Intravenous BID   gabapentin  300 mg Oral BID   Gerhardt's butt cream   Topical BID   levothyroxine  100 mcg Oral QAC breakfast   multivitamin  1 tablet Oral BID   pantoprazole  40 mg Oral Daily   polyvinyl alcohol  1 drop Both Eyes QID   rOPINIRole  3 mg Oral QHS   saccharomyces boulardii  250 mg Oral BID   senna-docusate  1 tablet Oral BID   sodium chloride  2 spray Each Nare Q4H while awake   sodium chloride flush  3 mL Intravenous Q12H   tamsulosin  0.4 mg Oral Daily   Continuous Infusions:  sodium chloride     ceFEPime (MAXIPIME) IV     linezolid (ZYVOX) IV       LOS: 1 day    Time spent: 56mn   PDomenic Polite MD [facility]   05/10/2022, 11:17 AM

## 2022-05-10 NOTE — Consult Note (Signed)
Subjective:  Patient ID: Steven Williams, male    DOB: Mar 08, 1930,  MRN: [id number]  Patient with past medical history of Aortic aneurysm, CKD4, anemia, bradycardia, afib seen at beside today for concern of left heel wound.  Admitted for shortness of breath and upon admission heel woundsnoted. Previous admission for cellulitis and previous angiogram preformed that showed microvascular disease.  Podiatry consulted  for evaluation of wounds and care recommendations. Patient mildly alert upon examination unable to obtain much history.   Past Medical History:  Diagnosis Date   Antiphospholipid antibody positive    Aortic aneurysm (HCC)    thoracic, Dr Roxan Hockey, with h/o dissection   BPH (benign prostatic hyperplasia)    Central retinal artery occlusion    Chronic anemia    Chronic renal insufficiency    COPD (chronic obstructive pulmonary disease) (HCC)    DDD (degenerative disc disease), cervical    DJD (degenerative joint disease)    DVT (deep venous thrombosis) (HCC)    Gastric dysmotility    GERD (gastroesophageal reflux disease)    Gout    HTN (hypertension)    Hyperlipidemia    Hypertensive retinopathy    OU   Hypothyroid    Macular degeneration    Myelodysplasia (myelodysplastic syndrome) (HCC)    Paget's disease of bone    Peripheral neuropathy    Permanent atrial fibrillation (HCC)    Recurrent epistaxis 06/26/2020   Rheumatoid arthritis (HCC)    TIA (transient ischemic attack)      Past Surgical History:  Procedure Laterality Date   ABDOMINAL AORTOGRAM W/LOWER EXTREMITY Left 04/24/2022   Procedure: ABDOMINAL AORTOGRAM W/LOWER EXTREMITY;  Surgeon: Cherre Robins, MD;  Location: [facility];  Service: Cardiovascular;  Laterality: Left;   ATRIAL FIBRILLATION ABLATION  09/23/2014   Dr Encarnacion Chu in [place]     lumbar, x 2   CAROTID ENDARTERECTOMY Right 1990   CAROTID STENT     CATARACT EXTRACTION Bilateral 2010   [place]   EYE SURGERY Bilateral  2010   Cat Sx   LUNG DECORTICATION     VATS   PACEMAKER GENERATOR CHANGE  12/03/2011   MDT Adapta [id number] generator change by Dr Encarnacion Chu for symptomatic bradycardia   PACEMAKER IMPLANT  2004   MDT        Latest Ref Rng & Units 05/10/2022    2:37 AM 05/09/2022    5:13 PM 05/09/2022    1:30 PM  CBC  WBC 4.0 - 10.5 K/uL 8.2   7.4   Hemoglobin 13.0 - 17.0 g/dL 8.7  8.8  8.8   Hematocrit 39.0 - 52.0 % 26.6  26.0  28.6   Platelets 150 - 400 K/uL 171   188        Latest Ref Rng & Units 05/10/2022    2:37 AM 05/09/2022    5:13 PM 05/09/2022    1:30 PM  BMP  Glucose 70 - 99 mg/dL 90   85   BUN 8 - 23 mg/dL 40   40   Creatinine 0.61 - 1.24 mg/dL 2.12   2.19   Sodium 135 - 145 mmol/L 140  142  142   Potassium 3.5 - 5.1 mmol/L 4.3  4.7  4.4   Chloride 98 - 111 mmol/L 112   113   CO2 22 - 32 mmol/L 19   21   Calcium 8.9 - 10.3 mg/dL 9.9   10.2      Objective:   Vitals:  05/10/22 0940 05/10/22 0955  BP: (!) 109/51 (!) 97/46  Pulse:    Resp:    Temp:    SpO2:      General:AA&O x 3. Normal mood and affect   Vascular: DP and PT pulses 2/4 bilateral. Brisk capillary refill to all digits. Pedal hair present   Neruological. Epicritic sensation grossly intact.   Derm: left heel wound with necrotic base measuring about 3 cm x 4 cm . No erythema or edema present. No purulence. No probe to bone. Interspaces clears of maceration.      MSK: MMT 5/5 in dorsiflexion, plantar flexion, inversion and eversion. Normal joint ROM without pain or crepitus.      Assessment & Plan:  Patient was evaluated and treated and all questions answered.  DX: Bilateral heel decubitus ulcers.  Wound care: Agree with wound care xeroform dressing, DSD Antibiotics: Per primary  DME: Heel off loading boots.   Discussed with patient diagnosis and treatment options as much as posssible.  Recommend continued conservative wound care. No plan for any surgical intervention.   Patient in agreement with plan and  all questions answered.  Podiatry will sign off at this time.   Lorenda Peck, DPM  Accessible via secure chat for questions or concerns.

## 2022-05-10 NOTE — Progress Notes (Signed)
Echo attempted. Patient not in room at time of attempt. Will attempt again as schedule permits.  

## 2022-05-10 NOTE — Progress Notes (Signed)
  Echocardiogram 2D Echocardiogram has been performed.  Steven Williams 05/10/2022, 2:15 PM

## 2022-05-10 NOTE — Procedures (Signed)
PROCEDURE SUMMARY:  Successful US guided right thoracentesis. Yielded 1.2L of pleural fluid. Pt tolerated procedure well. No immediate complications.  Specimen was sent for labs. CXR ordered.  EBL < 5 mL  Ledarrius Beauchaine PA-C 05/10/2022 9:57 AM

## 2022-05-11 DIAGNOSIS — I10 Essential (primary) hypertension: Secondary | ICD-10-CM

## 2022-05-11 DIAGNOSIS — E038 Other specified hypothyroidism: Secondary | ICD-10-CM

## 2022-05-11 DIAGNOSIS — K4090 Unilateral inguinal hernia, without obstruction or gangrene, not specified as recurrent: Secondary | ICD-10-CM | POA: Diagnosis not present

## 2022-05-11 DIAGNOSIS — I5033 Acute on chronic diastolic (congestive) heart failure: Secondary | ICD-10-CM | POA: Diagnosis not present

## 2022-05-11 DIAGNOSIS — I480 Paroxysmal atrial fibrillation: Secondary | ICD-10-CM

## 2022-05-11 DIAGNOSIS — N184 Chronic kidney disease, stage 4 (severe): Secondary | ICD-10-CM | POA: Diagnosis not present

## 2022-05-11 LAB — BASIC METABOLIC PANEL
Anion gap: 8 (ref 5–15)
BUN: 39 mg/dL — ABNORMAL HIGH (ref 8–23)
CO2: 23 mmol/L (ref 22–32)
Calcium: 10 mg/dL (ref 8.9–10.3)
Chloride: 110 mmol/L (ref 98–111)
Creatinine, Ser: 2.31 mg/dL — ABNORMAL HIGH (ref 0.61–1.24)
GFR, Estimated: 26 mL/min — ABNORMAL LOW (ref >60)
Glucose, Bld: 133 mg/dL — ABNORMAL HIGH (ref 70–99)
Potassium: 4 mmol/L (ref 3.5–5.1)
Sodium: 141 mmol/L (ref 135–145)

## 2022-05-11 LAB — CBC
HCT: 26.7 % — ABNORMAL LOW (ref 39.0–52.0)
Hemoglobin: 8.7 g/dL — ABNORMAL LOW (ref 13.0–17.0)
MCH: 33.5 pg (ref 26.0–34.0)
MCHC: 32.6 g/dL (ref 30.0–36.0)
MCV: 102.7 fL — ABNORMAL HIGH (ref 80.0–100.0)
Platelets: 169 10*3/uL (ref 150–400)
RBC: 2.6 MIL/uL — ABNORMAL LOW (ref 4.22–5.81)
RDW: 15 % (ref 11.5–15.5)
WBC: 9.1 10*3/uL (ref 4.0–10.5)
nRBC: 0 % (ref 0.0–0.2)

## 2022-05-11 NOTE — Hospital Course (Signed)
Mr. Blandon was admitted to the hospital with the working diagnosis of decompensated heart failure.   88 y.o. male symptomatic bradycardia with pacer, chronic diastolic CHF, CKD stage IV, chronic anemia and thrombocytopenia, thoracic aortic aneurysm followed by CT surgery, and paroxysmal atrial fibrillation off of anticoagulation for the past month due to severe epistaxis who presented to Ed with worsening shortness of breath. reported worsening dyspnea for the last 7 days, associated with lethargy and confusion. Patient had orthopnea and lower extremity edema that had mild improvement with outpatient furosemide for 4 days.  Recently admitted 8/12-8/21 for cellulitis, ABIs were abnormal, vascular surgery consulted,  underwent an angiogram of LLE which revealed occluded anterior and posterior tibial arteries, and small vessel disease in the foot and ankle not amenable to revascularization, medical management recommended. At discharge his Lasix was held on account of AKI/CKD 4. On his initial physical examination his blood pressure was 155/64, HR 74, RR 20 and 02 saturation 96^% on room air. His lungs had decreased breath sounds, heart with S1 and S2 present, irregularly irregular, positive systolic murmur, abdomen with no distention, positive lower extremity edema. Left heel with pressure wound.   NA 142, K 4,4 Cl 113 bicarbonate 21, glucose 85, bun 40 cr 2,19  BNP 589 High sensitive troponin 25 and 23  Wbc 7,4 hgb 8,8 plt 188  TSH 4,7  Sars covid 19 negative  Urine analysis SG 1,015, 6-10 wbc   Chest radiograph with cardiomegaly with bilateral hilar vascular congestion and bilateral interstitial infiltrates more at the lower lobes. Bilateral pleural effusions more on right than left with bibasilar atelectasis.   EKG 64 bpm, normal axis, normal intervals, atrial fibrillation with multifocal PVC, no significant ST segment or T wave changes.  Head CT with no acute changes.  Ct chest with bilateral  ground glass opacities, bilateral pleural affusions more right than left with compression atelectasis. Centrilobular and paraseptal emphysema.   Patient was placed on furosemide for diuresis. His volume has improved, he continue very weak and deconditioned, plan to transfer to SNF.

## 2022-05-11 NOTE — Evaluation (Addendum)
Occupational Therapy Evaluation Patient Details Name: Steven Williams MRN: [id number] DOB: 25-Jan-1930 Today's Date: 05/11/2022   History of Present Illness Pt adm 9/2 from SNF with SOB. Pt with acute on chronic diastolic chf and rt pleural effusion. Rt thoracentesis on 9/3.  PMH - AAA; BPH; COPD; HTN; pacer, hypothyroidism; MDS; afib on Coumadin; recurrent epistaxis;   Clinical Impression   Pt reports using rollator and cane at baseline for mobility, reports independence with ADLs, was living at [facility]. Pt seen shortly after returning to bed from chair, fatigued and currently needing mod-max A for ADLs, min A and increased time for bed mobility, and max A for standing attempts from EOB. Pt SpO2 mid 90's on RA, however dropping to 70's intermittently for a few seconds with poor wave pleth. RN notified, supplemental O2 placed back on at end of session. Pt presenting with impairments listed below, will follow acutely. Recommend SNF at d/c.     Recommendations for follow up therapy are one component of a multi-disciplinary discharge planning process, led by the attending physician.  Recommendations may be updated based on patient status, additional functional criteria and insurance authorization.   Follow Up Recommendations  Skilled nursing-short term rehab (<3 hours/day)    Assistance Recommended at Discharge Frequent or constant Supervision/Assistance  Patient can return home with the following A lot of help with bathing/dressing/bathroom;Two people to help with walking and/or transfers;Assistance with cooking/housework;Direct supervision/assist for medications management;Direct supervision/assist for financial management;Assist for transportation;Help with stairs or ramp for entrance    Functional Status Assessment  Patient has had a recent decline in their functional status and demonstrates the ability to make significant improvements in function in a reasonable and predictable amount of  time.  Equipment Recommendations  None recommended by OT;Other (comment) (defer)    Recommendations for Other Services PT consult     Precautions / Restrictions Precautions Precautions: Fall Restrictions Weight Bearing Restrictions: No      Mobility Bed Mobility Overal bed mobility: Needs Assistance Bed Mobility: Sit to Supine, Supine to Sit     Supine to sit: Min assist Sit to supine: Min assist   General bed mobility comments: min A for trunk elevation    Transfers Overall transfer level: Needs assistance Equipment used: Rolling walker (2 wheels) Transfers: Sit to/from Stand             General transfer comment: attempted sit to stand x2, pt minimally able to clear hips from bed      Balance Overall balance assessment: Needs assistance Sitting-balance support: Feet supported, Bilateral upper extremity supported Sitting balance-Leahy Scale: Fair Sitting balance - Comments: sits EOB without LOB   Standing balance support: Bilateral upper extremity supported Standing balance-Leahy Scale: Poor                             ADL either performed or assessed with clinical judgement   ADL Overall ADL's : Needs assistance/impaired Eating/Feeding: Supervision/ safety   Grooming: Min guard   Upper Body Bathing: Moderate assistance;Sitting   Lower Body Bathing: Maximal assistance;Sitting/lateral leans;Bed level   Upper Body Dressing : Moderate assistance;Sitting   Lower Body Dressing: Maximal assistance;Bed level   Toilet Transfer: Maximal assistance;+2 for physical assistance;BSC/3in1;Stand-pivot;Squat-pivot   Toileting- Clothing Manipulation and Hygiene: Maximal assistance;+2 for physical assistance       Functional mobility during ADLs: Maximal assistance;+2 for physical assistance       Vision   Vision Assessment?: No apparent visual  deficits     Perception     Praxis      Pertinent Vitals/Pain Pain Assessment Pain Assessment:  Faces Pain Score: 3  Faces Pain Scale: Hurts little more Pain Location: BLE's Pain Descriptors / Indicators: Grimacing Pain Intervention(s): Limited activity within patient's tolerance, Monitored during session, Repositioned     Hand Dominance Right   Extremity/Trunk Assessment Upper Extremity Assessment Upper Extremity Assessment: Generalized weakness   Lower Extremity Assessment Lower Extremity Assessment: Defer to PT evaluation   Cervical / Trunk Assessment Cervical / Trunk Assessment: Normal   Communication Communication Communication:  (softspoken at times)   Cognition Arousal/Alertness: Awake/alert Behavior During Therapy: WFL for tasks assessed/performed Overall Cognitive Status: No family/caregiver present to determine baseline cognitive functioning                                 General Comments: pt following commands appropriately, some decreased safety awareness     General Comments  VSS on RA during session, SpO2 ranging from 92-100% with good wave pleth, few instances of poor wave pleth reading ~71% and mid 80's when pt pulling/pushing from bedrails, returned to 90's once pt remained still, RN notified.    Exercises     Shoulder Instructions      Home Living Family/patient expects to be discharged to:: Skilled nursing facility                             Home Equipment: Rollator (4 wheels);Cane - single point   Additional Comments: Was at [facility] prior to recent hospitalization      Prior Functioning/Environment Prior Level of Function : Needs assist  Cognitive Assist : Mobility (cognitive)     Physical Assist : Mobility (physical);ADLs (physical) Mobility (physical): Bed mobility;Transfers   Mobility Comments: Has been non ambulatory since hospitalization several weeks ago. Assist for bed mobility and transfers. Prior to that hospitalization pt was amb with rollator to the dining room ADLs Comments: reports  independence with ADLs, uses cafeteria for meals per chart review from prior admission, reports he does not wear O2 at baseline        OT Problem List: Decreased strength;Decreased range of motion;Decreased activity tolerance;Impaired balance (sitting and/or standing);Decreased safety awareness      OT Treatment/Interventions: Self-care/ADL training;Therapeutic exercise;Energy conservation;DME and/or AE instruction;Therapeutic activities;Patient/family education;Balance training    OT Goals(Current goals can be found in the care plan section) Acute Rehab OT Goals Patient Stated Goal: none stated OT Goal Formulation: With patient Time For Goal Achievement: 05/25/22 Potential to Achieve Goals: Good ADL Goals Pt Will Perform Upper Body Dressing: with min guard assist;sitting Pt Will Perform Lower Body Dressing: sitting/lateral leans;bed level;with mod assist Pt Will Transfer to Toilet: with mod assist;stand pivot transfer;squat pivot transfer;bedside commode Additional ADL Goal #1: pt will perform bed mobility with supervision in prep for ADLs  OT Frequency: Min 2X/week    Co-evaluation              AM-PAC OT "6 Clicks" Daily Activity     Outcome Measure Help from another person eating meals?: None Help from another person taking care of personal grooming?: A Little Help from another person toileting, which includes using toliet, bedpan, or urinal?: A Lot Help from another person bathing (including washing, rinsing, drying)?: A Lot Help from another person to put on and taking off regular upper body clothing?: A Lot  Help from another person to put on and taking off regular lower body clothing?: Total 6 Click Score: 14   End of Session Equipment Utilized During Treatment: Gait belt;Rolling walker (2 wheels) Nurse Communication: Mobility status;Other (comment) (SpO2 levels throughout session)  Activity Tolerance: Patient tolerated treatment well Patient left: in bed;with call  bell/phone within reach  OT Visit Diagnosis: Unsteadiness on feet (R26.81);Other abnormalities of gait and mobility (R26.89);Muscle weakness (generalized) (M62.81)                Time: 1430-1500 OT Time Calculation (min): 30 min Charges:  OT General Charges $OT Visit: 1 Visit OT Evaluation $OT Eval Moderate Complexity: 1 Mod OT Treatments $Therapeutic Activity: 8-22 mins  Lynnda Child, OTD, OTR/L Acute Rehab [phone]) 832 - [place] 05/11/2022, 3:44 PM

## 2022-05-11 NOTE — Progress Notes (Signed)
Progress Note   Patient: Steven Williams GNF:[id number] DOB: 1930-02-03 DOA: 05/09/2022     2 DOS: the patient was seen and examined on 05/11/2022   Brief hospital course: 88 y.o. male symptomatic bradycardia with pacer, chronic diastolic CHF, CKD stage IV, chronic anemia and thrombocytopenia, thoracic aortic aneurysm followed by CT surgery, and paroxysmal atrial fibrillation off of anticoagulation for the past month due to severe epistaxis who presented to Ed with worsening shortness of breath.  -recently admitted 8/12-8/21 for cellulitis, ABIs were abnormal, vascular surgery consulted,  underwent an angiogram of LLE which revealed occluded anterior and posterior tibial arteries, and and small vessel disease in the foot and ankle not amenable to revascularization, medical management recommended. -At discharge his Lasix was held on account of AKI/CKD 4 -Back in the ED 9/2 with left leg pain, dyspnea, BNP 589, chest x-ray with bilateral pleural effusions, CT chest noted moderate to large right pleural effusion and patchy airspace disease in the left with small left pleural effusion  Assessment and Plan: * Acute on chronic diastolic CHF (congestive heart failure) ([place]) His volume status is improving.  Echocardiogram with preserved LV systolic function  Urine output is not documented Systolic blood pressure 93 to 99 mmHg.  Plan to continue medical therapy with carvedilol and digoxin Continue diuresis with furosemide 40 mg IV q12.    Acute hypoxemic respiratory failure, right pleural effusion due to cardiogenic pulmonary edema (acute) Continue diuresis and supplemental 02 per [place].  Patient rule out for pneumonia and antibiotic therapy has been discontinued.   Left inguinal hernia Known left inguinal hernia from CT abdomen in 2021.  Not reproducible with new pain  Ct abdomen/pelvis without contrast   CKD (chronic kidney disease) stage 4, GFR 15-29 ml/min (HCC) Patient with improvement in volume  status. Renal function today with serum cr at 2.31 with K at 4,0 and serum bicarbonate at 23. Plan to continue diuresis and follow up renal function in am, avoid hypotension and nephrotoxic medications.   Paroxysmal atrial fibrillation (HCC) Continue rate control with carvedilol and anticoagulation with apixaban.   Essential hypertension Continue with carvedilol and diuresis with furosemide.   Descending thoracic aortic aneurysm (HCC) measuring up to 4.1 cm in diameter in the ascending segment.  surveillance with cardiothoracic surgery   PAD (peripheral artery disease) ([place]) At recent hospitalization he underwent LLE angiogram which showed occlusion of anterior and posterior tibial arteries Continue ASA/statin and monitoring for bleeding with hx of epistaxis.  -some tenderness in left lower leg that feels like phlebitis. On evidence of infection. If continues would have vascular stop by  - LDL 42 from lipid panel 8/17 - follow up with [facility] in 1 month and podiatry   Hyperlipidemia Continue lipitor   Hypothyroidism TSH wnl in 2022 Repeat, continue home synthroid   Chronic anemia Stable, continue oral iron   Sacral ulcer ([place]) Chronic appearing Wound consult         Subjective: Patient is feeling better, dyspnea has improved, continue to have lower extremity edema   Physical Exam: Vitals:   05/10/22 2349 05/11/22 0533 05/11/22 0745 05/11/22 0747  BP: (!) 92/49 (!) 99/49  (!) 93/57  Pulse: 65 80 60   Resp: '17 16  18  '$ Temp: 97.7 F (36.5 C) 98.4 F (36.9 C)  98.6 F (37 C)  TempSrc: Oral Axillary  Oral  SpO2:  93% 94%   Weight:  88.2 kg    Height:       Neurology awake and alert  ENT with mild pallor Cardiovascular with S1 and S2 present with no gallops, positive murmur at the left lower sternal border Respiratory with no rales or rhonchi Abdomen not distended Positive lower extremity edema + to++ Data Reviewed:    Family Communication: no family at the  bedside   Disposition: Status is: Inpatient Remains inpatient appropriate because: heart failure   Planned Discharge Destination: Skilled nursing facility     Author: Tawni Millers, MD 05/11/2022 3:01 PM  For on call review [email or web address].

## 2022-05-11 NOTE — Progress Notes (Signed)
Changed right heel dressing per orders.  Attempted to complete dressing change on left heel and apply prevalon boots.  Pt requested to come  back to complete.

## 2022-05-11 NOTE — Evaluation (Addendum)
Physical Therapy Evaluation Patient Details Name: Steven Williams MRN: [id number] DOB: 1930-02-09 Today's Date: 05/11/2022  History of Present Illness  Pt adm 9/2 from SNF with SOB. Pt with acute on chronic diastolic chf and rt pleural effusion. Rt thoracentesis on 9/3.  PMH - AAA; BPH; COPD; HTN; pacer, hypothyroidism; MDS; afib on Coumadin; recurrent epistaxis;  Clinical Impression  Pt presents to PT with poor mobility due to decr strength and balance. Hasn't been ambulatory in several weeks since his last hospitalization in mid August. Recommend return to SNF for further rehab at DC.        Recommendations for follow up therapy are one component of a multi-disciplinary discharge planning process, led by the attending physician.  Recommendations may be updated based on patient status, additional functional criteria and insurance authorization.  Follow Up Recommendations Skilled nursing-short term rehab (<3 hours/day) Can patient physically be transported by private vehicle: No    Assistance Recommended at Discharge Frequent or constant Supervision/Assistance  Patient can return home with the following  Two people to help with walking and/or transfers;A lot of help with bathing/dressing/bathroom;Assist for transportation;Direct supervision/assist for medications management;Assistance with cooking/housework    Equipment Recommendations Wheelchair (measurements PT);Wheelchair cushion (measurements PT)  Recommendations for Other Services       Functional Status Assessment Patient has had a recent decline in their functional status and demonstrates the ability to make significant improvements in function in a reasonable and predictable amount of time.     Precautions / Restrictions Precautions Precautions: Fall      Mobility  Bed Mobility Overal bed mobility: Needs Assistance Bed Mobility: Supine to Sit     Supine to sit: HOB elevated, Max assist     General bed mobility comments:  Assist to bring legs off of bed, elevate trunk into sitting and bring hips to EOB.    Transfers Overall transfer level: Needs assistance Equipment used: Ambulation equipment used Transfers: Sit to/from Stand, Bed to chair/wheelchair/BSC Sit to Stand: Mod assist, From elevated surface           General transfer comment: unable to stand from elevated bed with walker with 1 person assist. Switched to [facility] and able to stand from elevated bed with mod assist to bring hips up. Used Stedy for bed to chair. Transfer via Lift Equipment: Stedy  Ambulation/Gait               General Gait Details: unable  Financial trader Rankin (Stroke Patients Only)       Balance Overall balance assessment: Needs assistance Sitting-balance support: Feet supported, No upper extremity supported Sitting balance-Leahy Scale: Fair     Standing balance support: Bilateral upper extremity supported Standing balance-Leahy Scale: Poor Standing balance comment: Stedy and mod assist for static standing                             Pertinent Vitals/Pain Pain Assessment Pain Assessment: Faces Faces Pain Scale: Hurts a little bit Pain Location: BLE's Pain Descriptors / Indicators: Grimacing Pain Intervention(s): Limited activity within patient's tolerance, Repositioned    Home Living Family/patient expects to be discharged to:: Skilled nursing facility                 Home Equipment: Rollator (4 wheels);Cane - single point Additional Comments: Was at [facility] prior to recent hospitalization    Prior Function  Prior Level of Function : Needs assist       Physical Assist : Mobility (physical);ADLs (physical) Mobility (physical): Bed mobility;Transfers   Mobility Comments: Has been non ambulatory since hospitalization several weeks ago. Assist for bed mobility and transfers. Prior to that hospitalization pt was amb with rollator to the  dining room       Hand Dominance   Dominant Hand: Right    Extremity/Trunk Assessment   Upper Extremity Assessment Upper Extremity Assessment: Defer to OT evaluation    Lower Extremity Assessment Lower Extremity Assessment: Generalized weakness       Communication   Communication: No difficulties  Cognition Arousal/Alertness: Awake/alert Behavior During Therapy: WFL for tasks assessed/performed Overall Cognitive Status: No family/caregiver present to determine baseline cognitive functioning                                          General Comments General comments (skin integrity, edema, etc.): VSS on RA    Exercises     Assessment/Plan    PT Assessment Patient needs continued PT services  PT Problem List Decreased strength;Decreased activity tolerance;Decreased balance;Decreased mobility       PT Treatment Interventions DME instruction;Gait training;Functional mobility training;Therapeutic activities;Therapeutic exercise;Balance training;Patient/family education    PT Goals (Current goals can be found in the Care Plan section)  Acute Rehab PT Goals Patient Stated Goal: not stated PT Goal Formulation: With patient Time For Goal Achievement: 05/25/22 Potential to Achieve Goals: Good    Frequency Min 2X/week     Co-evaluation               AM-PAC PT "6 Clicks" Mobility  Outcome Measure Help needed turning from your back to your side while in a flat bed without using bedrails?: A Lot Help needed moving from lying on your back to sitting on the side of a flat bed without using bedrails?: A Lot Help needed moving to and from a bed to a chair (including a wheelchair)?: Total Help needed standing up from a chair using your arms (e.g., wheelchair or bedside chair)?: Total Help needed to walk in hospital room?: Total Help needed climbing 3-5 steps with a railing? : Total 6 Click Score: 8    End of Session Equipment Utilized During  Treatment: Gait belt Activity Tolerance: Patient limited by fatigue Patient left: in chair;with call bell/phone within reach;with chair alarm set Nurse Communication: Mobility status;Need for lift equipment PT Visit Diagnosis: Other abnormalities of gait and mobility (R26.89);Muscle weakness (generalized) (M62.81)    Time: 7829-5621 PT Time Calculation (min) (ACUTE ONLY): 37 min   Charges:   PT Evaluation $PT Eval Moderate Complexity: 1 Mod PT Treatments $Therapeutic Activity: 8-22 mins        [place] 05/11/2022, 1:58 PM

## 2022-05-11 NOTE — Assessment & Plan Note (Addendum)
Echocardiogram with preserved LV systolic function EF 60 to 65%, moderate LVH, RV systolic function is preserved, moderate dilatation on left and right atrium, moderate to severe Mitral valve regurgitation, mild holosystolic prolapse of both leaflets of the mitral valve. Severe tricuspid valve regurgitation.   Patient was admitted to the cardiac ward, he was placed on IV furosemide for diuresis, negative fluid balance was achieved, with significant improvement in his symptoms.   At the time of his discharge he will continue with carvedilol and digoxin. Resume furosemide 40 mg daily and instructions to increase in case of volume overload.  No RAS inhibition due to risk of worsening renal function.  Hold on SGLT 2 inh for now until renal function more stable.   Systolic blood pressure is 90 to 120 mmHg.   Acute hypoxemic respiratory failure, right pleural effusion due to cardiogenic pulmonary edema (acute). 09.03 right thoracentesis 1,2 L with good toleration.  At the time of his discharge his 02 saturation is 94% on room air.    Patient ruled out for pneumonia and antibiotic therapy has been discontinued.

## 2022-05-11 NOTE — TOC Initial Note (Addendum)
Transition of Care [facility] - Initial/Assessment Note    Patient Details  Name: Steven Williams MRN: [id number] Date of Birth: [birth year]  Transition of Care [facility]) CM/SW Contact:    Milas Gain, [place] Phone Number: 05/11/2022, 5:34 PM  Clinical Narrative:                  CSW received consult for possible SNF placement at time of discharge. CSW spoke with patient at bedside regarding PT recommendation of SNF placement at time of discharge. Patient reports he comes from [facility] short term. Patient expressed understanding of PT recommendation and is agreeable to SNF placement at time of discharge. Patient reports preference to return to [facility] . Patient reports he has received the  COVID vaccines and 1 booster. Patient expressed being hopeful for rehab and to feel better soon. No further questions reported at this time. CSW to continue to follow and assist with discharge planning needs.        Patient Goals and CMS Choice        Expected Discharge Plan and Services                                                Prior Living Arrangements/Services                       Activities of Daily Living      Permission Sought/Granted                  Emotional Assessment              Admission diagnosis:  Acute confusional state [F05] Pleural effusion [J90] Bacteria in urine [R82.71] Acute on chronic diastolic CHF (congestive heart failure) (HCC) [I50.33] Anemia, unspecified type [D64.9] Dyspnea, unspecified type [R06.00] Community acquired pneumonia of right lower lobe of lung [J18.9] Patient Active Problem List   Diagnosis Date Noted   Acute on chronic diastolic CHF (congestive heart failure) ([place]) 05/09/2022   Left inguinal hernia 05/09/2022   Sacral ulcer ([place]) 05/09/2022   PAD (peripheral artery disease) ([place]) 04/24/2022   Cellulitis of left leg 04/18/2022   Acute renal failure superimposed on stage 4 chronic kidney disease ([place])  04/18/2022   Chronic diastolic CHF (congestive heart failure) ([place]) 04/18/2022   Epistaxis 01/10/2022   DNR (do not resuscitate) 01/09/2022   Chronic venous hypertension (idiopathic) with ulcer and inflammation of bilateral lower extremity ([place]) 10/18/2021   Hypertensive heart and renal disease 10/18/2021   Lumbar radiculopathy 10/18/2021   Non-pressure chronic ulcer of unspecified part of right lower leg with unspecified severity ([place]) 10/18/2021   Unsteady gait 10/18/2021   Transient ischemic attack 10/18/2021   Pain of right hip joint 02/17/2021   Descending thoracic aortic aneurysm ([place]) 10/23/2020   Hypercoagulable state ([place]) 10/23/2020   Preventative health care 10/23/2020   Anticoagulated 06/26/2020   Nasal septal deviation 06/26/2020   Recurrent epistaxis 06/26/2020   Headache 01/19/2020   Localized edema 12/29/2019   Long term (current) use of anticoagulants 12/25/2019   Dysphagia 07/06/2019   Restless legs syndrome 05/19/2019   Thrombocytopenia ([place]) 11/08/2018   Basal cell carcinoma of scalp 08/26/2018   Atherosclerotic heart disease of native coronary artery without angina pectoris 08/10/2018   CKD (chronic kidney disease) stage 4, GFR 15-29 ml/min (HCC) 07/28/2018   Chronic anemia 07/27/2018   Benign  prostatic hyperplasia without lower urinary tract symptoms 07/27/2018   Carotid artery occlusion 07/27/2018   Essential hypertension 07/27/2018   Gout 07/27/2018   Hyperlipidemia 07/27/2018   Hypothyroidism 07/27/2018   Macular degeneration 07/27/2018   Paroxysmal atrial fibrillation ([place]) 07/27/2018   Polyneuropathy 07/27/2018   Rheumatoid arthritis ([place]) 07/27/2018   PCP:  Ginger Organ., MD Pharmacy:   [facility], [address] [place] [address] Phone: [phone] Fax: [phone]     Social Determinants of Health (SDOH) Interventions    Readmission Risk  Interventions    04/21/2022    9:52 AM  Readmission Risk Prevention Plan  Transportation Screening Complete  Medication Review ([place]) Complete  PCP or Specialist appointment within 3-5 days of discharge Complete  HRI or [place] Complete  SW Recovery Care/Counseling Consult Complete  Palliative Care Screening Complete  [place] Not Applicable

## 2022-05-11 NOTE — Progress Notes (Signed)
Physical Therapy Treatment Patient Details Name: Steven Williams MRN: [id number] DOB: 1929/11/12 Today's Date: 05/11/2022   History of Present Illness Pt adm 9/2 from SNF with SOB. Pt with acute on chronic diastolic chf and rt pleural effusion. Rt thoracentesis on 9/3.  PMH - AAA; BPH; COPD; HTN; pacer, hypothyroidism; MDS; afib on Coumadin; recurrent epistaxis;    PT Comments    Pt tolerated 2 hours in recliner before fatiguing and beginning to lean in chair. Used Stedy to stand and transfer back to bed. Required 2 person assist as anticipated due to lower surface of chair.   Recommendations for follow up therapy are one component of a multi-disciplinary discharge planning process, led by the attending physician.  Recommendations may be updated based on patient status, additional functional criteria and insurance authorization.  Follow Up Recommendations  Skilled nursing-short term rehab (<3 hours/day) Can patient physically be transported by private vehicle: No   Assistance Recommended at Discharge Frequent or constant Supervision/Assistance  Patient can return home with the following Two people to help with walking and/or transfers;A lot of help with bathing/dressing/bathroom;Assist for transportation;Direct supervision/assist for medications management;Assistance with cooking/housework   Equipment Recommendations  Wheelchair (measurements PT);Wheelchair cushion (measurements PT)    Recommendations for Other Services       Precautions / Restrictions Precautions Precautions: Fall     Mobility  Bed Mobility Overal bed mobility: Needs Assistance Bed Mobility: Sit to Supine     Supine to sit: HOB elevated, Max assist Sit to supine: Mod assist   General bed mobility comments: Assist to bring legs back up into bed    Transfers Overall transfer level: Needs assistance Equipment used: Ambulation equipment used Transfers: Sit to/from Stand, Bed to chair/wheelchair/BSC Sit to  Stand: +2 physical assistance, Mod assist           General transfer comment: Assist to bring hips up from low surface of chair using bed pad under his hips. Chair to bed using Stedy Transfer via Lift Equipment: Stedy  Ambulation/Gait               General Gait Details: unable   Marine scientist Rankin (Stroke Patients Only)       Balance Overall balance assessment: Needs assistance Sitting-balance support: Feet supported, Bilateral upper extremity supported Sitting balance-Leahy Scale: Poor Sitting balance - Comments: UE support due to fatigue   Standing balance support: Bilateral upper extremity supported Standing balance-Leahy Scale: Poor Standing balance comment: Stedy and mod assist for static standing                            Cognition Arousal/Alertness: Awake/alert Behavior During Therapy: WFL for tasks assessed/performed Overall Cognitive Status: No family/caregiver present to determine baseline cognitive functioning                                          Exercises      General Comments General comments (skin integrity, edema, etc.): VSS on 2L      Pertinent Vitals/Pain Pain Assessment Pain Assessment: Faces Faces Pain Scale: Hurts a little bit Pain Location: BLE's Pain Descriptors / Indicators: Grimacing Pain Intervention(s): Limited activity within patient's tolerance, Repositioned    Home Living Family/patient expects to be discharged to:: Skilled nursing facility  Home Equipment: Rollator (4 wheels);Cane - single point Additional Comments: Was at [facility] prior to recent hospitalization    Prior Function            PT Goals (current goals can now be found in the care plan section) Acute Rehab PT Goals Patient Stated Goal: not stated PT Goal Formulation: With patient Time For Goal Achievement: 05/25/22 Potential to Achieve Goals:  Good Progress towards PT goals: Progressing toward goals    Frequency    Min 2X/week      PT Plan Current plan remains appropriate    Co-evaluation              AM-PAC PT "6 Clicks" Mobility   Outcome Measure  Help needed turning from your back to your side while in a flat bed without using bedrails?: A Lot Help needed moving from lying on your back to sitting on the side of a flat bed without using bedrails?: A Lot Help needed moving to and from a bed to a chair (including a wheelchair)?: Total Help needed standing up from a chair using your arms (e.g., wheelchair or bedside chair)?: Total Help needed to walk in hospital room?: Total Help needed climbing 3-5 steps with a railing? : Total 6 Click Score: 8    End of Session Equipment Utilized During Treatment: Gait belt Activity Tolerance: Patient limited by fatigue Patient left: with call bell/phone within reach;in bed;with nursing/sitter in room Nurse Communication: Mobility status;Need for lift equipment (Assisted by nurse tech) PT Visit Diagnosis: Other abnormalities of gait and mobility (R26.89);Muscle weakness (generalized) (M62.81)     Time: 2831-5176 PT Time Calculation (min) (ACUTE ONLY): 15 min  Charges:  $Therapeutic Activity: 8-22 mins                     [facility] [place] 05/11/2022, 2:12 PM

## 2022-05-11 NOTE — Evaluation (Signed)
Clinical/Bedside Swallow Evaluation Patient Details  Name: Steven Williams MRN: [id number] Date of Birth: [birth year]  Today's Date: 05/11/2022 Time: SLP Start Time (ACUTE ONLY): 38 SLP Stop Time (ACUTE ONLY): 3614 SLP Time Calculation (min) (ACUTE ONLY): 42 min  Past Medical History:  Past Medical History:  Diagnosis Date   Antiphospholipid antibody positive    Aortic aneurysm (HCC)    thoracic, Dr Roxan Hockey, with h/o dissection   BPH (benign prostatic hyperplasia)    Central retinal artery occlusion    Chronic anemia    Chronic renal insufficiency    COPD (chronic obstructive pulmonary disease) (HCC)    DDD (degenerative disc disease), cervical    DJD (degenerative joint disease)    DVT (deep venous thrombosis) (HCC)    Gastric dysmotility    GERD (gastroesophageal reflux disease)    Gout    HTN (hypertension)    Hyperlipidemia    Hypertensive retinopathy    OU   Hypothyroid    Macular degeneration    Myelodysplasia (myelodysplastic syndrome) (HCC)    Paget's disease of bone    Peripheral neuropathy    Permanent atrial fibrillation (HCC)    Recurrent epistaxis 06/26/2020   Rheumatoid arthritis (HCC)    TIA (transient ischemic attack)    Past Surgical History:  Past Surgical History:  Procedure Laterality Date   ABDOMINAL AORTOGRAM W/LOWER EXTREMITY Left 04/24/2022   Procedure: ABDOMINAL AORTOGRAM W/LOWER EXTREMITY;  Surgeon: Cherre Robins, MD;  Location: [facility];  Service: Cardiovascular;  Laterality: Left;   ATRIAL FIBRILLATION ABLATION  09/23/2014   Dr Encarnacion Chu in [place]     lumbar, x 2   CAROTID ENDARTERECTOMY Right 1990   CAROTID STENT     CATARACT EXTRACTION Bilateral 2010   [place]   EYE SURGERY Bilateral 2010   [facility]   LUNG DECORTICATION     [facility]   PACEMAKER GENERATOR CHANGE  12/03/2011   MDT [facility] ADDR01 generator change by Dr Encarnacion Chu for symptomatic bradycardia   PACEMAKER IMPLANT  2004   MDT    HPI:  88 y.o. male  presented to ED with worsening SOB. Dx acute on chronic diastolic CHF, right pleural effusion. Per report, pt has been complaining of difficulty swallowing.  Recently admitted 8/12-8/21 for cellulitis. History of symptomatic bradycardia with pacer, GERD, chronic diastolic CHF, CKD stage IV, chronic anemia and thrombocytopenia, thoracic aortic aneurysm followed by CT surgery, and paroxysmal atrial fibrillation off of anticoagulation for the past month due to severe epistaxis. Hx OP MBS July 2021 due to c/o globus and foods/liquids getting "hung up" in throat. He demonstrated occasional penetration of thin liquids; no aspiration. Regular solids/thin liquids were recommended. Esophagram 07/25/19: nonspecific esophageal motility disorder, no stricture, small web cervical esophagus that did not obstruct 13 mm barium pill.    Assessment / Plan / Recommendation  Clinical Impression  [name] participated in a bedside swallowing evaluation.  Oral mechanism exam was normal. He described globus, occasional regurgitation of solid foods, and early satiety. Exam revealed occasional coughing initially - this subsided early into the assessment. He was able to masticate solids, drink thin liquids, and swallow his pills with thin liquids with notable belching but no overt s/s of aspiration. Given hx of esophageal dysmotility and small cervical web, it is likely these are contributing to current complaints. Recommend continuing current diet (regular solids, thin liquids). Consider esophageal assessment and/or GI f/u.  No further SLP f/u is needed. SLP Visit Diagnosis: Dysphagia, unspecified (R13.10)  Aspiration Risk  No limitations    Diet Recommendation   Continue regular solids, thin liquids  Medication Administration: Whole meds with liquid    Other  Recommendations Recommended Consults: Consider esophageal assessment Oral Care Recommendations: Oral care BID    Recommendations for follow up therapy are one  component of a multi-disciplinary discharge planning process, led by the attending physician.  Recommendations may be updated based on patient status, additional functional criteria and insurance authorization.  Follow up Recommendations No SLP follow up        Swallow Study   General Date of Onset: 05/10/22 HPI: 88 y.o. male presented to ED with worsening SOB. Dx acute on chronic diastolic CHF, right pleural effusion. Per report, pt has been complaining of difficulty swallowing.  Recently admitted 8/12-8/21 for cellulitis. History of symptomatic bradycardia with pacer, GERD, chronic diastolic CHF, CKD stage IV, chronic anemia and thrombocytopenia, thoracic aortic aneurysm followed by CT surgery, and paroxysmal atrial fibrillation off of anticoagulation for the past month due to severe epistaxis. Hx OP MBS July 2021 due to c/o globus and foods/liquids getting "hung up" in throat. He demonstrated occasional penetration of thin liquids; no aspiration. Regular solids/thin liquids were recommended. Esophagram 07/25/19: nonspecific esophageal motility disorder, no stricture, small web cervical esophagus that did not obstruct 13 mm barium pill. Type of Study: Bedside Swallow Evaluation Previous Swallow Assessment: see HPI Diet Prior to this Study: Regular;Thin liquids Temperature Spikes Noted: No Respiratory Status: Nasal cannula History of Recent Intubation: No Behavior/Cognition: Alert;Cooperative;Pleasant mood Oral Cavity Assessment: Within Functional Limits Oral Care Completed by SLP: Recent completion by staff Oral Cavity - Dentition: Adequate natural dentition Vision: Functional for self-feeding Self-Feeding Abilities: Able to feed self Patient Positioning: Upright in bed Baseline Vocal Quality: Normal Volitional Cough: Strong Volitional Swallow: Able to elicit    Oral/Motor/Sensory Function Overall Oral Motor/Sensory Function: Within functional limits   Ice Chips Ice chips: Within  functional limits   Thin Liquid Thin Liquid: Within functional limits    Nectar Thick Nectar Thick Liquid: Not tested   Honey Thick Honey Thick Liquid: Not tested   Puree Puree: Within functional limits   Solid     Solid: Within functional limits      Juan Quam Laurice 05/11/2022,11:25 AM  Estill Bamberg L. Tivis Ringer, MA CCC/SLP Clinical Specialist - [place] Office number [phone]

## 2022-05-12 DIAGNOSIS — I5033 Acute on chronic diastolic (congestive) heart failure: Secondary | ICD-10-CM | POA: Diagnosis not present

## 2022-05-12 LAB — BASIC METABOLIC PANEL
Anion gap: 14 (ref 5–15)
BUN: 39 mg/dL — ABNORMAL HIGH (ref 8–23)
CO2: 22 mmol/L (ref 22–32)
Calcium: 10 mg/dL (ref 8.9–10.3)
Chloride: 106 mmol/L (ref 98–111)
Creatinine, Ser: 2.34 mg/dL — ABNORMAL HIGH (ref 0.61–1.24)
GFR, Estimated: 26 mL/min — ABNORMAL LOW (ref >60)
Glucose, Bld: 105 mg/dL — ABNORMAL HIGH (ref 70–99)
Potassium: 3.7 mmol/L (ref 3.5–5.1)
Sodium: 142 mmol/L (ref 135–145)

## 2022-05-12 LAB — MAGNESIUM: Magnesium: 1.6 mg/dL — ABNORMAL LOW (ref 1.7–2.4)

## 2022-05-12 MED ORDER — CARVEDILOL 3.125 MG PO TABS
3.1250 mg | ORAL_TABLET | Freq: Two times a day (BID) | ORAL | Status: DC
  Administered 2022-05-12 – 2022-05-15 (×6): 3.125 mg via ORAL
  Filled 2022-05-12 (×6): qty 1

## 2022-05-12 MED ORDER — MAGNESIUM OXIDE -MG SUPPLEMENT 400 (240 MG) MG PO TABS
800.0000 mg | ORAL_TABLET | Freq: Two times a day (BID) | ORAL | Status: AC
Start: 2022-05-12 — End: 2022-05-12
  Administered 2022-05-12 (×2): 800 mg via ORAL
  Filled 2022-05-12 (×2): qty 2

## 2022-05-12 NOTE — Progress Notes (Addendum)
Progress Note   Patient: Steven Williams AYT:[id number] DOB: 1930-04-19 DOA: 05/09/2022     3 DOS: the patient was seen and examined on 05/12/2022   Brief hospital course: 88 y.o. male symptomatic bradycardia with pacer, chronic diastolic CHF, CKD stage IV, chronic anemia and thrombocytopenia, thoracic aortic aneurysm followed by CT surgery, and paroxysmal atrial fibrillation off of anticoagulation for the past month due to severe epistaxis who presented to Ed with worsening shortness of breath.  -recently admitted 8/12-8/21 for cellulitis, ABIs were abnormal, vascular surgery consulted,  underwent an angiogram of LLE which revealed occluded anterior and posterior tibial arteries, and and small vessel disease in the foot and ankle not amenable to revascularization, medical management recommended. -At discharge his Lasix was held on account of AKI/CKD 4 -Back in the ED 9/2 with left leg pain, dyspnea, BNP 589, chest x-ray with bilateral pleural effusions, CT chest noted moderate to large right pleural effusion and patchy airspace disease in the left with small left pleural effusion  Assessment and Plan:   Acute hypoxemic respiratory failure, right pleural effusion due to cardiogenic pulmonary edema (acute) secondary to acute on chronic HFpEF decompensation and moderate-severe MR and severe TR -Still volume overloaded, plan to continue IV furosemide 40 mg IV q12.   -Echocardiogram with preserved LV systolic function -Continue diuresis and supplemental 02 per [place].  -Patient rule out for pneumonia and off ABX.  Left inguinal hernia Known left inguinal hernia from CT abdomen in 2021.  Not reproducible with new pain  Ct abdomen/pelvis without contrast   CKD (chronic kidney disease) stage 4, GFR 15-29 ml/min (HCC) Improving in volume status. Cre level stable Plan to continue diuresis and follow up renal function in am, avoid hypotension and nephrotoxic medications.   Paroxysmal atrial fibrillation  (HCC) Continue rate control with carvedilol and anticoagulation with apixaban.   Essential hypertension Continue with carvedilol and diuresis with furosemide.   Descending thoracic aortic aneurysm (HCC) measuring up to 4.1 cm in diameter in the ascending segment.  surveillance with cardiothoracic surgery   PAD (peripheral artery disease) ([place]) At recent hospitalization he underwent LLE angiogram which showed occlusion of anterior and posterior tibial arteries Continue ASA/statin and monitoring for bleeding with hx of epistaxis.  -some tenderness in left lower leg that feels like phlebitis. No evidence of infection. If continues would have vascular stop by  - LDL 42 from lipid panel 8/17 - follow up with [facility] in 1 month and podiatry   Hyperlipidemia Continue lipitor   Hypothyroidism TSH wnl in 2022 Repeat, continue home synthroid   Chronic anemia Stable, continue oral iron   Sacral ulcer (HCC) Chronic appearing Wound consult         Subjective:  Subjective:  Eyes: PERRL, lids and conjunctivae normal ENMT: Mucous membranes are moist. Posterior pharynx clear of any exudate or lesions.Normal dentition.  Neck: normal, supple, no masses, no thyromegaly Respiratory: clear to auscultation bilaterally, no wheezing, B/L low fields crackles. Increasing respiratory effort. No accessory muscle use.  Cardiovascular: Regular rate and rhythm, no murmurs / rubs / gallops. 1+ extremity edema. 2+ pedal pulses. No carotid bruits.  Abdomen: no tenderness, no masses palpated. No hepatosplenomegaly. Bowel sounds positive.  Musculoskeletal: no clubbing / cyanosis. No joint deformity upper and lower extremities. Good ROM, no contractures. Normal muscle tone.  Skin: no rashes, lesions, ulcers. No induration Neurologic: CN 2-12 grossly intact. Sensation intact, DTR normal.  Slight weakness on left side compared to right side Psychiatric: Normal judgment and insight. Alert and  oriented x 3. Normal  mood.    Physical Exam: Vitals:   05/12/22 0031 05/12/22 0458 05/12/22 0929 05/12/22 1529  BP: (!) 111/49 107/65 (!) 113/55 (!) 113/55  Pulse: 70 62 70 87  Resp: '20 20  16  '$ Temp: 97.8 F (36.6 C) 98.3 F (36.8 C)  97.7 F (36.5 C)  TempSrc: Oral Oral  Oral  SpO2: 92% 91% 100% 99%  Weight:      Height:        Data Reviewed:  There are no new results to review at this time.  Family Communication: None at bedside  Disposition: Status is: Inpatient Remains inpatient appropriate because:CHF decompensation requiring IV diuresis  Planned Discharge Destination: Skilled nursing facility    Time spent: 38 minutes  Author: Lequita Halt, MD 05/12/2022 4:05 PM  For on call review [email or web address].

## 2022-05-12 NOTE — TOC Progression Note (Addendum)
Transition of Care [facility]) - Progression Note    Patient Details  Name: Steven Williams MRN: [id number] Date of Birth: 1930/08/25  [facility]) CM/SW [place], [place] Phone Number: 05/12/2022, 1:39 PM  Clinical Narrative:     Update- CSW provided patients daughter with SNF bed offers. Patients daughter wants to look over and will give CSW callback with preferred SNF choice for patient. CSW will continue to follow.  CSW spoke with patient at bedside regarding dc plan. Patients daughter request for CSW to fax out to [place] area for possible SNF placement for patient. CSW informed patient. Patient open to looking to see what other facilities can offer for possible SNF placement.Patient gave CSW permission for his daughter to help assist with his snf choice. CSW will follow up with patient and patients daughter when SNF bed offers are available.CSW awaiting SNF bed offers. CSW will continue to follow and assist with patients dc planning needs.       Expected Discharge Plan and Services                                                 Social Determinants of Health (SDOH) Interventions    Readmission Risk Interventions    04/21/2022    9:52 AM  Readmission Risk Prevention Plan  Transportation Screening Complete  Medication Review ([place]) Complete  PCP or Specialist appointment within 3-5 days of discharge Complete  HRI or [place] Complete  SW Recovery Care/Counseling Consult Complete  Palliative Care Screening Complete  [place] Not Applicable

## 2022-05-12 NOTE — NC FL2 (Addendum)
[place] LEVEL OF CARE SCREENING TOOL     IDENTIFICATION  Patient Name: Steven Williams Birthdate: 1929-11-28 Sex: male Admission Date (Current Location): 05/09/2022  [facility] and [place] Number:  Herbalist and Address:  [facility], [place] [address], [place], [place] [address]      Provider Number: [id number]  Attending Physician Name and Address:  Lequita Halt, MD  Relative Name and Phone Number:  Stanton Kidney (Daughter) [phone]    Current Level of Care: Hospital Recommended Level of Care: [place] Prior Approval Number:    Date Approved/Denied:   PASRR Number: [id number]  Discharge Plan: SNF    Current Diagnoses: Patient Active Problem List   Diagnosis Date Noted   Acute on chronic diastolic CHF (congestive heart failure) ([place]) 05/09/2022   Left inguinal hernia 05/09/2022   Sacral ulcer ([place]) 05/09/2022   PAD (peripheral artery disease) ([place]) 04/24/2022   Cellulitis of left leg 04/18/2022   Acute renal failure superimposed on stage 4 chronic kidney disease ([place]) 04/18/2022   Chronic diastolic CHF (congestive heart failure) ([place]) 04/18/2022   Epistaxis 01/10/2022   DNR (do not resuscitate) 01/09/2022   Chronic venous hypertension (idiopathic) with ulcer and inflammation of bilateral lower extremity ([place]) 10/18/2021   Hypertensive heart and renal disease 10/18/2021   Lumbar radiculopathy 10/18/2021   Non-pressure chronic ulcer of unspecified part of right lower leg with unspecified severity ([place]) 10/18/2021   Unsteady gait 10/18/2021   Transient ischemic attack 10/18/2021   Pain of right hip joint 02/17/2021   Descending thoracic aortic aneurysm ([place]) 10/23/2020   Hypercoagulable state ([place]) 10/23/2020   Preventative health care 10/23/2020   Anticoagulated 06/26/2020   Nasal septal deviation 06/26/2020   Recurrent epistaxis 06/26/2020   Headache 01/19/2020   Localized edema 12/29/2019   Long term  (current) use of anticoagulants 12/25/2019   Dysphagia 07/06/2019   Restless legs syndrome 05/19/2019   Thrombocytopenia ([place]) 11/08/2018   Basal cell carcinoma of scalp 08/26/2018   Atherosclerotic heart disease of native coronary artery without angina pectoris 08/10/2018   CKD (chronic kidney disease) stage 4, GFR 15-29 ml/min (HCC) 07/28/2018   Chronic anemia 07/27/2018   Benign prostatic hyperplasia without lower urinary tract symptoms 07/27/2018   Carotid artery occlusion 07/27/2018   Essential hypertension 07/27/2018   Gout 07/27/2018   Hyperlipidemia 07/27/2018   Hypothyroidism 07/27/2018   Macular degeneration 07/27/2018   Paroxysmal atrial fibrillation ([place]) 07/27/2018   Polyneuropathy 07/27/2018   Rheumatoid arthritis ([place]) 07/27/2018    Orientation RESPIRATION BLADDER Height & Weight     Self, Situation, Place  Normal Incontinent, External catheter (External Urinary Catheter) Weight: 194 lb 7.1 oz (88.2 kg) Height:  '6\' 2"'$  (188 cm)  BEHAVIORAL SYMPTOMS/MOOD NEUROLOGICAL BOWEL NUTRITION STATUS      Incontinent Diet (Please see discharge summary)  AMBULATORY STATUS COMMUNICATION OF NEEDS Skin   Extensive Assist Verbally Other (Comment) (Appropriate for ethnicity,dry,flaky,abrasion,arm,R,L,cleansed,foam,ecchymosis,arm,hip,bilateral,erythema,buttocks,groin,bilateral,erythema,buttocks,groin,bilateral,PI heel,R,deep tissue,foam lift dressing,clean,dry intact,please see additional info)                       Personal Care Assistance Level of Assistance    Bathing Assistance: Maximum assistance Feeding assistance: Independent (able to feed self) Dressing Assistance: Maximum assistance     Functional Limitations Info    Sight Info: Adequate (WDL) Hearing Info: Impaired Speech Info: Adequate    SPECIAL CARE FACTORS FREQUENCY  PT (By licensed PT), OT (By licensed OT)     PT Frequency:  5x min weekly OT Frequency: 5x min weekly            Contractures  Contractures Info: Not present    Additional Factors Info  Code Status, Allergies Code Status Info: DNR Allergies Info: Iodinated Contrast Media           Current Medications (05/12/2022):  This is the current hospital active medication list Current Facility-Administered Medications  Medication Dose Route Frequency Provider Last Rate Last Admin   0.9 %  sodium chloride infusion  250 mL Intravenous PRN Orma Flaming, MD 10 mL/hr at 05/10/22 1120 250 mL at 05/10/22 1120   acetaminophen (TYLENOL) tablet 650 mg  650 mg Oral Q6H PRN Orma Flaming, MD       Or   acetaminophen (TYLENOL) suppository 650 mg  650 mg Rectal Q6H PRN Orma Flaming, MD       allopurinol (ZYLOPRIM) tablet 50 mg  50 mg Oral Auburn Bilberry, MD   50 mg at 05/11/22 1007   aspirin EC tablet 81 mg  81 mg Oral Daily Orma Flaming, MD   81 mg at 05/12/22 0930   atorvastatin (LIPITOR) tablet 40 mg  40 mg Oral Daily Orma Flaming, MD   40 mg at 05/12/22 0930   carvedilol (COREG) tablet 3.125 mg  3.125 mg Oral BID WC Wynetta Fines T, MD       digoxin Fonnie Birkenhead) tablet 0.125 mg  0.125 mg Oral Q M,W,F Orma Flaming, MD   0.125 mg at 05/11/22 1006   enoxaparin (LOVENOX) injection 30 mg  30 mg Subcutaneous Q24H Orma Flaming, MD   30 mg at 05/11/22 2148   ferrous sulfate tablet 325 mg  325 mg Oral Q breakfast Orma Flaming, MD   325 mg at 05/12/22 0930   furosemide (LASIX) injection 40 mg  40 mg Intravenous BID Domenic Polite, MD   40 mg at 05/12/22 2542   gabapentin (NEURONTIN) capsule 300 mg  300 mg Oral BID Orma Flaming, MD   300 mg at 05/12/22 0930   Gerhardt's butt cream   Topical BID Orma Flaming, MD   Given at 05/12/22 7062   Gerhardt's butt cream   Topical Daily PRN Orma Flaming, MD       levalbuterol Penne Lash) nebulizer solution 0.63 mg  0.63 mg Nebulization Q6H PRN Orma Flaming, MD   0.63 mg at 05/09/22 2048   levothyroxine (SYNTHROID) tablet 100 mcg  100 mcg Oral QAC breakfast Orma Flaming, MD   100  mcg at 05/12/22 0930   magnesium oxide (MAG-OX) tablet 800 mg  800 mg Oral BID Wynetta Fines T, MD   800 mg at 05/12/22 0931   multivitamin (PROSIGHT) tablet 1 tablet  1 tablet Oral BID Orma Flaming, MD   1 tablet at 05/12/22 0930   ondansetron (ZOFRAN) injection 4 mg  4 mg Intravenous Q6H PRN Orma Flaming, MD   4 mg at 05/09/22 2056   pantoprazole (PROTONIX) EC tablet 40 mg  40 mg Oral Daily Orma Flaming, MD   40 mg at 05/12/22 0930   polyvinyl alcohol (LIQUIFILM TEARS) 1.4 % ophthalmic solution 1 drop  1 drop Both Eyes QID Orma Flaming, MD   1 drop at 05/12/22 0933   rOPINIRole (REQUIP) tablet 3 mg  3 mg Oral QHS Orma Flaming, MD   3 mg at 05/11/22 2156   saccharomyces boulardii (FLORASTOR) capsule 250 mg  250 mg Oral BID Orma Flaming, MD   250 mg at 05/12/22 3762   senna-docusate (Senokot-S) tablet  1 tablet  1 tablet Oral BID Orma Flaming, MD   1 tablet at 05/12/22 0931   sodium chloride (OCEAN) 0.65 % nasal spray 2 spray  2 spray Each Nare Q4H while awake Orma Flaming, MD   2 spray at 05/12/22 0934   sodium chloride flush (NS) 0.9 % injection 3 mL  3 mL Intravenous Q12H Orma Flaming, MD   3 mL at 05/12/22 0934   sodium chloride flush (NS) 0.9 % injection 3 mL  3 mL Intravenous PRN Orma Flaming, MD       tamsulosin [facility]) capsule 0.4 mg  0.4 mg Oral Daily Orma Flaming, MD   0.4 mg at 05/12/22 0934   traMADol (ULTRAM) tablet 50 mg  50 mg Oral Q8H PRN Orma Flaming, MD   50 mg at 05/12/22 0930     Discharge Medications: Please see discharge summary for a list of discharge medications.  Relevant Imaging Results:  Relevant Lab Results:   Additional Information   [phone] Wound Incision open or dehiced,skin tear,elbow,posterior,R,foam lift dressing,clean,dry,intact,Wound Incision open or dehiced,sacrum,foam lift dressing,clean,dry,intact,erythema,blanchable,wound incision open or dehiced,other,heel,L,Foam lift dressing,erythema,blanchable,wound incision open or  dehiced,other,foot,anterior,L,cellulitis of the foot,gauze,foam lift dressing,clean dry,intact,dressing in place  Milas Gain, LCSWA

## 2022-05-13 ENCOUNTER — Encounter (INDEPENDENT_AMBULATORY_CARE_PROVIDER_SITE_OTHER): Payer: Medicare Other | Admitting: Ophthalmology

## 2022-05-13 DIAGNOSIS — N179 Acute kidney failure, unspecified: Secondary | ICD-10-CM

## 2022-05-13 DIAGNOSIS — H353213 Exudative age-related macular degeneration, right eye, with inactive scar: Secondary | ICD-10-CM

## 2022-05-13 DIAGNOSIS — E66811 Obesity, class 1: Secondary | ICD-10-CM | POA: Diagnosis present

## 2022-05-13 DIAGNOSIS — L98429 Non-pressure chronic ulcer of back with unspecified severity: Secondary | ICD-10-CM

## 2022-05-13 DIAGNOSIS — H04123 Dry eye syndrome of bilateral lacrimal glands: Secondary | ICD-10-CM

## 2022-05-13 DIAGNOSIS — H35033 Hypertensive retinopathy, bilateral: Secondary | ICD-10-CM

## 2022-05-13 DIAGNOSIS — H353221 Exudative age-related macular degeneration, left eye, with active choroidal neovascularization: Secondary | ICD-10-CM

## 2022-05-13 DIAGNOSIS — I480 Paroxysmal atrial fibrillation: Secondary | ICD-10-CM | POA: Diagnosis not present

## 2022-05-13 DIAGNOSIS — I5033 Acute on chronic diastolic (congestive) heart failure: Secondary | ICD-10-CM | POA: Diagnosis not present

## 2022-05-13 DIAGNOSIS — K4090 Unilateral inguinal hernia, without obstruction or gangrene, not specified as recurrent: Secondary | ICD-10-CM | POA: Diagnosis not present

## 2022-05-13 DIAGNOSIS — Z961 Presence of intraocular lens: Secondary | ICD-10-CM

## 2022-05-13 DIAGNOSIS — E785 Hyperlipidemia, unspecified: Secondary | ICD-10-CM

## 2022-05-13 DIAGNOSIS — I10 Essential (primary) hypertension: Secondary | ICD-10-CM

## 2022-05-13 DIAGNOSIS — E669 Obesity, unspecified: Secondary | ICD-10-CM

## 2022-05-13 DIAGNOSIS — D649 Anemia, unspecified: Secondary | ICD-10-CM

## 2022-05-13 DIAGNOSIS — N189 Chronic kidney disease, unspecified: Secondary | ICD-10-CM

## 2022-05-13 LAB — BASIC METABOLIC PANEL
Anion gap: 8 (ref 5–15)
BUN: 43 mg/dL — ABNORMAL HIGH (ref 8–23)
CO2: 23 mmol/L (ref 22–32)
Calcium: 9.7 mg/dL (ref 8.9–10.3)
Chloride: 108 mmol/L (ref 98–111)
Creatinine, Ser: 2.48 mg/dL — ABNORMAL HIGH (ref 0.61–1.24)
GFR, Estimated: 24 mL/min — ABNORMAL LOW (ref >60)
Glucose, Bld: 116 mg/dL — ABNORMAL HIGH (ref 70–99)
Potassium: 3.6 mmol/L (ref 3.5–5.1)
Sodium: 139 mmol/L (ref 135–145)

## 2022-05-13 LAB — CYTOLOGY - NON PAP

## 2022-05-13 MED ORDER — TRAMADOL HCL 50 MG PO TABS
25.0000 mg | ORAL_TABLET | Freq: Two times a day (BID) | ORAL | Status: DC | PRN
  Administered 2022-05-13 – 2022-05-15 (×3): 25 mg via ORAL
  Filled 2022-05-13 (×3): qty 1

## 2022-05-13 MED ORDER — ACETAMINOPHEN 325 MG PO TABS
650.0000 mg | ORAL_TABLET | Freq: Four times a day (QID) | ORAL | Status: DC | PRN
Start: 2022-05-13 — End: 2022-05-15
  Filled 2022-05-13: qty 2

## 2022-05-13 NOTE — Progress Notes (Signed)
Progress Note   Patient: Steven Williams MGQ:[id number] DOB: [birth year] DOA: 05/09/2022     4 DOS: the patient was seen and examined on 05/13/2022   Brief hospital course: Mr. Wong was admitted to the hospital with the working diagnosis of decompensated heart failure.   88 y.o. male symptomatic bradycardia with pacer, chronic diastolic CHF, CKD stage IV, chronic anemia and thrombocytopenia, thoracic aortic aneurysm followed by CT surgery, and paroxysmal atrial fibrillation off of anticoagulation for the past month due to severe epistaxis who presented to Ed with worsening shortness of breath. reported worsening dyspnea for the last 7 days, associated with lethargy and confusion. Patient had orthopnea and lower extremity edema that had mild improvement with outpatient furosemide for 4 days.  Recently admitted 8/12-8/21 for cellulitis, ABIs were abnormal, vascular surgery consulted,  underwent an angiogram of LLE which revealed occluded anterior and posterior tibial arteries, and small vessel disease in the foot and ankle not amenable to revascularization, medical management recommended. At discharge his Lasix was held on account of AKI/CKD 4. On his initial physical examination his blood pressure was 155/64, HR 74, RR 20 and 02 saturation 96^% on room air. His lungs had decreased breath sounds, heart with S1 and S2 present, irregularly irregular, positive systolic murmur, abdomen with no distention, positive lower extremity edema. Left heel with pressure wound.   NA 142, K 4,4 Cl 113 bicarbonate 21, glucose 85, bun 40 cr 2,19  BNP 589 High sensitive troponin 25 and 23  Wbc 7,4 hgb 8,8 plt 188  TSH 4,7  Sars covid 19 negative  Urine analysis SG 1,015, 6-10 wbc   Chest radiograph with cardiomegaly with bilateral hilar vascular congestion and bilateral interstitial infiltrates more at the lower lobes. Bilateral pleural effusions more on right than left with bibasilar atelectasis.   EKG 64 bpm,  normal axis, normal intervals, atrial fibrillation with multifocal PVC, no significant ST segment or T wave changes.  Head CT with no acute changes.  Ct chest with bilateral ground glass opacities, bilateral pleural affusions more right than left with compression atelectasis. Centrilobular and paraseptal emphysema.   Patient was placed on furosemide for diuresis. His volume has improved, he continue very weak and deconditioned, plan to transfer to SNF.    Assessment and Plan: * Acute on chronic diastolic CHF (congestive heart failure) (HCC) Echocardiogram with preserved LV systolic function EF 60 to 65%, moderate LVH, RV systolic function is preserved, moderate dilatation on left and right atrium, moderate to severe Mitral valve regurgitation, mild holosystolic prolapse of both leaflets of the mitral valve. Severe tricuspid valve regurgitation.   Documented urine output is 1,150 ml, over last 3 days urine output has been 7,400 cc.  Systolic blood pressure today is 90 to 100 mmHg.  Plan to hold on diuretic therapy for now.  Continue medical therapy with carvedilol and digoxin.   Acute hypoxemic respiratory failure, right pleural effusion due to cardiogenic pulmonary edema (acute). 09.03 right thoracentesis 1,2 L with good toleration.  Oxygenation today is 96 to 97 on 2 L/min per [place].    Patient rule out for pneumonia and antibiotic therapy has been discontinued.   Left inguinal hernia Known left inguinal hernia from CT abdomen in 2021.   Acute kidney injury superimposed on chronic kidney disease (HCC) Stage 4 CKD. Hypomagnesemia.   Patient with improvement in volume status Renal function today with a serum cr of 2,48, K is 3,6 and serum bicarbonate at 23. Plan to hold on furosemide for now and  follow up renal function in am. Avoid hypotension and nephrotoxic medications.     Paroxysmal atrial fibrillation (HCC) Continue rate control with carvedilol and anticoagulation with  apixaban.   Essential hypertension Continue with carvedilol  Hold on furosemide for now.   Descending thoracic aortic aneurysm (HCC) measuring up to 4.1 cm in diameter in the ascending segment.  surveillance with cardiothoracic surgery   PAD (peripheral artery disease) ([place]) At recent hospitalization he underwent LLE angiogram which showed occlusion of anterior and posterior tibial arteries Continue ASA/statin and monitoring for bleeding with hx of epistaxis.  -some tenderness in left lower leg that feels like phlebitis. On evidence of infection. If continues would have vascular stop by  - LDL 42 from lipid panel 8/17 - follow up with [facility] in 1 month and podiatry   Hyperlipidemia Continue lipitor   Hypothyroidism Continue with levothyroxine 100 mcg daily TSh mild elevation at 4,79 on admission.   Chronic anemia Anemia of chronic disease with iron deficiency Hgb has been stable at 8,7 Continue oral iron supplementation for iron deficiency.   Sacral ulcer ([place]) Chronic appearing Continue wound care, prevent pressure, out of bed to chair tid with meals While in bed with pressure boots in place.  Continue pain control with tramadol, will reduce dose to 25 mg to prevent oversedation.  Class 1 obesity Calculated BMI is 34,1.         Subjective: Patient with no chest pain or dyspnea, today very weak and deconditioned, slow speech   Physical Exam: Vitals:   05/12/22 1943 05/13/22 0406 05/13/22 0836 05/13/22 0930  BP: (!) 96/54  104/62 (!) 95/45  Pulse: 73  78 63  Resp:      Temp: 97.6 F (36.4 C) 98.1 F (36.7 C)    TempSrc: Oral Oral    SpO2: 95%  96% 97%  Weight:  120.7 kg    Height:       Neurology awake and alert, mild somnolence, slow speech, with no confusion or agitation  ENT with mild pallor Cardiovascular with S1 and S2 present and regular, positive systolic murmur at the apex and left lower sternal border.  No JVD No lower extremity edema Respiratory  with no rales or wheezing, with no rhonchi  Abdomen with no distention  Data Reviewed:    Family Communication: I spoke with patient's daughter at the bedside, we talked in detail about patient's condition, plan of care and prognosis and all questions were addressed.   Disposition: Status is: Inpatient Remains inpatient appropriate because: heart failure and renal failure   Planned Discharge Destination: Skilled nursing facility      Author: Tawni Millers, MD 05/13/2022 11:58 AM  For on call review [email or web address].

## 2022-05-13 NOTE — TOC Progression Note (Addendum)
Transition of Care [facility]) - Progression Note    Patient Details  Name: Steven Williams MRN: [id number] Date of Birth: March 29, 1930  Transition of Care [facility]) CM/SW [place], [place] Phone Number: 05/13/2022, 12:23 PM  Clinical Narrative:       CSW received callback from [facility] patients daughter. Patients daughter informed CSW still undecided on SNF choice. Patients daughter awaiting for [facility] to reach out regarding some questions about facility that will help with final decision on SNF choice. CSW called [place]. CSW awaiting callback to see if she can reach out to assist with additional questions. CSW updated patient at bedside.CSW will continue to follow and assist with patients dc planning needs.   Update-1:54pm- CSW spoke with Perrin Smack who confirmed she will give patients daughter a call.  CSW LVM for patients daughter Stanton Kidney. CSW awaiting callback to discuss SNF bed choice for patient. CSW will continue to follow and assist with patients dc planning needs.      Expected Discharge Plan and Services                                                 Social Determinants of Health (SDOH) Interventions    Readmission Risk Interventions    04/21/2022    9:52 AM  Readmission Risk Prevention Plan  Transportation Screening Complete  Medication Review ([place]) Complete  PCP or Specialist appointment within 3-5 days of discharge Complete  HRI or [place] Complete  SW Recovery Care/Counseling Consult Complete  Palliative Care Screening Complete  [place] Not Applicable

## 2022-05-13 NOTE — Assessment & Plan Note (Signed)
Calculated BMI is 34,1.

## 2022-05-13 NOTE — Progress Notes (Signed)
Mobility Specialist Criteria Algorithm Info.   05/13/22 0930  Therapy Vitals  Pulse Rate 63  BP (!) 95/45  Patient Position (if appropriate) Sitting  Oxygen Therapy  SpO2 97 %  O2 Device Nasal Cannula  Mobility  Activity Transferred from bed to chair  Range of Motion/Exercises Active;All extremities  Level of Assistance Contact guard assist, steadying assist  Assistive Device Stedy  Activity Response Tolerated well   Patient received in supine eager agreeable to participate in mobility. Pt stated he felt better and more energized today. Required min A to EOB more so assisting trunk from sidelying to sitting. Of note, patient with decrease in BP upon dangling EOB was 83/43 but asymptomatic. Was still eager to stand, stating that his BP typically trends lower. Was able to stand from elevated bed height utilizing Stedy without any physical assistance. Transferred to recliner without incident and completed LE exercises. Monitored BP throughout in which it lingered between mid 80's to low 53'U systolic and mid 02'B diastolic. Became increasingly lethargic but easily aroused and answered questions appropriately. Final BP took was 95/45. RN notified. Was left in recliner with all needs met, call bell in reach.   Steven Williams, [place], [place]  XIDHW:[phone] Office: [phone]

## 2022-05-13 NOTE — Care Management Important Message (Signed)
Important Message  Patient Details  Name: Steven Williams MRN: [id number] Date of Birth: 1930/03/05   Medicare Important Message Given:  Yes     Shelda Altes 05/13/2022, 8:24 AM

## 2022-05-14 DIAGNOSIS — I480 Paroxysmal atrial fibrillation: Secondary | ICD-10-CM | POA: Diagnosis not present

## 2022-05-14 DIAGNOSIS — N179 Acute kidney failure, unspecified: Secondary | ICD-10-CM | POA: Diagnosis not present

## 2022-05-14 DIAGNOSIS — K4090 Unilateral inguinal hernia, without obstruction or gangrene, not specified as recurrent: Secondary | ICD-10-CM | POA: Diagnosis not present

## 2022-05-14 DIAGNOSIS — I5033 Acute on chronic diastolic (congestive) heart failure: Secondary | ICD-10-CM | POA: Diagnosis not present

## 2022-05-14 LAB — BASIC METABOLIC PANEL
Anion gap: 10 (ref 5–15)
BUN: 43 mg/dL — ABNORMAL HIGH (ref 8–23)
CO2: 25 mmol/L (ref 22–32)
Calcium: 9.9 mg/dL (ref 8.9–10.3)
Chloride: 102 mmol/L (ref 98–111)
Creatinine, Ser: 2.35 mg/dL — ABNORMAL HIGH (ref 0.61–1.24)
GFR, Estimated: 26 mL/min — ABNORMAL LOW (ref >60)
Glucose, Bld: 110 mg/dL — ABNORMAL HIGH (ref 70–99)
Potassium: 3.6 mmol/L (ref 3.5–5.1)
Sodium: 137 mmol/L (ref 135–145)

## 2022-05-14 LAB — MAGNESIUM: Magnesium: 1.7 mg/dL (ref 1.7–2.4)

## 2022-05-14 MED ORDER — MAGNESIUM SULFATE 2 GM/50ML IV SOLN
2.0000 g | Freq: Once | INTRAVENOUS | Status: AC
  Administered 2022-05-14: 2 g via INTRAVENOUS
  Filled 2022-05-14: qty 50

## 2022-05-14 NOTE — Progress Notes (Signed)
Progress Note   Patient: Steven Williams IOE:[id number] DOB: [birth year] DOA: 05/09/2022     5 DOS: the patient was seen and examined on 05/14/2022   Brief hospital course: Mr. Steven Williams was admitted to the hospital with the working diagnosis of decompensated heart failure.   88 y.o. male symptomatic bradycardia with pacer, chronic diastolic CHF, CKD stage IV, chronic anemia and thrombocytopenia, thoracic aortic aneurysm followed by CT surgery, and paroxysmal atrial fibrillation off of anticoagulation for the past month due to severe epistaxis who presented to [facility] with worsening shortness of breath. reported worsening dyspnea for the last 7 days, associated with lethargy and confusion. Patient had orthopnea and lower extremity edema that had mild improvement with outpatient furosemide for 4 days.  Recently admitted 8/12-8/21 for cellulitis, ABIs were abnormal, vascular surgery consulted,  underwent an angiogram of LLE which revealed occluded anterior and posterior tibial arteries, and small vessel disease in the foot and ankle not amenable to revascularization, medical management recommended. At discharge his Lasix was held on account of AKI/CKD 4. On his initial physical examination his blood pressure was 155/64, HR 74, RR 20 and 02 saturation 96^% on room air. His lungs had decreased breath sounds, heart with S1 and S2 present, irregularly irregular, positive systolic murmur, abdomen with no distention, positive lower extremity edema. Left heel with pressure wound.   NA 142, K 4,4 Cl 113 bicarbonate 21, glucose 85, bun 40 cr 2,19  BNP 589 High sensitive troponin 25 and 23  Wbc 7,4 hgb 8,8 plt 188  TSH 4,7  Sars covid 19 negative  Urine analysis SG 1,015, 6-10 wbc   Chest radiograph with cardiomegaly with bilateral hilar vascular congestion and bilateral interstitial infiltrates more at the lower lobes. Bilateral pleural effusions more on right than left with bibasilar atelectasis.   EKG 64 bpm,  normal axis, normal intervals, atrial fibrillation with multifocal PVC, no significant ST segment or T wave changes.  Head CT with no acute changes.  Ct chest with bilateral ground glass opacities, bilateral pleural affusions more right than left with compression atelectasis. Centrilobular and paraseptal emphysema.   Patient was placed on furosemide for diuresis. His volume has improved, he continue very weak and deconditioned, plan to transfer to SNF.    Assessment and Plan: * Acute on chronic diastolic CHF (congestive heart failure) (HCC) Echocardiogram with preserved LV systolic function EF 60 to 65%, moderate LVH, RV systolic function is preserved, moderate dilatation on left and right atrium, moderate to severe Mitral valve regurgitation, mild holosystolic prolapse of both leaflets of the mitral valve. Severe tricuspid valve regurgitation.   Patient with improvement in his volume status.  His blood pressure is better at 100 mmHg.  Mentation has improved.   Continue medical therapy with carvedilol and digoxin.  Plan to hold on furosemide for today and resume at the time of discharge.  Will need close follow up at volume status as outpatient.   Acute hypoxemic respiratory failure, right pleural effusion due to cardiogenic pulmonary edema (acute). 09.03 right thoracentesis 1,2 L with good toleration.  Oxygenation today is 96 to 97 on 2 L/min per [place].    Patient rule out for pneumonia and antibiotic therapy has been discontinued.   Left inguinal hernia Known left inguinal hernia from CT abdomen in 2021.   Acute kidney injury superimposed on chronic kidney disease (HCC) Stage 4 CKD. Hypomagnesemia.   Diuresis is on hold, renal function has improved with serum cr at 2,35 with K at 3,6 and serum  bicarbonate at 25. Mg 1.7   Add 2 g mag sulfate today.   Plan to continue close follow up on renal function and electrolytes Hold on furosemide for now.  Avoid hypotension and nephrotoxic  medications.     Paroxysmal atrial fibrillation (HCC) Continue rate control with carvedilol and anticoagulation with apixaban.   Essential hypertension Continue with carvedilol  His blood pressure is more stable today, continue to hold on furosemide for now.   Descending thoracic aortic aneurysm (HCC) measuring up to 4.1 cm in diameter in the ascending segment.  surveillance with cardiothoracic surgery   PAD (peripheral artery disease) ([place]) At recent hospitalization he underwent LLE angiogram which showed occlusion of anterior and posterior tibial arteries Continue ASA/statin and monitoring for bleeding with hx of epistaxis.  -some tenderness in left lower leg that feels like phlebitis. On evidence of infection. If continues would have vascular stop by  - LDL 42 from lipid panel 8/17 - follow up with [facility] in 1 month and podiatry   Hyperlipidemia Continue lipitor   Hypothyroidism Continue with levothyroxine 100 mcg daily TSh mild elevation at 4,79 on admission.   Chronic anemia Anemia of chronic disease with iron deficiency Hgb has been stable at 8,7 Continue oral iron supplementation for iron deficiency.   Sacral ulcer ([place]) Chronic appearing Continue wound care, prevent pressure, out of bed to chair tid with meals While in bed with pressure boots in place.  Continue pain control with tramadol, will reduce dose to 25 mg to prevent oversedation.  Right heel pressure ulcer.(present on admission, not able to stage).  Continue local wound care.   Class 1 obesity Calculated BMI is 34,1.         Subjective: Patient is more awake and alert, his tolerating po well, no nausea or vomiting.   Physical Exam: Vitals:   05/13/22 1909 05/13/22 2333 05/14/22 0609 05/14/22 0800  BP: (!) 112/59 137/61 (!) 102/59 102/60  Pulse: 63 69 64 70  Resp: '18 18 18 18  '$ Temp: 97.7 F (36.5 C) 97.7 F (36.5 C) (!) 97.3 F (36.3 C) (!) 97.3 F (36.3 C)  TempSrc: Oral Oral Oral Oral   SpO2: 97%  100% 97%  Weight:      Height:       Neurology awake and alert ENT with mild pallor Cardiovascular with S1 and S2 present and irregular, positive murmur at the apex systolic No JVD No lower extremity edema Respiratory with no rales or wheezing Abdomen with no distention  Data Reviewed:    Family Communication: I spoke with patient's son at the bedside, we talked in detail about patient's condition, plan of care and prognosis and all questions were addressed.   Disposition: Status is: Inpatient Remains inpatient appropriate because: renal function is more stable, plan to transfer to SNF tomorrow morning   Planned Discharge Destination: Skilled nursing facility      Author: Tawni Millers, MD 05/14/2022 9:53 AM  For on call review [email or web address].

## 2022-05-14 NOTE — TOC Progression Note (Signed)
Transition of Care [facility]) - Progression Note    Patient Details  Name: Steven Williams MRN: [id number] Date of Birth: [birth year]  Transition [facility]) CM/SW [place], [place] Phone Number: 05/14/2022, 9:55 AM  Clinical Narrative:     CSW spoke with patients daughter Stanton Kidney who accepted SNF bed offer with [facility]. CSW awaiting callback from [facility] to confirm SNF bed for patient. CSW will continue to follow and assist with patients dc planning needs.       Expected Discharge Plan and Services                                                 Social Determinants of Health (SDOH) Interventions    Readmission Risk Interventions    04/21/2022    9:52 AM  Readmission Risk Prevention Plan  Transportation Screening Complete  Medication Review ([place]) Complete  PCP or Specialist appointment within 3-5 days of discharge Complete  HRI or [place] Complete  SW Recovery Care/Counseling Consult Complete  Palliative Care Screening Complete  [place] Not Applicable

## 2022-05-14 NOTE — Progress Notes (Signed)
Physical Therapy Treatment Patient Details Name: Steven Williams MRN: [id number] DOB: 1930/01/07 Today's Date: 05/14/2022   History of Present Illness Pt adm 9/2 from SNF with SOB. Pt with acute on chronic diastolic chf and rt pleural effusion. Rt thoracentesis on 9/3.  PMH - AAA; BPH; COPD; HTN; pacer, hypothyroidism; MDS; afib on Coumadin; recurrent epistaxis;    PT Comments    Pt making steady progress with mobility and standing improved. [place] next session can stand with walker and begin ambulation.    Recommendations for follow up therapy are one component of a multi-disciplinary discharge planning process, led by the attending physician.  Recommendations may be updated based on patient status, additional functional criteria and insurance authorization.  Follow Up Recommendations  Skilled nursing-short term rehab (<3 hours/day) Can patient physically be transported by private vehicle: No   Assistance Recommended at Discharge Frequent or constant Supervision/Assistance  Patient can return home with the following Two people to help with walking and/or transfers;A lot of help with bathing/dressing/bathroom;Assist for transportation;Direct supervision/assist for medications management;Assistance with cooking/housework   Equipment Recommendations  Wheelchair (measurements PT);Wheelchair cushion (measurements PT)    Recommendations for Other Services       Precautions / Restrictions Precautions Precautions: Fall     Mobility  Bed Mobility Overal bed mobility: Needs Assistance Bed Mobility: Supine to Sit     Supine to sit: Mod assist, HOB elevated     General bed mobility comments: Assist to elevate trunk into sitting and bring hips to EOB.    Transfers Overall transfer level: Needs assistance Equipment used: Ambulation equipment used Transfers: Sit to/from Stand, Bed to chair/wheelchair/BSC Sit to Stand: Mod assist, From elevated surface, Min assist            General transfer comment: Min assist to stand from elevated bed and from seat of Stedy. Mod assist to stand from low recliner. Transfer via Lift Equipment: Stedy  Ambulation/Gait             Pre-gait activities: Stood x 6 in [place] for 30 sec to 2 minutes. Able to shift weight and step in place. Verbal/tactile cues to extend trunk and hips.     Stairs             Wheelchair Mobility    Modified Rankin (Stroke Patients Only)       Balance Overall balance assessment: Needs assistance Sitting-balance support: Feet supported, No upper extremity supported Sitting balance-Leahy Scale: Fair     Standing balance support: Bilateral upper extremity supported Standing balance-Leahy Scale: Poor Standing balance comment: Stedy and min guard assist for static standing                            Cognition Arousal/Alertness: Lethargic, Awake/alert Behavior During Therapy: WFL for tasks assessed/performed Overall Cognitive Status: Impaired/Different from baseline Area of Impairment: Problem solving, Following commands                       Following Commands: Follows one step commands with increased time     Problem Solving: Slow processing, Requires verbal cues General Comments: Initally pt needed incr time and verbal cues but as became more awake this improved.        Exercises      General Comments General comments (skin integrity, edema, etc.): VSS on 3L      Pertinent Vitals/Pain Pain Assessment Pain Assessment: Faces Faces Pain Scale: No hurt  Home Living                          Prior Function            PT Goals (current goals can now be found in the care plan section) Acute Rehab PT Goals Patient Stated Goal: not stated Progress towards PT goals: Progressing toward goals    Frequency    Min 2X/week      PT Plan Current plan remains appropriate    Co-evaluation              AM-PAC PT "6 Clicks"  Mobility   Outcome Measure  Help needed turning from your back to your side while in a flat bed without using bedrails?: A Lot Help needed moving from lying on your back to sitting on the side of a flat bed without using bedrails?: A Lot Help needed moving to and from a bed to a chair (including a wheelchair)?: Total Help needed standing up from a chair using your arms (e.g., wheelchair or bedside chair)?: A Lot Help needed to walk in hospital room?: Total Help needed climbing 3-5 steps with a railing? : Total 6 Click Score: 9    End of Session Equipment Utilized During Treatment: Gait belt Activity Tolerance: Patient tolerated treatment well Patient left: in chair;with call bell/phone within reach;with chair alarm set;with family/visitor present Nurse Communication: Mobility status;Need for lift equipment PT Visit Diagnosis: Other abnormalities of gait and mobility (R26.89);Muscle weakness (generalized) (M62.81)     Time: 4818-5631 PT Time Calculation (min) (ACUTE ONLY): 42 min  Charges:  $Therapeutic Activity: 38-52 mins                     [place] 05/14/2022, 4:54 PM

## 2022-05-15 ENCOUNTER — Other Ambulatory Visit: Payer: Self-pay | Admitting: *Deleted

## 2022-05-15 DIAGNOSIS — I5033 Acute on chronic diastolic (congestive) heart failure: Secondary | ICD-10-CM | POA: Diagnosis not present

## 2022-05-15 DIAGNOSIS — K4090 Unilateral inguinal hernia, without obstruction or gangrene, not specified as recurrent: Secondary | ICD-10-CM | POA: Diagnosis not present

## 2022-05-15 DIAGNOSIS — N179 Acute kidney failure, unspecified: Secondary | ICD-10-CM | POA: Diagnosis not present

## 2022-05-15 DIAGNOSIS — I739 Peripheral vascular disease, unspecified: Secondary | ICD-10-CM

## 2022-05-15 DIAGNOSIS — I480 Paroxysmal atrial fibrillation: Secondary | ICD-10-CM | POA: Diagnosis not present

## 2022-05-15 LAB — BASIC METABOLIC PANEL
Anion gap: 9 (ref 5–15)
BUN: 43 mg/dL — ABNORMAL HIGH (ref 8–23)
CO2: 24 mmol/L (ref 22–32)
Calcium: 10.2 mg/dL (ref 8.9–10.3)
Chloride: 104 mmol/L (ref 98–111)
Creatinine, Ser: 2.29 mg/dL — ABNORMAL HIGH (ref 0.61–1.24)
GFR, Estimated: 26 mL/min — ABNORMAL LOW (ref >60)
Glucose, Bld: 106 mg/dL — ABNORMAL HIGH (ref 70–99)
Potassium: 3.8 mmol/L (ref 3.5–5.1)
Sodium: 137 mmol/L (ref 135–145)

## 2022-05-15 LAB — CULTURE, BODY FLUID W GRAM STAIN -BOTTLE: Culture: NO GROWTH

## 2022-05-15 MED ORDER — TRAMADOL HCL 50 MG PO TABS: 25.0000 mg | ORAL_TABLET | Freq: Two times a day (BID) | ORAL | 0 refills | Status: DC | PRN

## 2022-05-15 MED ORDER — ACETAMINOPHEN 325 MG PO TABS: 650.0000 mg | ORAL_TABLET | Freq: Four times a day (QID) | ORAL | Status: DC | PRN

## 2022-05-15 MED ORDER — FUROSEMIDE 40 MG PO TABS: 40.0000 mg | ORAL_TABLET | Freq: Every day | ORAL | Status: DC

## 2022-05-15 MED ORDER — FUROSEMIDE 40 MG PO TABS: 40.0000 mg | ORAL_TABLET | Freq: Every day | ORAL | 0 refills | Status: DC

## 2022-05-15 MED ORDER — CARVEDILOL 3.125 MG PO TABS: 3.1250 mg | ORAL_TABLET | Freq: Two times a day (BID) | ORAL | 0 refills | Status: DC

## 2022-05-15 NOTE — TOC Transition Note (Signed)
Transition of Care [facility]) - CM/SW Discharge Note   Patient Details  Name: Steven Williams MRN: [id number] Date of Birth: Nov 14, 1929  [facility]) CM/SW Contact:  Milas Gain, [place] Phone Number: 05/15/2022, 11:13 AM   Clinical Narrative:     Patient will DC to: [facility]  Anticipated DC date: 05/15/2022  Family notified: [name]  Transport by: Corey Harold  ?  Per MD patient ready for DC to [facility]. RN, patient, patient's family, and facility notified of DC. Discharge Summary sent to facility. RN given number for report tele# [phone] for [facility] RM#[id number]. DC packet on chart. DNR signed by MD attached to patients Ambulance transport requested for patient.  CSW signing off.   Final next level of care: Skilled Nursing Facility Barriers to Discharge: No Barriers Identified   Patient Goals and CMS Choice Patient states their goals for this hospitalization and ongoing recovery are:: SNF CMS Medicare.gov Compare Post Acute Care list provided to:: Patient (patient and patients daughter Stanton Kidney) Choice offered to / list presented to : Patient, Adult Children  Discharge Placement              Patient chooses bed at: [facility] Patient to be transferred to facility by: Door Name of family member notified: [name] Patient and family notified of of transfer: 05/15/22  Discharge Plan and Services                                     Social Determinants of Health (SDOH) Interventions     Readmission Risk Interventions    04/21/2022    9:52 AM  Readmission Risk Prevention Plan  Transportation Screening Complete  Medication Review ([place]) Complete  PCP or Specialist appointment within 3-5 days of discharge Complete  HRI or [place] Complete  SW Recovery Care/Counseling Consult Complete  Palliative Care Screening Complete  [place] Not Applicable

## 2022-05-15 NOTE — Progress Notes (Signed)
Report given to [facility] LPN at [facility] [phone].

## 2022-05-15 NOTE — Progress Notes (Signed)
Called [phone] for report, no answer. Will try again shortly.

## 2022-05-15 NOTE — Discharge Summary (Addendum)
Physician Discharge Summary   Patient: Kip Cropp MRN: [id number] DOB: 1930-01-19  Admit date:     05/09/2022  Discharge date: 05/15/22  Discharge Physician: Jimmy Picket Nak[place]   PCP: Ginger Organ., MD   Recommendations at discharge:    Patient has been placed on furosemide for diuresis, take 40 mg daily. Increase to 40 mg bid in case of weight gain 2 to 3 Lbs in 24 hrs or 5 lbs on 7 days.  Decreased carvedilol to 3.125 to prevent hypotension.  Follow up renal function in 7 days Follow up with Dr Brigitte Pulse in 7 to 10 days.  Follow with Ophthalmology for macular degeneration as scheduled. Patient has been on treatment in the past.    I spoke with patient's son at the bedside, we talked in detail about patient's condition, plan of care and prognosis and all questions were addressed.   Discharge Diagnoses: Principal Problem:   Acute on chronic diastolic CHF (congestive heart failure) (HCC) Active Problems:   Acute kidney injury superimposed on chronic kidney disease (HCC)   Left inguinal hernia   Paroxysmal atrial fibrillation (HCC)   Essential hypertension   Descending thoracic aortic aneurysm (HCC)   PAD (peripheral artery disease) (HCC)   Hyperlipidemia   Hypothyroidism   Chronic anemia   Sacral ulcer ([place])   Class 1 obesity  Resolved Problems:   * No resolved hospital problems. [facility] Course: Mr. Deremer was admitted to the hospital with the working diagnosis of decompensated heart failure.   88 y.o. male symptomatic bradycardia with pacer, chronic diastolic CHF, CKD stage IV, chronic anemia and thrombocytopenia, thoracic aortic aneurysm followed by CT surgery, and paroxysmal atrial fibrillation off of anticoagulation for the past month due to severe epistaxis who presented to Ed with worsening shortness of breath. reported worsening dyspnea for the last 7 days, associated with lethargy and confusion. Patient had orthopnea and lower extremity edema that had mild  improvement with outpatient furosemide for 4 days.  Recently admitted 8/12-8/21 for cellulitis, ABIs were abnormal, vascular surgery consulted,  underwent an angiogram of LLE which revealed occluded anterior and posterior tibial arteries, and small vessel disease in the foot and ankle not amenable to revascularization, medical management recommended. At discharge his Lasix was held on account of AKI/CKD 4. On his initial physical examination his blood pressure was 155/64, HR 74, RR 20 and 02 saturation 96^% on room air. His lungs had decreased breath sounds, heart with S1 and S2 present, irregularly irregular, positive systolic murmur, abdomen with no distention, positive lower extremity edema. Left heel with pressure wound.   NA 142, K 4,4 Cl 113 bicarbonate 21, glucose 85, bun 40 cr 2,19  BNP 589 High sensitive troponin 25 and 23  Wbc 7,4 hgb 8,8 plt 188  TSH 4,7  Sars covid 19 negative  Urine analysis SG 1,015, 6-10 wbc   Chest radiograph with cardiomegaly with bilateral hilar vascular congestion and bilateral interstitial infiltrates more at the lower lobes. Bilateral pleural effusions more on right than left with bibasilar atelectasis.   EKG 64 bpm, normal axis, normal intervals, atrial fibrillation with multifocal PVC, no significant ST segment or T wave changes.  Head CT with no acute changes.  Ct chest with bilateral ground glass opacities, bilateral pleural affusions more right than left with compression atelectasis. Centrilobular and paraseptal emphysema.   Patient was placed on furosemide for diuresis. His volume has improved, he continue very weak and deconditioned, plan to transfer to SNF.  Assessment and Plan: * Acute on chronic diastolic CHF (congestive heart failure) (HCC) Echocardiogram with preserved LV systolic function EF 60 to 65%, moderate LVH, RV systolic function is preserved, moderate dilatation on left and right atrium, moderate to severe Mitral valve  regurgitation, mild holosystolic prolapse of both leaflets of the mitral valve. Severe tricuspid valve regurgitation.   Patient was admitted to the cardiac ward, he was placed on IV furosemide for diuresis, negative fluid balance was achieved, with significant improvement in his symptoms.   At the time of his discharge he will continue with carvedilol and digoxin. Resume furosemide 40 mg daily and instructions to increase in case of volume overload.  No RAS inhibition due to risk of worsening renal function.  Hold on SGLT 2 inh for now until renal function more stable.   Systolic blood pressure is 90 to 120 mmHg.   Acute hypoxemic respiratory failure, right pleural effusion due to cardiogenic pulmonary edema (acute). 09.03 right thoracentesis 1,2 L with good toleration.  At the time of his discharge his 02 saturation is 94% on room air.    Patient ruled out for pneumonia and antibiotic therapy has been discontinued.   Left inguinal hernia Known left inguinal hernia from CT abdomen in 2021.   Acute kidney injury superimposed on chronic kidney disease (HCC) Stage 4 CKD. Hypomagnesemia.   His hospitalization was prolonged due to acute kidney injury, furosemide was held and renal function is now improving. At the time of his discharge his renal function has a serum cr of 2.29 with K at 3,8 and serum bicarbonate at 24.   Electrolytes were corrected. Plan to resume furosemide on 09.09.23, with 40 mg daily, increase to bid in case of volume overload.   Paroxysmal atrial fibrillation (HCC) Continue rate control with carvedilol and anticoagulation with apixaban.   Essential hypertension Continue with carvedilol  His blood pressure is more stable today, continue to hold on furosemide for now.   Descending thoracic aortic aneurysm (HCC) measuring up to 4.1 cm in diameter in the ascending segment.  surveillance with cardiothoracic surgery   PAD (peripheral artery disease) ([place]) At  recent hospitalization he underwent LLE angiogram which showed occlusion of anterior and posterior tibial arteries  Plan to continue with aspirin, continue with statin therapy.    Hyperlipidemia Continue with statin therapy.   Hypothyroidism Continue with levothyroxine 100 mcg daily TSh mild elevation at 4,79 on admission.   Chronic anemia Anemia of chronic disease with iron deficiency Hgb has been stable at 8,7 Continue oral iron supplementation for iron deficiency.   Sacral ulcer ([place]) Chronic appearing Continue wound care, prevent pressure, out of bed to chair tid with meals While in bed with pressure boots in place.  Continue pain control with tramadol, will reduce dose to 25 mg to prevent oversedation.  Right heel pressure ulcer.(present on admission, not able to stage).  Continue local wound care.   Class 1 obesity Calculated BMI is 34,1.          Consultants: none  Procedures performed: none   Disposition: Skilled nursing facility Diet recommendation:  Cardiac diet DISCHARGE MEDICATION: Allergies as of 05/15/2022       Reactions   Iodinated Contrast Media Rash        Medication List     STOP taking these medications    azithromycin 250 MG tablet Commonly known as: ZITHROMAX   cephALEXin 500 MG capsule Commonly known as: KEFLEX   gabapentin 300 MG capsule Commonly known as: NEURONTIN  oxyCODONE-acetaminophen 5-325 MG tablet Commonly known as: PERCOCET/ROXICET       TAKE these medications    acetaminophen 325 MG tablet Commonly known as: TYLENOL Take 2 tablets (650 mg total) by mouth every 6 (six) hours as needed for moderate pain or mild pain (or Fever >/= 101). What changed:  medication strength how much to take reasons to take this   allopurinol 100 MG tablet Commonly known as: ZYLOPRIM Take 0.5 tablets (50 mg total) by mouth every other day.   aspirin EC 81 MG tablet Take 1 tablet (81 mg total) by mouth daily. Swallow  whole.   atorvastatin 40 MG tablet Commonly known as: LIPITOR Take 1 tablet (40 mg total) by mouth daily.   carvedilol 3.125 MG tablet Commonly known as: COREG Take 1 tablet (3.125 mg total) by mouth 2 (two) times daily with a meal. What changed:  medication strength how much to take   cholecalciferol 25 MCG (1000 UNIT) tablet Commonly known as: VITAMIN D3 Take 2,000 Units by mouth daily.   cyanocobalamin 1000 MCG tablet Commonly known as: VITAMIN B12 Take 1 tablet (1,000 mcg total) by mouth daily.   digoxin 0.125 MG tablet Commonly known as: LANOXIN Take 0.125 mg by mouth every Monday, Wednesday, and Friday.   furosemide 40 MG tablet Commonly known as: LASIX Take 1 tablet (40 mg total) by mouth daily. Start taking on: May 16, 2022 What changed:  medication strength how much to take   hydrocortisone 2.5 % rectal cream Commonly known as: ANUSOL-HC Apply 1 application topically 2 (two) times daily as needed for hemorrhoids or anal itching.   Iron 325 (65 Fe) MG Tabs Take 1 tablet (325 mg total) by mouth daily with breakfast.   LACTATED RINGERS IV Inject into the vein. 75 ml/1 hr over 12 hours   levothyroxine 100 MCG tablet Commonly known as: SYNTHROID Take 100 mcg by mouth daily before breakfast.   pantoprazole 40 MG tablet Commonly known as: PROTONIX Take 40 mg by mouth daily.   PRESERVISION AREDS PO Take 1 capsule by mouth 2 (two) times daily.   REFRESH OP Place 1 drop into both eyes 4 (four) times daily.   rOPINIRole 1 MG tablet Commonly known as: REQUIP Take 3 tablets (3 mg total) by mouth at bedtime.   saccharomyces boulardii 250 MG capsule Commonly known as: FLORASTOR Take 250 mg by mouth 2 (two) times daily. For 14 days starting 05/08/22   saw palmetto 500 MG capsule Take 500 mg by mouth daily.   Senexon-S 8.6-50 MG tablet Generic drug: senna-docusate Take 1 tablet by mouth 2 (two) times daily.   sodium chloride 0.65 % Soln nasal  spray Commonly known as: OCEAN Place 2 sprays into both nostrils every 4 (four) hours while awake.   tamsulosin 0.4 MG Caps capsule Commonly known as: FLOMAX Take 0.4 mg by mouth daily.   traMADol 50 MG tablet Commonly known as: ULTRAM Take 0.5 tablets (25 mg total) by mouth every 12 (twelve) hours as needed for severe pain. What changed:  how much to take when to take this reasons to take this               Discharge Care Instructions  (From admission, onward)           Start     Ordered   05/15/22 0000  Discharge wound care:       Comments: Bilateral heel deep tissue pressure injuries. Cleanse with NS pat dry. Apply xeroform gauze  in a folded layer Karin Golden [id number]), top with dry gauze, secure with Kerlix roll gauze/paper tape. Change daily. Place feet into [employer].   05/15/22 0928            Discharge Exam: Filed Weights   05/11/22 0533 05/13/22 0406 05/15/22 0627  Weight: 88.2 kg 120.7 kg 118.8 kg   BP 129/62 (BP Location: Left Arm)   Pulse (!) 58   Temp (!) 97.5 F (36.4 C) (Oral)   Resp 18   Ht _0  (1.88 m)   Wt 118.8 kg   SpO2 97%   BMI 33.64 kg/m   Patient is feeling well, no dyspnea or edema, heel pain is controlled with analgesics  Neurology awake and alert ENT with mild pallor Cardiovascular with S1 and S2 present with no gallops, positive murmur at the apex Respiratory with no wheezing or rales Abdomen with no distention  No lower extremity edema Right heal ulcer with dressing in place.   Condition at discharge: stable  The results of significant diagnostics from this hospitalization (including imaging, microbiology, ancillary and laboratory) are listed below for reference.   Imaging Studies: ECHOCARDIOGRAM COMPLETE  Result Date: 05/10/2022    ECHOCARDIOGRAM REPORT   Patient Name:   RAHM MINIX Date of Exam: 05/10/2022 Medical Rec #:  [id number]     Height:       74.0 in Accession #:    [id number]    Weight:       189.4 lb  Date of Birth:  03/07/1930     BSA:          2.124 m Patient Age:    42 years      BP:           97/46 mmHg Patient Gender: M             HR:           68 bpm. Exam Location:  Inpatient Procedure: 2D Echo, Cardiac Doppler and Color Doppler Indications:    CHF-acute diastolic  History:        Patient has prior history of Echocardiogram examinations, most                 recent 09/12/2018. CHF, Pacemaker, Arrythmias:Atrial Fibrillation;                 Risk Factors:Hypertension and Dyslipidemia. MAC. CKD. MVP.  Sonographer:    Clayton Lefort RDCS (AE) Referring Phys: [id number] [place]  1. Left ventricular ejection fraction, by estimation, is 60 to 65%. The left ventricle has normal function. The left ventricle has no regional wall motion abnormalities. There is moderate left ventricular hypertrophy. Left ventricular diastolic parameters are indeterminate.  2. Right ventricular systolic function is normal. The right ventricular size is normal. There is mildly elevated pulmonary artery systolic pressure.  3. Left atrial size was moderately dilated.  4. Right atrial size was moderately dilated.  5. The mitral valve is myxomatous. Moderate to severe mitral valve regurgitation. No evidence of mitral stenosis. There is mild holosystolic prolapse of both leaflets of the mitral valve.  6. Tricuspid valve regurgitation is severe.  7. The aortic valve is normal in structure. There is moderate calcification of the aortic valve. There is moderate thickening of the aortic valve. Aortic valve regurgitation is not visualized. Aortic valve sclerosis/calcification is present, without any  evidence of aortic stenosis. Aortic valve mean gradient measures 10.5 mmHg. Aortic valve Vmax measures 2.27 m/s.  8. The inferior vena  cava is dilated in size with <50% respiratory variability, suggesting right atrial pressure of 15 mmHg. FINDINGS  Left Ventricle: Left ventricular ejection fraction, by estimation, is 60 to 65%. The left  ventricle has normal function. The left ventricle has no regional wall motion abnormalities. The left ventricular internal cavity size was normal in size. There is  moderate left ventricular hypertrophy. Left ventricular diastolic parameters are indeterminate. Right Ventricle: The right ventricular size is normal. No increase in right ventricular wall thickness. Right ventricular systolic function is normal. There is mildly elevated pulmonary artery systolic pressure. The tricuspid regurgitant velocity is 2.44  m/s, and with an assumed right atrial pressure of 15 mmHg, the estimated right ventricular systolic pressure is 29.2 mmHg. Left Atrium: Left atrial size was moderately dilated. Right Atrium: Right atrial size was moderately dilated. Pericardium: There is no evidence of pericardial effusion. Mitral Valve: The mitral valve is myxomatous. There is mild holosystolic prolapse of both leaflets of the mitral valve. Mild mitral annular calcification. Moderate to severe mitral valve regurgitation. No evidence of mitral valve stenosis. MV peak gradient, 8.2 mmHg. The mean mitral valve gradient is 2.0 mmHg. Tricuspid Valve: The tricuspid valve is normal in structure. Tricuspid valve regurgitation is severe. No evidence of tricuspid stenosis. Aortic Valve: The aortic valve is normal in structure. There is moderate calcification of the aortic valve. There is moderate thickening of the aortic valve. Aortic valve regurgitation is not visualized. Aortic valve sclerosis/calcification is present, without any evidence of aortic stenosis. Aortic valve mean gradient measures 10.5 mmHg. Aortic valve peak gradient measures 20.6 mmHg. Aortic valve area, by VTI measures 1.19 cm. Pulmonic Valve: The pulmonic valve was normal in structure. Pulmonic valve regurgitation is mild. No evidence of pulmonic stenosis. Aorta: The aortic root is normal in size and structure. Venous: The inferior vena cava is dilated in size with less than 50%  respiratory variability, suggesting right atrial pressure of 15 mmHg. IAS/Shunts: No atrial level shunt detected by color flow Doppler. Additional Comments: A device lead is visualized in the right ventricle.  LEFT VENTRICLE PLAX 2D LVIDd:         5.30 cm LVIDs:         3.70 cm LV PW:         1.50 cm LV IVS:        1.40 cm LVOT diam:     2.20 cm LV SV:         55 LV SV Index:   26 LVOT Area:     3.80 cm  RIGHT VENTRICLE            IVC RV Basal diam:  4.30 cm    IVC diam: 2.80 cm RV Mid diam:    3.30 cm RV S prime:     7.83 cm/s TAPSE (M-mode): 1.4 cm LEFT ATRIUM            Index        RIGHT ATRIUM           Index LA diam:      4.80 cm  2.26 cm/m   RA Area:     31.70 cm LA Vol (A2C): 66.0 ml  31.08 ml/m  RA Volume:   96.10 ml  45.25 ml/m LA Vol (A4C): 105.0 ml 49.44 ml/m  AORTIC VALVE AV Area (Vmax):    1.28 cm AV Area (Vmean):   1.13 cm AV Area (VTI):     1.19 cm AV Vmax:  226.67 cm/s AV Vmean:          149.500 cm/s AV VTI:            0.464 m AV Peak Grad:      20.6 mmHg AV Mean Grad:      10.5 mmHg LVOT Vmax:         76.10 cm/s LVOT Vmean:        44.500 cm/s LVOT VTI:          0.145 m LVOT/AV VTI ratio: 0.31  AORTA Ao Root diam: 3.40 cm Ao Asc diam:  3.80 cm MITRAL VALVE                  TRICUSPID VALVE MV Area VTI:  1.80 cm        TR Peak grad:   23.8 mmHg MV Peak grad: 8.2 mmHg        TR Vmax:        244.00 cm/s MV Mean grad: 2.0 mmHg MV Vmax:      1.43 m/s        SHUNTS MV Vmean:     54.5 cm/s       Systemic VTI:  0.14 m MR Peak grad:    89.9 mmHg    Systemic Diam: 2.20 cm MR Mean grad:    66.0 mmHg MR Vmax:         474.00 cm/s MR Vmean:        391.0 cm/s MR PISA:         2.26 cm MR PISA Eff ROA: 19 mm MR PISA Radius:  0.60 cm Candee Furbish MD Electronically signed by Candee Furbish MD Signature Date/Time: 05/10/2022/2:31:14 PM    Final    US THORACENTESIS ASP PLEURAL SPACE W/IMG GUIDE  Result Date: 05/10/2022 INDICATION: Pleural effusion, SOB EXAM: ULTRASOUND GUIDED RIGHT THORACENTESIS  MEDICATIONS: None. COMPLICATIONS: None immediate. PROCEDURE: An ultrasound guided thoracentesis was thoroughly discussed with the patient and questions answered. The benefits, risks, alternatives and complications were also discussed. The patient understands and wishes to proceed with the procedure. Written consent was obtained. Ultrasound was performed to localize and mark an adequate pocket of fluid in the right chest. The area was then prepped and draped in the normal sterile fashion. 1% Lidocaine was used for local anesthesia. Under ultrasound guidance a 6 Fr Safe-T-Centesis catheter was introduced. Thoracentesis was performed. The catheter was removed and a dressing applied. FINDINGS: A total of approximately 1.2L of pleural fluid was removed. Samples were sent to the laboratory as requested by the clinical team. IMPRESSION: Successful ultrasound guided right thoracentesis yielding 1.2L of pleural fluid. Read and performed by: Alexandria Lodge, PA-C Electronically Signed   By: Sandi Mariscal M.D.   On: 05/10/2022 10:31   DG CHEST PORT 1 VIEW  Result Date: 05/10/2022 CLINICAL DATA:  Status post thoracentesis EXAM: PORTABLE CHEST 1 VIEW COMPARISON:  Radiograph 05/10/2022 FINDINGS: Unchanged enlarged cardiac silhouette with dual chamber pacemaker leads. Decreased right pleural effusion with trace residual fluid and basilar atelectasis. Stable small left pleural effusion. Improving pulmonary edema with persistent lower lung airspace disease and small left pleural effusion. No pneumothorax. No acute osseous abnormality. IMPRESSION: Decreased right pleural effusion after thoracentesis. No pneumothorax. Improving pulmonary edema with persistent lower lung airspace disease and small left pleural effusion. Electronically Signed   By: Maurine Simmering M.D.   On: 05/10/2022 10:20   DG Chest 2 View  Result Date: 05/10/2022 CLINICAL DATA:  Follow-up pneumonia. EXAM: CHEST -  2 VIEW COMPARISON:  05/09/2022 and older studies.  FINDINGS: Cardiac silhouette is enlarged, stable. Bilateral mid and lower lung zone airspace opacities are without significant change from the previous day's exam. Moderate bilateral pleural effusions, also stable. No pneumothorax. IMPRESSION: 1. No significant change from the previous day's exam. 2. Cardiomegaly, airspace lung opacities and bilateral effusions suggests congestive heart failure. Electronically Signed   By: Lajean Manes M.D.   On: 05/10/2022 08:00   CT Chest Wo Contrast  Result Date: 05/09/2022 CLINICAL DATA:  Pneumonia. EXAM: CT CHEST WITHOUT CONTRAST TECHNIQUE: Multidetector CT imaging of the chest was performed following the standard protocol without IV contrast. RADIATION DOSE REDUCTION: This exam was performed according to the departmental dose-optimization program which includes automated exposure control, adjustment of the mA and/or kV according to patient size and/or use of iterative reconstruction technique. COMPARISON:  11/24/2021 FINDINGS: Cardiovascular: Heart is enlarged. Coronary artery calcification is evident. Moderate atherosclerotic calcification is noted in the wall of the thoracic aorta. Focal dissection, small saccular aneurysm stable appearance or calcified penetrating ulcer/ulcerated plaque in the descending thoracic aorta is stable. Ascending thoracic aorta measures 4.1 cm diameter. Mediastinum/Nodes: Scattered upper normal mediastinal lymph nodes again noted. No evidence for gross hilar lymphadenopathy although assessment is limited by the lack of intravenous contrast on the current study. The esophagus has normal imaging features. Food debris in the esophagus may be related to dysmotility or reflux. Similar appearance small axillary lymph nodes bilaterally. Lungs/Pleura: Centrilobular emphsyema noted. Interval development of right lower lobe collapse/consolidation with moderate to large right pleural effusion. Patchy airspace disease noted left base with small left  pleural effusion. Upper Abdomen: Probable left hepatic cyst. Calcified granulomata noted in the spleen. Musculoskeletal: No worrisome lytic or sclerotic osseous abnormality. IMPRESSION: 1. Interval development of right lower lobe collapse/consolidation with moderate to large right pleural effusion. 2. Patchy airspace disease left base with small left pleural effusion. 3. Of the thoracic aortic aneurysm measuring up to 4.1 cm diameter in the ascending segment. Recommend annual imaging followup by CTA or MRA. This recommendation follows 2010 ACCF/AHA/AATS/ACR/ASA/SCA/SCAI/SIR/STS/SVM Guidelines for the Diagnosis and Management of Patients with Thoracic Aortic Disease. Circulation. 2010; 121: D638-V564. Aortic aneurysm NOS (ICD10-I71.9) . 4. Aortic Atherosclerosis (ICD10-I70.0) and Emphysema (ICD10-J43.9). Electronically Signed   By: Misty Stanley M.D.   On: 05/09/2022 16:50   CT HEAD WO CONTRAST (5MM)  Result Date: 05/09/2022 CLINICAL DATA:  Mental status changes. EXAM: CT HEAD WITHOUT CONTRAST TECHNIQUE: Contiguous axial images were obtained from the base of the skull through the vertex without intravenous contrast. RADIATION DOSE REDUCTION: This exam was performed according to the departmental dose-optimization program which includes automated exposure control, adjustment of the mA and/or kV according to patient size and/or use of iterative reconstruction technique. COMPARISON:  None. FINDINGS: Brain: There is no evidence for acute hemorrhage, hydrocephalus, mass lesion, or abnormal extra-axial fluid collection. No definite CT evidence for acute infarction. Diffuse loss of parenchymal volume is consistent with atrophy. Patchy low attenuation in the deep hemispheric and periventricular white matter is nonspecific, but likely reflects chronic microvascular ischemic demyelination. Age indeterminate lacunar infarcts seen in the right basal ganglia. Vascular: No hyperdense vessel or unexpected calcification. Skull: No  evidence for fracture. No worrisome lytic or sclerotic lesion. Sinuses/Orbits: Chronic mucosal thickening noted left maxillary sinus. Visualized portions of the globes and intraorbital fat are unremarkable. Other: None. IMPRESSION: 1. No acute intracranial abnormality. 2. Age-indeterminate lacunar infarcts in the right basal ganglia. 3. Atrophy with chronic small vessel ischemic disease.  Electronically Signed   By: Misty Stanley M.D.   On: 05/09/2022 16:45   DG Chest Port 1 View  Result Date: 05/09/2022 CLINICAL DATA:  Pneumonia.  Wheezing. EXAM: PORTABLE CHEST 1 VIEW COMPARISON:  May 01, 2022 FINDINGS: Stable cardiomegaly. The hila and mediastinum are normal. No pneumothorax. Bilateral pleural effusions, right greater than left, more pronounced in the interval. Opacities are identified in the mid lower lungs. No other acute abnormalities. IMPRESSION: 1. Cardiomegaly and pleural effusions. The pleural effusions are larger in the interval and larger on the right than the left. Opacities underlying the effusions may represent atelectasis. 2. Developing pulmonary edema not excluded. Electronically Signed   By: Dorise Bullion III M.D.   On: 05/09/2022 13:38   DG Chest 2 View  Result Date: 05/01/2022 CLINICAL DATA:  Shortness of breath. EXAM: CHEST - 2 VIEW COMPARISON:  CT chest without contrast 11/24/2021 FINDINGS: The heart is enlarged. Atherosclerotic changes are present at the aorta. Bilateral pleural effusions are present, right greater than left. Bibasilar airspace disease likely reflects atelectasis. The upper lungs are clear. Pacing wires are stable. Visualized soft tissues and bony thorax are otherwise unremarkable. IMPRESSION: 1. Cardiomegaly without failure. 2. Bilateral pleural effusions, right greater than left. 3. Bibasilar airspace disease likely reflects atelectasis. Infection is not excluded. Electronically Signed   By: San Morelle M.D.   On: 05/01/2022 20:03   PERIPHERAL VASCULAR  CATHETERIZATION  Result Date: 04/24/2022 DATE OF SERVICE: 04/24/2022  PATIENT:  Jeneen Rinks Linden  88 y.o. male  PRE-OPERATIVE DIAGNOSIS:  Atherosclerosis of native arteries of left lower extremity causing ulceration  POST-OPERATIVE DIAGNOSIS:  Same  PROCEDURE:  1) Ultrasound guided right common femoral artery access 2) Aortogram 3) Left lower extremity angiogram with third order cannulation  SURGEON:  Yevonne Aline. Stanford Breed, MD  ASSISTANT: none  ANESTHESIA:   local  ESTIMATED BLOOD LOSS: minimal  LOCAL MEDICATIONS USED:  LIDOCAINE  COUNTS: confirmed correct.  PATIENT DISPOSITION:  PACU - hemodynamically stable.  Delay start of Pharmacological VTE agent (>24hrs) due to surgical blood loss or risk of bleeding: no  INDICATION FOR PROCEDURE: Simone Tuckey is a 88 y.o. male with left foot ulceration with non-invasive evidence of peripheral arterial disease. After careful discussion of risks, benefits, and alternatives the patient was offered angiography. The patient understood and wished to proceed.  OPERATIVE FINDINGS: Terminal aorta and iliac arteries: Poorly visualized, but widely patent. Small left common iliac aneurysm.  Left lower extremity: Common femoral artery: widely patent Profunda femoris artery: widely patent Superficial femoral artery: widely patent Popliteal artery: widely patent Anterior tibial artery: occluded Tibioperoneal trunk: widely patent Peroneal artery: widely patent to ankle. Arborization about the ankle. Posterior tibial artery: occluded Pedal circulation: small vessel disease about the foot and ankle.  GLASS score. N/A. Inline flow to the ankle.  WIfI score. 1 / 3 / 1. Clinical stage 3.  DESCRIPTION OF PROCEDURE: After identification of the patient in the pre-operative holding area, the patient was transferred to the operating room. The patient was positioned supine on the operating room table. The groins was prepped and draped in standard fashion. A surgical pause was performed confirming correct  patient, procedure, and operative location.  The right groin was anesthetized with subcutaneous injection of 1% lidocaine. Using ultrasound guidance, the right common femoral artery was accessed with micropuncture technique. Fluoroscopy was used to confirm cannulation over the femoral head. The 72F sheath was upsized to 63F.  A Benson wire was advanced into the distal aorta. Over the  wire an omni flush catheter was advanced to the level of L2. Aortogram was performed - see above for details.  The left common iliac artery was selected with an omniflush catheter and glidewire guidewire. The wire was advanced into the common femoral artery. Over the wire the omni flush catheter was advanced into the external iliac artery. Selective angiography was performed - see above for details.  The sheath was left in place to be removed in the recovery area.  Upon completion of the case instrument and sharps counts were confirmed correct. The patient was transferred to the PACU in good condition. I was present for all portions of the procedure.  PLAN: Small vessel disease in the foot. Patient optimized from a vascular standpoint. Recommend best medical therapy for peripheral arterial disease (ASA, statin, etc.).  Yevonne Aline. Stanford Breed, MD [facility] Phone Number: [phone] 04/24/2022 1:18 PM    VAS [place] ABI WITH/WO TBI  Result Date: 04/23/2022  LOWER EXTREMITY DOPPLER STUDY Patient Name:  CLYDE ZARRELLA  Date of Exam:   04/23/2022 Medical Rec #: [id number]      Accession #:    [id number] Date of Birth: 12/01/1929      Patient Gender: M Patient Age:   58 years Exam Location:  [facility] Procedure:      VAS [place] ABI WITH/WO TBI Referring Phys: DAVID GIRGUIS --------------------------------------------------------------------------------  Indications: Rest pain. High Risk Factors: Hypertension, hyperlipidemia, past history of smoking.  Comparison Study: Previous exam dated 11/07/2018,  Bilateral ABI                   non-compressible, Rt TBI 0.56, Lt TBI 0.67 Performing Technologist: Bobetta Lime BS, RVT  Examination Guidelines: A complete evaluation includes at minimum, Doppler waveform signals and systolic blood pressure reading at the level of bilateral brachial, anterior tibial, and posterior tibial arteries, when vessel segments are accessible. Bilateral testing is considered an integral part of a complete examination. Photoelectric Plethysmograph (PPG) waveforms and toe systolic pressure readings are included as required and additional duplex testing as needed. Limited examinations for reoccurring indications may be performed as noted.  ABI Findings: +---------+------------------+-----+-------------------+--------+ Right    Rt Pressure (mmHg)IndexWaveform           Comment  +---------+------------------+-----+-------------------+--------+ Brachial 99                     triphasic                   +---------+------------------+-----+-------------------+--------+ PTA      54                0.55 dampened monophasic         +---------+------------------+-----+-------------------+--------+ DP       255               2.58 monophasic                  +---------+------------------+-----+-------------------+--------+ Great Toe                       Absent                      +---------+------------------+-----+-------------------+--------+ +---------+------------------+-----+-------------------+---------------+ Left     Lt Pressure (mmHg)IndexWaveform           Comment         +---------+------------------+-----+-------------------+---------------+ Brachial  triphasic          IV in upper arm +---------+------------------+-----+-------------------+---------------+ PTA      255               2.58 dampened monophasic                +---------+------------------+-----+-------------------+---------------+ DP       50                 0.51 monophasic                         +---------+------------------+-----+-------------------+---------------+ Great Toe                       Absent                             +---------+------------------+-----+-------------------+---------------+ +-------+-----------+-----------+------------+------------+ ABI/TBIToday's ABIToday's TBIPrevious ABIPrevious TBI +-------+-----------+-----------+------------+------------+ Right  [place]         Absent                              +-------+-----------+-----------+------------+------------+ Left   [place]         Absent                              +-------+-----------+-----------+------------+------------+ Right TBIs appear decreased. Left TBIs appear decreased. Bilateral absent waveforms in the great toes, Bilateral ABI's remain non-compressible.  Summary: Right: Resting right ankle-brachial index indicates noncompressible right lower extremity arteries. The right toe-brachial index is abnormal. Left: Resting left ankle-brachial index indicates noncompressible left lower extremity arteries. The left toe-brachial index is abnormal. *See table(s) above for measurements and observations.  Electronically signed by Monica Martinez MD on 04/23/2022 at 3:15:02 PM.    Final    CT FOOT LEFT WO CONTRAST  Result Date: 04/22/2022 CLINICAL DATA:  Left foot swelling. EXAM: CT OF THE LEFT FOOT WITHOUT CONTRAST TECHNIQUE: Multidetector CT imaging of the left foot was performed according to the standard protocol. Multiplanar CT image reconstructions were also generated. RADIATION DOSE REDUCTION: This exam was performed according to the departmental dose-optimization program which includes automated exposure control, adjustment of the mA and/or kV according to patient size and/or use of iterative reconstruction technique. COMPARISON:  Left foot x-rays dated October 18, 2018. FINDINGS: Bones/Joint/Cartilage No bony destruction or periosteal reaction. No  fracture or dislocation. Minimal midfoot degenerative changes. No joint effusion. Osteopenia. Ligaments Ligaments are suboptimally evaluated by CT. Muscles and Tendons Grossly intact.  Complete atrophy of the intrinsic foot muscles. Soft tissue Diffuse soft tissue swelling, worse dorsally. No fluid collection or subcutaneous emphysema. No soft tissue mass. IMPRESSION: 1. Diffuse soft tissue swelling, worse dorsally, nonspecific, but can be seen in the setting of cellulitis. No abscess. 2. No acute osseous abnormality. Electronically Signed   By: Titus Dubin M.D.   On: 04/22/2022 08:05   VAS [place] LOWER EXTREMITY VENOUS (DVT) (ONLY [facility] & [facility])  Result Date: 04/19/2022  Lower Venous DVT Study Patient Name:  LUCION DILGER  Date of Exam:   04/18/2022 Medical Rec #: [id number]      Accession #:    [id number] Date of Birth: December 16, 1929      Patient Gender: M Patient Age:   22 years Exam Location:  [facility] Procedure:      VAS [place] LOWER  EXTREMITY VENOUS (DVT) Referring Phys: ROBERT PATERSON --------------------------------------------------------------------------------  Indications: Pain, Swelling, and Erythema.  Limitations: Patient unable to tolerate compressions secondary to pain. Comparison Study: No prior study on file Performing Technologist: Sharion Dove RVS  Examination Guidelines: A complete evaluation includes B-mode imaging, spectral Doppler, color Doppler, and power Doppler as needed of all accessible portions of each vessel. Bilateral testing is considered an integral part of a complete examination. Limited examinations for reoccurring indications may be performed as noted. The reflux portion of the exam is performed with the patient in reverse Trendelenburg.  +-----+---------------+---------+-----------+-------------------+--------------+ RIGHTCompressibilityPhasicitySpontaneityProperties         Thrombus Aging +-----+---------------+---------+-----------+-------------------+--------------+  CFV  Full                               Pulsatile waveforms               +-----+---------------+---------+-----------+-------------------+--------------+   +--------+---------------+---------+-----------+----------------+-------------+ LEFT    CompressibilityPhasicitySpontaneityProperties      Thrombus                                                                 Aging         +--------+---------------+---------+-----------+----------------+-------------+ CFV     Full                               Pulsatile                                                                waveforms                     +--------+---------------+---------+-----------+----------------+-------------+ SFJ     Full                                                             +--------+---------------+---------+-----------+----------------+-------------+ FV Prox Full                               Pulsatile                                                                waveforms                     +--------+---------------+---------+-----------+----------------+-------------+ FV Mid                                     Pulsatile  waveforms                     +--------+---------------+---------+-----------+----------------+-------------+ FV                                         Pulsatile                     Distal                                     waveforms                     +--------+---------------+---------+-----------+----------------+-------------+ PFV     Full                               Pulsatile                                                                waveforms                     +--------+---------------+---------+-----------+----------------+-------------+ POP                                        Pulsatile                                                                 waveforms                     +--------+---------------+---------+-----------+----------------+-------------+ PTV     Full                                                             +--------+---------------+---------+-----------+----------------+-------------+ PERO    Full                                                             +--------+---------------+---------+-----------+----------------+-------------+     Summary: RIGHT: - No evidence of common femoral vein obstruction. Pulsatile waveforms  LEFT: - There is no evidence of deep vein thrombosis in the lower extremity.  - No cystic structure found in the popliteal fossa. Pulsatile waveforms.  *See table(s) above for measurements and observations. Electronically signed by Orlie Pollen on 04/19/2022 at 10:43:30 AM.    Final    US RENAL  Result Date: 04/19/2022 CLINICAL  DATA:  Acute renal failure, stage IV chronic renal disease. EXAM: RENAL / URINARY TRACT ULTRASOUND COMPLETE COMPARISON:  None Available. FINDINGS: Right Kidney: Renal measurements: 9.6 cm x 4.4 cm x 4.5 cm = volume: 99.2 mL. Mild, diffusely increased echogenicity of the renal parenchyma is noted. No mass or hydronephrosis visualized. Left Kidney: Renal measurements: 11.0 cm x 5.5 cm x 5.0 cm = volume: 158.0 mL. Mild, diffusely increased echogenicity of the renal parenchyma is noted. No mass or hydronephrosis visualized. Bladder: Appears normal for degree of bladder distention. Other: None. IMPRESSION: Bilateral mildly echogenic kidneys which may be secondary to medical renal disease. Electronically Signed   By: Virgina Norfolk M.D.   On: 04/19/2022 02:01    Microbiology: Results for orders placed or performed during the hospital encounter of 05/09/22  Resp Panel by RT-PCR (Flu A&B, Covid) Anterior Nasal Swab     Status: None   Collection Time: 05/09/22  1:30 PM   Specimen: Anterior Nasal Swab  Result Value Ref Range Status   SARS Coronavirus 2  by RT PCR NEGATIVE NEGATIVE Final    Comment: (NOTE) SARS-CoV-2 target nucleic acids are NOT DETECTED.  The SARS-CoV-2 RNA is generally detectable in upper respiratory specimens during the acute phase of infection. The lowest concentration of SARS-CoV-2 viral copies this assay can detect is 138 copies/mL. A negative result does not preclude SARS-Cov-2 infection and should not be used as the sole basis for treatment or other patient management decisions. A negative result may occur with  improper specimen collection/handling, submission of specimen other than nasopharyngeal swab, presence of viral mutation(s) within the areas targeted by this assay, and inadequate number of viral copies(<138 copies/mL). A negative result must be combined with clinical observations, patient history, and epidemiological information. The expected result is Negative.  Fact Sheet for Patients:  EntrepreneurPulse.com.au  Fact Sheet for Healthcare Providers:  IncredibleEmployment.be  This test is no t yet approved or cleared by the [place] FDA and  has been authorized for detection and/or diagnosis of SARS-CoV-2 by FDA under an Emergency Use Authorization (EUA). This EUA will remain  in effect (meaning this test can be used) for the duration of the COVID-19 declaration under Section 564(b)(1) of the Act, 21 U.S.C.section 360bbb-3(b)(1), unless the authorization is terminated  or revoked sooner.       Influenza A by PCR NEGATIVE NEGATIVE Final   Influenza B by PCR NEGATIVE NEGATIVE Final    Comment: (NOTE) The Xpert Xpress SARS-CoV-2/FLU/RSV plus assay is intended as an aid in the diagnosis of influenza from Nasopharyngeal swab specimens and should not be used as a sole basis for treatment. Nasal washings and aspirates are unacceptable for Xpert Xpress SARS-CoV-2/FLU/RSV testing.  Fact Sheet for Patients: EntrepreneurPulse.com.au  Fact  Sheet for Healthcare Providers: IncredibleEmployment.be  This test is not yet approved or cleared by the [place] FDA and has been authorized for detection and/or diagnosis of SARS-CoV-2 by FDA under an Emergency Use Authorization (EUA). This EUA will remain in effect (meaning this test can be used) for the duration of the COVID-19 declaration under Section 564(b)(1) of the Act, 21 U.S.C. section 360bbb-3(b)(1), unless the authorization is terminated or revoked.  Performed at [facility], [place] [address] [place], [place] [address]   Gram stain     Status: None   Collection Time: 05/10/22 10:17 AM   Specimen: Pleura  Result Value Ref Range Status   Specimen Description PLEURAL  Final   Special Requests NONE  Final  Gram Stain   Final    RARE WBC PRESENT, PREDOMINANTLY MONONUCLEAR NO ORGANISMS SEEN Performed at [facility] [address] [place], [place] [address]    Report Status 05/10/2022 FINAL  Final  Culture, body fluid w Gram Stain-bottle     Status: None   Collection Time: 05/10/22 10:17 AM   Specimen: Pleura  Result Value Ref Range Status   Specimen Description PLEURAL  Final   Special Requests NONE  Final   Culture   Final    NO GROWTH 5 DAYS Performed at [place] [address] [place], [place] [address]    Report Status 05/15/2022 FINAL  Final    Labs: CBC: Recent Labs  Lab 05/09/22 1330 05/09/22 1713 05/10/22 0237 05/11/22 0203  WBC 7.4  --  8.2 9.1  NEUTROABS 5.7  --   --   --   HGB 8.8* 8.8* 8.7* 8.7*  HCT 28.6* 26.0* 26.6* 26.7*  MCV 107.1*  --  103.1* 102.7*  PLT 188  --  171 854   Basic Metabolic Panel: Recent Labs  Lab 05/09/22 1624 05/09/22 1713 05/11/22 0203 05/12/22 0322 05/13/22 0506 05/14/22 0241 05/15/22 0450  NA  --    < > 141 142 139 137 137  K  --    < > 4.0 3.7 3.6 3.6 3.8  CL  --    < > 110 106 108 102 104  CO2  --    < > _0 GLUCOSE  --    < > 133* 105* 116*  110* 106*  BUN  --    < > 39* 39* 43* 43* 43*  CREATININE  --    < > 2.31* 2.34* 2.48* 2.35* 2.29*  CALCIUM  --    < > 10.0 10.0 9.7 9.9 10.2  MG 2.0  --   --  1.6*  --  1.7  --    < > = values in this interval not displayed.   Liver Function Tests: Recent Labs  Lab 05/09/22 1330 05/10/22 0237  AST 17 16  ALT 14 12  ALKPHOS 158* 146*  BILITOT 0.9 0.6  PROT 6.2* 5.5*  ALBUMIN 2.8* 2.6*   CBG: No results for input(s): "GLUCAP" in the last 168 hours.  Discharge time spent: greater than 30 minutes.  Signed: Tawni Millers, MD [facility] 05/15/2022

## 2022-05-15 NOTE — Progress Notes (Signed)
OT Cancellation Note  Patient Details Name: Steven Williams MRN: [id number] DOB: 02/19/1930   Cancelled Treatment:    Reason Eval/Treat Not Completed: Other (comment) Patient with family in the room and assisting with breakfast. OT will follow back as time permits.   Corinne Ports E. Youa Deloney, OTR/L Acute Rehabilitation Services [phone]   Ascencion Dike 05/15/2022, 9:56 AM

## 2022-05-22 ENCOUNTER — Inpatient Hospital Stay (HOSPITAL_COMMUNITY)
Admission: EM | Admit: 2022-05-22 | Discharge: 2022-05-24 | DRG: 071 | Disposition: A | Discharge status: Hospice | Payer: Medicare Other | Source: Skilled Nursing Facility | Attending: Internal Medicine | Admitting: Internal Medicine

## 2022-05-22 ENCOUNTER — Emergency Department (HOSPITAL_COMMUNITY): Payer: Medicare Other

## 2022-05-22 ENCOUNTER — Other Ambulatory Visit: Payer: Self-pay

## 2022-05-22 ENCOUNTER — Encounter (HOSPITAL_COMMUNITY): Payer: Self-pay | Admitting: Emergency Medicine

## 2022-05-22 DIAGNOSIS — E870 Hyperosmolality and hypernatremia: Secondary | ICD-10-CM | POA: Diagnosis present

## 2022-05-22 DIAGNOSIS — G2581 Restless legs syndrome: Secondary | ICD-10-CM | POA: Diagnosis present

## 2022-05-22 DIAGNOSIS — M069 Rheumatoid arthritis, unspecified: Secondary | ICD-10-CM | POA: Diagnosis present

## 2022-05-22 DIAGNOSIS — D469 Myelodysplastic syndrome, unspecified: Secondary | ICD-10-CM | POA: Diagnosis present

## 2022-05-22 DIAGNOSIS — E039 Hypothyroidism, unspecified: Secondary | ICD-10-CM | POA: Diagnosis present

## 2022-05-22 DIAGNOSIS — E785 Hyperlipidemia, unspecified: Secondary | ICD-10-CM | POA: Diagnosis present

## 2022-05-22 DIAGNOSIS — L89610 Pressure ulcer of right heel, unstageable: Secondary | ICD-10-CM | POA: Diagnosis present

## 2022-05-22 DIAGNOSIS — I130 Hypertensive heart and chronic kidney disease with heart failure and stage 1 through stage 4 chronic kidney disease, or unspecified chronic kidney disease: Secondary | ICD-10-CM | POA: Diagnosis present

## 2022-05-22 DIAGNOSIS — R040 Epistaxis: Secondary | ICD-10-CM | POA: Diagnosis present

## 2022-05-22 DIAGNOSIS — Z91041 Radiographic dye allergy status: Secondary | ICD-10-CM

## 2022-05-22 DIAGNOSIS — I10 Essential (primary) hypertension: Secondary | ICD-10-CM | POA: Diagnosis present

## 2022-05-22 DIAGNOSIS — Z7982 Long term (current) use of aspirin: Secondary | ICD-10-CM

## 2022-05-22 DIAGNOSIS — T460X1A Poisoning by cardiac-stimulant glycosides and drugs of similar action, accidental (unintentional), initial encounter: Secondary | ICD-10-CM

## 2022-05-22 DIAGNOSIS — R001 Bradycardia, unspecified: Secondary | ICD-10-CM | POA: Diagnosis present

## 2022-05-22 DIAGNOSIS — I712 Thoracic aortic aneurysm, without rupture, unspecified: Secondary | ICD-10-CM | POA: Diagnosis present

## 2022-05-22 DIAGNOSIS — K219 Gastro-esophageal reflux disease without esophagitis: Secondary | ICD-10-CM | POA: Diagnosis present

## 2022-05-22 DIAGNOSIS — M889 Osteitis deformans of unspecified bone: Secondary | ICD-10-CM | POA: Diagnosis present

## 2022-05-22 DIAGNOSIS — G9341 Metabolic encephalopathy: Secondary | ICD-10-CM | POA: Diagnosis not present

## 2022-05-22 DIAGNOSIS — Z823 Family history of stroke: Secondary | ICD-10-CM

## 2022-05-22 DIAGNOSIS — M5030 Other cervical disc degeneration, unspecified cervical region: Secondary | ICD-10-CM | POA: Diagnosis present

## 2022-05-22 DIAGNOSIS — Z87891 Personal history of nicotine dependence: Secondary | ICD-10-CM

## 2022-05-22 DIAGNOSIS — R0902 Hypoxemia: Secondary | ICD-10-CM | POA: Diagnosis present

## 2022-05-22 DIAGNOSIS — L89620 Pressure ulcer of left heel, unstageable: Secondary | ICD-10-CM | POA: Diagnosis present

## 2022-05-22 DIAGNOSIS — M109 Gout, unspecified: Secondary | ICD-10-CM | POA: Diagnosis present

## 2022-05-22 DIAGNOSIS — N179 Acute kidney failure, unspecified: Secondary | ICD-10-CM | POA: Diagnosis present

## 2022-05-22 DIAGNOSIS — Z515 Encounter for palliative care: Secondary | ICD-10-CM

## 2022-05-22 DIAGNOSIS — I5032 Chronic diastolic (congestive) heart failure: Secondary | ICD-10-CM | POA: Diagnosis present

## 2022-05-22 DIAGNOSIS — Z79890 Hormone replacement therapy (postmenopausal): Secondary | ICD-10-CM

## 2022-05-22 DIAGNOSIS — Z95820 Peripheral vascular angioplasty status with implants and grafts: Secondary | ICD-10-CM

## 2022-05-22 DIAGNOSIS — Z66 Do not resuscitate: Secondary | ICD-10-CM | POA: Diagnosis present

## 2022-05-22 DIAGNOSIS — J9811 Atelectasis: Secondary | ICD-10-CM | POA: Diagnosis present

## 2022-05-22 DIAGNOSIS — Z6833 Body mass index (BMI) 33.0-33.9, adult: Secondary | ICD-10-CM

## 2022-05-22 DIAGNOSIS — R760 Raised antibody titer: Secondary | ICD-10-CM

## 2022-05-22 DIAGNOSIS — H35033 Hypertensive retinopathy, bilateral: Secondary | ICD-10-CM | POA: Diagnosis present

## 2022-05-22 DIAGNOSIS — R410 Disorientation, unspecified: Secondary | ICD-10-CM

## 2022-05-22 DIAGNOSIS — D6861 Antiphospholipid syndrome: Secondary | ICD-10-CM | POA: Diagnosis present

## 2022-05-22 DIAGNOSIS — H3530 Unspecified macular degeneration: Secondary | ICD-10-CM | POA: Diagnosis present

## 2022-05-22 DIAGNOSIS — R627 Adult failure to thrive: Secondary | ICD-10-CM | POA: Diagnosis present

## 2022-05-22 DIAGNOSIS — T460X5A Adverse effect of cardiac-stimulant glycosides and drugs of similar action, initial encounter: Secondary | ICD-10-CM | POA: Diagnosis present

## 2022-05-22 DIAGNOSIS — N184 Chronic kidney disease, stage 4 (severe): Secondary | ICD-10-CM | POA: Diagnosis present

## 2022-05-22 DIAGNOSIS — Z801 Family history of malignant neoplasm of trachea, bronchus and lung: Secondary | ICD-10-CM

## 2022-05-22 DIAGNOSIS — R8271 Bacteriuria: Secondary | ICD-10-CM | POA: Diagnosis present

## 2022-05-22 DIAGNOSIS — N400 Enlarged prostate without lower urinary tract symptoms: Secondary | ICD-10-CM | POA: Diagnosis present

## 2022-05-22 DIAGNOSIS — R778 Other specified abnormalities of plasma proteins: Secondary | ICD-10-CM | POA: Diagnosis present

## 2022-05-22 DIAGNOSIS — J449 Chronic obstructive pulmonary disease, unspecified: Secondary | ICD-10-CM | POA: Diagnosis present

## 2022-05-22 DIAGNOSIS — I4821 Permanent atrial fibrillation: Secondary | ICD-10-CM | POA: Diagnosis present

## 2022-05-22 DIAGNOSIS — Z7901 Long term (current) use of anticoagulants: Secondary | ICD-10-CM

## 2022-05-22 DIAGNOSIS — E669 Obesity, unspecified: Secondary | ICD-10-CM | POA: Diagnosis present

## 2022-05-22 DIAGNOSIS — E860 Dehydration: Secondary | ICD-10-CM | POA: Diagnosis present

## 2022-05-22 DIAGNOSIS — Z8673 Personal history of transient ischemic attack (TIA), and cerebral infarction without residual deficits: Secondary | ICD-10-CM

## 2022-05-22 DIAGNOSIS — L98429 Non-pressure chronic ulcer of back with unspecified severity: Secondary | ICD-10-CM | POA: Diagnosis present

## 2022-05-22 DIAGNOSIS — Z79899 Other long term (current) drug therapy: Secondary | ICD-10-CM

## 2022-05-22 DIAGNOSIS — I482 Chronic atrial fibrillation, unspecified: Secondary | ICD-10-CM | POA: Diagnosis present

## 2022-05-22 DIAGNOSIS — J90 Pleural effusion, not elsewhere classified: Secondary | ICD-10-CM | POA: Diagnosis present

## 2022-05-22 DIAGNOSIS — D631 Anemia in chronic kidney disease: Secondary | ICD-10-CM | POA: Diagnosis present

## 2022-05-22 DIAGNOSIS — L89159 Pressure ulcer of sacral region, unspecified stage: Secondary | ICD-10-CM | POA: Diagnosis present

## 2022-05-22 LAB — CBC WITH DIFFERENTIAL/PLATELET
Abs Immature Granulocytes: 0.04 10*3/uL (ref 0.00–0.07)
Basophils Absolute: 0.1 10*3/uL (ref 0.0–0.1)
Basophils Relative: 1 %
Eosinophils Absolute: 0.3 10*3/uL (ref 0.0–0.5)
Eosinophils Relative: 5 %
HCT: 28 % — ABNORMAL LOW (ref 39.0–52.0)
Hemoglobin: 8.8 g/dL — ABNORMAL LOW (ref 13.0–17.0)
Immature Granulocytes: 1 %
Lymphocytes Relative: 15 %
Lymphs Abs: 0.9 10*3/uL (ref 0.7–4.0)
MCH: 32.8 pg (ref 26.0–34.0)
MCHC: 31.4 g/dL (ref 30.0–36.0)
MCV: 104.5 fL — ABNORMAL HIGH (ref 80.0–100.0)
Monocytes Absolute: 0.9 10*3/uL (ref 0.1–1.0)
Monocytes Relative: 15 %
Neutro Abs: 3.4 10*3/uL (ref 1.7–7.7)
Neutrophils Relative %: 63 %
Platelets: 168 10*3/uL (ref 150–400)
RBC: 2.68 MIL/uL — ABNORMAL LOW (ref 4.22–5.81)
RDW: 15.2 % (ref 11.5–15.5)
WBC: 5.5 10*3/uL (ref 4.0–10.5)
nRBC: 0 % (ref 0.0–0.2)

## 2022-05-22 LAB — URINALYSIS, ROUTINE W REFLEX MICROSCOPIC
Bilirubin Urine: NEGATIVE
Glucose, UA: NEGATIVE mg/dL
Hgb urine dipstick: NEGATIVE
Ketones, ur: NEGATIVE mg/dL
Nitrite: NEGATIVE
Protein, ur: NEGATIVE mg/dL
Specific Gravity, Urine: 1.01 (ref 1.005–1.030)
pH: 5 (ref 5.0–8.0)

## 2022-05-22 LAB — BASIC METABOLIC PANEL
Anion gap: 11 (ref 5–15)
BUN: 38 mg/dL — ABNORMAL HIGH (ref 8–23)
CO2: 27 mmol/L (ref 22–32)
Calcium: 11.6 mg/dL — ABNORMAL HIGH (ref 8.9–10.3)
Chloride: 107 mmol/L (ref 98–111)
Creatinine, Ser: 1.95 mg/dL — ABNORMAL HIGH (ref 0.61–1.24)
GFR, Estimated: 32 mL/min — ABNORMAL LOW (ref >60)
Glucose, Bld: 104 mg/dL — ABNORMAL HIGH (ref 70–99)
Potassium: 3.4 mmol/L — ABNORMAL LOW (ref 3.5–5.1)
Sodium: 145 mmol/L (ref 135–145)

## 2022-05-22 LAB — TROPONIN I (HIGH SENSITIVITY)
Troponin I (High Sensitivity): 25 ng/L — ABNORMAL HIGH (ref <18)
Troponin I (High Sensitivity): 34 ng/L — ABNORMAL HIGH (ref <18)

## 2022-05-22 LAB — DIGOXIN LEVEL: Digoxin Level: 0.8 ng/mL (ref 0.8–2.0)

## 2022-05-22 LAB — CBG MONITORING, ED: Glucose-Capillary: 99 mg/dL (ref 70–99)

## 2022-05-22 MED ORDER — TRAMADOL HCL 50 MG PO TABS
50.0000 mg | ORAL_TABLET | Freq: Once | ORAL | Status: AC
  Administered 2022-05-22: 50 mg via ORAL
  Filled 2022-05-22: qty 1

## 2022-05-22 MED ORDER — SODIUM CHLORIDE 0.9 % IV BOLUS
500.0000 mL | Freq: Once | INTRAVENOUS | Status: AC
  Administered 2022-05-22: 500 mL via INTRAVENOUS

## 2022-05-22 NOTE — ED Provider Notes (Signed)
[facility] EMERGENCY DEPARTMENT Provider Note   CSN: [id number] Arrival date & time: 05/22/22  1216     History {Add pertinent medical, surgical, social history, OB history to HPI:1} Chief Complaint  Patient presents with   Failure To Thrive    Steven Williams is a 88 y.o. male.  Patient has Paget's disease hypertension COPD and is at a rehab center.  His daughter stated that he has not been drinking much lately and thought maybe he was dehydrated.   Weakness Severity:  Moderate Onset quality:  Gradual Timing:  Constant Progression:  Waxing and waning Chronicity:  Recurrent Context: not alcohol use   Relieved by:  Nothing Worsened by:  Nothing Ineffective treatments:  None tried Associated symptoms: no abdominal pain, no chest pain, no cough, no diarrhea, no frequency, no headaches and no seizures        Home Medications Prior to Admission medications   Medication Sig Start Date End Date Taking? Authorizing Provider  acetaminophen (TYLENOL) 325 MG tablet Take 2 tablets (650 mg total) by mouth every 6 (six) hours as needed for moderate pain or mild pain (or Fever >/= 101). 05/15/22  Yes Arrien, Jimmy Picket, MD  allopurinol (ZYLOPRIM) 100 MG tablet Take 0.5 tablets (50 mg total) by mouth every other day. 04/27/22  Yes Dwyane Dee, MD  Amino Acids-Protein Hydrolys (PRO-STAT PO) Take 30 mLs by mouth daily. 05/19/22  Yes [provider]  aspirin EC 81 MG tablet Take 1 tablet (81 mg total) by mouth daily. Swallow whole. 04/28/22  Yes Dwyane Dee, MD  atorvastatin (LIPITOR) 40 MG tablet Take 1 tablet (40 mg total) by mouth daily. 04/28/22  Yes Dwyane Dee, MD  carvedilol (COREG) 3.125 MG tablet Take 1 tablet (3.125 mg total) by mouth 2 (two) times daily with a meal. 05/15/22 06/14/22 Yes Arrien, Jimmy Picket, MD  cholecalciferol (VITAMIN D3) 25 MCG (1000 UNIT) tablet Take 2,000 Units by mouth daily.   Yes [provider]  digoxin  (LANOXIN) 0.125 MG tablet Take 0.125 mg by mouth every Monday, Wednesday, and Friday.   Yes [provider]  Ensure (ENSURE) Take 237 mLs by mouth daily.   Yes [provider]  Ferrous Sulfate (IRON) 325 (65 Fe) MG TABS Take 1 tablet (325 mg total) by mouth daily with breakfast. 04/27/22  Yes Dwyane Dee, MD  furosemide (LASIX) 40 MG tablet Take 1 tablet (40 mg total) by mouth daily. 05/16/22  Yes Arrien, Jimmy Picket, MD  hydrocortisone (ANUSOL-HC) 2.5 % rectal cream Apply 1 application topically 2 (two) times daily as needed for hemorrhoids or anal itching. 04/15/21  Yes [provider]  levothyroxine (SYNTHROID) 100 MCG tablet Take 100 mcg by mouth daily. 06/04/20  Yes [provider]  Multiple Vitamins-Minerals (PRESERVISION AREDS PO) Take 1 capsule by mouth 2 (two) times daily.   Yes [provider]  pantoprazole (PROTONIX) 40 MG tablet Take 40 mg by mouth daily. 06/04/20  Yes [provider]  Polyvinyl Alcohol-Povidone (REFRESH OP) Place 1 drop into both eyes 4 (four) times daily. Wait 3-5 min between different eye drops. 05/15/22  Yes [provider]  rOPINIRole (REQUIP) 1 MG tablet Take 3 tablets (3 mg total) by mouth at bedtime. 04/27/22  Yes Dwyane Dee, MD  saw palmetto 500 MG capsule Take 500 mg by mouth daily.   Yes [provider]  senna-docusate (SENEXON-S) 8.6-50 MG tablet Take 1 tablet by mouth 2 (two) times daily.   Yes [provider]  sodium chloride (OCEAN) 0.65 % SOLN nasal spray Place 2 sprays into both nostrils every 4 (four) hours while awake. 01/11/22 05/22/22 Yes Pahwani, Einar Grad, MD  Sodium Phosphates (RA SALINE ENEMA RE) Place 1 Dose rectally as needed (constipation). If not relieved by Biscodyl suppository, give disposable Saline enema rectally x 1 dose/24 hours as needed. 05/17/22  Yes [provider]  tamsulosin (FLOMAX) 0.4 MG CAPS capsule Take 0.4 mg by mouth daily.   Yes [provider]  traMADol (ULTRAM) 50 MG tablet Take 0.5 tablets (25 mg total) by mouth every 12 (twelve) hours as needed for severe pain. 05/15/22  Yes Arrien, Jimmy Picket, MD  vitamin B-12 (CYANOCOBALAMIN) 1000 MCG tablet Take 1 tablet (1,000 mcg total) by mouth daily. 07/09/21  Yes Orson Slick, MD      Allergies    Iodinated contrast media    Review of Systems   Review of Systems  Constitutional:  Negative for appetite change and fatigue.  HENT:  Negative for congestion, ear discharge and sinus pressure.   Eyes:  Negative for discharge.  Respiratory:  Negative for cough.   Cardiovascular:  Negative for chest pain.  Gastrointestinal:  Negative for abdominal pain and diarrhea.  Genitourinary:  Negative for frequency and hematuria.  Musculoskeletal:  Negative for back pain.  Skin:  Negative for rash.  Neurological:  Positive for weakness. Negative for seizures and headaches.  Psychiatric/Behavioral:  Negative for hallucinations.     Physical Exam Updated Vital Signs BP 112/77   Pulse 74   Temp 97.8 F (36.6 C) (Oral)   Resp (!) 21   Ht '6\' 2"'$  (1.88 m)   Wt 117.9 kg   SpO2 100%   BMI 33.38 kg/m  Physical Exam Vitals and nursing note reviewed.  Constitutional:      Appearance: He is well-developed.     Comments: Mild lethargy  HENT:     Head: Normocephalic.     Nose: Nose normal.     Mouth/Throat:     Comments: Dry mucous membrane Eyes:     General: No scleral icterus.    Conjunctiva/sclera: Conjunctivae normal.  Neck:     Thyroid: No thyromegaly.  Cardiovascular:     Rate and Rhythm: Normal rate and regular rhythm.     Heart sounds: No murmur heard.    No friction rub. No gallop.  Pulmonary:     Breath sounds: No stridor. No wheezing or rales.  Chest:     Chest wall: No tenderness.  Abdominal:     General: There is no distension.     Tenderness: There is no abdominal tenderness. There is no rebound.  Musculoskeletal:        General: Normal range of  motion.     Cervical back: Neck supple.  Lymphadenopathy:     Cervical: No cervical adenopathy.  Skin:    Findings: No erythema or rash.  Neurological:     Mental Status: He is oriented to person, place, and time.     Motor: No abnormal muscle tone.     Coordination: Coordination normal.  Psychiatric:        Behavior: Behavior normal.     ED Results / Procedures / Treatments   Labs (all labs ordered are listed, but only abnormal results are displayed) Labs Reviewed  CBC WITH DIFFERENTIAL/PLATELET - Abnormal; Notable for the following components:      Result Value   RBC 2.68 (*)    Hemoglobin 8.8 (*)  HCT 28.0 (*)    MCV 104.5 (*)    All other components within normal limits  BASIC METABOLIC PANEL - Abnormal; Notable for the following components:   Potassium 3.4 (*)    Glucose, Bld 104 (*)    BUN 38 (*)    Creatinine, Ser 1.95 (*)    Calcium 11.6 (*)    GFR, Estimated 32 (*)    All other components within normal limits  URINALYSIS, ROUTINE W REFLEX MICROSCOPIC  CBG MONITORING, ED    EKG None  Radiology DG Chest 1 View  Result Date: 05/22/2022 CLINICAL DATA:  Shortness of breath. EXAM: CHEST  1 VIEW COMPARISON:  AP chest 05/10/2022, chest two views 05/10/2022, CT chest 05/09/2022 FINDINGS: Left chest wall cardiac pacer with leads overlying the right atrium and right ventricle. Cardiac silhouette is again moderately to markedly enlarged. Mediastinal contours are within normal limits. Moderate calcification within the aortic arch. Moderate right and small left pleural effusions appear similar to the two views of the chest 05/10/2022 study at 6:28 a.m. There was less opacification from pleural fluid on the 05/10/2022 AP view of the chest at 9:58 a.m. following interval thoracentesis. Moderate bibasilar heterogeneous airspace opacities. No pneumothorax is seen. No acute skeletal abnormality. Moderate multilevel degenerative bridging osteophytes of the thoracic spine.  IMPRESSION: 1. Moderate right and small left pleural effusions. This appears very similar to the 05/10/2022 radiographs prior to thoracentesis later that day. 2. Bibasilar heterogeneous airspace opacities, likely atelectasis. Cannot exclude underlying pneumonia. Electronically Signed   By: Yvonne Kendall M.D.   On: 05/22/2022 13:32    Procedures Procedures  {Document cardiac monitor, telemetry assessment procedure when appropriate:1}  Medications Ordered in ED Medications  sodium chloride 0.9 % bolus 500 mL (has no administration in time range)  sodium chloride 0.9 % bolus 500 mL (500 mLs Intravenous New Bag/Given 05/22/22 1310)  traMADol (ULTRAM) tablet 50 mg (50 mg Oral Given 05/22/22 1502)    ED Course/ Medical Decision Making/ A&P                           Medical Decision Making Amount and/or Complexity of Data Reviewed Labs: ordered. Radiology: ordered.  Risk Prescription drug management.   ***  {Document critical care time when appropriate:1} {Document review of labs and clinical decision tools ie heart score, Chads2Vasc2 etc:1}  {Document your independent review of radiology images, and any outside records:1} {Document your discussion with family members, caretakers, and with consultants:1} {Document social determinants of health affecting pt's care:1} {Document your decision making why or why not admission, treatments were needed:1} Final Clinical Impression(s) / ED Diagnoses Final diagnoses:  None    Rx / DC Orders ED Discharge Orders     None

## 2022-05-22 NOTE — Consult Note (Signed)
Cardiology Consultation   Patient ID: Steven Williams MRN: [id number]; DOB: 12-Oct-1929  Admit date: 05/22/2022 Date of Consult: 05/22/2022  PCP:  Ginger Organ., MD   [facility] Providers Cardiologist:  None  Electrophysiologist:  Thompson Grayer, MD  { Click here to update MD or APP on Care Team, Refresh:1}     Patient Profile:   Steven Williams is a 88 y.o. male with a hx of *** who is being seen 05/22/2022 for the evaluation of *** at the request of ***.  History of Present Illness:   [name] ***   Past Medical History:  Diagnosis Date   Antiphospholipid antibody positive    Aortic aneurysm [facility])    thoracic, Dr Roxan Hockey, with h/o dissection   BPH (benign prostatic hyperplasia)    Central retinal artery occlusion    Chronic anemia    Chronic renal insufficiency    COPD (chronic obstructive pulmonary disease) (HCC)    DDD (degenerative disc disease), cervical    DJD (degenerative joint disease)    DVT (deep venous thrombosis) (HCC)    Gastric dysmotility    GERD (gastroesophageal reflux disease)    Gout    HTN (hypertension)    Hyperlipidemia    Hypertensive retinopathy    OU   Hypothyroid    Macular degeneration    Myelodysplasia (myelodysplastic syndrome) (HCC)    Paget's disease of bone    Peripheral neuropathy    Permanent atrial fibrillation (HCC)    Recurrent epistaxis 06/26/2020   Rheumatoid arthritis (HCC)    TIA (transient ischemic attack)     Past Surgical History:  Procedure Laterality Date   ABDOMINAL AORTOGRAM W/LOWER EXTREMITY Left 04/24/2022   Procedure: ABDOMINAL AORTOGRAM W/LOWER EXTREMITY;  Surgeon: Cherre Robins, MD;  Location: [facility];  Service: Cardiovascular;  Laterality: Left;   ATRIAL FIBRILLATION ABLATION  09/23/2014   Dr Encarnacion Chu in [place]     lumbar, x 2   CAROTID ENDARTERECTOMY Right 1990   CAROTID STENT     CATARACT EXTRACTION Bilateral 2010   [facility]   EYE SURGERY Bilateral  2010   Cat Sx   LUNG DECORTICATION     VATS   PACEMAKER GENERATOR CHANGE  12/03/2011   MDT [facility] ADDR01 generator change by Dr Encarnacion Chu for symptomatic bradycardia   PACEMAKER IMPLANT  2004   MDT      {Home Medications (Optional):21181}  Inpatient Medications: Scheduled Meds:  Continuous Infusions:  PRN Meds:   Allergies:    Allergies  Allergen Reactions   Iodinated Contrast Media Rash    Social History:   Social History   Socioeconomic History   Marital status: Widowed    Spouse name: Not on file   Number of children: 3   Years of education: Not on file   Highest education level: Bachelor's degree (e.g., BA, AB, BS)  Occupational History   Occupation: retired    Comment: retired [employer]  Tobacco Use   Smoking status: Former    Packs/day: 2.00    Years: 12.00    Total pack years: 24.00    Types: Cigarettes    Quit date: 09/07/1962    Years since quitting: 59.7   Smokeless tobacco: Never  Vaping Use   Vaping Use: Never used  Substance and Sexual Activity   Alcohol use: Yes    Alcohol/week: 2.0 standard drinks of alcohol    Types: 2 Glasses of wine per week    Comment: daily  Drug use: Never   Sexual activity: Not Currently  Other Topics Concern   Not on file  Social History Narrative   Previously lived in [place]   03/12/20 Now lives in [place]   Retired Chief Financial Officer   Caffeine 2 a day   Social Determinants of Radio broadcast assistant Strain: Not on file  Food Insecurity: Not on file  Transportation Needs: Not on file  Physical Activity: Not on file  Stress: Not on file  Social Connections: Not on file  Intimate Partner Violence: Not on file    Family History:   *** Family History  Problem Relation Age of Onset   Cirrhosis Mother    Stroke Father    Lung cancer Sister    Lung cancer Brother      ROS:  Please see the history of present illness.  *** All other ROS reviewed and negative.     Physical Exam/Data:    Vitals:   05/22/22 2030 05/22/22 2130 05/22/22 2145 05/22/22 2230  BP: 120/71 108/73  114/67  Pulse: (!) 52 (!) 106 (!) 106 88  Resp: 16 (!) 29 (!) 22 20  Temp: 97.6 F (36.4 C)     TempSrc: Oral     SpO2: 100% 95% 91% 94%  Weight:      Height:        Intake/Output Summary (Last 24 hours) at 05/22/2022 2325 Last data filed at 05/22/2022 1820 Gross per 24 hour  Intake --  Output 200 ml  Net -200 ml      05/22/2022   12:27 PM 05/15/2022    6:27 AM 05/13/2022    4:06 AM  Last 3 Weights  Weight (lbs) 260 lb 262 lb 266 lb  Weight (kg) 117.935 kg 118.842 kg 120.657 kg     Body mass index is 33.38 kg/m.  General:  Well nourished, well developed, in no acute distress*** HEENT: normal Neck: no JVD Vascular: No carotid bruits; Distal pulses 2+ bilaterally Cardiac:  normal S1, S2; RRR; no murmur *** Lungs:  clear to auscultation bilaterally, no wheezing, rhonchi or rales  Abd: soft, nontender, no hepatomegaly  Ext: no edema Musculoskeletal:  No deformities, BUE and BLE strength normal and equal Skin: warm and dry  Neuro:  CNs 2-12 intact, no focal abnormalities noted Psych:  Normal affect   EKG:  The EKG was personally reviewed and demonstrates:  *** Telemetry:  Telemetry was personally reviewed and demonstrates:  ***  Relevant CV Studies: ***  Laboratory Data:  High Sensitivity Troponin:   Recent Labs  Lab 05/09/22 1330 05/09/22 1600 05/22/22 1815 05/22/22 2146  TROPONINIHS 25* 23* 25* 34*     Chemistry Recent Labs  Lab 05/22/22 1251  NA 145  K 3.4*  CL 107  CO2 27  GLUCOSE 104*  BUN 38*  CREATININE 1.95*  CALCIUM 11.6*  GFRNONAA 32*  ANIONGAP 11    No results for input(s): "PROT", "ALBUMIN", "AST", "ALT", "ALKPHOS", "BILITOT" in the last 168 hours. Lipids No results for input(s): "CHOL", "TRIG", "HDL", "LABVLDL", "LDLCALC", "CHOLHDL" in the last 168 hours.  Hematology Recent Labs  Lab 05/22/22 1251  WBC 5.5  RBC 2.68*  HGB 8.8*  HCT 28.0*  MCV  104.5*  MCH 32.8  MCHC 31.4  RDW 15.2  PLT 168   Thyroid No results for input(s): "TSH", "FREET4" in the last 168 hours.  BNPNo results for input(s): "BNP", "PROBNP" in the last 168 hours.  DDimer No results for input(s): "DDIMER"  in the last 168 hours.   Radiology/Studies:  DG Chest 1 View  Result Date: 05/22/2022 CLINICAL DATA:  Shortness of breath. EXAM: CHEST  1 VIEW COMPARISON:  AP chest 05/10/2022, chest two views 05/10/2022, CT chest 05/09/2022 FINDINGS: Left chest wall cardiac pacer with leads overlying the right atrium and right ventricle. Cardiac silhouette is again moderately to markedly enlarged. Mediastinal contours are within normal limits. Moderate calcification within the aortic arch. Moderate right and small left pleural effusions appear similar to the two views of the chest 05/10/2022 study at 6:28 a.m. There was less opacification from pleural fluid on the 05/10/2022 AP view of the chest at 9:58 a.m. following interval thoracentesis. Moderate bibasilar heterogeneous airspace opacities. No pneumothorax is seen. No acute skeletal abnormality. Moderate multilevel degenerative bridging osteophytes of the thoracic spine. IMPRESSION: 1. Moderate right and small left pleural effusions. This appears very similar to the 05/10/2022 radiographs prior to thoracentesis later that day. 2. Bibasilar heterogeneous airspace opacities, likely atelectasis. Cannot exclude underlying pneumonia. Electronically Signed   By: Yvonne Kendall M.D.   On: 05/22/2022 13:32     Assessment and Plan:   ***   Risk Assessment/Risk Scores:  {Complete the following score calculators/questions to meet required metrics.  Press F2         :[id number]}   {Is the patient being seen for unstable angina, ACS, NSTEMI or STEMI?:[phone]} {Does this patient have CHF or CHF symptoms?      :[id number]} {Does this patient have ATRIAL FIBRILLATION?:[phone]}  {Are we signing off today?:[id number]}  For questions or  updates, please contact [facility] Please consult [email or web address] for contact info under    Signed, Warren Danes, MD  05/22/2022 11:25 PM

## 2022-05-22 NOTE — ED Notes (Signed)
RN called lab regarding blood work still not in process. Lab tech states they have blood and will be running them shortly.

## 2022-05-22 NOTE — Discharge Instructions (Signed)
Try to drink more fluids at the rehab center and you will need to have your blood chemistries checked again next week, to make sure you are not getting too dehydrated again

## 2022-05-22 NOTE — ED Triage Notes (Signed)
Per [facility] pt coming from [facility]- patients daughter called EMS to transport patient for IV fluids. Patient placed on 3L [place] due to RA 87%. Patient has not taken any oral intake over past 5 days. According to staff patient is at baseline. Hx of CHF and kidney disease. Pressure sore to left foot wrapped on arrival.

## 2022-05-22 NOTE — ED Notes (Signed)
This RN attempted in and out cath for patient with assistance from [facility] and was unsuccessful. Will make provider aware of same.

## 2022-05-22 NOTE — ED Provider Notes (Signed)
  Physical Exam  BP 114/67   Pulse 88   Temp 97.6 F (36.4 C) (Oral)   Resp 20   Ht '6\' 2"'$  (1.88 m)   Wt 117.9 kg   SpO2 94%   BMI 33.38 kg/m     ED Course / MDM   Clinical Course as of 05/22/22 2359  Fri May 22, 2022  1605 Received signout from Dr. Roderic Palau, 88 y/o presenting from nursing facility for generalized weakness. Electrolytes reassuring. Getting fluids and having urinalysis checked.  [WS]  2332 Discussed  with daughter.  Suspect confusion is most likely delirium given frequent hospitalizations and waxing and waning mental status.  Patient noted to have mild hypoxia, placed on oxygen by nursing.  Discussed with Dr. Wilmon Pali elevated troponin and EKG changes.  He believes that this is due to digoxin toxicity even though his level is within normal.  He recommends patient be admitted.  Also discussed with Dr. Bridgett Larsson of hospital medicine, who will come and evaluate the patient.  Dr. Donato Schultz also come and evaluate the patient. [WS]  2359 Patient admitted to the medicine service. [WS]    Clinical Course User Index [WS] Cristie Hem, MD   Medical Decision Making Amount and/or Complexity of Data Reviewed Labs: ordered. Radiology: ordered. ECG/medicine tests: ordered.  Risk Prescription drug management. Decision regarding hospitalization.          Cristie Hem, MD 05/22/22 [id number]

## 2022-05-23 ENCOUNTER — Encounter (HOSPITAL_COMMUNITY): Payer: Self-pay | Admitting: Internal Medicine

## 2022-05-23 DIAGNOSIS — R410 Disorientation, unspecified: Secondary | ICD-10-CM

## 2022-05-23 DIAGNOSIS — R627 Adult failure to thrive: Secondary | ICD-10-CM | POA: Diagnosis present

## 2022-05-23 DIAGNOSIS — E038 Other specified hypothyroidism: Secondary | ICD-10-CM

## 2022-05-23 DIAGNOSIS — D631 Anemia in chronic kidney disease: Secondary | ICD-10-CM | POA: Diagnosis present

## 2022-05-23 DIAGNOSIS — N184 Chronic kidney disease, stage 4 (severe): Secondary | ICD-10-CM

## 2022-05-23 DIAGNOSIS — L89610 Pressure ulcer of right heel, unstageable: Secondary | ICD-10-CM | POA: Diagnosis present

## 2022-05-23 DIAGNOSIS — D6861 Antiphospholipid syndrome: Secondary | ICD-10-CM | POA: Diagnosis present

## 2022-05-23 DIAGNOSIS — E870 Hyperosmolality and hypernatremia: Secondary | ICD-10-CM | POA: Diagnosis present

## 2022-05-23 DIAGNOSIS — I712 Thoracic aortic aneurysm, without rupture, unspecified: Secondary | ICD-10-CM | POA: Diagnosis present

## 2022-05-23 DIAGNOSIS — I130 Hypertensive heart and chronic kidney disease with heart failure and stage 1 through stage 4 chronic kidney disease, or unspecified chronic kidney disease: Secondary | ICD-10-CM | POA: Diagnosis present

## 2022-05-23 DIAGNOSIS — L89159 Pressure ulcer of sacral region, unspecified stage: Secondary | ICD-10-CM | POA: Diagnosis present

## 2022-05-23 DIAGNOSIS — E669 Obesity, unspecified: Secondary | ICD-10-CM | POA: Diagnosis present

## 2022-05-23 DIAGNOSIS — Z515 Encounter for palliative care: Secondary | ICD-10-CM | POA: Diagnosis not present

## 2022-05-23 DIAGNOSIS — G9341 Metabolic encephalopathy: Secondary | ICD-10-CM | POA: Diagnosis present

## 2022-05-23 DIAGNOSIS — I5032 Chronic diastolic (congestive) heart failure: Secondary | ICD-10-CM

## 2022-05-23 DIAGNOSIS — R760 Raised antibody titer: Secondary | ICD-10-CM

## 2022-05-23 DIAGNOSIS — Z66 Do not resuscitate: Secondary | ICD-10-CM | POA: Diagnosis present

## 2022-05-23 DIAGNOSIS — Z7189 Other specified counseling: Secondary | ICD-10-CM

## 2022-05-23 DIAGNOSIS — I482 Chronic atrial fibrillation, unspecified: Secondary | ICD-10-CM | POA: Diagnosis not present

## 2022-05-23 DIAGNOSIS — L89620 Pressure ulcer of left heel, unstageable: Secondary | ICD-10-CM | POA: Diagnosis present

## 2022-05-23 DIAGNOSIS — N179 Acute kidney failure, unspecified: Secondary | ICD-10-CM | POA: Diagnosis present

## 2022-05-23 DIAGNOSIS — J90 Pleural effusion, not elsewhere classified: Secondary | ICD-10-CM

## 2022-05-23 DIAGNOSIS — J9811 Atelectasis: Secondary | ICD-10-CM | POA: Diagnosis present

## 2022-05-23 DIAGNOSIS — D469 Myelodysplastic syndrome, unspecified: Secondary | ICD-10-CM | POA: Diagnosis present

## 2022-05-23 DIAGNOSIS — G2581 Restless legs syndrome: Secondary | ICD-10-CM | POA: Diagnosis present

## 2022-05-23 DIAGNOSIS — E039 Hypothyroidism, unspecified: Secondary | ICD-10-CM | POA: Diagnosis present

## 2022-05-23 DIAGNOSIS — I10 Essential (primary) hypertension: Secondary | ICD-10-CM

## 2022-05-23 DIAGNOSIS — J449 Chronic obstructive pulmonary disease, unspecified: Secondary | ICD-10-CM | POA: Diagnosis present

## 2022-05-23 DIAGNOSIS — M069 Rheumatoid arthritis, unspecified: Secondary | ICD-10-CM | POA: Diagnosis present

## 2022-05-23 DIAGNOSIS — I4821 Permanent atrial fibrillation: Secondary | ICD-10-CM | POA: Diagnosis present

## 2022-05-23 LAB — CBC WITH DIFFERENTIAL/PLATELET
Abs Immature Granulocytes: 0.02 10*3/uL (ref 0.00–0.07)
Basophils Absolute: 0.1 10*3/uL (ref 0.0–0.1)
Basophils Relative: 1 %
Eosinophils Absolute: 0.3 10*3/uL (ref 0.0–0.5)
Eosinophils Relative: 7 %
HCT: 26.5 % — ABNORMAL LOW (ref 39.0–52.0)
Hemoglobin: 8.3 g/dL — ABNORMAL LOW (ref 13.0–17.0)
Immature Granulocytes: 0 %
Lymphocytes Relative: 17 %
Lymphs Abs: 0.8 10*3/uL (ref 0.7–4.0)
MCH: 32.5 pg (ref 26.0–34.0)
MCHC: 31.3 g/dL (ref 30.0–36.0)
MCV: 103.9 fL — ABNORMAL HIGH (ref 80.0–100.0)
Monocytes Absolute: 0.6 10*3/uL (ref 0.1–1.0)
Monocytes Relative: 13 %
Neutro Abs: 2.9 10*3/uL (ref 1.7–7.7)
Neutrophils Relative %: 62 %
Platelets: 162 10*3/uL (ref 150–400)
RBC: 2.55 MIL/uL — ABNORMAL LOW (ref 4.22–5.81)
RDW: 15.2 % (ref 11.5–15.5)
WBC: 4.7 10*3/uL (ref 4.0–10.5)
nRBC: 0 % (ref 0.0–0.2)

## 2022-05-23 LAB — COMPREHENSIVE METABOLIC PANEL
ALT: 13 U/L (ref 0–44)
AST: 22 U/L (ref 15–41)
Albumin: 2.6 g/dL — ABNORMAL LOW (ref 3.5–5.0)
Alkaline Phosphatase: 143 U/L — ABNORMAL HIGH (ref 38–126)
Anion gap: 12 (ref 5–15)
BUN: 36 mg/dL — ABNORMAL HIGH (ref 8–23)
CO2: 28 mmol/L (ref 22–32)
Calcium: 11.3 mg/dL — ABNORMAL HIGH (ref 8.9–10.3)
Chloride: 106 mmol/L (ref 98–111)
Creatinine, Ser: 1.87 mg/dL — ABNORMAL HIGH (ref 0.61–1.24)
GFR, Estimated: 34 mL/min — ABNORMAL LOW (ref >60)
Glucose, Bld: 95 mg/dL (ref 70–99)
Potassium: 3.1 mmol/L — ABNORMAL LOW (ref 3.5–5.1)
Sodium: 146 mmol/L — ABNORMAL HIGH (ref 135–145)
Total Bilirubin: 0.9 mg/dL (ref 0.3–1.2)
Total Protein: 5.5 g/dL — ABNORMAL LOW (ref 6.5–8.1)

## 2022-05-23 LAB — BRAIN NATRIURETIC PEPTIDE: B Natriuretic Peptide: 305.6 pg/mL — ABNORMAL HIGH (ref 0.0–100.0)

## 2022-05-23 LAB — MAGNESIUM: Magnesium: 1.8 mg/dL (ref 1.7–2.4)

## 2022-05-23 MED ORDER — ASPIRIN 81 MG PO TBEC
81.0000 mg | DELAYED_RELEASE_TABLET | Freq: Every day | ORAL | Status: DC
  Administered 2022-05-23: 81 mg via ORAL
  Filled 2022-05-23: qty 1

## 2022-05-23 MED ORDER — LEVOTHYROXINE SODIUM 100 MCG PO TABS
100.0000 ug | ORAL_TABLET | Freq: Every day | ORAL | Status: DC
  Administered 2022-05-23: 100 ug via ORAL
  Filled 2022-05-23: qty 1

## 2022-05-23 MED ORDER — ACETAMINOPHEN 650 MG RE SUPP: 650.0000 mg | Freq: Four times a day (QID) | RECTAL | Status: DC | PRN

## 2022-05-23 MED ORDER — LORAZEPAM 2 MG/ML IJ SOLN
1.0000 mg | INTRAMUSCULAR | Status: DC | PRN
  Administered 2022-05-23 – 2022-05-24 (×2): 1 mg via INTRAVENOUS
  Filled 2022-05-23 (×2): qty 1

## 2022-05-23 MED ORDER — PANTOPRAZOLE SODIUM 40 MG PO TBEC
40.0000 mg | DELAYED_RELEASE_TABLET | Freq: Every day | ORAL | Status: DC
  Administered 2022-05-23: 40 mg via ORAL
  Filled 2022-05-23: qty 1

## 2022-05-23 MED ORDER — ONDANSETRON HCL 4 MG PO TABS: 4.0000 mg | ORAL_TABLET | Freq: Four times a day (QID) | ORAL | Status: DC | PRN

## 2022-05-23 MED ORDER — POTASSIUM CHLORIDE 10 MEQ/100ML IV SOLN
10.0000 meq | INTRAVENOUS | Status: AC
  Administered 2022-05-23 (×2): 10 meq via INTRAVENOUS
  Filled 2022-05-23 (×2): qty 100

## 2022-05-23 MED ORDER — ONDANSETRON HCL 4 MG/2ML IJ SOLN: 4.0000 mg | Freq: Four times a day (QID) | INTRAMUSCULAR | Status: DC | PRN

## 2022-05-23 MED ORDER — MORPHINE SULFATE (PF) 2 MG/ML IV SOLN
2.0000 mg | INTRAVENOUS | Status: DC | PRN
  Administered 2022-05-23 – 2022-05-24 (×2): 2 mg via INTRAVENOUS
  Filled 2022-05-23 (×3): qty 1

## 2022-05-23 MED ORDER — FUROSEMIDE 40 MG PO TABS
40.0000 mg | ORAL_TABLET | Freq: Every day | ORAL | Status: DC
  Administered 2022-05-23: 40 mg via ORAL
  Filled 2022-05-23: qty 1

## 2022-05-23 MED ORDER — ROPINIROLE HCL 1 MG PO TABS
3.0000 mg | ORAL_TABLET | Freq: Every day | ORAL | Status: DC
  Filled 2022-05-23: qty 3

## 2022-05-23 MED ORDER — ACETAMINOPHEN 325 MG PO TABS
650.0000 mg | ORAL_TABLET | Freq: Four times a day (QID) | ORAL | Status: DC | PRN
  Administered 2022-05-23: 650 mg via ORAL

## 2022-05-23 MED ORDER — MORPHINE SULFATE (CONCENTRATE) 10 MG/0.5ML PO SOLN
5.0000 mg | ORAL | Status: DC | PRN
  Filled 2022-05-23: qty 0.5

## 2022-05-23 MED ORDER — ATORVASTATIN CALCIUM 40 MG PO TABS
40.0000 mg | ORAL_TABLET | Freq: Every day | ORAL | Status: DC
  Administered 2022-05-23: 40 mg via ORAL
  Filled 2022-05-23: qty 1

## 2022-05-23 MED ORDER — FUROSEMIDE 10 MG/ML IJ SOLN
40.0000 mg | Freq: Once | INTRAMUSCULAR | Status: AC
  Administered 2022-05-23: 40 mg via INTRAVENOUS
  Filled 2022-05-23: qty 4

## 2022-05-23 MED ORDER — CARVEDILOL 3.125 MG PO TABS
3.1250 mg | ORAL_TABLET | Freq: Two times a day (BID) | ORAL | Status: DC
  Administered 2022-05-23: 3.125 mg via ORAL
  Filled 2022-05-23: qty 1

## 2022-05-23 MED ORDER — TAMSULOSIN HCL 0.4 MG PO CAPS
0.4000 mg | ORAL_CAPSULE | Freq: Every day | ORAL | Status: DC
  Administered 2022-05-23: 0.4 mg via ORAL
  Filled 2022-05-23: qty 1

## 2022-05-23 MED ORDER — MORPHINE SULFATE (CONCENTRATE) 10 MG/0.5ML PO SOLN: 5.0000 mg | ORAL | Status: DC | PRN

## 2022-05-23 MED ORDER — GLYCOPYRROLATE 0.2 MG/ML IJ SOLN
0.1000 mg | Freq: Three times a day (TID) | INTRAMUSCULAR | Status: DC
  Administered 2022-05-24 (×2): 0.1 mg via INTRAVENOUS
  Filled 2022-05-23 (×5): qty 0.5

## 2022-05-23 NOTE — Progress Notes (Signed)
PROGRESS NOTE        PATIENT DETAILS Name: Steven Williams Age: 88 y.o. Sex: male Date of Birth: [birth year] Admit Date: 05/22/2022 Admitting Physician Kristopher Oppenheim, DO ZOX:WRUE, Emily Filbert., MD  Brief Summary: Patient is a 88 y.o.  male with history of HFpEF, chronic right pleural effusion (transudative), CKD stage IV, recurrent epistaxis-who was recently hospitalized for HFpEF exacerbation-subsequently sent to SNF-presented back to the ED with confusion, poor oral intake and severe failure to thrive syndrome.  Significant events: 9/2-9/8>> hospitalization for HFpEF exacerbation/underwent thoracocentesis-discharged to SNF. 9/15>> presented to ED with confusion-poor oral intake-admit to [facility]  Significant studies: 9/15>> CXR: Moderate right pleural effusion-bibasilar atelectasis.  Significant microbiology data: 9/15>> urine culture: Pending  Procedures: None  Consults: Palliative care  Subjective: Confused-moves all 4 extremities.  Not in any distress.  Objective: Vitals: Blood pressure (!) 112/54, pulse (!) 57, temperature (!) 97.5 F (36.4 C), temperature source Oral, resp. rate 16, height '6\' 2"'$  (1.88 m), weight 117.9 kg, SpO2 100 %.   Exam: Gen Exam:not in any distress HEENT:atraumatic, normocephalic Chest: B/L clear to auscultation anteriorly CVS:S1S2 regular Abdomen:soft non tender, non distended Extremities:no edema Neurology: Non focal-but with generalized weakness. Skin: no rash  Pertinent Labs/Radiology:    Latest Ref Rng & Units 05/23/2022    5:38 AM 05/22/2022   12:51 PM 05/11/2022    2:03 AM  CBC  WBC 4.0 - 10.5 K/uL 4.7  5.5  9.1   Hemoglobin 13.0 - 17.0 g/dL 8.3  8.8  8.7   Hematocrit 39.0 - 52.0 % 26.5  28.0  26.7   Platelets 150 - 400 K/uL 162  168  169     Lab Results  Component Value Date   NA 146 (H) 05/23/2022   K 3.1 (L) 05/23/2022   CL 106 05/23/2022   CO2 28 05/23/2022      Assessment/Plan: Acute metabolic  encephalopathy with severe failure to thrive syndrome/poor oral intake: Unfortunately-has been declining over the past several months-previously at ALF-now at [facility].  No clear-cut source for encephalopathy-plan is to provide supportive care-and gentle medical treatment-daughter aware of poor overall prognosis-suspect will do best with initiation of hospice/palliative care.  Await further recommendations from the palliative care team.  Chronic right pleural effusion: Transudative-unlikely related to HFpEF.  He is currently asymptomatic-lying flat-do not think there is any symptomatic benefit in pursuing a thoracocentesis-suspect risks outweigh any benefits.  Discussed with daughter [name]-thoracocentesis canceled.  Chronic HFpEF: Volume status is stable-suspect he may actually be on the dry side.  Hold diuretics.  HTN: BP stable-continue Coreg  HLD: Continue statin  CKD stage IV: Creatinine close to baseline.  Normocytic anemia: Due to CKD-no indication to transfuse.  Follow periodically.  Hypernatremia: Mild-encourage oral intake.  Recurrent epistaxis: No evidence of recurrence-off anticoagulation.  History of antiphospholipid syndrome: Not on anticoagulation due to recurrent epistaxis.  Hypothyroidism: Continue Synthroid  RLS: On Requip  BPH: On Flomax-watch for urinary retention.  Unstageable sacral decubitus ulcer/right heel ulcer: All present prior to hospitalization-await wound care evaluation.  Palliative care: DNR in place.  Long discussion with daughter Stanton Kidney over the phone this morning-patient has been declining over the past several months-now with severe failure to thrive syndrome-poor oral intake-confusion.  Suspect that he is getting close to end-of-life-as no clear-cut etiology for his ongoing symptoms-recommended that patient would best benefit from initiation  of hospice/palliative care upon discharge to SNF/ALF.  Await formal palliative care evaluation.  Do not plan any further  escalation in care-do not think a thoracocentesis will benefit his symptoms as he is really not short of breath.  Pressure Ulcer: Pressure Injury 05/09/22 Heel Right Deep Tissue Pressure Injury - Purple or maroon localized area of discolored intact skin or blood-filled blister due to damage of underlying soft tissue from pressure and/or shear. (Active)  05/09/22 2100  Location: Heel  Location Orientation: Right  Staging: Deep Tissue Pressure Injury - Purple or maroon localized area of discolored intact skin or blood-filled blister due to damage of underlying soft tissue from pressure and/or shear.  Wound Description (Comments):   Present on Admission: Yes  Dressing Type Gauze (Comment) 05/23/22 0449    Obesity: Estimated body mass index is 33.38 kg/m as calculated from the following:   Height as of this encounter: '6\' 2"'$  (1.88 m).   Weight as of this encounter: 117.9 kg.   Code status:   Code Status: DNR   DVT Prophylaxis: SCDs Start: 05/23/22 0103   Family Communication: Daughter-[name]-updated over the phone on 9/16   Disposition Plan: Status is: Observation The patient will require care spanning > 2 midnights and should be moved to inpatient because: Severe failure to thrive syndrome-poor oral intake-confusion-not yet stable for discharge.  Awaiting further goals of care discussion with family.   Planned Discharge Destination:Skilled nursing facility   Diet: Diet Order             Diet Heart Room service appropriate? Yes; Fluid consistency: Thin  Diet effective now                     Antimicrobial agents: Anti-infectives (From admission, onward)    None        MEDICATIONS: Scheduled Meds:  aspirin EC  81 mg Oral Daily   atorvastatin  40 mg Oral Daily   carvedilol  3.125 mg Oral BID WC   furosemide  40 mg Oral Daily   levothyroxine  100 mcg Oral Daily   pantoprazole  40 mg Oral Daily   rOPINIRole  3 mg Oral QHS   tamsulosin  0.4 mg Oral  Daily   Continuous Infusions: PRN Meds:.acetaminophen **OR** acetaminophen, ondansetron **OR** ondansetron (ZOFRAN) IV   I have personally reviewed following labs and imaging studies  LABORATORY DATA: CBC: Recent Labs  Lab 05/22/22 1251 05/23/22 0538  WBC 5.5 4.7  NEUTROABS 3.4 2.9  HGB 8.8* 8.3*  HCT 28.0* 26.5*  MCV 104.5* 103.9*  PLT 168 811    Basic Metabolic Panel: Recent Labs  Lab 05/22/22 1251 05/23/22 0538  NA 145 146*  K 3.4* 3.1*  CL 107 106  CO2 27 28  GLUCOSE 104* 95  BUN 38* 36*  CREATININE 1.95* 1.87*  CALCIUM 11.6* 11.3*  MG  --  1.8    GFR: Estimated Creatinine Clearance: 35.8 mL/min (A) (by C-G formula based on SCr of 1.87 mg/dL (H)).  Liver Function Tests: Recent Labs  Lab 05/23/22 0538  AST 22  ALT 13  ALKPHOS 143*  BILITOT 0.9  PROT 5.5*  ALBUMIN 2.6*   No results for input(s): "LIPASE", "AMYLASE" in the last 168 hours. No results for input(s): "AMMONIA" in the last 168 hours.  Coagulation Profile: No results for input(s): "INR", "PROTIME" in the last 168 hours.  Cardiac Enzymes: No results for input(s): "CKTOTAL", "CKMB", "CKMBINDEX", "TROPONINI" in the last 168 hours.  BNP (last 3 results)  No results for input(s): "PROBNP" in the last 8760 hours.  Lipid Profile: No results for input(s): "CHOL", "HDL", "LDLCALC", "TRIG", "CHOLHDL", "LDLDIRECT" in the last 72 hours.  Thyroid Function Tests: No results for input(s): "TSH", "T4TOTAL", "FREET4", "T3FREE", "THYROIDAB" in the last 72 hours.  Anemia Panel: No results for input(s): "VITAMINB12", "FOLATE", "FERRITIN", "TIBC", "IRON", "RETICCTPCT" in the last 72 hours.  Urine analysis:    Component Value Date/Time   COLORURINE YELLOW 05/22/2022 1256   APPEARANCEUR HAZY (A) 05/22/2022 1256   LABSPEC 1.010 05/22/2022 1256   PHURINE 5.0 05/22/2022 1256   GLUCOSEU NEGATIVE 05/22/2022 1256   HGBUR NEGATIVE 05/22/2022 1256   BILIRUBINUR NEGATIVE 05/22/2022 1256   KETONESUR  NEGATIVE 05/22/2022 1256   PROTEINUR NEGATIVE 05/22/2022 1256   NITRITE NEGATIVE 05/22/2022 1256   LEUKOCYTESUR LARGE (A) 05/22/2022 1256    Sepsis Labs: Lactic Acid, Venous    Component Value Date/Time   LATICACIDVEN 1.3 05/01/2022 1330    MICROBIOLOGY: No results found for this or any previous visit (from the past 240 hour(s)).  RADIOLOGY STUDIES/RESULTS: DG Chest 1 View  Result Date: 05/22/2022 CLINICAL DATA:  Shortness of breath. EXAM: CHEST  1 VIEW COMPARISON:  AP chest 05/10/2022, chest two views 05/10/2022, CT chest 05/09/2022 FINDINGS: Left chest wall cardiac pacer with leads overlying the right atrium and right ventricle. Cardiac silhouette is again moderately to markedly enlarged. Mediastinal contours are within normal limits. Moderate calcification within the aortic arch. Moderate right and small left pleural effusions appear similar to the two views of the chest 05/10/2022 study at 6:28 a.m. There was less opacification from pleural fluid on the 05/10/2022 AP view of the chest at 9:58 a.m. following interval thoracentesis. Moderate bibasilar heterogeneous airspace opacities. No pneumothorax is seen. No acute skeletal abnormality. Moderate multilevel degenerative bridging osteophytes of the thoracic spine. IMPRESSION: 1. Moderate right and small left pleural effusions. This appears very similar to the 05/10/2022 radiographs prior to thoracentesis later that day. 2. Bibasilar heterogeneous airspace opacities, likely atelectasis. Cannot exclude underlying pneumonia. Electronically Signed   By: Yvonne Kendall M.D.   On: 05/22/2022 13:32     LOS: 0 days   Oren Binet, MD  [facility]    To contact the attending provider between 7A-7P or the covering provider during after hours 7P-7A, please log into the web site [email or web address] and access using universal [place] password for that web site. If you do not have the password, please call the hospital operator.  05/23/2022,  9:16 AM

## 2022-05-23 NOTE — Assessment & Plan Note (Signed)
Stable. This is the reason pt is not on systemic anticoagulation.

## 2022-05-23 NOTE — Consult Note (Signed)
Palliative Medicine Inpatient Consult Note  Consulting Provider: Jonetta Osgood, MD  Reason for consult:   [place] Palliative Medicine Consult  Reason for Consult? patient w severe FTT-DNR-daughter struggling with options-but understands poor prognoses.   05/23/2022  HPI:  Per intake H&P --> Patient is a 88 y.o.  male with history of chronic right pleural effusion (transudative), CKD stage IV, recurrent epistaxis-who was recently hospitalized for HFpEF exacerbation-subsequently sent to SNF-presented back to the ED with confusion, poor oral intake and severe failure to thrive syndrome.    Palliative care asked to get involved to discuss goals of care in the setting of recurrent rehospitalization's, delirium, and high chronic disease burden.  Clinical Assessment/Goals of Care:  *Please note that this is a verbal dictation therefore any spelling or grammatical errors are due to the "[place]" system interpretation.  I have reviewed medical records including EPIC notes, labs and imaging, received report from bedside RN, assessed the patient.    I met with patient's son, Jenny Reichmann and called daughter Kyra Manges to further discuss diagnosis prognosis, GOC, EOL wishes, disposition and options.   I introduced Palliative Medicine as specialized medical care for people living with serious illness. It focuses on providing relief from the symptoms and stress of a serious illness. The goal is to improve quality of life for both the patient and the family.  Medical History Review and Understanding:  Reviewed patient's past medical history of heart failure, chronic kidney disease, degenerative disc disease, anemia, hypertension, hypothyroidism, rheumatoid arthritis, and macular degeneration.  Social History:  Reviewed that Bryce is originally from [place].  He has 3 children and 8 grandchildren.  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  He is  a man of faith and practices within Catholicism.  He is identified to be a boisterous person with a very enduring personality..  Functional and Nutritional State:  Functionally Haniel has been declining for the last month and has been ping-pong in between the acute care hospital and [facility].  Per his family he has been worsening and the rehab did not seem to help his ongoing delirium.  Ranvir is of recently has no appetite and is not eating nor is he drinking.  Advance Directives:  A detailed discussion was had today regarding advanced directives.  Patient's 3 children make decisions regarding Jameses care together.  Code Status:  Tavis is an established DNAR/DNI CODE STATUS.  Discussion:  We discussed that unfortunately in the past month Muaaz has had a fairly severe decline in his physical, functional, and mental state.  He has had a lingering delirium which his daughter Jossie Ng attributes to his recurrent rehospitalization's.  We discussed that most recently Verbon was sent from [facility] for adult failure to thrive.  She expresses that he has had no want need or desire to eat or drink at all.  We reviewed that unfortunately delirium can be a terminal prognosis and that patients can experience similar symptoms.  We also attributed that Azari has a very high chronic disease burden and of course is older.  Because of this even a small incident or hospitalization can have a tremendous impact on his long-term health care trajectory.  We reviewed the chronic disease trajectory and how you tend to have a domino effect and a downward spiral which becomes more persistent.  We reviewed that patients are typically never able to get back up to their normal baseline level of function and as  each acute event occurs they continue to deteriorate.  We discussed that patient's family had had very prudent conversations with Abdirahman when he was in his right frame of mind about what his wishes  would be.  Abhiraj is always clear about not wanting artificial nutrition and allowing for his end-of-life process to be natural.  We reviewed the different options at this point in time inclusive of hospice at his assisted living facility, [facility] versus him going to an inpatient hospice home.  I shared that he looks like he is getting quite close to end-of-life therefore an inpatient hospice home may be a very reasonable consideration.  We further discussed comfort care and what that would look like though both John and Jossie Ng would like to speak to their brother prior to making a decision on that.  We have agreed to meet tomorrow at 10 AM for further conversation.  Discussed the importance of continued conversation with family and their  medical providers regarding overall plan of care and treatment options, ensuring decisions are within the context of the patients values and GOCs.  Decision Maker: Charm Rings (Daughter): [phone] (Home Phone)  SUMMARY OF RECOMMENDATIONS   DNAR/DNI, no artificial nutrition  Plan for family meeting tomorrow at 31 AM  Discussed adult failure to thrive causes and long-term outlook in cases such as Tesean' --> reviewed patient's limited prognosis  Reviewed the difference between hospice and the assisted living facility and hospice in the inpatient setting  Ongoing palliative care support  Code Status/Advance Care Planning: DNAR/DNI   Palliative Prophylaxis:  Aspiration, Bowel Regimen, Delirium Protocol, Frequent Pain Assessment, Oral Care, Palliative Wound Care, and Turn Reposition  Additional Recommendations (Limitations, Scope, Preferences): Continue comfort care  Psycho-social/Spiritual:  Desire for further Chaplaincy support: Yes patient is Catholic Additional Recommendations: Occasional chronic disease burden and trajectory   Prognosis: Quite limited to days suspect  Discharge Planning: Discharge plan uncertain at this  time  Vitals:   05/23/22 0500 05/23/22 0739  BP:  (!) 112/54  Pulse:  (!) 57  Resp:  16  Temp:  (!) 97.5 F (36.4 C)  SpO2: 91% 100%    Intake/Output Summary (Last 24 hours) at 05/23/2022 1716 Last data filed at 05/23/2022 1100 Gross per 24 hour  Intake 191.79 ml  Output 825 ml  Net -633.21 ml   Last Weight  Most recent update: 05/22/2022 12:28 PM    Weight  117.9 kg (260 lb)            Gen: Elderly Caucasian male in no acute distress HEENT: Dry mucous membranes CV: Regular rate and rhythm PULM: On 2 L nasal cannula breathing is even and nonlabored ABD: soft/nontender EXT: No edema Neuro: Disoriented  PPS:  10%   This conversation/these recommendations were discussed with patient primary care team, Dr. Sloan Leiter  Billing based on MDM: High  Problems Addressed: One acute or chronic illness or injury that poses a threat to life or bodily function  Amount and/or Complexity of Data: Category 3:Discussion of management or test interpretation with external physician/other qualified health care professional/appropriate source (not separately reported)  Risks: Decision regarding hospitalization or escalation of hospital care and Decision not to resuscitate or to de-escalate care because of poor prognosis ______________________________________________________ [facility] Team Team Cell Phone: [phone] Please utilize secure chat with additional questions, if there is no response within 30 minutes please call the above phone number  Palliative Medicine Team providers are available by phone from 7am to  7pm daily and can be reached through the team cell phone.  Should this patient require assistance outside of these hours, please call the patient's attending physician.

## 2022-05-23 NOTE — Assessment & Plan Note (Signed)
Chronic. Not on anticoagulation due to recurrent nose bleeds. Hold digoxin. Continue coreg.

## 2022-05-23 NOTE — Evaluation (Signed)
Clinical/Bedside Swallow Evaluation Patient Details  Name: Steven Williams MRN: [id number] Date of Birth: Aug 25, 1930  Today's Date: 05/23/2022 Time: SLP Start Time (ACUTE ONLY): 0930 SLP Stop Time (ACUTE ONLY): 0086 SLP Time Calculation (min) (ACUTE ONLY): 14 min  Past Medical History:  Past Medical History:  Diagnosis Date   Acute renal failure superimposed on stage 4 chronic kidney disease ([place]) 04/18/2022   Antiphospholipid antibody positive    Aortic aneurysm (HCC)    thoracic, Dr Roxan Hockey, with h/o dissection   BPH (benign prostatic hyperplasia)    Central retinal artery occlusion    Chronic anemia    Chronic renal insufficiency    COPD (chronic obstructive pulmonary disease) (HCC)    DDD (degenerative disc disease), cervical    DJD (degenerative joint disease)    DVT (deep venous thrombosis) (HCC)    Gastric dysmotility    GERD (gastroesophageal reflux disease)    Gout    HTN (hypertension)    Hyperlipidemia    Hypertensive retinopathy    OU   Hypothyroid    Macular degeneration    Myelodysplasia (myelodysplastic syndrome) (HCC)    Paget's disease of bone    Peripheral neuropathy    Permanent atrial fibrillation (HCC)    Recurrent epistaxis 06/26/2020   Rheumatoid arthritis (HCC)    TIA (transient ischemic attack)    Past Surgical History:  Past Surgical History:  Procedure Laterality Date   ABDOMINAL AORTOGRAM W/LOWER EXTREMITY Left 04/24/2022   Procedure: ABDOMINAL AORTOGRAM W/LOWER EXTREMITY;  Surgeon: Cherre Robins, MD;  Location: [facility];  Service: Cardiovascular;  Laterality: Left;   ATRIAL FIBRILLATION ABLATION  09/23/2014   Dr Encarnacion Chu in [place]     lumbar, x 2   CAROTID ENDARTERECTOMY Right 1990   CAROTID STENT     CATARACT EXTRACTION Bilateral 2010   [place]   EYE SURGERY Bilateral 2010   [facility]   LUNG DECORTICATION     VATS   PACEMAKER GENERATOR CHANGE  12/03/2011   MDT Adapta [id number] generator change by Dr Encarnacion Chu for  symptomatic bradycardia   PACEMAKER IMPLANT  2004   MDT    HPI:  88 year old Caucasian male with history of chronic atrial fibrillation not currently on anticoagulation due to history of recurrent nosebleeds, history of antiphospholipid positive antibody, chronic diastolic heart failure, recent right pleural effusion status postthoracentesis, CKD stage IV with baseline creatinine 2.1-2.4, left heel decubitus ulcer, sacral decubitus ulcer, acquired hypothyroidism who presents to the ER today from [place] home.  Daughter states patient's had poor p.o. intake for the last few days.  He has become increasingly confused.  Patient was discharged to [facility] on 05/15/2022 after a 7-day hospital stay due to acute CHF.  He was diagnosed with recurrent right pleura effusion and delerium.  Most recent chest xray was showing moder right and small left pleural effusion similiar to 09/03 radiographs and bibasilar heterogenous airspace opacities; can not exclude pneumonia.    Assessment / Plan / Recommendation  Clinical Impression  Clinical swallowing evaluation was completed using thin liquid via spoon and straw, pureed material and soft solids.  He is known to [place] with previous clinical swallowing evaluation that was completed on 05/10/22.  Recommendation at that time was for a regular textured diet with full range of liquids with no ST follow up.  GI work up was suggested due to concern for possible esophageal dysphagia based upon patient's presentation.  Today he was confused, essentially non verbal and  unable to follow commands during this visit.  His current mentation is his biggest risk factor for aspiration.  Due to his mentation cranial nerve exam was unable to be completed but obvious facial, labial and lingual weakness/droop was not noted.  There are concerns for oropharyngea dysphagia.  Mastication of soft solids was slow.  Oral holding was observed for all textures along with decreased bolus  awareness.  At times, he was observed to hold material in his mouth requiring cues to swallow.  Swallow trigger was appreciated to palplation and overt s/s of aspiration were not seen.  Suggest a dysphagia 2 diet with thin liquids.  ST will follow for possible diet advancement as mentation hopefully improves. SLP Visit Diagnosis: Dysphagia, unspecified (R13.10)    Aspiration Risk  Mild aspiration risk    Diet Recommendation   Dysphagia 2 with thin liquids  Medication Administration: Crushed with puree    Other  Recommendations Oral Care Recommendations: Oral care BID    Recommendations for follow up therapy are one component of a multi-disciplinary discharge planning process, led by the attending physician.  Recommendations may be updated based on patient status, additional functional criteria and insurance authorization.  Follow up Recommendations Other (comment) (TBD)      Assistance Recommended at Discharge Frequent or constant Supervision/Assistance  Functional Status Assessment Patient has had a recent decline in their functional status and demonstrates the ability to make significant improvements in function in a reasonable and predictable amount of time.  Frequency and Duration min 1 x/week  2 weeks       Prognosis Prognosis for Safe Diet Advancement: Good Barriers to Reach Goals: Cognitive deficits      Swallow Study   General Date of Onset: 05/22/22 HPI: 88 year old Caucasian male with history of chronic atrial fibrillation not currently on anticoagulation due to history of recurrent nosebleeds, history of antiphospholipid positive antibody, chronic diastolic heart failure, recent right pleural effusion status postthoracentesis, CKD stage IV with baseline creatinine 2.1-2.4, left heel decubitus ulcer, sacral decubitus ulcer, acquired hypothyroidism who presents to the ER today from [place] home.  Daughter states patient's had poor p.o. intake for the last few days.   He has become increasingly confused.  Patient was discharged to [facility] on 05/15/2022 after a 7-day hospital stay due to acute CHF.  He was diagnosed with recurrent right pleura effusion and delerium.  Most recent chest xray was showing moder right and small left pleural effusion similiar to 09/03 radiographs and bibasilar heterogenous airspace opacities; can not exclude pneumonia. Type of Study: Bedside Swallow Evaluation Previous Swallow Assessment: CSE on 05/11/2022 with recommendation for regular/thin liquids. Diet Prior to this Study: Regular;Thin liquids Temperature Spikes Noted: No Respiratory Status: Nasal cannula History of Recent Intubation: No Behavior/Cognition: Alert;Doesn't follow directions;Confused Oral Cavity Assessment: Within Functional Limits Oral Care Completed by SLP: No Self-Feeding Abilities: Total assist Patient Positioning: Upright in bed Baseline Vocal Quality: Not observed Volitional Swallow: Unable to elicit    Oral/Motor/Sensory Function Overall Oral Motor/Sensory Function: Other (comment) (Unable to assess due to current mentation)   Ice Chips Ice chips: Not tested   Thin Liquid Thin Liquid: Within functional limits Presentation: Spoon;Straw    Nectar Thick Nectar Thick Liquid: Not tested   Honey Thick Honey Thick Liquid: Not tested   Puree Puree: Impaired Presentation: Spoon Oral Phase Impairments: Poor awareness of bolus;Impaired mastication Oral Phase Functional Implications: Prolonged oral transit;Oral holding   Solid     Solid: Impaired Presentation: Spoon Oral Phase Impairments: Impaired  mastication;Poor awareness of bolus Oral Phase Functional Implications: Oral holding;Impaired mastication;Prolonged oral transit      Shelly Flatten, MA, CCC-SLP Acute Rehab SLP [phone]  Lamar Sprinkles 05/23/2022,9:53 AM

## 2022-05-23 NOTE — Assessment & Plan Note (Addendum)
Was discharged from hospital on 05-15-2022. Unable to examine this due to pt's body position. Will consult wound care to thoroughly examine and measure this. Presents on admission.

## 2022-05-23 NOTE — Assessment & Plan Note (Signed)
Stable. Baseline Scr 2.1-2.4

## 2022-05-23 NOTE — Assessment & Plan Note (Signed)
Unclear the cause of his delirium.

## 2022-05-23 NOTE — Assessment & Plan Note (Signed)
Continue with coreg 3.125 mg bid.

## 2022-05-23 NOTE — Assessment & Plan Note (Signed)
Admit to obs med/surg bed. Will order thoracentesis right side. Etiology likely due to chronic diastolic CHF and poor protein stores/low serum albumin. Discussed with pt's dtr that effusion will likely return unless pt's appetite and whole body protein stores can be replenished. She would like to talk to palliative care as well.  She is considering moving pt into ALF at [facility]. Pt was living in the independent living section of [facility] prior to last hospital admission. She understands that pt will likely not walk independently again. She accepts the fact that pt will likely be wheelchair bound and only able to stand and transfer.  Pt is already DNR/DNI. She wants to talk to palliative care and wants more information about hospice care.

## 2022-05-23 NOTE — Assessment & Plan Note (Signed)
Verified DNR form with pt's dtr mary.

## 2022-05-23 NOTE — Assessment & Plan Note (Signed)
Stable. Not on anticoagulation per outpatient cardiology due to recurrent nosebleeds while on Eliquis and coumadin.

## 2022-05-23 NOTE — Assessment & Plan Note (Signed)
May be volume overloaded now. Pt given 1.5 liters of IV NS in ER. Will give 1 dose of IV lasix.

## 2022-05-23 NOTE — H&P (Signed)
History and Physical    Steven Williams RKY:[id number] DOB: 1930-06-07 DOA: 05/22/2022  DOS: the patient was seen and examined on 05/22/2022  PCP: Ginger Organ., MD   Patient coming from: [facility]  I have personally briefly reviewed patient's old medical records in [place]  CC: decreased appetite HPI: 88 year old Caucasian male history of chronic atrial fibrillation not currently on anticoagulation due to history of recurrent nosebleeds, history of antiphospholipid positive antibody, chronic diastolic heart failure, recent right pleural effusion status postthoracentesis, CKD stage IV baseline creatinine 2.1-2.4, left heel decubitus ulcer, sacral decubitus ulcer, acquired hypothyroidism presents to the ER today from [facility].  Daughter states patient's had poor p.o. intake for the last few days.  He has become increasingly confused.  Patient was discharged to [facility] on 05/15/2022 after a 7-day hospital stay due to acute CHF.  Reportedly patient was hypoxic with room air saturations of 87% patient placed on 3 L by EMS.  When he arrived to the ER here, patient was satting 91% on room air.  Labs showed a digoxin level is normal at 0.8.  Clean-catch UA showed rare bacteria negative nitrites.  Sodium 145, potassium 3.4, BUN of 38, creatinine 1.9  White count 5.5, hemoglobin 8.8, platelets of 168  Chest x-ray shows right-sided pleural effusion that is reaccumulated.  EKG shows atrial fibrillation.  This was personally reviewed and interpreted by myself.  [facility] hospital[facility] contacted for admission.   ED Course: given 1.5 liters IVF. CXR shows right pleural effusion.   Review of Systems:  Review of Systems  Unable to perform ROS: Mental acuity    Past Medical History:  Diagnosis Date   Acute renal failure superimposed on stage 4 chronic kidney disease ([place]) 04/18/2022   Antiphospholipid antibody positive    Aortic aneurysm (HCC)    thoracic, Dr  Roxan Hockey, with h/o dissection   BPH (benign prostatic hyperplasia)    Central retinal artery occlusion    Chronic anemia    Chronic renal insufficiency    COPD (chronic obstructive pulmonary disease) (HCC)    DDD (degenerative disc disease), cervical    DJD (degenerative joint disease)    DVT (deep venous thrombosis) (HCC)    Gastric dysmotility    GERD (gastroesophageal reflux disease)    Gout    HTN (hypertension)    Hyperlipidemia    Hypertensive retinopathy    OU   Hypothyroid    Macular degeneration    Myelodysplasia (myelodysplastic syndrome) (HCC)    Paget's disease of bone    Peripheral neuropathy    Permanent atrial fibrillation (HCC)    Recurrent epistaxis 06/26/2020   Rheumatoid arthritis (HCC)    TIA (transient ischemic attack)     Past Surgical History:  Procedure Laterality Date   ABDOMINAL AORTOGRAM W/LOWER EXTREMITY Left 04/24/2022   Procedure: ABDOMINAL AORTOGRAM W/LOWER EXTREMITY;  Surgeon: Cherre Robins, MD;  Location: [facility];  Service: Cardiovascular;  Laterality: Left;   ATRIAL FIBRILLATION ABLATION  09/23/2014   Dr Encarnacion Chu in [place]     lumbar, x 2   CAROTID ENDARTERECTOMY Right 1990   CAROTID STENT     CATARACT EXTRACTION Bilateral 2010   [place]   EYE SURGERY Bilateral 2010   Cat Sx   LUNG DECORTICATION     VATS   PACEMAKER GENERATOR CHANGE  12/03/2011   MDT Adapta [id number] generator change by Dr Encarnacion Chu for symptomatic bradycardia   PACEMAKER IMPLANT  2004  MDT      reports that he quit smoking about 59 years ago. His smoking use included cigarettes. He has a 24.00 pack-year smoking history. He has never used smokeless tobacco. He reports current alcohol use of about 2.0 standard drinks of alcohol per week. He reports that he does not use drugs.  Allergies  Allergen Reactions   Iodinated Contrast Media Rash    Family History  Problem Relation Age of Onset   Cirrhosis Mother    Stroke Father    Lung  cancer Sister    Lung cancer Brother     Prior to Admission medications   Medication Sig Start Date End Date Taking? Authorizing Provider  acetaminophen (TYLENOL) 325 MG tablet Take 2 tablets (650 mg total) by mouth every 6 (six) hours as needed for moderate pain or mild pain (or Fever >/= 101). 05/15/22  Yes Arrien, Jimmy Picket, MD  allopurinol (ZYLOPRIM) 100 MG tablet Take 0.5 tablets (50 mg total) by mouth every other day. 04/27/22  Yes Dwyane Dee, MD  Amino Acids-Protein Hydrolys (PRO-STAT PO) Take 30 mLs by mouth daily. 05/19/22  Yes [provider]  aspirin EC 81 MG tablet Take 1 tablet (81 mg total) by mouth daily. Swallow whole. 04/28/22  Yes Dwyane Dee, MD  atorvastatin (LIPITOR) 40 MG tablet Take 1 tablet (40 mg total) by mouth daily. 04/28/22  Yes Dwyane Dee, MD  carvedilol (COREG) 3.125 MG tablet Take 1 tablet (3.125 mg total) by mouth 2 (two) times daily with a meal. 05/15/22 06/14/22 Yes Arrien, Jimmy Picket, MD  cholecalciferol (VITAMIN D3) 25 MCG (1000 UNIT) tablet Take 2,000 Units by mouth daily.   Yes [provider]  digoxin (LANOXIN) 0.125 MG tablet Take 0.125 mg by mouth every Monday, Wednesday, and Friday.   Yes [provider]  Ensure (ENSURE) Take 237 mLs by mouth daily.   Yes [provider]  Ferrous Sulfate (IRON) 325 (65 Fe) MG TABS Take 1 tablet (325 mg total) by mouth daily with breakfast. 04/27/22  Yes Dwyane Dee, MD  furosemide (LASIX) 40 MG tablet Take 1 tablet (40 mg total) by mouth daily. 05/16/22  Yes Arrien, Jimmy Picket, MD  hydrocortisone (ANUSOL-HC) 2.5 % rectal cream Apply 1 application topically 2 (two) times daily as needed for hemorrhoids or anal itching. 04/15/21  Yes [provider]  levothyroxine (SYNTHROID) 100 MCG tablet Take 100 mcg by mouth daily. 06/04/20  Yes [provider]  Multiple Vitamins-Minerals (PRESERVISION AREDS PO) Take 1 capsule by mouth 2 (two) times daily.   Yes  [provider]  pantoprazole (PROTONIX) 40 MG tablet Take 40 mg by mouth daily. 06/04/20  Yes [provider]  Polyvinyl Alcohol-Povidone (REFRESH OP) Place 1 drop into both eyes 4 (four) times daily. Wait 3-5 min between different eye drops. 05/15/22  Yes [provider]  rOPINIRole (REQUIP) 1 MG tablet Take 3 tablets (3 mg total) by mouth at bedtime. 04/27/22  Yes Dwyane Dee, MD  saw palmetto 500 MG capsule Take 500 mg by mouth daily.   Yes [provider]  senna-docusate (SENEXON-S) 8.6-50 MG tablet Take 1 tablet by mouth 2 (two) times daily.   Yes [provider]  sodium chloride (OCEAN) 0.65 % SOLN nasal spray Place 2 sprays into both nostrils every 4 (four) hours while awake. 01/11/22 05/22/22 Yes Pahwani, Einar Grad, MD  Sodium Phosphates (RA SALINE ENEMA RE) Place 1 Dose rectally as needed (constipation). If not relieved by Biscodyl suppository, give disposable Saline enema  rectally x 1 dose/24 hours as needed. 05/17/22  Yes [provider]  tamsulosin (FLOMAX) 0.4 MG CAPS capsule Take 0.4 mg by mouth daily.   Yes [provider]  traMADol (ULTRAM) 50 MG tablet Take 0.5 tablets (25 mg total) by mouth every 12 (twelve) hours as needed for severe pain. 05/15/22  Yes Arrien, Jimmy Picket, MD  vitamin B-12 (CYANOCOBALAMIN) 1000 MCG tablet Take 1 tablet (1,000 mcg total) by mouth daily. 07/09/21  Yes Orson Slick, MD    Physical Exam: Vitals:   05/22/22 2030 05/22/22 2130 05/22/22 2145 05/22/22 2230  BP: 120/71 108/73  114/67  Pulse: (!) 52 (!) 106 (!) 106 88  Resp: 16 (!) 29 (!) 22 20  Temp: 97.6 F (36.4 C)     TempSrc: Oral     SpO2: 100% 95% 91% 94%  Weight:      Height:        Physical Exam Vitals and nursing note reviewed.  Constitutional:      General: He is not in acute distress.    Appearance: He is not toxic-appearing.     Comments: Chronically ill appearing male  HENT:     Head: Normocephalic and atraumatic.   Cardiovascular:     Rate and Rhythm: Normal rate. Rhythm irregular.     Heart sounds: Murmur heard.  Pulmonary:     Effort: Tachypnea present.     Breath sounds: Examination of the right-middle field reveals decreased breath sounds. Examination of the right-lower field reveals decreased breath sounds. Decreased breath sounds present.     Comments: Bedside thoracic U/S shows moderate right sided pleural effusion. Abdominal:     General: Bowel sounds are normal. There is no distension.     Tenderness: There is no abdominal tenderness. There is no guarding or rebound.  Skin:    General: Skin is warm and dry.     Findings: Lesion present.     Comments: Left heel decubitus ulcer with black eschar. Unstageable. See picture.  Neurological:     Mental Status: He is disoriented.       Labs on Admission: I have personally reviewed following labs and imaging studies  CBC: Recent Labs  Lab 05/22/22 1251  WBC 5.5  NEUTROABS 3.4  HGB 8.8*  HCT 28.0*  MCV 104.5*  PLT 016   Basic Metabolic Panel: Recent Labs  Lab 05/22/22 1251  NA 145  K 3.4*  CL 107  CO2 27  GLUCOSE 104*  BUN 38*  CREATININE 1.95*  CALCIUM 11.6*   GFR: Estimated Creatinine Clearance: 34.4 mL/min (A) (by C-G formula based on SCr of 1.95 mg/dL (H)). Liver Function Tests: No results for input(s): "AST", "ALT", "ALKPHOS", "BILITOT", "PROT", "ALBUMIN" in the last 168 hours. No results for input(s): "LIPASE", "AMYLASE" in the last 168 hours. No results for input(s): "AMMONIA" in the last 168 hours. Coagulation Profile: No results for input(s): "INR", "PROTIME" in the last 168 hours. Cardiac Enzymes: Recent Labs  Lab 05/22/22 1815 05/22/22 2146  TROPONINIHS 25* 34*   BNP (last 3 results) No results for input(s): "PROBNP" in the last 8760 hours. HbA1C: No results for input(s): "HGBA1C" in the last 72 hours. CBG: Recent Labs  Lab 05/22/22 1221  GLUCAP 99   Lipid Profile: No results for input(s):  "CHOL", "HDL", "LDLCALC", "TRIG", "CHOLHDL", "LDLDIRECT" in the last 72 hours. Thyroid Function Tests: No results for input(s): "TSH", "T4TOTAL", "FREET4", "T3FREE", "THYROIDAB" in the last 72 hours. Anemia Panel: No results for input(s): "  VITAMINB12", "FOLATE", "FERRITIN", "TIBC", "IRON", "RETICCTPCT" in the last 72 hours. Urine analysis:    Component Value Date/Time   COLORURINE YELLOW 05/22/2022 1256   APPEARANCEUR HAZY (A) 05/22/2022 1256   LABSPEC 1.010 05/22/2022 1256   PHURINE 5.0 05/22/2022 1256   GLUCOSEU NEGATIVE 05/22/2022 1256   HGBUR NEGATIVE 05/22/2022 [place] 05/22/2022 [facility] 05/22/2022 1256   PROTEINUR NEGATIVE 05/22/2022 1256   NITRITE NEGATIVE 05/22/2022 1256   LEUKOCYTESUR LARGE (A) 05/22/2022 1256    Radiological Exams on Admission: I have personally reviewed images DG Chest 1 View  Result Date: 05/22/2022 CLINICAL DATA:  Shortness of breath. EXAM: CHEST  1 VIEW COMPARISON:  AP chest 05/10/2022, chest two views 05/10/2022, CT chest 05/09/2022 FINDINGS: Left chest wall cardiac pacer with leads overlying the right atrium and right ventricle. Cardiac silhouette is again moderately to markedly enlarged. Mediastinal contours are within normal limits. Moderate calcification within the aortic arch. Moderate right and small left pleural effusions appear similar to the two views of the chest 05/10/2022 study at 6:28 a.m. There was less opacification from pleural fluid on the 05/10/2022 AP view of the chest at 9:58 a.m. following interval thoracentesis. Moderate bibasilar heterogeneous airspace opacities. No pneumothorax is seen. No acute skeletal abnormality. Moderate multilevel degenerative bridging osteophytes of the thoracic spine. IMPRESSION: 1. Moderate right and small left pleural effusions. This appears very similar to the 05/10/2022 radiographs prior to thoracentesis later that day. 2. Bibasilar heterogeneous airspace opacities,  likely atelectasis. Cannot exclude underlying pneumonia. Electronically Signed   By: Yvonne Kendall M.D.   On: 05/22/2022 13:32    EKG: My personal interpretation of EKG shows: afib    Assessment/Plan Principal Problem:   Recurrent right pleural effusion Active Problems:   Delirium   Recurrent epistaxis   CKD (chronic kidney disease) stage 4, GFR 15-29 ml/min (HCC)   Essential hypertension   Hypothyroidism   Atrial fibrillation, chronic (HCC) - not on anticoagulation due to recurrent nosebleeds   DNR (do not resuscitate)   Chronic diastolic CHF (congestive heart failure) ([place])   Sacral ulcer ([place])   Decubitus ulcer of left heel, unstageable (HCC)   Antiphospholipid antibody positive    Assessment and Plan: * Recurrent right pleural effusion Admit to obs med/surg bed. Will order thoracentesis right side. Etiology likely due to chronic diastolic CHF and poor protein stores/low serum albumin. Discussed with pt's dtr that effusion will likely return unless pt's appetite and whole body protein stores can be replenished. She would like to talk to palliative care as well.  She is considering moving pt into ALF at [facility]. Pt was living in the independent living section of [facility] prior to last hospital admission. She understands that pt will likely not walk independently again. She accepts the fact that pt will likely be wheelchair bound and only able to stand and transfer.  Pt is already DNR/DNI. She wants to talk to palliative care and wants more information about hospice care.  Delirium Unclear the cause of his delirium.   Antiphospholipid antibody positive Stable. Not on anticoagulation per outpatient cardiology due to recurrent nosebleeds while on Eliquis and coumadin.  Decubitus ulcer of left heel, unstageable ([place]) Present on admission. Will need wound care consult for evaluation and treatment.      Sacral ulcer ([place]) Was discharged from hospital on 05-15-2022. Unable to  examine this due to pt's body position. Will consult wound care to thoroughly examine and measure this. Presents on admission.  Chronic diastolic CHF (congestive heart failure) ([place]) May be volume overloaded now. Pt given 1.5 liters of IV NS in ER. Will give 1 dose of IV lasix.  DNR (do not resuscitate) Verified DNR form with pt's dtr mary.     Atrial fibrillation, chronic (HCC) - not on anticoagulation due to recurrent nosebleeds Chronic. Not on anticoagulation due to recurrent nose bleeds. Hold digoxin. Continue coreg.  Hypothyroidism Stable. Continue synthroid 100 mcg daily.  Essential hypertension Continue with coreg 3.125 mg bid.  CKD (chronic kidney disease) stage 4, GFR 15-29 ml/min (HCC) Stable. Baseline Scr 2.1-2.4  Recurrent epistaxis Stable. This is the reason pt is not on systemic anticoagulation.   DVT prophylaxis: SCDs Code Status: DNR/DNI(Do NOT Intubate) verified DNR with pt's dtr mary Family Communication: discussed at bedside with pt's dtr mary  Disposition Plan: return to [facility]  Consults called: none  Admission status: Observation, Med-Surg   Kristopher Oppenheim, DO [facility] 05/23/2022, 12:26 AM

## 2022-05-23 NOTE — Assessment & Plan Note (Signed)
Present on admission. Will need wound care consult for evaluation and treatment.

## 2022-05-23 NOTE — Assessment & Plan Note (Signed)
Stable. Continue synthroid 100 mcg daily.

## 2022-05-23 NOTE — Subjective & Objective (Signed)
CC: decreased appetite HPI: 88 year old Caucasian male history of chronic atrial fibrillation not currently on anticoagulation due to history of recurrent nosebleeds, history of antiphospholipid positive antibody, chronic diastolic heart failure, recent right pleural effusion status postthoracentesis, CKD stage IV baseline creatinine 2.1-2.4, left heel decubitus ulcer, sacral decubitus ulcer, acquired hypothyroidism presents to the ER today from [place] home.  Daughter states patient's had poor p.o. intake for the last few days.  He has become increasingly confused.  Patient was discharged to [facility] on 05/15/2022 after a 7-day hospital stay due to acute CHF.  Reportedly patient was hypoxic with room air saturations of 87% patient placed on 3 L by EMS.  When he arrived to the ER here, patient was satting 91% on room air.  Labs showed a digoxin level is normal at 0.8.  Clean-catch UA showed rare bacteria negative nitrites.  Sodium 145, potassium 3.4, BUN of 38, creatinine 1.9  White count 5.5, hemoglobin 8.8, platelets of 168  Chest x-ray shows right-sided pleural effusion that is reaccumulated.  EKG shows atrial fibrillation.  This was personally reviewed and interpreted by myself.  [facility] hospitalist contacted for admission.

## 2022-05-24 DIAGNOSIS — R760 Raised antibody titer: Secondary | ICD-10-CM | POA: Diagnosis not present

## 2022-05-24 DIAGNOSIS — Z7189 Other specified counseling: Secondary | ICD-10-CM | POA: Diagnosis not present

## 2022-05-24 DIAGNOSIS — I5032 Chronic diastolic (congestive) heart failure: Secondary | ICD-10-CM | POA: Diagnosis not present

## 2022-05-24 DIAGNOSIS — N184 Chronic kidney disease, stage 4 (severe): Secondary | ICD-10-CM | POA: Diagnosis not present

## 2022-05-24 DIAGNOSIS — J90 Pleural effusion, not elsewhere classified: Secondary | ICD-10-CM | POA: Diagnosis not present

## 2022-05-24 DIAGNOSIS — Z515 Encounter for palliative care: Secondary | ICD-10-CM | POA: Diagnosis not present

## 2022-05-24 LAB — BASIC METABOLIC PANEL
Anion gap: 13 (ref 5–15)
BUN: 33 mg/dL — ABNORMAL HIGH (ref 8–23)
CO2: 28 mmol/L (ref 22–32)
Calcium: 10.7 mg/dL — ABNORMAL HIGH (ref 8.9–10.3)
Chloride: 107 mmol/L (ref 98–111)
Creatinine, Ser: 1.93 mg/dL — ABNORMAL HIGH (ref 0.61–1.24)
GFR, Estimated: 32 mL/min — ABNORMAL LOW (ref >60)
Glucose, Bld: 87 mg/dL (ref 70–99)
Potassium: 3.3 mmol/L — ABNORMAL LOW (ref 3.5–5.1)
Sodium: 148 mmol/L — ABNORMAL HIGH (ref 135–145)

## 2022-05-24 MED ORDER — POLYVINYL ALCOHOL 1.4 % OP SOLN
1.0000 [drp] | Freq: Four times a day (QID) | OPHTHALMIC | Status: DC | PRN
  Filled 2022-05-24: qty 15

## 2022-05-24 MED ORDER — BIOTENE DRY MOUTH MT LIQD: 15.0000 mL | OROMUCOSAL | Status: DC | PRN

## 2022-05-24 MED ORDER — LORAZEPAM 2 MG/ML IJ SOLN: 1.0000 mg | INTRAMUSCULAR | 0 refills | Status: AC | PRN

## 2022-05-24 MED ORDER — MORPHINE SULFATE (PF) 2 MG/ML IV SOLN
2.0000 mg | INTRAVENOUS | Status: DC | PRN
  Administered 2022-05-24 (×2): 2 mg via INTRAVENOUS
  Filled 2022-05-24 (×2): qty 1

## 2022-05-24 MED ORDER — GLYCOPYRROLATE 1 MG PO TABS: 1.0000 mg | ORAL_TABLET | ORAL | Status: DC | PRN

## 2022-05-24 MED ORDER — GLYCOPYRROLATE 0.2 MG/ML IJ SOLN: 0.2000 mg | INTRAMUSCULAR | Status: AC | PRN

## 2022-05-24 MED ORDER — MORPHINE SULFATE (CONCENTRATE) 10 MG/0.5ML PO SOLN: 5.0000 mg | ORAL | 0 refills | Status: AC | PRN

## 2022-05-24 MED ORDER — GLYCOPYRROLATE 0.2 MG/ML IJ SOLN: 0.2000 mg | INTRAMUSCULAR | Status: DC | PRN

## 2022-05-24 MED ORDER — LORAZEPAM 2 MG/ML IJ SOLN: 1.0000 mg | INTRAMUSCULAR | Status: DC | PRN

## 2022-05-24 MED ORDER — MORPHINE SULFATE (PF) 2 MG/ML IV SOLN: 2.0000 mg | INTRAVENOUS | 0 refills | Status: AC | PRN

## 2022-05-24 NOTE — Progress Notes (Signed)
PT Cancellation Note  Patient Details Name: Steven Williams MRN: [id number] DOB: 1930-04-09   Cancelled Treatment:    Reason Eval/Treat Not Completed: Other (comment). Plan for [place] meeting today with family. Will plan to wait for [place] to be established with meeting today, and if PT is still desired will plan to follow-up as time permits.   Moishe Spice, PT, DPT Acute Rehabilitation Services  Office: [place] 05/24/2022, 8:40 AM

## 2022-05-24 NOTE — Progress Notes (Signed)
Patient D/C to [facility], transported by [place], son at bedside.

## 2022-05-24 NOTE — Progress Notes (Addendum)
    Patient has been transitioned to comfort measures only today. Cardiology will sign off. Please call with questions.

## 2022-05-24 NOTE — Discharge Summary (Signed)
PATIENT DETAILS Name: Steven Williams Age: 88 y.o. Sex: male Date of Birth: Feb 13, 1930 MRN: [id number]. Admitting Physician: Steven Osgood, MD FIE:PPIR, Steven Williams., MD  Admit Date: 05/22/2022 Discharge date: 05/24/2022  Recommendations for Outpatient Follow-up:  Optimize comfort measures.  Admitted From:  SNF  Disposition: Hospice care   Discharge Condition: poor  CODE STATUS:   Code Status: DNR   Diet recommendation:  Diet Order             Diet - low sodium heart healthy           DIET DYS 2 Room service appropriate? Yes; Fluid consistency: Thin  Diet effective now                    Brief Summary: Patient is a 88 y.o.  male with history of HFpEF, chronic right pleural effusion (transudative), CKD stage IV, recurrent epistaxis-who was recently hospitalized for HFpEF exacerbation-subsequently sent to SNF-presented back to the ED with confusion, poor oral intake and severe failure to thrive syndrome.   Significant events: 9/2-9/8>> hospitalization for HFpEF exacerbation/underwent thoracocentesis-discharged to SNF. 9/15>> presented to ED with confusion-poor oral intake-admit to [facility] 9/17>> transitioned to comfort measures.   Significant studies: 9/15>> CXR: Moderate right pleural effusion-bibasilar atelectasis.   Significant microbiology data: 9/15>> urine culture: > 100,000 colonies of gram-negative rods (likely contamination-as UA and unremarkable-and really no features of UTI)   Procedures: None   Consults: Palliative care  Brief Hospital Course: Acute metabolic encephalopathy with severe failure to thrive syndrome/poor oral intake: Continues to decline-no oral intake for the past several days-after extensive discussion with family by this MD and palliative care team-transitioned to full comfort measures.  Residential hospice planned   Chronic right pleural effusion: Transudative-unlikely related to HFpEF.  Asymptomatic-lying flat-no benefit from  thoracocentesis.  Risks outweigh benefits.  Family considering residential hospice-see above.  Asymptomatic bacteriuria: No indication to treat   Chronic HFpEF: Volume status is stable-suspect he may actually be on the dry side.  No plan to restart diuretics.   HTN: Very poor oral intake-transition to comfort measures-stopping all antihypertensives.   HLD: Poor oral intake-stop statin.  Comfort measures.     CKD stage IV: Creatinine close to baseline.   Normocytic anemia: Due to CKD-no indication to transfuse.    Hypernatremia: Due to poor oral intake.  No longer on diuretics.   Recurrent epistaxis: No evidence of recurrence-off anticoagulation.   History of antiphospholipid syndrome: Not on anticoagulation due to recurrent epistaxis.   Hypothyroidism: Hardly any oral intake-sleeping-no role to continue Synthroid-family leaning towards comfort measures.   RLS: Poor oral intake-stopping Requip.   BPH: Poor oral take-stop Flomax-watch for urinary retention-Place Foley if that occurs.  Unstageable sacral decubitus ulcer/right heel ulcer: All present prior to hospitalization-await wound care evaluation.   Palliative care: DNR in place.  Poor overall prognosis-discussed with daughter-Steven Williams over the phone on 9/16 long discussion-and subsequently with son at bedside on 9/17.  He is at end-of-life-and very appropriate for residential hospice.  He has not had any significant oral intake for the past several days.     Pressure Ulcer: Pressure Injury 05/09/22 Heel Right Deep Tissue Pressure Injury - Purple or maroon localized area of discolored intact skin or blood-filled blister due to damage of underlying soft tissue from pressure and/or shear. (Active)  05/09/22 2100  Location: Heel  Location Orientation: Right  Staging: Deep Tissue Pressure Injury - Purple or maroon localized area of discolored intact skin or  blood-filled blister due to damage of underlying soft tissue from pressure  and/or shear.  Wound Description (Comments):   Present on Admission: Yes  Dressing Type Gauze (Comment) 05/24/22 0029    Discharge Diagnoses:  Principal Problem:   Recurrent right pleural effusion Active Problems:   Delirium   Recurrent epistaxis   CKD (chronic kidney disease) stage 4, GFR 15-29 ml/min (HCC)   Essential hypertension   Hypothyroidism   Atrial fibrillation, chronic (HCC) - not on anticoagulation due to recurrent nosebleeds   DNR (do not resuscitate)   Chronic diastolic CHF (congestive heart failure) ([place])   Sacral ulcer ([place])   Decubitus ulcer of left heel, unstageable (HCC)   Antiphospholipid antibody positive   Failure to thrive in adult   Discharge Instructions:  Activity:  As tolerated   Discharge Instructions     Diet - low sodium heart healthy   Complete by: As directed    Increase activity slowly   Complete by: As directed    No dressing needed   Complete by: As directed       Allergies as of 05/24/2022       Reactions   Iodinated Contrast Media Rash        Medication List     STOP taking these medications    acetaminophen 325 MG tablet Commonly known as: TYLENOL   allopurinol 100 MG tablet Commonly known as: ZYLOPRIM   aspirin EC 81 MG tablet   atorvastatin 40 MG tablet Commonly known as: LIPITOR   carvedilol 3.125 MG tablet Commonly known as: COREG   cholecalciferol 25 MCG (1000 UNIT) tablet Commonly known as: VITAMIN D3   cyanocobalamin 1000 MCG tablet Commonly known as: VITAMIN B12   digoxin 0.125 MG tablet Commonly known as: LANOXIN   Ensure   furosemide 40 MG tablet Commonly known as: LASIX   hydrocortisone 2.5 % rectal cream Commonly known as: ANUSOL-HC   Iron 325 (65 Fe) MG Tabs   levothyroxine 100 MCG tablet Commonly known as: SYNTHROID   pantoprazole 40 MG tablet Commonly known as: PROTONIX   PRESERVISION AREDS PO   PRO-STAT PO   REFRESH OP   rOPINIRole 1 MG tablet Commonly known as:  REQUIP   saw palmetto 500 MG capsule   Senexon-S 8.6-50 MG tablet Generic drug: senna-docusate   sodium chloride 0.65 % Soln nasal spray Commonly known as: OCEAN   tamsulosin 0.4 MG Caps capsule Commonly known as: FLOMAX   traMADol 50 MG tablet Commonly known as: ULTRAM       TAKE these medications    glycopyrrolate 0.2 MG/ML injection Commonly known as: ROBINUL Inject 1 mL (0.2 mg total) into the skin every 4 (four) hours as needed (excessive secretions).   LORazepam 2 MG/ML injection Commonly known as: ATIVAN Inject 0.5 mLs (1 mg total) into the vein every hour as needed for anxiety, sedation or seizure.   morphine CONCENTRATE 10 MG/0.5ML Soln concentrated solution Take 0.25 mLs (5 mg total) by mouth every 4 (four) hours as needed for moderate pain or shortness of breath.   morphine (PF) 2 MG/ML injection Inject 1 mL (2 mg total) into the vein every hour as needed (Dyspnea/Pain).               Discharge Care Instructions  (From admission, onward)           Start     Ordered   05/24/22 0000  No dressing needed        05/24/22 1047  Follow-up Information     Ginger Organ., MD. Schedule an appointment as soon as possible for a visit.   Specialty: Internal Medicine Why: As needed Contact information: [place] [address] [phone]         Thompson Grayer, MD Follow up.   Specialty: Cardiology Why: As needed Contact information: [place] [address] [phone]                Allergies  Allergen Reactions   Iodinated Contrast Media Rash     Other Procedures/Studies: DG Chest 1 View  Result Date: 05/22/2022 CLINICAL DATA:  Shortness of breath. EXAM: CHEST  1 VIEW COMPARISON:  AP chest 05/10/2022, chest two views 05/10/2022, CT chest 05/09/2022 FINDINGS: Left chest wall cardiac pacer with leads overlying the right atrium and right ventricle. Cardiac silhouette is  again moderately to markedly enlarged. Mediastinal contours are within normal limits. Moderate calcification within the aortic arch. Moderate right and small left pleural effusions appear similar to the two views of the chest 05/10/2022 study at 6:28 a.m. There was less opacification from pleural fluid on the 05/10/2022 AP view of the chest at 9:58 a.m. following interval thoracentesis. Moderate bibasilar heterogeneous airspace opacities. No pneumothorax is seen. No acute skeletal abnormality. Moderate multilevel degenerative bridging osteophytes of the thoracic spine. IMPRESSION: 1. Moderate right and small left pleural effusions. This appears very similar to the 05/10/2022 radiographs prior to thoracentesis later that day. 2. Bibasilar heterogeneous airspace opacities, likely atelectasis. Cannot exclude underlying pneumonia. Electronically Signed   By: Yvonne Kendall M.D.   On: 05/22/2022 13:32   ECHOCARDIOGRAM COMPLETE  Result Date: 05/10/2022    ECHOCARDIOGRAM REPORT   Patient Name:   JALIN ALICEA Date of Exam: 05/10/2022 Medical Rec #:  [id number]     Height:       74.0 in Accession #:    [id number]    Weight:       189.4 lb Date of Birth:  10-29-1929     BSA:          2.124 m Patient Age:    75 years      BP:           97/46 mmHg Patient Gender: M             HR:           68 bpm. Exam Location:  Inpatient Procedure: 2D Echo, Cardiac Doppler and Color Doppler Indications:    CHF-acute diastolic  History:        Patient has prior history of Echocardiogram examinations, most                 recent 09/12/2018. CHF, Pacemaker, Arrythmias:Atrial Fibrillation;                 Risk Factors:Hypertension and Dyslipidemia. MAC. CKD. MVP.  Sonographer:    Clayton Lefort RDCS (AE) Referring Phys: [id number] [place]  1. Left ventricular ejection fraction, by estimation, is 60 to 65%. The left ventricle has normal function. The left ventricle has no regional wall motion abnormalities. There is moderate left  ventricular hypertrophy. Left ventricular diastolic parameters are indeterminate.  2. Right ventricular systolic function is normal. The right ventricular size is normal. There is mildly elevated pulmonary artery systolic pressure.  3. Left atrial size was moderately dilated.  4. Right atrial size was moderately dilated.  5. The mitral valve is myxomatous. Moderate to severe mitral valve  regurgitation. No evidence of mitral stenosis. There is mild holosystolic prolapse of both leaflets of the mitral valve.  6. Tricuspid valve regurgitation is severe.  7. The aortic valve is normal in structure. There is moderate calcification of the aortic valve. There is moderate thickening of the aortic valve. Aortic valve regurgitation is not visualized. Aortic valve sclerosis/calcification is present, without any  evidence of aortic stenosis. Aortic valve mean gradient measures 10.5 mmHg. Aortic valve Vmax measures 2.27 m/s.  8. The inferior vena cava is dilated in size with <50% respiratory variability, suggesting right atrial pressure of 15 mmHg. FINDINGS  Left Ventricle: Left ventricular ejection fraction, by estimation, is 60 to 65%. The left ventricle has normal function. The left ventricle has no regional wall motion abnormalities. The left ventricular internal cavity size was normal in size. There is  moderate left ventricular hypertrophy. Left ventricular diastolic parameters are indeterminate. Right Ventricle: The right ventricular size is normal. No increase in right ventricular wall thickness. Right ventricular systolic function is normal. There is mildly elevated pulmonary artery systolic pressure. The tricuspid regurgitant velocity is 2.44  m/s, and with an assumed right atrial pressure of 15 mmHg, the estimated right ventricular systolic pressure is 23.5 mmHg. Left Atrium: Left atrial size was moderately dilated. Right Atrium: Right atrial size was moderately dilated. Pericardium: There is no evidence of  pericardial effusion. Mitral Valve: The mitral valve is myxomatous. There is mild holosystolic prolapse of both leaflets of the mitral valve. Mild mitral annular calcification. Moderate to severe mitral valve regurgitation. No evidence of mitral valve stenosis. MV peak gradient, 8.2 mmHg. The mean mitral valve gradient is 2.0 mmHg. Tricuspid Valve: The tricuspid valve is normal in structure. Tricuspid valve regurgitation is severe. No evidence of tricuspid stenosis. Aortic Valve: The aortic valve is normal in structure. There is moderate calcification of the aortic valve. There is moderate thickening of the aortic valve. Aortic valve regurgitation is not visualized. Aortic valve sclerosis/calcification is present, without any evidence of aortic stenosis. Aortic valve mean gradient measures 10.5 mmHg. Aortic valve peak gradient measures 20.6 mmHg. Aortic valve area, by VTI measures 1.19 cm. Pulmonic Valve: The pulmonic valve was normal in structure. Pulmonic valve regurgitation is mild. No evidence of pulmonic stenosis. Aorta: The aortic root is normal in size and structure. Venous: The inferior vena cava is dilated in size with less than 50% respiratory variability, suggesting right atrial pressure of 15 mmHg. IAS/Shunts: No atrial level shunt detected by color flow Doppler. Additional Comments: A device lead is visualized in the right ventricle.  LEFT VENTRICLE PLAX 2D LVIDd:         5.30 cm LVIDs:         3.70 cm LV PW:         1.50 cm LV IVS:        1.40 cm LVOT diam:     2.20 cm LV SV:         55 LV SV Index:   26 LVOT Area:     3.80 cm  RIGHT VENTRICLE            IVC RV Basal diam:  4.30 cm    IVC diam: 2.80 cm RV Mid diam:    3.30 cm RV S prime:     7.83 cm/s TAPSE (M-mode): 1.4 cm LEFT ATRIUM            Index        RIGHT ATRIUM  Index LA diam:      4.80 cm  2.26 cm/m   RA Area:     31.70 cm LA Vol (A2C): 66.0 ml  31.08 ml/m  RA Volume:   96.10 ml  45.25 ml/m LA Vol (A4C): 105.0 ml 49.44 ml/m   AORTIC VALVE AV Area (Vmax):    1.28 cm AV Area (Vmean):   1.13 cm AV Area (VTI):     1.19 cm AV Vmax:           226.67 cm/s AV Vmean:          149.500 cm/s AV VTI:            0.464 m AV Peak Grad:      20.6 mmHg AV Mean Grad:      10.5 mmHg LVOT Vmax:         76.10 cm/s LVOT Vmean:        44.500 cm/s LVOT VTI:          0.145 m LVOT/AV VTI ratio: 0.31  AORTA Ao Root diam: 3.40 cm Ao Asc diam:  3.80 cm MITRAL VALVE                  TRICUSPID VALVE MV Area VTI:  1.80 cm        TR Peak grad:   23.8 mmHg MV Peak grad: 8.2 mmHg        TR Vmax:        244.00 cm/s MV Mean grad: 2.0 mmHg MV Vmax:      1.43 m/s        SHUNTS MV Vmean:     54.5 cm/s       Systemic VTI:  0.14 m MR Peak grad:    89.9 mmHg    Systemic Diam: 2.20 cm MR Mean grad:    66.0 mmHg MR Vmax:         474.00 cm/s MR Vmean:        391.0 cm/s MR PISA:         2.26 cm MR PISA Eff ROA: 19 mm MR PISA Radius:  0.60 cm Candee Furbish MD Electronically signed by Candee Furbish MD Signature Date/Time: 05/10/2022/2:31:14 PM    Final    US THORACENTESIS ASP PLEURAL SPACE W/IMG GUIDE  Result Date: 05/10/2022 INDICATION: Pleural effusion, SOB EXAM: ULTRASOUND GUIDED RIGHT THORACENTESIS MEDICATIONS: None. COMPLICATIONS: None immediate. PROCEDURE: An ultrasound guided thoracentesis was thoroughly discussed with the patient and questions answered. The benefits, risks, alternatives and complications were also discussed. The patient understands and wishes to proceed with the procedure. Written consent was obtained. Ultrasound was performed to localize and mark an adequate pocket of fluid in the right chest. The area was then prepped and draped in the normal sterile fashion. 1% Lidocaine was used for local anesthesia. Under ultrasound guidance a 6 Fr Safe-T-Centesis catheter was introduced. Thoracentesis was performed. The catheter was removed and a dressing applied. FINDINGS: A total of approximately 1.2L of pleural fluid was removed. Samples were sent to the laboratory  as requested by the clinical team. IMPRESSION: Successful ultrasound guided right thoracentesis yielding 1.2L of pleural fluid. Read and performed by: Alexandria Lodge, PA-C Electronically Signed   By: Sandi Mariscal M.D.   On: 05/10/2022 10:31   DG CHEST PORT 1 VIEW  Result Date: 05/10/2022 CLINICAL DATA:  Status post thoracentesis EXAM: PORTABLE CHEST 1 VIEW COMPARISON:  Radiograph 05/10/2022 FINDINGS: Unchanged enlarged cardiac silhouette with dual chamber pacemaker leads. Decreased right pleural  effusion with trace residual fluid and basilar atelectasis. Stable small left pleural effusion. Improving pulmonary edema with persistent lower lung airspace disease and small left pleural effusion. No pneumothorax. No acute osseous abnormality. IMPRESSION: Decreased right pleural effusion after thoracentesis. No pneumothorax. Improving pulmonary edema with persistent lower lung airspace disease and small left pleural effusion. Electronically Signed   By: Maurine Simmering M.D.   On: 05/10/2022 10:20   DG Chest 2 View  Result Date: 05/10/2022 CLINICAL DATA:  Follow-up pneumonia. EXAM: CHEST - 2 VIEW COMPARISON:  05/09/2022 and older studies. FINDINGS: Cardiac silhouette is enlarged, stable. Bilateral mid and lower lung zone airspace opacities are without significant change from the previous day's exam. Moderate bilateral pleural effusions, also stable. No pneumothorax. IMPRESSION: 1. No significant change from the previous day's exam. 2. Cardiomegaly, airspace lung opacities and bilateral effusions suggests congestive heart failure. Electronically Signed   By: Lajean Manes M.D.   On: 05/10/2022 08:00   CT Chest Wo Contrast  Result Date: 05/09/2022 CLINICAL DATA:  Pneumonia. EXAM: CT CHEST WITHOUT CONTRAST TECHNIQUE: Multidetector CT imaging of the chest was performed following the standard protocol without IV contrast. RADIATION DOSE REDUCTION: This exam was performed according to the departmental dose-optimization  program which includes automated exposure control, adjustment of the mA and/or kV according to patient size and/or use of iterative reconstruction technique. COMPARISON:  11/24/2021 FINDINGS: Cardiovascular: Heart is enlarged. Coronary artery calcification is evident. Moderate atherosclerotic calcification is noted in the wall of the thoracic aorta. Focal dissection, small saccular aneurysm stable appearance or calcified penetrating ulcer/ulcerated plaque in the descending thoracic aorta is stable. Ascending thoracic aorta measures 4.1 cm diameter. Mediastinum/Nodes: Scattered upper normal mediastinal lymph nodes again noted. No evidence for gross hilar lymphadenopathy although assessment is limited by the lack of intravenous contrast on the current study. The esophagus has normal imaging features. Food debris in the esophagus may be related to dysmotility or reflux. Similar appearance small axillary lymph nodes bilaterally. Lungs/Pleura: Centrilobular emphsyema noted. Interval development of right lower lobe collapse/consolidation with moderate to large right pleural effusion. Patchy airspace disease noted left base with small left pleural effusion. Upper Abdomen: Probable left hepatic cyst. Calcified granulomata noted in the spleen. Musculoskeletal: No worrisome lytic or sclerotic osseous abnormality. IMPRESSION: 1. Interval development of right lower lobe collapse/consolidation with moderate to large right pleural effusion. 2. Patchy airspace disease left base with small left pleural effusion. 3. Of the thoracic aortic aneurysm measuring up to 4.1 cm diameter in the ascending segment. Recommend annual imaging followup by CTA or MRA. This recommendation follows 2010 ACCF/AHA/AATS/ACR/ASA/SCA/SCAI/SIR/STS/SVM Guidelines for the Diagnosis and Management of Patients with Thoracic Aortic Disease. Circulation. 2010; 121: Y865-H846. Aortic aneurysm NOS (ICD10-I71.9) . 4. Aortic Atherosclerosis (ICD10-I70.0) and  Emphysema (ICD10-J43.9). Electronically Signed   By: Misty Stanley M.D.   On: 05/09/2022 16:50   CT HEAD WO CONTRAST (5MM)  Result Date: 05/09/2022 CLINICAL DATA:  Mental status changes. EXAM: CT HEAD WITHOUT CONTRAST TECHNIQUE: Contiguous axial images were obtained from the base of the skull through the vertex without intravenous contrast. RADIATION DOSE REDUCTION: This exam was performed according to the departmental dose-optimization program which includes automated exposure control, adjustment of the mA and/or kV according to patient size and/or use of iterative reconstruction technique. COMPARISON:  None. FINDINGS: Brain: There is no evidence for acute hemorrhage, hydrocephalus, mass lesion, or abnormal extra-axial fluid collection. No definite CT evidence for acute infarction. Diffuse loss of parenchymal volume is consistent with atrophy. Patchy low attenuation in  the deep hemispheric and periventricular white matter is nonspecific, but likely reflects chronic microvascular ischemic demyelination. Age indeterminate lacunar infarcts seen in the right basal ganglia. Vascular: No hyperdense vessel or unexpected calcification. Skull: No evidence for fracture. No worrisome lytic or sclerotic lesion. Sinuses/Orbits: Chronic mucosal thickening noted left maxillary sinus. Visualized portions of the globes and intraorbital fat are unremarkable. Other: None. IMPRESSION: 1. No acute intracranial abnormality. 2. Age-indeterminate lacunar infarcts in the right basal ganglia. 3. Atrophy with chronic small vessel ischemic disease. Electronically Signed   By: Misty Stanley M.D.   On: 05/09/2022 16:45   DG Chest Port 1 View  Result Date: 05/09/2022 CLINICAL DATA:  Pneumonia.  Wheezing. EXAM: PORTABLE CHEST 1 VIEW COMPARISON:  May 01, 2022 FINDINGS: Stable cardiomegaly. The hila and mediastinum are normal. No pneumothorax. Bilateral pleural effusions, right greater than left, more pronounced in the interval. Opacities  are identified in the mid lower lungs. No other acute abnormalities. IMPRESSION: 1. Cardiomegaly and pleural effusions. The pleural effusions are larger in the interval and larger on the right than the left. Opacities underlying the effusions may represent atelectasis. 2. Developing pulmonary edema not excluded. Electronically Signed   By: Dorise Bullion III M.D.   On: 05/09/2022 13:38   DG Chest 2 View  Result Date: 05/01/2022 CLINICAL DATA:  Shortness of breath. EXAM: CHEST - 2 VIEW COMPARISON:  CT chest without contrast 11/24/2021 FINDINGS: The heart is enlarged. Atherosclerotic changes are present at the aorta. Bilateral pleural effusions are present, right greater than left. Bibasilar airspace disease likely reflects atelectasis. The upper lungs are clear. Pacing wires are stable. Visualized soft tissues and bony thorax are otherwise unremarkable. IMPRESSION: 1. Cardiomegaly without failure. 2. Bilateral pleural effusions, right greater than left. 3. Bibasilar airspace disease likely reflects atelectasis. Infection is not excluded. Electronically Signed   By: San Morelle M.D.   On: 05/01/2022 20:03   PERIPHERAL VASCULAR CATHETERIZATION  Result Date: 04/24/2022 DATE OF SERVICE: 04/24/2022  PATIENT:  Jeneen Rinks Levan  88 y.o. male  PRE-OPERATIVE DIAGNOSIS:  Atherosclerosis of native arteries of left lower extremity causing ulceration  POST-OPERATIVE DIAGNOSIS:  Same  PROCEDURE:  1) Ultrasound guided right common femoral artery access 2) Aortogram 3) Left lower extremity angiogram with third order cannulation  SURGEON:  Yevonne Aline. Stanford Breed, MD  ASSISTANT: none  ANESTHESIA:   local  ESTIMATED BLOOD LOSS: minimal  LOCAL MEDICATIONS USED:  LIDOCAINE  COUNTS: confirmed correct.  PATIENT DISPOSITION:  PACU - hemodynamically stable.  Delay start of Pharmacological VTE agent (>24hrs) due to surgical blood loss or risk of bleeding: no  INDICATION FOR PROCEDURE: Oskar Cretella is a 88 y.o. male with left foot  ulceration with non-invasive evidence of peripheral arterial disease. After careful discussion of risks, benefits, and alternatives the patient was offered angiography. The patient understood and wished to proceed.  OPERATIVE FINDINGS: Terminal aorta and iliac arteries: Poorly visualized, but widely patent. Small left common iliac aneurysm.  Left lower extremity: Common femoral artery: widely patent Profunda femoris artery: widely patent Superficial femoral artery: widely patent Popliteal artery: widely patent Anterior tibial artery: occluded Tibioperoneal trunk: widely patent Peroneal artery: widely patent to ankle. Arborization about the ankle. Posterior tibial artery: occluded Pedal circulation: small vessel disease about the foot and ankle.  GLASS score. N/A. Inline flow to the ankle.  WIfI score. 1 / 3 / 1. Clinical stage 3.  DESCRIPTION OF PROCEDURE: After identification of the patient in the pre-operative holding area, the patient was transferred to the  operating room. The patient was positioned supine on the operating room table. The groins was prepped and draped in standard fashion. A surgical pause was performed confirming correct patient, procedure, and operative location.  The right groin was anesthetized with subcutaneous injection of 1% lidocaine. Using ultrasound guidance, the right common femoral artery was accessed with micropuncture technique. Fluoroscopy was used to confirm cannulation over the femoral head. The 44F sheath was upsized to 6F.  A Benson wire was advanced into the distal aorta. Over the wire an omni flush catheter was advanced to the level of L2. Aortogram was performed - see above for details.  The left common iliac artery was selected with an omniflush catheter and glidewire guidewire. The wire was advanced into the common femoral artery. Over the wire the omni flush catheter was advanced into the external iliac artery. Selective angiography was performed - see above for details.   The sheath was left in place to be removed in the recovery area.  Upon completion of the case instrument and sharps counts were confirmed correct. The patient was transferred to the PACU in good condition. I was present for all portions of the procedure.  PLAN: Small vessel disease in the foot. Patient optimized from a vascular standpoint. Recommend best medical therapy for peripheral arterial disease (ASA, statin, etc.).  Yevonne Aline. Stanford Breed, MD [facility] Phone Number: [phone] 04/24/2022 1:18 PM      TODAY-DAY OF DISCHARGE:  Subjective:   Severiano Gilbert today remains unresponsive/sleeping. Objective:   Blood pressure (!) 113/51, pulse (!) 58, temperature (!) 97.5 F (36.4 C), temperature source Oral, resp. rate 14, height _0  (1.88 m), weight 117.9 kg, SpO2 96 %.  Intake/Output Summary (Last 24 hours) at 05/24/2022 1047 Last data filed at 05/23/2022 1700 Gross per 24 hour  Intake 0 ml  Output 975 ml  Net -975 ml   Filed Weights   05/22/22 1227  Weight: 117.9 kg    Exam: Sleeping comfortably-not in any distress.   PERTINENT RADIOLOGIC STUDIES: DG Chest 1 View  Result Date: 05/22/2022 CLINICAL DATA:  Shortness of breath. EXAM: CHEST  1 VIEW COMPARISON:  AP chest 05/10/2022, chest two views 05/10/2022, CT chest 05/09/2022 FINDINGS: Left chest wall cardiac pacer with leads overlying the right atrium and right ventricle. Cardiac silhouette is again moderately to markedly enlarged. Mediastinal contours are within normal limits. Moderate calcification within the aortic arch. Moderate right and small left pleural effusions appear similar to the two views of the chest 05/10/2022 study at 6:28 a.m. There was less opacification from pleural fluid on the 05/10/2022 AP view of the chest at 9:58 a.m. following interval thoracentesis. Moderate bibasilar heterogeneous airspace opacities. No pneumothorax is seen. No acute skeletal abnormality. Moderate  multilevel degenerative bridging osteophytes of the thoracic spine. IMPRESSION: 1. Moderate right and small left pleural effusions. This appears very similar to the 05/10/2022 radiographs prior to thoracentesis later that day. 2. Bibasilar heterogeneous airspace opacities, likely atelectasis. Cannot exclude underlying pneumonia. Electronically Signed   By: Yvonne Kendall M.D.   On: 05/22/2022 13:32     PERTINENT LAB RESULTS: CBC: Recent Labs    05/22/22 1251 05/23/22 0538  WBC 5.5 4.7  HGB 8.8* 8.3*  HCT 28.0* 26.5*  PLT 168 162   CMET CMP     Component Value Date/Time   NA 148 (H) 05/24/2022 0437   NA 140 03/25/2022 1240   K 3.3 (L) 05/24/2022 0437   CL 107 05/24/2022  0437   CO2 28 05/24/2022 0437   GLUCOSE 87 05/24/2022 0437   BUN 33 (H) 05/24/2022 0437   BUN 48 (H) 03/25/2022 1240   CREATININE 1.93 (H) 05/24/2022 0437   CREATININE 2.71 (H) 11/10/2021 1342   CALCIUM 10.7 (H) 05/24/2022 0437   PROT 5.5 (L) 05/23/2022 0538   ALBUMIN 2.6 (L) 05/23/2022 0538   AST 22 05/23/2022 0538   AST 17 11/10/2021 1342   ALT 13 05/23/2022 0538   ALT 12 11/10/2021 1342   ALKPHOS 143 (H) 05/23/2022 0538   BILITOT 0.9 05/23/2022 0538   BILITOT 0.5 11/10/2021 1342   GFRNONAA 32 (L) 05/24/2022 0437   GFRNONAA 22 (L) 11/10/2021 1342   GFRAA 35 (L) 10/24/2020 1141    GFR Estimated Creatinine Clearance: 34.7 mL/min (A) (by C-G formula based on SCr of 1.93 mg/dL (H)). No results for input(s): "LIPASE", "AMYLASE" in the last 72 hours. No results for input(s): "CKTOTAL", "CKMB", "CKMBINDEX", "TROPONINI" in the last 72 hours. Invalid input(s): "POCBNP" No results for input(s): "DDIMER" in the last 72 hours. No results for input(s): "HGBA1C" in the last 72 hours. No results for input(s): "CHOL", "HDL", "LDLCALC", "TRIG", "CHOLHDL", "LDLDIRECT" in the last 72 hours. No results for input(s): "TSH", "T4TOTAL", "T3FREE", "THYROIDAB" in the last 72 hours.  Invalid input(s): "FREET3" No results  for input(s): "VITAMINB12", "FOLATE", "FERRITIN", "TIBC", "IRON", "RETICCTPCT" in the last 72 hours. Coags: No results for input(s): "INR" in the last 72 hours.  Invalid input(s): "PT" Microbiology: Recent Results (from the past 240 hour(s))  Urine Culture     Status: Abnormal (Preliminary result)   Collection Time: 05/22/22 12:56 PM   Specimen: Urine, Clean Catch  Result Value Ref Range Status   Specimen Description URINE, CLEAN CATCH  Final   Special Requests   Final    NONE Performed at [facility], [place] [address] [place], [place] [address]    Culture >=100,000 COLONIES/mL GRAM NEGATIVE RODS (A)  Final   Report Status PENDING  Incomplete    FURTHER DISCHARGE INSTRUCTIONS:  Get Medicines reviewed and adjusted: Please take all your medications with you for your next visit with your Primary MD  Laboratory/radiological data: Please request your Primary MD to go over all hospital tests and procedure/radiological results at the follow up, please ask your Primary MD to get all Hospital records sent to his/her office.  In some cases, they will be blood work, cultures and biopsy results pending at the time of your discharge. Please request that your primary care M.D. goes through all the records of your hospital data and follows up on these results.  Also Note the following: If you experience worsening of your admission symptoms, develop shortness of breath, life threatening emergency, suicidal or homicidal thoughts you must seek medical attention immediately by calling 911 or calling your MD immediately  if symptoms less severe.  You must read complete instructions/literature along with all the possible adverse reactions/side effects for all the Medicines you take and that have been prescribed to you. Take any new Medicines after you have completely understood and accpet all the possible adverse reactions/side effects.   Do not drive when taking Pain medications or sleeping  medications (Benzodaizepines)  Do not take more than prescribed Pain, Sleep and Anxiety Medications. It is not advisable to combine anxiety,sleep and pain medications without talking with your primary care practitioner  Special Instructions: If you have smoked or chewed Tobacco  in the last 2 yrs please stop smoking, stop  any regular Alcohol  and or any Recreational drug use.  Wear Seat belts while driving.  Please note: You were cared for by a hospitalist during your hospital stay. Once you are discharged, your primary care physician will handle any further medical issues. Please note that NO REFILLS for any discharge medications will be authorized once you are discharged, as it is imperative that you return to your primary care physician (or establish a relationship with a primary care physician if you do not have one) for your post hospital discharge needs so that they can reassess your need for medications and monitor your lab values.  Total Time spent coordinating discharge including counseling, education and face to face time equals greater than 30 minutes.  SignedOren Binet 05/24/2022 10:47 AM

## 2022-05-24 NOTE — Progress Notes (Signed)
OT Cancellation Note  Patient Details Name: Steven Williams MRN: [id number] DOB: March 31, 1930   Cancelled Treatment:    Reason Eval/Treat Not Completed: Other (comment) (Pending palliative consult today at 10am, will follow up for OT eval as appropriate.)  Lynnda Child, OTD, OTR/L Acute Rehab [phone] - [place] 05/24/2022, 8:40 AM

## 2022-05-24 NOTE — TOC Transition Note (Signed)
Transition of Care [facility]) - CM/SW Discharge Note   Patient Details  Name: Steven Williams MRN: [id number] Date of Birth: 1929-12-20  [facility] CM/SW Contact:  Amador Cunas, LCSW Phone Number: 05/24/2022, 2:26 PM   Clinical Narrative:   Pt for dc to [facility] today. Pt's family aware of dc and report agreeable. PTAR arranged for transport and RN provided with number for report. SW signing off at dc.   Wandra Feinstein, MSW, LCSW [phone] (coverage)      Final next level of care: [place] Barriers to Discharge: No Barriers Identified   Patient Goals and CMS Choice     Choice offered to / list presented to : Adult Children  Discharge Placement              Patient chooses bed at: Other - please specify in the comment section below: ([place]) Patient to be transferred to facility by: [place] Name of family member notified: [name]/dtr Patient and family notified of of transfer: 05/24/22  Discharge Plan and Services                                     Social Determinants of Health (SDOH) Interventions     Readmission Risk Interventions    05/15/2022   11:34 AM 04/21/2022    9:52 AM  Readmission Risk Prevention Plan  Transportation Screening Complete Complete  PCP or Specialist Appt within 3-5 Days Complete   HRI or [place] Complete   Social Work Consult for [place] Planning/Counseling Complete   Medication Review [employer] photographer) Complete Complete  PCP or Specialist appointment within 3-5 days of discharge  Complete  HRI or [place]  Complete  SW Recovery Care/Counseling Consult  Complete  Palliative Care Screening  Complete  [place]  Not Applicable

## 2022-05-24 NOTE — Progress Notes (Signed)
Per palliative care APP, pt's family toured [facility] this morning and are requesting this facility for hospice home placement. Shanita with [facility] aware of referral and will f/u once their provider has reviewed information. Pt for dc today pending [facility] acceptance and bed availability.   Wandra Feinstein, MSW, LCSW [phone] (coverage)

## 2022-05-24 NOTE — Progress Notes (Signed)
PROGRESS NOTE        PATIENT DETAILS Name: Steven Williams Age: 88 y.o. Sex: male Date of Birth: May 13, 1930 Admit Date: 05/22/2022 Admitting Physician Evalee Mutton Kristeen Mans, MD KKX:[id number], Emily Filbert., MD  Brief Summary: Patient is a 88 y.o.  male with history of HFpEF, chronic right pleural effusion (transudative), CKD stage IV, recurrent epistaxis-who was recently hospitalized for HFpEF exacerbation-subsequently sent to SNF-presented back to the ED with confusion, poor oral intake and severe failure to thrive syndrome.  Significant events: 9/2-9/8>> hospitalization for HFpEF exacerbation/underwent thoracocentesis-discharged to SNF. 9/15>> presented to ED with confusion-poor oral intake-admit to [facility]  Significant studies: 9/15>> CXR: Moderate right pleural effusion-bibasilar atelectasis.  Significant microbiology data: 9/15>> urine culture: Pending  Procedures: None  Consults: Palliative care  Subjective: Sleeping-did not disturb-son at bedside-family leaning towards residential hospice placement.  Patient appeared very comfortable.  Objective: Vitals: Blood pressure (!) 113/51, pulse (!) 58, temperature (!) 97.5 F (36.4 C), temperature source Oral, resp. rate 14, height '6\' 2"'$  (1.88 m), weight 117.9 kg, SpO2 96 %.   Exam: Gen Exam: Sleeping comfortably.  Not in any distress.   Pertinent Labs/Radiology:    Latest Ref Rng & Units 05/23/2022    5:38 AM 05/22/2022   12:51 PM 05/11/2022    2:03 AM  CBC  WBC 4.0 - 10.5 K/uL 4.7  5.5  9.1   Hemoglobin 13.0 - 17.0 g/dL 8.3  8.8  8.7   Hematocrit 39.0 - 52.0 % 26.5  28.0  26.7   Platelets 150 - 400 K/uL 162  168  169     Lab Results  Component Value Date   NA 148 (H) 05/24/2022   K 3.3 (L) 05/24/2022   CL 107 05/24/2022   CO2 28 05/24/2022      Assessment/Plan: Acute metabolic encephalopathy with severe failure to thrive syndrome/poor oral intake: Continues to slowly decline-hardly any oral  intake for the past several days-suspect at end-of-life-palliative care discussion in progress with family-but family leaning towards starting full comfort measures/residential hospice.  Palliative care team meeting with family at 57 AM this morning.  Will await further recommendations.  Supportive care in the interim.  Chronic right pleural effusion: Transudative-unlikely related to HFpEF.  Asymptomatic-lying flat-no benefit from thoracocentesis.  Risks outweigh benefits.  Family considering residential hospice-see above.  Chronic HFpEF: Volume status is stable-suspect he may actually be on the dry side.  No plan to restart diuretics.  HTN: BP stable-very poor oral intake-sleeping-stop all antihypertensives and monitor.    HLD: Poor oral intake-likely will transition to comfort care soon-stop statin.   CKD stage IV: Creatinine close to baseline.  Normocytic anemia: Due to CKD-no indication to transfuse.  Follow periodically.  Hypernatremia: Stop Lasix-monitor-likely due to dehydration/poor oral intake.  Recurrent epistaxis: No evidence of recurrence-off anticoagulation.  History of antiphospholipid syndrome: Not on anticoagulation due to recurrent epistaxis.  Hypothyroidism: Hardly any oral intake-sleeping-no role to continue Synthroid-family leaning towards comfort measures.  RLS: Stopping Requip-poor oral intake.  BPH: On Flomax-watch for urinary retention.  Unstageable sacral decubitus ulcer/right heel ulcer: All present prior to hospitalization-await wound care evaluation.  Palliative care: DNR in place.  Poor overall prognosis-discussed with daughter-[name] over the phone on 9/16 long discussion-and subsequently with son at bedside on 9/17.  He is at end-of-life-and very appropriate for residential hospice.  He has not had any  significant oral intake for the past several days.   Pressure Ulcer: Pressure Injury 05/09/22 Heel Right Deep Tissue Pressure Injury - Purple or maroon  localized area of discolored intact skin or blood-filled blister due to damage of underlying soft tissue from pressure and/or shear. (Active)  05/09/22 2100  Location: Heel  Location Orientation: Right  Staging: Deep Tissue Pressure Injury - Purple or maroon localized area of discolored intact skin or blood-filled blister due to damage of underlying soft tissue from pressure and/or shear.  Wound Description (Comments):   Present on Admission: Yes  Dressing Type Gauze (Comment) 05/24/22 0029    Obesity: Estimated body mass index is 33.38 kg/m as calculated from the following:   Height as of this encounter: '6\' 2"'$  (1.88 m).   Weight as of this encounter: 117.9 kg.   Code status:   Code Status: DNR   DVT Prophylaxis: SCDs Start: 05/23/22 0103   Family Communication: Daughter-[name]-[phone]-updated over the phone on 9/16-son at bedside on 9/17.   Disposition Plan: Status is: Observation The patient will require care spanning > 2 midnights and should be moved to inpatient because: Severe failure to thrive syndrome-poor oral intake-confusion-not yet stable for discharge.  Awaiting further goals of care discussion with family.   Planned Discharge Destination:Skilled nursing facility   Diet: Diet Order             DIET DYS 2 Room service appropriate? Yes; Fluid consistency: Thin  Diet effective now                     Antimicrobial agents: Anti-infectives (From admission, onward)    None        MEDICATIONS: Scheduled Meds:  aspirin EC  81 mg Oral Daily   atorvastatin  40 mg Oral Daily   carvedilol  3.125 mg Oral BID WC   glycopyrrolate  0.1 mg Intravenous TID   levothyroxine  100 mcg Oral Daily   pantoprazole  40 mg Oral Daily   rOPINIRole  3 mg Oral QHS   tamsulosin  0.4 mg Oral Daily   Continuous Infusions: PRN Meds:.acetaminophen **OR** acetaminophen, LORazepam, morphine injection, morphine CONCENTRATE, ondansetron **OR** ondansetron (ZOFRAN) IV   I  have personally reviewed following labs and imaging studies  LABORATORY DATA: CBC: Recent Labs  Lab 05/22/22 1251 05/23/22 0538  WBC 5.5 4.7  NEUTROABS 3.4 2.9  HGB 8.8* 8.3*  HCT 28.0* 26.5*  MCV 104.5* 103.9*  PLT 168 162     Basic Metabolic Panel: Recent Labs  Lab 05/22/22 1251 05/23/22 0538 05/24/22 0437  NA 145 146* 148*  K 3.4* 3.1* 3.3*  CL 107 106 107  CO2 '27 28 28  '$ GLUCOSE 104* 95 87  BUN 38* 36* 33*  CREATININE 1.95* 1.87* 1.93*  CALCIUM 11.6* 11.3* 10.7*  MG  --  1.8  --      GFR: Estimated Creatinine Clearance: 34.7 mL/min (A) (by C-G formula based on SCr of 1.93 mg/dL (H)).  Liver Function Tests: Recent Labs  Lab 05/23/22 0538  AST 22  ALT 13  ALKPHOS 143*  BILITOT 0.9  PROT 5.5*  ALBUMIN 2.6*    No results for input(s): "LIPASE", "AMYLASE" in the last 168 hours. No results for input(s): "AMMONIA" in the last 168 hours.  Coagulation Profile: No results for input(s): "INR", "PROTIME" in the last 168 hours.  Cardiac Enzymes: No results for input(s): "CKTOTAL", "CKMB", "CKMBINDEX", "TROPONINI" in the last 168 hours.  BNP (last 3 results) No results for  input(s): "PROBNP" in the last 8760 hours.  Lipid Profile: No results for input(s): "CHOL", "HDL", "LDLCALC", "TRIG", "CHOLHDL", "LDLDIRECT" in the last 72 hours.  Thyroid Function Tests: No results for input(s): "TSH", "T4TOTAL", "FREET4", "T3FREE", "THYROIDAB" in the last 72 hours.  Anemia Panel: No results for input(s): "VITAMINB12", "FOLATE", "FERRITIN", "TIBC", "IRON", "RETICCTPCT" in the last 72 hours.  Urine analysis:    Component Value Date/Time   COLORURINE YELLOW 05/22/2022 1256   APPEARANCEUR HAZY (A) 05/22/2022 1256   LABSPEC 1.010 05/22/2022 1256   PHURINE 5.0 05/22/2022 1256   GLUCOSEU NEGATIVE 05/22/2022 1256   HGBUR NEGATIVE 05/22/2022 1256   [place] 05/22/2022 1256   KETONESUR NEGATIVE 05/22/2022 1256   PROTEINUR NEGATIVE 05/22/2022 1256   NITRITE  NEGATIVE 05/22/2022 1256   LEUKOCYTESUR LARGE (A) 05/22/2022 1256    Sepsis Labs: Lactic Acid, Venous    Component Value Date/Time   LATICACIDVEN 1.3 05/01/2022 1330    MICROBIOLOGY: Recent Results (from the past 240 hour(s))  Urine Culture     Status: Abnormal (Preliminary result)   Collection Time: 05/22/22 12:56 PM   Specimen: Urine, Clean Catch  Result Value Ref Range Status   Specimen Description URINE, CLEAN CATCH  Final   Special Requests   Final    NONE Performed at [facility], [place] [address], [place], [place] [address]    Culture >=100,000 COLONIES/mL GRAM NEGATIVE RODS (A)  Final   Report Status PENDING  Incomplete    RADIOLOGY STUDIES/RESULTS: DG Chest 1 View  Result Date: 05/22/2022 CLINICAL DATA:  Shortness of breath. EXAM: CHEST  1 VIEW COMPARISON:  AP chest 05/10/2022, chest two views 05/10/2022, CT chest 05/09/2022 FINDINGS: Left chest wall cardiac pacer with leads overlying the right atrium and right ventricle. Cardiac silhouette is again moderately to markedly enlarged. Mediastinal contours are within normal limits. Moderate calcification within the aortic arch. Moderate right and small left pleural effusions appear similar to the two views of the chest 05/10/2022 study at 6:28 a.m. There was less opacification from pleural fluid on the 05/10/2022 AP view of the chest at 9:58 a.m. following interval thoracentesis. Moderate bibasilar heterogeneous airspace opacities. No pneumothorax is seen. No acute skeletal abnormality. Moderate multilevel degenerative bridging osteophytes of the thoracic spine. IMPRESSION: 1. Moderate right and small left pleural effusions. This appears very similar to the 05/10/2022 radiographs prior to thoracentesis later that day. 2. Bibasilar heterogeneous airspace opacities, likely atelectasis. Cannot exclude underlying pneumonia. Electronically Signed   By: Yvonne Kendall M.D.   On: 05/22/2022 13:32     LOS: 1 day   Oren Binet,  MD  [facility]    To contact the attending provider between 7A-7P or the covering provider during after hours 7P-7A, please log into the web site [email or web address] and access using universal [place] password for that web site. If you do not have the password, please call the hospital operator.  05/24/2022, 9:43 AM

## 2022-05-24 NOTE — Progress Notes (Addendum)
Manufacturing engineer [facility]) Hospital Liaison Note  Referral received for patient/family interest in [facility]. Chart under review by [facility] physician.   Hospice eligibility confirmed.   [facility]. Unit RN please call report to [phone] prior to patient leaving the unit. Please send signed DNR and paperwork with patient.   Please leave all IV access and ports in place.    Please call with any questions or concerns. Thank you  Roselee Nova, [facility] Liaison [phone]

## 2022-05-24 NOTE — Plan of Care (Signed)
  Problem: Education: Goal: Understanding of CV disease, CV risk reduction, and recovery process will improve Outcome: Adequate for Discharge Goal: Individualized Educational Video(s) Outcome: Adequate for Discharge   Problem: Activity: Goal: Ability to return to baseline activity level will improve Outcome: Adequate for Discharge   Problem: Cardiovascular: Goal: Ability to achieve and maintain adequate cardiovascular perfusion will improve Outcome: Adequate for Discharge Goal: Vascular access site(s) Level 0-1 will be maintained Outcome: Adequate for Discharge   Problem: Health Behavior/Discharge Planning: Goal: Ability to safely manage health-related needs after discharge will improve Outcome: Adequate for Discharge   Problem: Education: Goal: Knowledge of General Education information will improve Description: Including pain rating scale, medication(s)/side effects and non-pharmacologic comfort measures Outcome: Adequate for Discharge   Problem: Health Behavior/Discharge Planning: Goal: Ability to manage health-related needs will improve Outcome: Adequate for Discharge   Problem: Clinical Measurements: Goal: Ability to maintain clinical measurements within normal limits will improve Outcome: Adequate for Discharge Goal: Will remain free from infection Outcome: Adequate for Discharge Goal: Diagnostic test results will improve Outcome: Adequate for Discharge Goal: Respiratory complications will improve Outcome: Adequate for Discharge Goal: Cardiovascular complication will be avoided Outcome: Adequate for Discharge   Problem: Activity: Goal: Risk for activity intolerance will decrease Outcome: Adequate for Discharge   Problem: Nutrition: Goal: Adequate nutrition will be maintained Outcome: Adequate for Discharge   Problem: Coping: Goal: Level of anxiety will decrease Outcome: Adequate for Discharge   Problem: Elimination: Goal: Will not experience complications  related to bowel motility Outcome: Adequate for Discharge Goal: Will not experience complications related to urinary retention Outcome: Adequate for Discharge   Problem: Pain Managment: Goal: General experience of comfort will improve Outcome: Adequate for Discharge   Problem: Safety: Goal: Ability to remain free from injury will improve Outcome: Adequate for Discharge   Problem: Skin Integrity: Goal: Risk for impaired skin integrity will decrease Outcome: Adequate for Discharge   Problem: Education: Goal: Knowledge of the prescribed therapeutic regimen will improve Outcome: Adequate for Discharge   Problem: Coping: Goal: Ability to identify and develop effective coping behavior will improve Outcome: Adequate for Discharge   Problem: Clinical Measurements: Goal: Quality of life will improve Outcome: Adequate for Discharge   Problem: Respiratory: Goal: Verbalizations of increased ease of respirations will increase Outcome: Adequate for Discharge   Problem: Role Relationship: Goal: Family's ability to cope with current situation will improve Outcome: Adequate for Discharge Goal: Ability to verbalize concerns, feelings, and thoughts to partner or family member will improve Outcome: Adequate for Discharge   Problem: Pain Management: Goal: Satisfaction with pain management regimen will improve Outcome: Adequate for Discharge

## 2022-05-24 NOTE — Progress Notes (Signed)
Palliative Medicine Inpatient Follow Up Note HPI: Patient is a 88 y.o.  male with history of chronic right pleural effusion (transudative), CKD stage IV, recurrent epistaxis-who was recently hospitalized for HFpEF exacerbation-subsequently sent to SNF-presented back to the ED with confusion, poor oral intake and severe failure to thrive syndrome.     Palliative care asked to get involved to discuss goals of care in the setting of recurrent rehospitalization's, delirium, and high chronic disease burden.  Today's Discussion 05/24/2022  *Please note that this is a verbal dictation therefore any spelling or grammatical errors are due to the "[employer] [employer]" system interpretation.  Chart reviewed inclusive of vital signs, progress notes, laboratory results, and diagnostic images.   I assessed [facility] this morning and he was noted to be more somnolent.  Patient's son Steven Williams and daughter Steven Williams were present at bedside.  We excused herself to the conference room and had a detailed discussion regarding patient's present health state.  Patient's family are in agreement with the transition to comfort focused care. We talked about transition to comfort measures in house and what that would entail inclusive of medications to control pain, dyspnea, agitation, nausea, itching, and hiccups.  We discussed stopping all uneccessary measures such as cardiac monitoring, blood draws, needle sticks, and frequent vital signs. Utilized reflective listening throughout our time together.    They would like for him to go to [facility] as they have recently toured the facility and find it to be a good fit for [facility].  Created space and opportunity for only to explore thoughts feelings and fears regarding current medical situation.  Offered emotional support through therapeutic listening.  Questions and concerns addressed/Palliative Support Provided.   Objective Assessment: Vital Signs Vitals:   05/23/22 0739  05/24/22 0714  BP: (!) 112/54 (!) 113/51  Pulse: (!) 57 (!) 58  Resp: 16 14  Temp: (!) 97.5 F (36.4 C)   SpO2: 100% 96%    Intake/Output Summary (Last 24 hours) at 05/24/2022 1015 Last data filed at 05/23/2022 1700 Gross per 24 hour  Intake 0 ml  Output 975 ml  Net -975 ml   Last Weight  Most recent update: 05/22/2022 12:28 PM    Weight  117.9 kg (260 lb)            Gen: Elderly Caucasian male in no acute distress HEENT: Dry mucous membranes CV: Regular rate and rhythm PULM: On 2 L nasal cannula breathing is even and nonlabored ABD: soft/nontender EXT: No edema Neuro: Disoriented  SUMMARY OF RECOMMENDATIONS   DNAR/DNI  Comfort Care  Continue morphine and Ativan as needed  Plan for transition to [facility] with the bed is confirmed and available  Prognosis is Limited to days  Ongoing palliative care support  Time Spent: 65  Billing based on MDM: High  Problems Addressed: One acute or chronic illness or injury that poses a threat to life or bodily function  Amount and/or Complexity of Data: Category 3:Discussion of management or test interpretation with external physician/other qualified health care professional/appropriate source (not separately reported)  Risks: Parenteral controlled substances, Decision regarding hospitalization or escalation of hospital care, and Decision not to resuscitate or to de-escalate care because of poor prognosis ______________________________________________________________________________________ [facility] Team Team Cell Phone: [phone] Please utilize secure chat with additional questions, if there is no response within 30 minutes please call the above phone number  Palliative Medicine Team providers are available by phone from 7am to 7pm  daily and can be reached through the team cell phone.  Should this patient require assistance outside of these hours, please call the patient's  attending physician.

## 2022-05-25 LAB — URINE CULTURE: Culture: >=100000 — AB

## 2022-05-26 ENCOUNTER — Ambulatory Visit (HOSPITAL_COMMUNITY): Payer: Medicare Other

## 2022-05-28 ENCOUNTER — Inpatient Hospital Stay: Admission: RE | Admit: 2022-05-28 | Payer: Medicare Other | Source: Ambulatory Visit

## 2022-06-02 ENCOUNTER — Ambulatory Visit: Payer: Medicare Other

## 2022-06-07 NOTE — Progress Notes (Deleted)
HISTORY AND PHYSICAL     CC:  follow up. Requesting Provider:  Ginger Organ., MD  HPI: This is a 88 y.o. male who is here today for follow up for PAD.  Pt has hx of symptomatic bradycardia with pacer, CKD stage IV, chronic anemia and thrombocytopenia, thoracic aortic aneurysm followed by CT surgery, and paroxysmal atrial fibrillation.  He had been off his [facility] for severe epistaxis.  He was admitted to the emergency room on 04/18/2022 with 1 week of worsening left lower leg pain, swelling, and erythema.   He had weakness, fatigue and chills for a few days prior to admission.  He was having some worsening redness, pain and swelling of the left leg.  His study was negative for DVT in the left leg.  It was determined that the patient had left leg cellulitis.  Blood cultures were collected in the ED and the patient was started on Zosyn.  The patient was then switched to Rocephin.  They have avoided vancomycin due to the patient's AKI on CKD 4.  Vascular surgery was consulted and pt underwent angiogram on 04/24/2022 and found to have terminal aorta and iliac arteries.  He had widely patent CFA, profunda, SFA, popliteal and TPT.  The ATA was occluded and he had widely patent peroneal artery to the ankle and arborization about the ankle.  He had small vessel disease at the foot and ankle.  The PTA was occluded.  He was felt to be optimized from a vascular standpoint and recommended best medical therapy for PAD with asa, statin, etc.  Podiatry referral was recommended for outpatient foot care.    He comes in today for wound check.     The pt *** on a statin for cholesterol management.    The pt *** on an aspirin.    Other AC:  *** The pt *** on *** for hypertension.  The pt does *** have diabetes. Tobacco hx:  former  Pt does *** have family hx of AAA.  Past Medical History:  Diagnosis Date   Acute renal failure superimposed on stage 4 chronic kidney disease ([place]) 04/18/2022   Antiphospholipid  antibody positive    Aortic aneurysm (HCC)    thoracic, Dr Roxan Hockey, with h/o dissection   BPH (benign prostatic hyperplasia)    Central retinal artery occlusion    Chronic anemia    Chronic renal insufficiency    COPD (chronic obstructive pulmonary disease) (HCC)    DDD (degenerative disc disease), cervical    DJD (degenerative joint disease)    DVT (deep venous thrombosis) (HCC)    Gastric dysmotility    GERD (gastroesophageal reflux disease)    Gout    HTN (hypertension)    Hyperlipidemia    Hypertensive retinopathy    OU   Hypothyroid    Macular degeneration    Myelodysplasia (myelodysplastic syndrome) (HCC)    Paget's disease of bone    Peripheral neuropathy    Permanent atrial fibrillation (HCC)    Recurrent epistaxis 06/26/2020   Rheumatoid arthritis (HCC)    TIA (transient ischemic attack)     Past Surgical History:  Procedure Laterality Date   ABDOMINAL AORTOGRAM W/LOWER EXTREMITY Left 04/24/2022   Procedure: ABDOMINAL AORTOGRAM W/LOWER EXTREMITY;  Surgeon: Cherre Robins, MD;  Location: [facility];  Service: Cardiovascular;  Laterality: Left;   ATRIAL FIBRILLATION ABLATION  09/23/2014   Dr Encarnacion Chu in [place]     lumbar, x 2  CAROTID ENDARTERECTOMY Right 1990   CAROTID STENT     CATARACT EXTRACTION Bilateral 2010   [place]   EYE SURGERY Bilateral 2010   [facility]   LUNG DECORTICATION     VATS   PACEMAKER GENERATOR CHANGE  12/03/2011   [facility] [id number] generator change by Dr Encarnacion Chu for symptomatic bradycardia   PACEMAKER IMPLANT  2004   [facility]     Allergies  Allergen Reactions   Iodinated Contrast Media Rash    Current Outpatient Medications  Medication Sig Dispense Refill   glycopyrrolate (ROBINUL) 0.2 MG/ML injection Inject 1 mL (0.2 mg total) into the skin every 4 (four) hours as needed (excessive secretions). 1 mL    LORazepam (ATIVAN) 2 MG/ML injection Inject 0.5 mLs (1 mg total) into the vein every hour as needed for  anxiety, sedation or seizure. 1 mL 0   Morphine Sulfate (MORPHINE CONCENTRATE) 10 MG/0.5ML SOLN concentrated solution Take 0.25 mLs (5 mg total) by mouth every 4 (four) hours as needed for moderate pain or shortness of breath. 180 mL 0   Morphine Sulfate (MORPHINE, PF,) 2 MG/ML injection Inject 1 mL (2 mg total) into the vein every hour as needed (Dyspnea/Pain). 1 mL 0   No current facility-administered medications for this visit.    Family History  Problem Relation Age of Onset   Cirrhosis Mother    Stroke Father    Lung cancer Sister    Lung cancer Brother     Social History   Socioeconomic History   Marital status: Widowed    Spouse name: Not on file   Number of children: 3   Years of education: Not on file   Highest education level: Bachelor's degree (e.g., BA, AB, BS)  Occupational History   Occupation: retired    Comment: retired [employer]  Tobacco Use   Smoking status: Former    Packs/day: 2.00    Years: 12.00    Total pack years: 24.00    Types: Cigarettes    Quit date: 09/07/1962    Years since quitting: 59.7   Smokeless tobacco: Never  Vaping Use   Vaping Use: Never used  Substance and Sexual Activity   Alcohol use: Yes    Alcohol/week: 2.0 standard drinks of alcohol    Types: 2 Glasses of wine per week    Comment: daily   Drug use: Never   Sexual activity: Not Currently  Other Topics Concern   Not on file  Social History Narrative   Previously lived in [place]   03/12/20 Now lives in [place]   Retired Chief Financial Officer   Caffeine 2 a day   Social Determinants of Radio broadcast assistant Strain: Not on file  Food Insecurity: Not on file  Transportation Needs: Not on file  Physical Activity: Not on file  Stress: Not on file  Social Connections: Not on file  Intimate Partner Violence: Not on file     REVIEW OF SYSTEMS:  *** '[X]'$  denotes positive finding, '[ ]'$  denotes negative finding Cardiac  Comments:  Chest pain or chest pressure:     Shortness of breath upon exertion:    Short of breath when lying flat:    Irregular heart rhythm:        Vascular    Pain in calf, thigh, or hip brought on by ambulation:    Pain in feet at night that wakes you up from your sleep:     Blood clot in your veins:  Leg swelling:         Pulmonary    Oxygen at home:    Productive cough:     Wheezing:         Neurologic    Sudden weakness in arms or legs:     Sudden numbness in arms or legs:     Sudden onset of difficulty speaking or slurred speech:    Temporary loss of vision in one eye:     Problems with dizziness:         Gastrointestinal    Blood in stool:     Vomited blood:         Genitourinary    Burning when urinating:     Blood in urine:        Psychiatric    Major depression:         Hematologic    Bleeding problems:    Problems with blood clotting too easily:        Skin    Rashes or ulcers:        Constitutional    Fever or chills:      PHYSICAL EXAMINATION:  ***  General:  WDWN in NAD; vital signs documented above Gait: Not observed HENT: WNL, normocephalic Pulmonary: normal non-labored breathing , without wheezing Cardiac: {Desc; regular/irreg:14544} HR, {With/Without:20273} carotid bruit*** Abdomen: soft, NT; aortic pulse is *** palpable Skin: {With/Without:20273} rashes Vascular Exam/Pulses:  Right Left  Radial {Exam; arterial pulse strength 0-4:30167} {Exam; arterial pulse strength 0-4:30167}  Femoral {Exam; arterial pulse strength 0-4:30167} {Exam; arterial pulse strength 0-4:30167}  Popliteal {Exam; arterial pulse strength 0-4:30167} {Exam; arterial pulse strength 0-4:30167}  DP {Exam; arterial pulse strength 0-4:30167} {Exam; arterial pulse strength 0-4:30167}  PT {Exam; arterial pulse strength 0-4:30167} {Exam; arterial pulse strength 0-4:30167}  Peroneal *** ***   Extremities: {With/Without:20273} ischemic changes, {With/Without:20273} Gangrene , {With/Without:20273} cellulitis;  {With/Without:20273} open wounds Musculoskeletal: no muscle wasting or atrophy  Neurologic: A&O X 3 Psychiatric:  The pt has {Desc; normal/abnormal:11317::"Normal"} affect.   Non-Invasive Vascular Imaging:   ABI's/TBI's on 06/21/2022: Right:  *** - Great toe pressure: *** Left:  *** - Great toe pressure:  ***  Previous ABI's/TBI's on 04/23/2022: Right:  [place]/absent - Great toe pressure: absent Left:  [place]/absent - Great toe pressure: absent     ASSESSMENT/PLAN:: 88 y.o. male here for follow up for PAD with hx of non healing wound left foot here today for follow up.     -*** -continue *** -pt will f/u in *** with ***.   Leontine Locket, [facility] [phone]  Clinic MD:   Stanford Breed

## 2022-06-07 DEATH — deceased

## 2022-07-01 ENCOUNTER — Ambulatory Visit: Payer: BLUE CROSS/BLUE SHIELD | Admitting: Cardiology
